# Patient Record
Sex: Female | Born: 1955
Health system: Southern US, Community
[De-identification: ages and names within clinical notes are randomized; demographics above are authoritative.]

## PROBLEM LIST (undated history)

## (undated) DIAGNOSIS — F4310 Post-traumatic stress disorder, unspecified: Secondary | ICD-10-CM

## (undated) DIAGNOSIS — F419 Anxiety disorder, unspecified: Secondary | ICD-10-CM

## (undated) DIAGNOSIS — R55 Syncope and collapse: Secondary | ICD-10-CM

## (undated) DIAGNOSIS — K5790 Diverticulosis of intestine, part unspecified, without perforation or abscess without bleeding: Secondary | ICD-10-CM

## (undated) DIAGNOSIS — I639 Cerebral infarction, unspecified: Secondary | ICD-10-CM

## (undated) DIAGNOSIS — M797 Fibromyalgia: Secondary | ICD-10-CM

## (undated) DIAGNOSIS — I10 Essential (primary) hypertension: Secondary | ICD-10-CM

## (undated) DIAGNOSIS — R51 Headache: Secondary | ICD-10-CM

## (undated) DIAGNOSIS — M549 Dorsalgia, unspecified: Secondary | ICD-10-CM

## (undated) DIAGNOSIS — M1990 Unspecified osteoarthritis, unspecified site: Secondary | ICD-10-CM

## (undated) DIAGNOSIS — G459 Transient cerebral ischemic attack, unspecified: Secondary | ICD-10-CM

## (undated) DIAGNOSIS — S83209A Unspecified tear of unspecified meniscus, current injury, unspecified knee, initial encounter: Secondary | ICD-10-CM

## (undated) DIAGNOSIS — E162 Hypoglycemia, unspecified: Secondary | ICD-10-CM

## (undated) DIAGNOSIS — E7800 Pure hypercholesterolemia, unspecified: Secondary | ICD-10-CM

## (undated) DIAGNOSIS — Z9621 Cochlear implant status: Secondary | ICD-10-CM

## (undated) DIAGNOSIS — G43909 Migraine, unspecified, not intractable, without status migrainosus: Secondary | ICD-10-CM

## (undated) DIAGNOSIS — M810 Age-related osteoporosis without current pathological fracture: Secondary | ICD-10-CM

## (undated) DIAGNOSIS — G8929 Other chronic pain: Secondary | ICD-10-CM

## (undated) DIAGNOSIS — K635 Polyp of colon: Secondary | ICD-10-CM

## (undated) DIAGNOSIS — R011 Cardiac murmur, unspecified: Secondary | ICD-10-CM

## (undated) DIAGNOSIS — K5900 Constipation, unspecified: Secondary | ICD-10-CM

## (undated) DIAGNOSIS — H532 Diplopia: Secondary | ICD-10-CM

## (undated) DIAGNOSIS — F209 Schizophrenia, unspecified: Secondary | ICD-10-CM

## (undated) DIAGNOSIS — N200 Calculus of kidney: Secondary | ICD-10-CM

## (undated) DIAGNOSIS — K5792 Diverticulitis of intestine, part unspecified, without perforation or abscess without bleeding: Secondary | ICD-10-CM

## (undated) DIAGNOSIS — R569 Unspecified convulsions: Secondary | ICD-10-CM

## (undated) HISTORY — DX: Diplopia: H53.2

## (undated) HISTORY — DX: Constipation, unspecified: K59.00

## (undated) HISTORY — PX: PARATHYROIDECTOMY: SHX19

## (undated) HISTORY — DX: Unspecified osteoarthritis, unspecified site: M19.90

## (undated) HISTORY — DX: Headache: R51

## (undated) HISTORY — DX: Pure hypercholesterolemia, unspecified: E78.00

## (undated) HISTORY — DX: Diverticulitis of intestine, part unspecified, without perforation or abscess without bleeding: K57.92

## (undated) HISTORY — DX: Unspecified tear of unspecified meniscus, current injury, unspecified knee, initial encounter: S83.209A

## (undated) HISTORY — DX: Cochlear implant status: Z96.21

## (undated) HISTORY — PX: DILATION AND CURETTAGE OF UTERUS: SHX78

## (undated) HISTORY — DX: Hypercalcemia: E83.52

## (undated) HISTORY — PX: CATARACT EXTRACTION: SUR2

## (undated) HISTORY — PX: OTHER SURGICAL HISTORY: SHX169

## (undated) HISTORY — DX: Syncope and collapse: R55

## (undated) HISTORY — PX: WISDOM TOOTH EXTRACTION: SHX21

## (undated) HISTORY — DX: Diverticulosis of intestine, part unspecified, without perforation or abscess without bleeding: K57.90

## (undated) HISTORY — PX: TONSILLECTOMY: SUR1361

## (undated) HISTORY — DX: Age-related osteoporosis without current pathological fracture: M81.0

## (undated) HISTORY — DX: Fibromyalgia: M79.7

## (undated) HISTORY — DX: Transient cerebral ischemic attack, unspecified: G45.9

## (undated) HISTORY — PX: KIDNEY STONE SURGERY: SHX686

## (undated) HISTORY — PX: HERNIA REPAIR: SHX51

## (undated) HISTORY — DX: Migraine, unspecified, not intractable, without status migrainosus: G43.909

## (undated) HISTORY — DX: Polyp of colon: K63.5

---

## 1983-03-01 HISTORY — PX: INGUINAL HERNIA REPAIR: SUR1180

## 1998-09-29 ENCOUNTER — Ambulatory Visit (HOSPITAL_BASED_OUTPATIENT_CLINIC_OR_DEPARTMENT_OTHER): Admission: RE | Admit: 1998-09-29 | Discharge: 1998-09-29 | Payer: Self-pay | Admitting: Orthopaedic Surgery

## 1998-10-02 ENCOUNTER — Emergency Department (HOSPITAL_COMMUNITY): Admission: EM | Admit: 1998-10-02 | Discharge: 1998-10-02 | Payer: Self-pay | Admitting: Emergency Medicine

## 1998-10-02 ENCOUNTER — Encounter: Payer: Self-pay | Admitting: Emergency Medicine

## 2004-07-01 ENCOUNTER — Encounter: Admission: RE | Admit: 2004-07-01 | Discharge: 2004-07-01 | Payer: Self-pay | Admitting: Neurology

## 2004-11-23 ENCOUNTER — Ambulatory Visit (HOSPITAL_COMMUNITY): Admission: RE | Admit: 2004-11-23 | Discharge: 2004-11-23 | Payer: Self-pay | Admitting: *Deleted

## 2004-11-23 ENCOUNTER — Encounter: Payer: Self-pay | Admitting: *Deleted

## 2005-11-18 ENCOUNTER — Other Ambulatory Visit (HOSPITAL_COMMUNITY): Admission: RE | Admit: 2005-11-18 | Discharge: 2006-02-16 | Payer: Self-pay | Admitting: Psychiatry

## 2005-11-18 ENCOUNTER — Ambulatory Visit: Payer: Self-pay | Admitting: Psychiatry

## 2006-02-20 ENCOUNTER — Emergency Department (HOSPITAL_COMMUNITY): Admission: EM | Admit: 2006-02-20 | Discharge: 2006-02-20 | Payer: Self-pay | Admitting: Emergency Medicine

## 2006-11-20 ENCOUNTER — Encounter: Admission: RE | Admit: 2006-11-20 | Discharge: 2006-11-20 | Payer: Self-pay | Admitting: Specialist

## 2007-01-04 ENCOUNTER — Ambulatory Visit (HOSPITAL_COMMUNITY): Admission: RE | Admit: 2007-01-04 | Discharge: 2007-01-04 | Payer: Self-pay | Admitting: Family Medicine

## 2007-06-28 ENCOUNTER — Observation Stay (HOSPITAL_COMMUNITY): Admission: AD | Admit: 2007-06-28 | Discharge: 2007-06-30 | Payer: Self-pay | Admitting: Gastroenterology

## 2007-10-11 ENCOUNTER — Ambulatory Visit (HOSPITAL_COMMUNITY): Admission: RE | Admit: 2007-10-11 | Discharge: 2007-10-11 | Payer: Self-pay | Admitting: Cardiology

## 2007-11-12 ENCOUNTER — Ambulatory Visit (HOSPITAL_COMMUNITY): Admission: RE | Admit: 2007-11-12 | Discharge: 2007-11-12 | Payer: Self-pay | Admitting: Oncology

## 2007-12-09 ENCOUNTER — Emergency Department (HOSPITAL_COMMUNITY): Admission: EM | Admit: 2007-12-09 | Discharge: 2007-12-09 | Payer: Self-pay | Admitting: Family Medicine

## 2008-01-27 ENCOUNTER — Emergency Department (HOSPITAL_COMMUNITY): Admission: EM | Admit: 2008-01-27 | Discharge: 2008-01-28 | Payer: Self-pay | Admitting: Emergency Medicine

## 2008-02-11 ENCOUNTER — Ambulatory Visit: Payer: Self-pay | Admitting: Diagnostic Radiology

## 2008-02-11 ENCOUNTER — Observation Stay (HOSPITAL_COMMUNITY): Admission: EM | Admit: 2008-02-11 | Discharge: 2008-02-15 | Payer: Self-pay | Admitting: Internal Medicine

## 2008-02-11 ENCOUNTER — Encounter: Payer: Self-pay | Admitting: Emergency Medicine

## 2008-02-12 ENCOUNTER — Encounter (INDEPENDENT_AMBULATORY_CARE_PROVIDER_SITE_OTHER): Payer: Self-pay | Admitting: Internal Medicine

## 2008-02-16 ENCOUNTER — Emergency Department (HOSPITAL_COMMUNITY): Admission: EM | Admit: 2008-02-16 | Discharge: 2008-02-16 | Payer: Self-pay | Admitting: Emergency Medicine

## 2008-02-29 HISTORY — PX: COCHLEAR IMPLANT: SUR684

## 2009-05-04 ENCOUNTER — Ambulatory Visit (HOSPITAL_COMMUNITY): Admission: RE | Admit: 2009-05-04 | Discharge: 2009-05-04 | Payer: Self-pay | Admitting: Psychiatry

## 2009-09-15 ENCOUNTER — Encounter: Admission: RE | Admit: 2009-09-15 | Discharge: 2009-09-15 | Payer: Self-pay | Admitting: Orthopedic Surgery

## 2009-09-25 ENCOUNTER — Emergency Department (HOSPITAL_COMMUNITY)
Admission: EM | Admit: 2009-09-25 | Discharge: 2009-09-25 | Payer: Self-pay | Source: Home / Self Care | Admitting: Emergency Medicine

## 2009-09-30 ENCOUNTER — Encounter: Admission: RE | Admit: 2009-09-30 | Discharge: 2009-09-30 | Payer: Self-pay | Admitting: Orthopedic Surgery

## 2009-10-13 ENCOUNTER — Encounter: Payer: Self-pay | Admitting: Interventional Cardiology

## 2009-11-16 ENCOUNTER — Encounter: Admission: RE | Admit: 2009-11-16 | Discharge: 2009-11-16 | Payer: Self-pay | Admitting: Gastroenterology

## 2009-12-09 ENCOUNTER — Encounter: Admission: RE | Admit: 2009-12-09 | Discharge: 2009-12-09 | Payer: Self-pay | Admitting: Neurology

## 2010-03-16 ENCOUNTER — Encounter
Admission: RE | Admit: 2010-03-16 | Discharge: 2010-03-16 | Payer: Self-pay | Source: Home / Self Care | Attending: Neurology | Admitting: Neurology

## 2010-03-21 ENCOUNTER — Encounter: Payer: Self-pay | Admitting: Orthopedic Surgery

## 2010-07-13 NOTE — H&P (Signed)
NAMELEONNA, SCHLEE                ACCOUNT NO.:  [id number]   MEDICAL RECORD NO.:  [id number]          PATIENT TYPE:  INP   LOCATION:  1432                         FACILITY:  [facility]   PHYSICIAN:  Michiel Cowboy, MDDATE OF BIRTH:  March 23, 1955   DATE OF ADMISSION:  02/11/2008  DATE OF DISCHARGE:                              HISTORY & PHYSICAL   PRIMARY CARE Keonia Pasko:  [name] with Deboraha Sprang at [facility].   Patient is a 55 year old female with a past medical history significant  for asthma, hypertension, anxiety, depression, hypercholesterolemia, and  kidney disease.  Patient was at her baseline and started to feel poorly  about 2 weeks ago.  She presented to her primary care doctor with  complaints of possibly losing weight and not feeling well.  They were  evaluating whether or not she was maybe diabetic.  Patient was endorsing  a lot of nausea and had a hard time eating.  Also having a lot of nausea  when she does not get to eat, which is normal for her.  She was in the  lobby after her appointment, and she wanted to be seen again, because  she states that she was feeling poorly.  While still in the lobby, she  synopsized.  Was brought back in.  Her blood sugar appeared to be  normal.  She was given food and did well.  Then on her way out,  synopsized again, at which point EMS was called, and she was brought  into the emergency department.  The patient was endorsing that over the  past 2 weeks or so, she had been having chest pain, which she states is  more like a pulled muscle.  She has also been having intermittent  shortness of breath episodes that come and go and some chest tightness.  It is short-lived.  She cannot think if they are associated at all with  activity or anything else.  She is currently chest painfree, though.  She has been having loose bowel movements for quite some time, she  thinks for months at a time but has it fairly infrequently every 2-3  days or so,  otherwise review of systems only significant for weight  loss, about 10 pounds, since September, otherwise unremarkable.  Patient  states that she might have pulled a muscle and has been having chest  pain on the left side ever since, which is worse with movement.   PAST MEDICAL HISTORY:  1. Asthma.  2. Hypertension.  3. Anxiety.  4. Depression.  5. Hypercholesterolemia.  6. History of kidney disease.  7. History of dissociative identity disorder.   ALLERGIES:  Patient is allergic to BENADRYL, CODEINE, SULFA, VICODIN,  and maybe some other medications that she cannot recall.   MEDICATIONS:  1. Ambien 10 mg at 11 p.m. and again at 3:00 in the morning.  2. Atacand 10/12.5 1-1/2 tablets once daily.  3. Albuterol sulfate as needed.  4. Advair 115/25 2 puffs twice daily.  5. Haldol 1 mg 1-1/2 tablets nightly.  6. Crestor 5 mg daily.  7. Fish oil.  8.  Magnesium.  9. Calcium.  10.Multivitamins.  11.Metoprolol 25 mg twice daily.  12.Ativan 0.5 1-1/2 tablets daily.  13.Temazepam 30 mg p.o. nightly as needed.   SOCIAL HISTORY:  Patient does not smoke.  Reportedly does not use drugs  but a former smoker.  Lives with her son.   FAMILY HISTORY:  Mother deceased of leukemia.  Father with high  cholesterol and bypass surgery.  Has some diabetes in her family.  A son  with bipolar disorder.   PHYSICAL EXAMINATION:  VITALS:  Temperature 98.4, blood pressure 159/64,  pulse 67, respirations 20, satting 97% on room air.  Patient appears to be in no acute distress but somewhat angry.  She is  not orthostatic.  Head atraumatic.  Somewhat dryish mucous membranes.  LUNGS:  Clear to auscultation bilaterally.  CHEST:  Patient has exquisite tenderness when pressed on her chest.  HEART:  Regular rate and rhythm.  No murmurs, rubs or gallops.  Somewhat  slow, though.  ABDOMEN:  Soft.  There is a slight left lower quadrant tenderness which  patient describes as chronic.  LOWER EXTREMITIES:   Without clubbing, cyanosis or edema.  NEUROLOGIC:  Intact.   White blood cell count 11.6, hemoglobin 12.3.  Sodium 145, potassium  3.6, creatinine 0.7.  Cardiac markers negative x2.  UA showing 7-10  white blood cells and 21-50 red blood cells.   Chest x-ray unremarkable.   CT scan of the head unremarkable.   EKG showing somewhat bradycardic but no evidence of ischemia or  infarction.   ASSESSMENT/PLAN:  This is a 55 year old female with a history of  depression, anxiety, and hypertension, who presents with vague symptoms  of occasional shortness of breath and chest pressure as well as  musculoskeletal continuous chest pain, which is worse with movement.  1. Chest pain:  The chest pain is likely more of musculoskeletal in      origin.  Will treat with trazodone and Tylenol as needed.  She has      occasional episodes of shortness of breath and chest tightness,      which may be somewhat more typical.  Will cycle cardiac enzymes.  2. Syncope:  This has proceeded with presyncope.  The patient had been      having some nausea and decreased p.o. intake, although she does not      appear to be orthostatic clinically, she does appear to be a little      bit on the dry side.  On telemetry so far, the patient is      bradycardic but in the high 50s.  Will continue to monitor.  Would      add a 2D echo to insure there is no cardiac pathology.  Give gentle      IV hydration.  3. Mild urinary tract infection versus hematuria:  Will repeat a UA      and meanwhile treat with Cipro.  Await urine culture.  4. History of an anxiety disorder and severe insomnia.  Will continue      home meds.  5. History of asthma:  May explain intermittent shortness of breath.      Will make sure she continues to take Advair and albuterol as      needed.  6. Prophylaxis:  Protonix, SCDs.      Michiel Cowboy, MD  Electronically Signed     AVD/MEDQ  D:  02/12/2008  T:  02/12/2008  Job:  [id number]    cc:  [name], NP

## 2010-07-13 NOTE — H&P (Signed)
Jasmine [name], Jasmine [name]                ACCOUNT NO.:  [id number]   MEDICAL RECORD NO.:  [id number]          PATIENT TYPE:  INP   LOCATION:  6703                         FACILITY:  [facility]   PHYSICIAN:  James L. Malon Kindle., M.D.DATE OF BIRTH:  09/03/1955   DATE OF ADMISSION:  06/28/2007  DATE OF DISCHARGE:                              HISTORY & PHYSICAL   REASON FOR ADMISSION:  Abdominal pain following a colonoscopy.   HISTORY:  A 55 year old who had a heme-positive stool and had a  colonoscopy yesterday by Dr. Dorena Cookey with 6 polyps removed from the  ascending, transverse, and descending colon with size noted to be 4-8  mm.  The patient went home, felt a little uncomfortable yesterday  evening, during the night, and this morning it got progressively worse  and localized to the left upper quadrant area.  She says it is now 8/10.  It hurts to move and go over bump.  She has not had any fever or chills.  She said she had a dark stool yesterday and only had a little bit tea  last night.  She presented back to the endo unit with these symptoms.  We brought her upstairs to have her checked.   CURRENT MEDICATIONS:  1. Haldol 1 mg nightly.  2. Benztropine 1 mg b.i.d.  3. Crestor 10 mg q.a.m.  4. Metoprolol ER 25 mg q.a.m.  5. Temazepam 15 mg 2 nightly.  6. Felodipine 10 mg nightly.  7. Atacand 16/12.5 mg q.a.m.  8. Advair b.i.d..  9. Xopenex p.r.n.  10.Ibuprofen p.r.n.   ALLERGIES:  She is allergic to CODEINE and BENADRYL, which causes her to  be nauseated and moody.   MEDICAL HISTORY:  Colonoscopy yesterday as noted.  She has history of  hypertension, asthma, heart murmur, depression, and migraine headaches.  She had a TIA in the distant past.  Surgery included tonsillectomy,  previous hernia repair.   FAMILY HISTORY:  Negative for colon cancer or polyps.   SOCIAL HISTORY:  She is unemployed, does not smoke or drink, and is  married.   PHYSICAL EXAM:  VITAL SIGNS:  Temperature  98.6, pulse 60, and blood  pressure 100/80.  GENERAL:  Alert, nonicteric white female.  She does appear to be in  moderate pain.  EYES:  Sclerae nonicteric.  LUNGS:  Clear.  HEART:  Regular rate and rhythm without murmurs or gallops.  ABDOMEN:  Slightly distended, is quiet with mild tenderness throughout,  with marked tenderness in the left upper quadrant.  The patient does  appear to have rebound tenderness.  No bowel sounds are present.  RECTAL:  Stool is brown and heme negative.   ASSESSMENT:  Pain following colonoscopy, may be a transmural burn or  early perforation.   PLAN:  We will admit and empirically give antibiotics and control her  symptoms with pain medications.  Obtain a CT with IV contrast to  evaluate for free air and perforation.           ______________________________  [name] Malon Kindle., M.D.     Waldron Session  D:  06/28/2007  T:  06/29/2007  Job:  [id number]   cc:   Molly Maduro L. Foy Guadalajara, M.D.  John C. Madilyn Fireman, M.D.

## 2010-07-16 NOTE — Discharge Summary (Signed)
NAMEAREATHA, KALATA                ACCOUNT NO.:  [id number]   MEDICAL RECORD NO.:  [id number]          PATIENT TYPE:  INP   LOCATION:  6703                         FACILITY:  [facility]   PHYSICIAN:  James L. Randa Evens, M.D. DATE OF BIRTH:  07-21-1955   DATE OF ADMISSION:  06/28/2007  DATE OF DISCHARGE:  06/30/2007                               DISCHARGE SUMMARY   ADMITTING DIAGNOSES:  Abdominal pain following colonoscopy.  The rest of  her admit diagnoses are consistent with her past medical history  including hypertension, asthma, heart murmur, depression, migraine  headaches, and a transient ischemic attack in the distant past.   DISCHARGE DIAGNOSES:  Abdominal pain following colonoscopy.  The rest of  her admit diagnoses are consistent with her past medical history  including hypertension, asthma, heart murmur, depression, migraine  headaches, and a transient ischemic attack in the distant past.   CONSULTS:  None.   PROCEDURES:  None.   Radiological exams, CT of her abdomen, pelvis done April 30, that showed  normal CT scan of the abdomen except for a tiny cyst at the upper pole  of the left kidney.  Negative CT scan of the pelvis.  There was no  perforation or microperforation in her colon.   BRIEF HISTORY AND PHYSICAL AND HOSPITAL COURSE:  This is a 56 year old  female who had a colonoscopy done April 29, for heme-positive stool.  She had 6 polyps removed from ascending, transverse, and descending  colon; sizes were noted to be between 4 and 8 mm.  The patient felt a  little uncomfortable during the evening.  She developed pain that got  progressively worse in the morning that localized to the left upper  quadrant.  She was seen on April 30 at [facility] in our office.  She had  not had any fever or chills.  She did have one dark stool.  She was  directly admitted to [facility].  On the day of her  hospitalization, she had a CT scan of her abdomen and pelvis, which was  negative.  She did have an elevated white count of 12.8.  Her hemoglobin  at time of admission was 13.6, hematocrit 39.4, platelets 264,000, CMET  was within normal limits.  A sed rate was 2.  She was treated with  antibiotics including Rocephin IV fluids and made n.p.o. overnight.  The  following morning her diet was advanced to full liquids.  She was still  having some pain and her white count had decreased.  She stayed in the  hospital one more day until she was able to advance her diet any solids.  She had brown stool without any bladder pain.  She was able to ambulate  well and was discharged to home in good condition on Jun 30, 2007.  The  patient was instructed to stay on a low-residue diet for few days.  She  has a followup appointment in the [facility] GI office with Dr. Dorena Cookey.   DISCHARGE MEDICATIONS:  Include,  1. Haldol 1 mg nightly.  2. Cogentin 1 mg b.i.d.  3. Crestor 10 mg q.a.m.  4. Metoprolol ER 25 mg q.a.m.  5. Temazepam 15 mg two nightly.  6. Felodipine 10 mg nightly.  7. Atacand 16/12.5 mg q.a.m.  8. Advair b.i.d.  9. Xopenex p.r.n.  10.Ibuprofen p.r.n.   Hemoglobin at time of discharge was 12.6, hematocrit 37.5, white count  10.4, and platelets 255,000.      Stephani Police, PA    ______________________________  [name] Randa Evens, M.D.    MLY/MEDQ  D:  07/05/2007  T:  07/06/2007  Job:  [id number]   cc:   Everardo All. Madilyn Fireman, M.D.  James L. Malon Kindle., M.D.

## 2010-07-16 NOTE — Discharge Summary (Signed)
Jasmine [name], [name]                ACCOUNT NO.:  [id number]   MEDICAL RECORD NO.:  [id number]          PATIENT TYPE:  INP   LOCATION:  1432                         FACILITY:  [facility]   PHYSICIAN:  Ramiro Harvest, MD    DATE OF BIRTH:  Aug 15, 1955   DATE OF ADMISSION:  02/11/2008  DATE OF DISCHARGE:  02/15/2008                               DISCHARGE SUMMARY   This patient's primary care physician is Jasmine [name] of [employer].   DISCHARGE DIAGNOSES:  1. Syncope.  2. Proteus mirabilis urinary tract infection.  3. Atypical chest pain likely musculoskeletal in nature.  4. Nausea and abdominal pain likely secondary to urinary tract      infection.  5. History of asthma.  6. History of anxiety disorder and severe insomnia.  7. Hypertension.  8. Depression.  9. Hyperlipidemia.  10.History of kidney disease.  11.History of dissociative identity disorder.   DISCHARGE MEDICATIONS:  1. Colace 100 mg p.o. b.i.d.  2. Laxative over-the-counter as needed for constipation.  3. MiraLax as needed for upset stomach.  4. Zofran 4 mg p.o. q.6 h. p.r.n. nausea.  5. Haldol 1 mg p.o. every night.  6. Skelaxin 400 mg 1-2 tablets p.o. q.4-6 h. p.r.n.  7. Atacand/HCT 10/12.5 mg, half a tablet p.o. daily.  8. Flonase 50 mcg 1 puff in each nostril daily.  9. Multivitamin 1 tablet daily.  10.Calcium as previously taken.  11.Magnesium as previously taken.  12.Fish oil 3 tablets daily.  13.Crestor 2.5 mg p.o. daily.  14.Xopenex 1 puff b.i.d.  15.Ambien 10 mg p.o. every night.  16.Metoprolol 25 mg p.o. daily.  17.Ativan 0.5 mg half a tablet p.o. p.r.n.  18.Temazepam 15 mg 2 tablets p.o. every night.   DISPOSITION AND FOLLOW-UP:  The patient will be discharged home.  The  patient is to follow up with her PCP, Jasmine [name], in 1 week post  discharge.   CONSULTATIONS DONE:  None.   PROCEDURES PERFORMED:  A CT of the head without contrast was done on  February 11, 2008, that showed no acute  intracranial abnormality.  Chest  x-ray was done on February 11, 2008, that showed no acute findings.   BRIEF HOSPITAL ADMISSION HISTORY AND PHYSICAL:  [name] is a 55-  year-old female with past medical history significant for asthma,  hypertension, depression, anxiety, hyperlipidemia and kidney disease who  was at her baseline and started to feel poorly.  Two weeks prior to  admission, the patient presented to her PCP's office with complaints of  possibly losing weight and not feeling well.  They were evaluating  whether or not she may be diabetic.  The patient was endorsing a lot of  nausea and had a hard time with p.o. intake.  The patient was also  having a lot of nausea when she was not able to eat which was normal for  her.  She was in lobby after her appointment, and she wanted to be seen  again because she stated that she was feeling poorly.  While still in  the lobby, she syncopized, was brought  back in.  Her blood sugars  appeared to be normal.  She was given food and did well.  Then, on the  way out, the patient had another syncopal episode at which point EMS was  called, and she was brought to the emergency department.  The patient  was endorsing that over the past 2 weeks or so she was she had been  having some chest pain which she stated was more like a pulled muscle.  She also had been having some intermittent shortness of breath episodes  that came and went with some chest tightness which was short lived.  The  patient could not think if they were associated at all with activity or  anything else.  The patient was currently chest pain free at the time of  the interview.  The patient had also been having some loose bowel  movements for quite awhile, and she was thinking for some months at the  time, but it was fairly frequent every 2-3 days.  Otherwise, review of  systems was only significant for weight loss of about 10 pounds since  September 2009, otherwise  unremarkable.  The patient stated that she  might have pulled a muscle and had been having some chest pain over the  left side ever since which was worse with movement.   PHYSICAL EXAMINATION:  Per admitting physician, temperature 98.4, blood  pressure 159/64, pulse of 67, respiratory rate 20, saturating 97% on  room air.  GENERAL:  The patient appeared to be in no acute distress but somewhat  angry.  The patient was not orthostatic.  HEAD:  Was atraumatic, somewhat dry mucous membranes.  RESPIRATORY:  Lungs were clear to auscultation bilaterally.  CARDIOVASCULAR:  Some exquisite tenderness to the chest on our  palpation.  CARDIOVASCULAR:  Regular rate and rhythm.  No murmurs, rubs or gallops.  ABDOMEN:  Was soft, slight left lower quadrant tenderness which the  patient described as chronic.  LOWER EXTREMITIES:  Without clubbing, cyanosis or edema.  NEUROLOGICALLY:  The patient was intact.   ADMISSION LABORATORY DATA:  CBC:  White count 11.6, hemoglobin 12.3.  Sodium 145, potassium 3.6, creatinine 0.7.  Point of care cardiac  markers were negative x2.  UA showed 7-10 white blood cells and 21-50  red blood cells.  Chest x-ray was unremarkable as stated above.  CT scan  of the head was unremarkable.  EKG was somewhat bradycardic but no  evidence of ischemia or infarct.   HOSPITAL COURSE:  1. Syncope.  The patient was admitted with syncope.  It was felt that      the patient had some presyncopal episodes.  The patient did have      some nausea and decreased p.o. intake.  It was felt the patient's      syncope was likely a combination of her dehydration and anxiety.      The patient was placed on telemetry.  She was monitored during the      hospitalization.  Cardiac enzymes were obtained which were negative      x3.  Head CT was obtained with results as stated above.  A TSH was      also obtained which was within normal limits.  A 2-D echocardiogram      was obtained on February 12, 2008, which showed normal left      ventricular systolic function, EF of 55-60%.  No evidence of left      ventricular regional wall motion  abnormalities.  The patient was      hydrated during the hospitalization and remained stable and had no      further episodes of syncope during the hospitalization.  The      patient was discharged in stable and improved condition to follow      up with PCP as stated.  2. Proteus mirabilis urinary tract infection.  The patient was noted      to have a urinary tract infection on admission.  Urine cultures      were sent which came out as 80,000 colonies of Proteus mirabilis.      The patient was initially placed on IV ciprofloxacin which the      Proteus mirabilis was sensitive to.  The patient's ciprofloxacin      was then changed to oral, and the patient had a total of a 3-day      course of antibiotics during the hospitalization.  The patient will      be discharged in stable condition.  3. Atypical chest pain.  The patient was noted to have a typical chest      pain; however, on exam, the patient's chest pain was exquisitely      tender to palpation.  It was felt this was likely musculoskeletal      in nature.  Cardiac enzymes were high and were cycled secondary to      problem #1 which came back negative.  A 2-D echocardiogram was also      obtained which was within normal limits.  The patient remained      asymptomatic for the rest of the hospitalization and will be      discharged in stable condition.  4. Nausea/abdominal pain, questionable etiology.  May have been      secondary to the patient's urinary tract infection.  Lipase levels      were obtained which came back at 31 within normal limits.  Amylase      level was also obtained at 62.  A comprehensive metabolic profile      was obtained with normal liver function tests.  The patient      improved symptomatically during the hospitalization, and the      patient was treated with  symptomatic treatment.  The patient will      be discharged home on Zofran as needed, and the patient was      discharged in stable condition.   On day of discharge, vital signs:  Temperature of 98.1, pulse of 73,  blood pressure 120/58, respirations 16, saturating 94% on room air.   DISCHARGE LABORATORY DATA:  Sodium of 140, potassium of 3.8, chloride  108, bicarb 28, glucose 93, BUN 8, creatinine 0.64, and calcium of 9.2.  CBC with a white count of 8.2, hemoglobin of 10.9, hematocrit of 32.2,  platelet count of 188.   It was a pleasure taking care of [name].      Ramiro Harvest, MD  Electronically Signed     DT/MEDQ  D:  03/31/2008  T:  03/31/2008  Job:  [id number]   cc:   Jasmine [name], M.D.  [facility]

## 2010-07-26 ENCOUNTER — Emergency Department (HOSPITAL_COMMUNITY): Payer: BC Managed Care – PPO

## 2010-07-26 ENCOUNTER — Inpatient Hospital Stay (HOSPITAL_COMMUNITY)
Admission: EM | Admit: 2010-07-26 | Discharge: 2010-07-29 | DRG: 009 | Disposition: A | Discharge status: Home Health | Payer: BC Managed Care – PPO | Attending: General Surgery | Admitting: General Surgery

## 2010-07-26 DIAGNOSIS — G4730 Sleep apnea, unspecified: Secondary | ICD-10-CM | POA: Diagnosis present

## 2010-07-26 DIAGNOSIS — S060X9A Concussion with loss of consciousness of unspecified duration, initial encounter: Secondary | ICD-10-CM | POA: Diagnosis present

## 2010-07-26 DIAGNOSIS — Y998 Other external cause status: Secondary | ICD-10-CM

## 2010-07-26 DIAGNOSIS — Y9229 Other specified public building as the place of occurrence of the external cause: Secondary | ICD-10-CM

## 2010-07-26 DIAGNOSIS — M4712 Other spondylosis with myelopathy, cervical region: Secondary | ICD-10-CM | POA: Diagnosis present

## 2010-07-26 DIAGNOSIS — E785 Hyperlipidemia, unspecified: Secondary | ICD-10-CM | POA: Diagnosis present

## 2010-07-26 DIAGNOSIS — S14101A Unspecified injury at C1 level of cervical spinal cord, initial encounter: Principal | ICD-10-CM | POA: Diagnosis present

## 2010-07-26 DIAGNOSIS — R5381 Other malaise: Secondary | ICD-10-CM | POA: Diagnosis present

## 2010-07-26 DIAGNOSIS — G8190 Hemiplegia, unspecified affecting unspecified side: Secondary | ICD-10-CM | POA: Diagnosis present

## 2010-07-26 DIAGNOSIS — R4701 Aphasia: Secondary | ICD-10-CM | POA: Diagnosis present

## 2010-07-26 DIAGNOSIS — Y9318 Activity, surfing, windsurfing and boogie boarding: Secondary | ICD-10-CM

## 2010-07-26 DIAGNOSIS — I10 Essential (primary) hypertension: Secondary | ICD-10-CM | POA: Diagnosis present

## 2010-07-26 DIAGNOSIS — X58XXXA Exposure to other specified factors, initial encounter: Secondary | ICD-10-CM

## 2010-07-26 LAB — COMPREHENSIVE METABOLIC PANEL
ALT: 15 U/L (ref 0–35)
AST: 18 U/L (ref 0–37)
Albumin: 4.4 g/dL (ref 3.5–5.2)
CO2: 27 mEq/L (ref 19–32)
Calcium: 10.5 mg/dL (ref 8.4–10.5)
GFR calc Af Amer: >60 mL/min (ref >60)
GFR calc non Af Amer: >60 mL/min (ref >60)
Sodium: 140 mEq/L (ref 135–145)
Total Protein: 6.9 g/dL (ref 6.0–8.3)

## 2010-07-26 LAB — CBC
HCT: 39.7 % (ref 36.0–46.0)
MCH: 29.3 pg (ref 26.0–34.0)
MCV: 86.1 fL (ref 78.0–100.0)
Platelets: 215 10*3/uL (ref 150–400)
RBC: 4.61 MIL/uL (ref 3.87–5.11)

## 2010-07-26 LAB — TROPONIN I: Troponin I: <0.3 ng/mL (ref <0.30)

## 2010-07-26 LAB — CK TOTAL AND CKMB (NOT AT ARMC): CK, MB: 2.2 ng/mL (ref 0.3–4.0)

## 2010-07-27 NOTE — Consult Note (Signed)
Jasmine [name]                ACCOUNT NO.:  [id number]  MEDICAL RECORD NO.:  [id number]           PATIENT TYPE:  O  LOCATION:  3316                         FACILITY:  [facility]  PHYSICIAN:  Danae Orleans. Venetia Maxon, M.D.  DATE OF BIRTH:  Jul 17, 1955  DATE OF CONSULTATION:  07/26/2010 DATE OF DISCHARGE:                                CONSULTATION   REASON FOR CONSULTATION:  Left-sided weakness after water slide accident.  HISTORY OF PRESENT ILLNESS:  Jasmine [name] is a 55 year old woman with cochlear implant who fell in a water slide, flipped her tube and struck her head.  She had positive loss of consciousness.  She came to the [facility] Emergency Room with left arm and leg weakness.  She was intially also complained of some neck pain.  She is amnestic to events surrounding injury.  She denies any right-sided weakness.  Her head CT was negative apart from left cochlear implant.  The patient's C- spine CT shows spondylosis, stenosis, most marked at C5-6 but to a lesser degree at C4-5 and C6-7 levels without fracture.  She has Scheuermann disease in her mid to lower thoracic spine.  No fractures in cervicothoracic spine.  PAST MEDICAL HISTORY:  Significant for cochlear implant, anxiety, elevated cholesterol, hypertension and kidney stone.  She is not currently on any scheduled medications.  ALLERGIES:  BENADRYL, CODEINE, SULFA and VICODIN.  On examination, temperature is 99.3, pulse is 71, respiratory rate 18, blood pressure is 140/95.  Cranial nerves intact apart from that she is hard-of-hearing.  She has some tenderness to palpation over posterior cervical spine.  No soft tissue swelling or step-offs.  Pupils are equal, round, and reactive to light.  Extraocular movements are intact. Facial, motor and sensation are intact and symmetric.  She does have a left hemiparesis which is rapidly improving per Dr. Orvan Falconer who came here on multiple occasions but she is still weak in  her left deltoid and hand intrinsics, mildly weak in her left biceps and triceps.  Her leg appears to be improving and she has fairly symmetric strength in her left leg compared to the right.  Reflexes are brisk throughout, 3 in the biceps, triceps with positive Hoffman sign, 3 at the knees, 3 at the ankles.  Great toes are downgoing to plantar stimulation.  She notes some decreased sensation in the left side of her body in her arm and leg, but it is intact in her face.  IMPRESSION:  Jasmine [name] is a 55 year old woman with cervical stenosis and spondylosis with a transient spinal cord injury.  She has a history of gait disorder and I suspect an underlying myelopathy.  She is currently hyperreflexic in her upper and lower extremities.  We are unable to get an MRI of her cervical spine because of cochlear implant. She will need to be observed with frequent neuro checks and will need late cervical spinal decompression.  Per my close review of the CT scan, there does not appear to be a soft tissue injury such as disk herniation, but I think that the risks of myelography exceeds the potential benefits to the  patient.  Assuming that she continues to make a complete recovery in her neurologic function, she will need to wear a cervical collar, will follow up with me in my office and that she will then undergo.  We will discuss late surgical cervical spinal cord decompression.  I recommended she be treated with Decadron 4 mg t.i.d. which will likely help decrease cord edema.     Danae Orleans. Venetia Maxon, M.D.     JDS/MEDQ  D:  07/26/2010  T:  07/27/2010  Job:  [id number]  Electronically Signed by Maeola Harman M.D. on 07/27/2010 01:27:13 PM

## 2010-08-16 NOTE — H&P (Signed)
Jasmine [name], Jasmine [name]                ACCOUNT NO.:  [id number]  MEDICAL RECORD NO.:  [id number]           PATIENT TYPE:  O  LOCATION:  3316                         FACILITY:  [facility]  PHYSICIAN:  Lennie Muckle, MD      DATE OF BIRTH:  1955-10-10  DATE OF ADMISSION:  07/26/2010 DATE OF DISCHARGE:                             HISTORY & PHYSICAL   CHIEF COMPLAINT:  Weakness; is a gold trauma.  Jasmine [name] is a 55 year old female who [place].  Apparently, she had a positive loss of consciousness, has had weakness on the left side.  She came in as a level II trauma, but was upgraded due to the significant amount of weakness on the left side, noted by Dr. Patria Mane.  Her imaging studies were negative for the CT as well as MRI for any acute injury.  She was noted to have some stenosis of the C-spine.  PAST MEDICAL HISTORY:  Hypertension, sleep apnea, and hearing impairment.  SURGICAL HISTORY:  Negative.  No tobacco or alcohol use.  She lives with her husband.  ALLERGIES:  SULFA and CODEINE.  Medications include Ambien, Restoril, Crestor, metoprolol.  REVIEW OF SYSTEMS:  Negative.  PHYSICAL EXAMINATION:  GENERAL:  She is lying in a stretcher and is in a C-collar, is in no acute distress. VITAL SIGNS:  Temperature 99.3 on arrival in the emergency department, pulse 71, respiratory rate 18, blood pressure 140/95, O2 sats 99%. Currently, she has been stable during her entire time in the emergency department. HEENT:  Head is normocephalic, atraumatic.  Extraocular muscles are intact.  Pupils are equal, round, and reactive to light.  Nares are clear without drainage.  Oral mucosa is pink and moist.  Dentition is good.  Midface is stable without tenderness.  Head is without tenderness.  C-spine does have tenderness in the mid region.  No step- offs are palpated.  The trachea is midline.  The thyroid without palpable abnormalities. CHEST:  Clear to  auscultation bilaterally.  Normal expansion and excursion.  No crepitus is felt. CARDIOVASCULAR:  Regular rate and rhythm.  No murmurs, gallops, or rubs. Pulses palpated in upper and lower extremity.  SKIN:  No rashes, no jaundice.  No abrasions are noted. ABDOMEN:  Soft, nontender, nondistended.  No organomegaly.  No masses. Pelvis is stable. MUSCULOSKELETAL:  No deformities are seen.  No pain with palpation in the joints. NEUROLOGICAL:  She is alert and oriented x3, answers questions appropriately.  She has only slightly diminished grip strength in the left hand versus the right.  She is able to raise both extremities.  She is approximately 4.5/5 on the left and 5 on the right.  Leg raise on the left is slightly diminished versus the right.  It is 4/5 on the left and 5/5 on the right.  She has some tingling noted in her left lower extremity. BACK:  She has some tenderness in the mid thoracic region.  Serum chemistries are all within normal limits.  Chest x-ray negative.  Pelvis negative.  Head CT negative for acute abnormalities.  C-spine  reveals C3-5 spondylosis with some stenosis. The CT of the thoracic spine and lumbar reveal disk disease degeneration at L3-4.  ASSESSMENT AND PLAN:  Status post spinal cord trauma mostly due to stenosis and narrowing and some spinal shock.  The patient has been seen by Dr. Venetia Maxon who feels that she could likely be managed nonoperatively for the present time.  Likely, she will need operative management in about a week or so.  We will admit for neurological examination in the step-down unit, go ahead and start some oral steroids and likely be able to discharge to home tomorrow with the C-collar.  She is ambulating with full recovery of neurological deficits.  We will cover with Protonix for GI.  Continue all of her home medications for hypertension.  SCDs for DVT prophylaxis.     Lennie Muckle, MD     ALA/MEDQ  D:  07/26/2010  T:   07/27/2010  Job:  [id number]  cc:   Violeta Gelinas  Electronically Signed by Bertram Savin MD on 08/16/2010 12:16:09 PM

## 2010-08-31 NOTE — Discharge Summary (Signed)
Jasmine [name], Jasmine [name]                ACCOUNT NO.:  [id number]  MEDICAL RECORD NO.:  [id number]           PATIENT TYPE:  I  LOCATION:  3034                         FACILITY:  [facility]  PHYSICIAN:  Cherylynn Ridges, M.D.    DATE OF BIRTH:  1955/12/15  DATE OF ADMISSION:  07/26/2010 DATE OF DISCHARGE:  07/28/2010                              DISCHARGE SUMMARY   The patient discharged to home with home health PT and speech and followup.  CONSULTANTS:  Danae Orleans. Venetia Maxon, MD, Neurosurgery.  DISCHARGE DIAGNOSES: 1. Water slide accident. 2. Cervical spinal cord transient injury/stinger. 3. Concussion with brief loss of consciousness.  PAST MEDICAL HISTORY: 1. She has a left cochlear implant. 2. Anxiety. 3. Hyperlipidemia. 4. Hypertension. 5. History of kidney stones. 6. History of cervical stenosis and spondylosis with apparent history     of a myelopathy was some gait disorder premorbidly. 7. Sleep apnea.  PROCEDURES:  None.  HISTORY:  This is a 55 year old Caucasian female who was riding apparently on a water slide ride at a local water park when she flipped off of a tube and struck her head.  She did have a brief loss of consciousness.  She was brought to [facility] Emergency Room as a level I trauma alert complaining of left arm and leg weakness and neck pain. She was amnestic to the events surrounding the injury as well.  Head CT scan was negative apart from a left cochlear implant.  CT scan of the cervical spine showed spondylosis and stenosis, most marked at C5-6 but to a lesser degree at levels above and below this without clear fracture.  On exam, she had weakness consistent with more of left hemiparesis and some sensory changes on that side.  This was improving in the emergency room and continued to improve throughout the patient's hospital stay.  She was started on Decadron orally and hospitalized for observation and mobilization.  She did extremely well with this  and reports that her weakness and her sensory changes on the left side have essentially resolved.  She did very well with physical therapy on the Berg balance test, but we are going to have physical therapy going for a safety evaluation in the home.  She underwent assessment by speech therapy as well and it is recommended that it is safe to be discharged home alone, but they do recommend some home health followup and this has been arranged.  The patient is going to follow up with Dr. Venetia Maxon on June 11 at 10:00 a.m. as it was felt that she will require a late cervical spine decompression given her stenosis with spondylosis with history of a gait disorder and myelopathy.  The patient was otherwise doing well, tolerating a regular diet and taking oral pain medications on a p.r.n. basis.  DIET:  Regular.  MEDICATIONS AT DISCHARGE:  Tylenol as needed for pain or oxycodone 5 mg tablets 5-10 mg p.o. q.4 h p.r.n. pain, #60 no refill.  She will continue on her usual home medications of: 1. Advair Diskus 1 puff b.i.d. that is 250/50 dose. 2. Ambien 10-20 mg p.o. nightly.  3. Atacand 16 mg 1/2 tablet p.o. q.p.m. 4. Crestor 20 mg p.o. b.i.d. 5. Haldol 1 mg p.o. nightly. 6. Metoprolol 25 mg p.o. q.p.m. 7. Restoril 15 mg 1-2 p.o. nightly p.r.n. 8. Xopenex inhaler 2 puffs q.6 h p.r.n.  The patient will follow up with Trauma Service on an as-needed basis, but again is going to follow up with Dr. Venetia Maxon on June 11 at 10:00 a.m. or sooner should she have difficulties in the interim.  She is to wear her cervical neck brace as instructed at all times.     Lazaro Arms, P.A.   ______________________________ Cherylynn Ridges, M.D.    SR/MEDQ  D:  07/28/2010  T:  07/28/2010  Job:  [id number]  cc:   Danae Orleans. Venetia Maxon, M.D.  Electronically Signed by Lazaro Arms P.A. on 08/17/2010 07:01:11 PM Electronically Signed by Jimmye Norman M.D. on 08/31/2010 08:13:20 AM

## 2010-09-22 DIAGNOSIS — J302 Other seasonal allergic rhinitis: Secondary | ICD-10-CM | POA: Insufficient documentation

## 2010-09-22 DIAGNOSIS — J4520 Mild intermittent asthma, uncomplicated: Secondary | ICD-10-CM

## 2010-09-22 DIAGNOSIS — G4700 Insomnia, unspecified: Secondary | ICD-10-CM

## 2010-09-22 DIAGNOSIS — E7800 Pure hypercholesterolemia, unspecified: Secondary | ICD-10-CM | POA: Insufficient documentation

## 2010-09-22 HISTORY — DX: Insomnia, unspecified: G47.00

## 2010-09-22 HISTORY — DX: Mild intermittent asthma, uncomplicated: J45.20

## 2010-10-29 ENCOUNTER — Emergency Department (HOSPITAL_COMMUNITY): Payer: BC Managed Care – PPO

## 2010-10-29 ENCOUNTER — Emergency Department (HOSPITAL_COMMUNITY)
Admission: EM | Admit: 2010-10-29 | Discharge: 2010-10-30 | Disposition: A | Discharge status: Home / Self Care | Payer: BC Managed Care – PPO | Attending: Emergency Medicine | Admitting: Emergency Medicine

## 2010-10-29 DIAGNOSIS — E7800 Pure hypercholesterolemia, unspecified: Secondary | ICD-10-CM | POA: Insufficient documentation

## 2010-10-29 DIAGNOSIS — F329 Major depressive disorder, single episode, unspecified: Secondary | ICD-10-CM | POA: Insufficient documentation

## 2010-10-29 DIAGNOSIS — F4310 Post-traumatic stress disorder, unspecified: Secondary | ICD-10-CM | POA: Insufficient documentation

## 2010-10-29 DIAGNOSIS — J45909 Unspecified asthma, uncomplicated: Secondary | ICD-10-CM | POA: Insufficient documentation

## 2010-10-29 DIAGNOSIS — F3289 Other specified depressive episodes: Secondary | ICD-10-CM | POA: Insufficient documentation

## 2010-10-29 DIAGNOSIS — F4481 Dissociative identity disorder: Secondary | ICD-10-CM | POA: Insufficient documentation

## 2010-10-29 DIAGNOSIS — F411 Generalized anxiety disorder: Secondary | ICD-10-CM | POA: Insufficient documentation

## 2010-10-29 DIAGNOSIS — I10 Essential (primary) hypertension: Secondary | ICD-10-CM | POA: Insufficient documentation

## 2010-10-29 LAB — DIFFERENTIAL
Basophils Absolute: 0 10*3/uL (ref 0.0–0.1)
Lymphocytes Relative: 27 % (ref 12–46)
Lymphs Abs: 3 10*3/uL (ref 0.7–4.0)
Neutro Abs: 7.3 10*3/uL (ref 1.7–7.7)
Neutrophils Relative %: 66 % (ref 43–77)

## 2010-10-29 LAB — CBC
HCT: 41.1 % (ref 36.0–46.0)
MCV: 85.1 fL (ref 78.0–100.0)
RBC: 4.83 MIL/uL (ref 3.87–5.11)
WBC: 11 10*3/uL — ABNORMAL HIGH (ref 4.0–10.5)

## 2010-10-29 LAB — SALICYLATE LEVEL: Salicylate Lvl: <2 mg/dL — ABNORMAL LOW (ref 2.8–20.0)

## 2010-10-29 LAB — POCT I-STAT, CHEM 8
BUN: 11 mg/dL (ref 6–23)
Chloride: 110 mEq/L (ref 96–112)
Creatinine, Ser: 0.7 mg/dL (ref 0.50–1.10)
Glucose, Bld: 88 mg/dL (ref 70–99)
Potassium: 3.4 mEq/L — ABNORMAL LOW (ref 3.5–5.1)
Sodium: 142 mEq/L (ref 135–145)

## 2010-10-29 LAB — ETHANOL: Alcohol, Ethyl (B): <11 mg/dL (ref 0–11)

## 2010-11-23 LAB — COMPREHENSIVE METABOLIC PANEL
ALT: 15
Albumin: 4
Alkaline Phosphatase: 46
BUN: 7
CO2: 28
GFR calc non Af Amer: >60
Glucose, Bld: 90
Potassium: 3.6
Sodium: 141
Total Protein: 6.3

## 2010-11-23 LAB — CBC
HCT: 39.4
Hemoglobin: 13.6
MCHC: 34.5
RBC: 4.57
RDW: 13.6

## 2010-11-23 LAB — DIFFERENTIAL
Basophils Absolute: 0
Basophils Relative: 0
Eosinophils Absolute: 0.1
Monocytes Relative: 4
Neutro Abs: 9.2 — ABNORMAL HIGH
Neutrophils Relative %: 76

## 2010-11-23 LAB — SEDIMENTATION RATE: Sed Rate: 2

## 2010-11-30 LAB — BASIC METABOLIC PANEL
CO2: 28
Calcium: 10.3
Chloride: 105
GFR calc Af Amer: >60
Glucose, Bld: 183 — ABNORMAL HIGH
Potassium: 3.7
Sodium: 141

## 2010-11-30 LAB — DIFFERENTIAL
Basophils Relative: 0
Eosinophils Relative: 1
Monocytes Absolute: 0.4
Monocytes Relative: 3
Neutro Abs: 7

## 2010-11-30 LAB — CBC
HCT: 40.8
Hemoglobin: 13.6
MCHC: 33.4
RBC: 4.6
RDW: 13.7

## 2010-11-30 LAB — RAPID URINE DRUG SCREEN, HOSP PERFORMED
Barbiturates: NOT DETECTED
Opiates: NOT DETECTED

## 2010-12-03 LAB — CBC
HCT: 35.6 % — ABNORMAL LOW (ref 36.0–46.0)
Hemoglobin: 11.2 g/dL — ABNORMAL LOW (ref 12.0–15.0)
Hemoglobin: 11.8 g/dL — ABNORMAL LOW (ref 12.0–15.0)
Hemoglobin: 12.7 g/dL (ref 12.0–15.0)
Hemoglobin: 12.3 g/dL (ref 12.0–15.0)
MCHC: 33 g/dL (ref 30.0–36.0)
MCHC: 33.2 g/dL (ref 30.0–36.0)
MCHC: 33.3 g/dL (ref 30.0–36.0)
MCHC: 33.3 g/dL (ref 30.0–36.0)
MCHC: 33.9 g/dL (ref 30.0–36.0)
MCHC: 33.2 g/dL (ref 30.0–36.0)
MCV: 88.5 fL (ref 78.0–100.0)
MCV: 89.2 fL (ref 78.0–100.0)
MCV: 89.9 fL (ref 78.0–100.0)
MCV: 86.7 fL (ref 78.0–100.0)
Platelets: 188 10*3/uL (ref 150–400)
Platelets: 207 10*3/uL (ref 150–400)
RBC: 3.74 MIL/uL — ABNORMAL LOW (ref 3.87–5.11)
RBC: 4.32 MIL/uL (ref 3.87–5.11)
RBC: 4.26 MIL/uL (ref 3.87–5.11)
RDW: 14.3 % (ref 11.5–15.5)
WBC: 11.2 10*3/uL — ABNORMAL HIGH (ref 4.0–10.5)
WBC: 8.1 10*3/uL (ref 4.0–10.5)

## 2010-12-03 LAB — URINALYSIS, ROUTINE W REFLEX MICROSCOPIC
Bilirubin Urine: NEGATIVE
Bilirubin Urine: NEGATIVE
Bilirubin Urine: NEGATIVE
Glucose, UA: NEGATIVE mg/dL
Glucose, UA: NEGATIVE mg/dL
Glucose, UA: NEGATIVE mg/dL
Hgb urine dipstick: NEGATIVE
Ketones, ur: NEGATIVE mg/dL
Protein, ur: NEGATIVE mg/dL
Protein, ur: NEGATIVE mg/dL
pH: 7 (ref 5.0–8.0)

## 2010-12-03 LAB — DIFFERENTIAL
Basophils Relative: 0 % (ref 0–1)
Basophils Relative: 0 % (ref 0–1)
Basophils Relative: 1 % (ref 0–1)
Eosinophils Absolute: 0.1 10*3/uL (ref 0.0–0.7)
Eosinophils Relative: 1 % (ref 0–5)
Lymphocytes Relative: 19 % (ref 12–46)
Lymphocytes Relative: 26 % (ref 12–46)
Lymphs Abs: 1.8 10*3/uL (ref 0.7–4.0)
Lymphs Abs: 2.1 10*3/uL (ref 0.7–4.0)
Lymphs Abs: 2.2 10*3/uL (ref 0.7–4.0)
Lymphs Abs: 3.1 10*3/uL (ref 0.7–4.0)
Monocytes Absolute: 0.2 10*3/uL (ref 0.1–1.0)
Monocytes Absolute: 0.4 10*3/uL (ref 0.1–1.0)
Monocytes Absolute: 0.4 10*3/uL (ref 0.1–1.0)
Monocytes Relative: 3 % (ref 3–12)
Monocytes Relative: 3 % (ref 3–12)
Monocytes Relative: 5 % (ref 3–12)
Monocytes Relative: 6 % (ref 3–12)
Neutro Abs: 4.1 10*3/uL (ref 1.7–7.7)
Neutro Abs: 5.2 10*3/uL (ref 1.7–7.7)
Neutro Abs: 8.5 10*3/uL — ABNORMAL HIGH (ref 1.7–7.7)
Neutro Abs: 7.9 10*3/uL — ABNORMAL HIGH (ref 1.7–7.7)
Neutrophils Relative %: 69 % (ref 43–77)
Neutrophils Relative %: 76 % (ref 43–77)

## 2010-12-03 LAB — POCT CARDIAC MARKERS
CKMB, poc: <1 ng/mL — ABNORMAL LOW (ref 1.0–8.0)
Myoglobin, poc: 32.5 ng/mL (ref 12–200)

## 2010-12-03 LAB — HEPATIC FUNCTION PANEL
ALT: 44 U/L — ABNORMAL HIGH (ref 0–35)
AST: 32 U/L (ref 0–37)
Albumin: 3.9 g/dL (ref 3.5–5.2)
Total Bilirubin: 0.6 mg/dL (ref 0.3–1.2)

## 2010-12-03 LAB — COMPREHENSIVE METABOLIC PANEL
AST: 18 U/L (ref 0–37)
Albumin: 3.7 g/dL (ref 3.5–5.2)
BUN: 8 mg/dL (ref 6–23)
Calcium: 9.4 mg/dL (ref 8.4–10.5)
Calcium: 9.5 mg/dL (ref 8.4–10.5)
Creatinine, Ser: 0.69 mg/dL (ref 0.4–1.2)
GFR calc Af Amer: >60 mL/min (ref >60)
Glucose, Bld: 107 mg/dL — ABNORMAL HIGH (ref 70–99)
Sodium: 141 mEq/L (ref 135–145)
Sodium: 143 mEq/L (ref 135–145)
Total Protein: 5.3 g/dL — ABNORMAL LOW (ref 6.0–8.3)
Total Protein: 5.8 g/dL — ABNORMAL LOW (ref 6.0–8.3)

## 2010-12-03 LAB — BASIC METABOLIC PANEL
BUN: 8 mg/dL (ref 6–23)
CO2: 27 mEq/L (ref 19–32)
CO2: 28 mEq/L (ref 19–32)
CO2: 28 mEq/L (ref 19–32)
Calcium: 9.1 mg/dL (ref 8.4–10.5)
Calcium: 9.8 mg/dL (ref 8.4–10.5)
Chloride: 109 mEq/L (ref 96–112)
Chloride: 110 mEq/L (ref 96–112)
Creatinine, Ser: 0.64 mg/dL (ref 0.4–1.2)
Creatinine, Ser: 0.7 mg/dL (ref 0.4–1.2)
GFR calc Af Amer: >60 mL/min (ref >60)
GFR calc Af Amer: >60 mL/min (ref >60)
GFR calc Af Amer: >60 mL/min (ref >60)
GFR calc non Af Amer: >60 mL/min (ref >60)
GFR calc non Af Amer: >60 mL/min (ref >60)
Glucose, Bld: 93 mg/dL (ref 70–99)
Potassium: 3.8 mEq/L (ref 3.5–5.1)
Sodium: 139 mEq/L (ref 135–145)
Sodium: 142 mEq/L (ref 135–145)
Sodium: 145 mEq/L (ref 135–145)

## 2010-12-03 LAB — CARDIAC PANEL(CRET KIN+CKTOT+MB+TROPI)
CK, MB: 1.1 ng/mL (ref 0.3–4.0)
Relative Index: INVALID (ref 0.0–2.5)
Relative Index: INVALID (ref 0.0–2.5)
Total CK: 62 U/L (ref 7–177)
Total CK: 67 U/L (ref 7–177)
Troponin I: <0.01 ng/mL (ref 0.00–0.06)
Troponin I: <0.01 ng/mL (ref 0.00–0.06)

## 2010-12-03 LAB — URINE MICROSCOPIC-ADD ON

## 2010-12-03 LAB — LIPID PANEL
Cholesterol: 186 mg/dL (ref 0–200)
HDL: 47 mg/dL (ref >39)
LDL Cholesterol: 131 mg/dL — ABNORMAL HIGH (ref 0–99)
Triglycerides: 41 mg/dL (ref <150)

## 2010-12-03 LAB — URINE CULTURE

## 2010-12-03 LAB — TSH: TSH: 1.51 u[IU]/mL (ref 0.350–4.500)

## 2010-12-03 LAB — D-DIMER, QUANTITATIVE: D-Dimer, Quant: <0.22 ug/mL-FEU (ref 0.00–0.48)

## 2011-07-07 ENCOUNTER — Emergency Department (HOSPITAL_COMMUNITY): Payer: BC Managed Care – PPO

## 2011-07-07 ENCOUNTER — Emergency Department (HOSPITAL_COMMUNITY)
Admission: EM | Admit: 2011-07-07 | Discharge: 2011-07-07 | Disposition: A | Discharge status: Home / Self Care | Payer: BC Managed Care – PPO | Attending: Emergency Medicine | Admitting: Emergency Medicine

## 2011-07-07 ENCOUNTER — Encounter (HOSPITAL_COMMUNITY): Payer: Self-pay | Admitting: *Deleted

## 2011-07-07 DIAGNOSIS — R410 Disorientation, unspecified: Secondary | ICD-10-CM

## 2011-07-07 DIAGNOSIS — F29 Unspecified psychosis not due to a substance or known physiological condition: Secondary | ICD-10-CM | POA: Insufficient documentation

## 2011-07-07 DIAGNOSIS — R4182 Altered mental status, unspecified: Secondary | ICD-10-CM | POA: Insufficient documentation

## 2011-07-07 DIAGNOSIS — R079 Chest pain, unspecified: Secondary | ICD-10-CM | POA: Insufficient documentation

## 2011-07-07 HISTORY — DX: Unspecified convulsions: R56.9

## 2011-07-07 LAB — COMPREHENSIVE METABOLIC PANEL
ALT: 14 U/L (ref 0–35)
AST: 18 U/L (ref 0–37)
Albumin: 4.3 g/dL (ref 3.5–5.2)
Alkaline Phosphatase: 62 U/L (ref 39–117)
Glucose, Bld: 125 mg/dL — ABNORMAL HIGH (ref 70–99)
Potassium: 4.2 mEq/L (ref 3.5–5.1)
Sodium: 144 mEq/L (ref 135–145)
Total Protein: 6.6 g/dL (ref 6.0–8.3)

## 2011-07-07 LAB — DIFFERENTIAL
Basophils Absolute: 0 10*3/uL (ref 0.0–0.1)
Basophils Relative: 0 % (ref 0–1)
Eosinophils Absolute: 0.1 10*3/uL (ref 0.0–0.7)
Lymphocytes Relative: 15 % (ref 12–46)
Neutrophils Relative %: 81 % — ABNORMAL HIGH (ref 43–77)

## 2011-07-07 LAB — CBC
Hemoglobin: 13.3 g/dL (ref 12.0–15.0)
MCHC: 34 g/dL (ref 30.0–36.0)

## 2011-07-07 LAB — RAPID URINE DRUG SCREEN, HOSP PERFORMED
Amphetamines: NOT DETECTED
Barbiturates: NOT DETECTED
Benzodiazepines: POSITIVE — AB
Cocaine: NOT DETECTED

## 2011-07-07 LAB — URINALYSIS, ROUTINE W REFLEX MICROSCOPIC
Bilirubin Urine: NEGATIVE
Glucose, UA: NEGATIVE mg/dL
Ketones, ur: NEGATIVE mg/dL
pH: 6.5 (ref 5.0–8.0)

## 2011-07-07 LAB — URINE MICROSCOPIC-ADD ON

## 2011-07-07 LAB — ETHANOL: Alcohol, Ethyl (B): <11 mg/dL (ref 0–11)

## 2011-07-07 MED ORDER — LORAZEPAM 1 MG PO TABS: 1.0000 mg | ORAL_TABLET | Freq: Three times a day (TID) | ORAL | Status: DC | PRN

## 2011-07-07 MED ORDER — LORAZEPAM 2 MG/ML IJ SOLN
1.0000 mg | Freq: Once | INTRAMUSCULAR | Status: AC
  Administered 2011-07-07: 1 mg via INTRAVENOUS
  Filled 2011-07-07: qty 1

## 2011-07-07 NOTE — ED Notes (Signed)
The pt arrived by gems from home.  She was found wandering around in her neighborhood confused.  She had apparently been wandering around outside in the heat since 1200n today.  She has a history of seizures the last one she had was 3 years ago.  She does not take any meds  For the seizures.  On arrival alert c/o leg cramps and she has a tremor in her lt arm.  Iv per ems

## 2011-07-07 NOTE — ED Provider Notes (Signed)
History     CSN: [id number]  Arrival date & time 07/07/11  1826   None     Chief Complaint  Patient presents with  . Altered Mental Status    (Consider location/radiation/quality/duration/timing/severity/associated sxs/prior treatment) HPI Comments: She was found wandering around in her neighborhood confused.  She had apparently been wandering around outside since 1200n today.  She has a history of seizures the last one she had was 3 years ago. She does not take any meds for the seizures.   Pt says she has no complaints at this time but does feel sluggish.  Patient is a 56 y.o. female presenting with altered mental status. The history is provided by the patient.  Altered Mental Status This is a new problem. The current episode started today. The problem occurs constantly. The problem has been unchanged. Pertinent negatives include no abdominal pain, chest pain, congestion, fever, numbness, rash, vomiting or weakness. The symptoms are aggravated by nothing. She has tried nothing for the symptoms.    Past Medical History  Diagnosis Date  . Seizures     History reviewed. No pertinent past surgical history.  No family history on file.  History  Substance Use Topics  . Smoking status: Never Smoker   . Smokeless tobacco: Not on file  . Alcohol Use: No    OB History    Grav Para Term Preterm Abortions TAB SAB Ect Mult Living                  Review of Systems  Constitutional: Negative for fever and activity change.  HENT: Negative for congestion.   Eyes: Negative for visual disturbance.  Respiratory: Negative for chest tightness and shortness of breath.   Cardiovascular: Negative for chest pain and leg swelling.  Gastrointestinal: Negative for vomiting and abdominal pain.  Genitourinary: Negative for dysuria.  Skin: Negative for rash.  Neurological: Negative for syncope, weakness and numbness.  Psychiatric/Behavioral: Positive for altered mental status. Negative for  behavioral problems.    Allergies  Codeine  Home Medications   Current Outpatient Rx  Name Route Sig Dispense Refill  . HALOPERIDOL 1 MG PO TABS Oral Take 2 mg by mouth at bedtime. And 1/2 tablet as needed    . LORAZEPAM 1 MG PO TABS Oral Take 1 mg by mouth every 8 (eight) hours.    Marland Kitchen METAXALONE 800 MG PO TABS Oral Take 400-800 mg by mouth 3 (three) times daily as needed. For pain    . METOPROLOL TARTRATE 25 MG PO TABS Oral Take 25 mg by mouth 2 (two) times daily.    Marland Kitchen TEMAZEPAM 15 MG PO CAPS Oral Take 30 mg by mouth at bedtime as needed.    Marland Kitchen ZOLPIDEM TARTRATE 10 MG PO TABS Oral Take 20 mg by mouth at bedtime as needed.    Marland Kitchen LORAZEPAM 1 MG PO TABS Oral Take 1 tablet (1 mg total) by mouth every 8 (eight) hours as needed for anxiety. 5 tablet 0    BP 171/84  Pulse 125  Temp(Src) 98.4 F (36.9 C) (Oral)  Resp 20  SpO2 95%  Physical Exam  Constitutional: She is oriented to person, place, and time. She appears well-developed and well-nourished.  HENT:  Head: Normocephalic and atraumatic.  Eyes: Conjunctivae and EOM are normal. Pupils are equal, round, and reactive to light. No scleral icterus.  Neck: Normal range of motion. Neck supple.  Cardiovascular: Normal rate and regular rhythm.  Exam reveals no gallop and no friction rub.  No murmur heard. Pulmonary/Chest: Effort normal and breath sounds normal. No respiratory distress. She has no wheezes. She has no rales. She exhibits no tenderness.  Abdominal: Soft. She exhibits no distension and no mass. There is no tenderness. There is no rebound and no guarding.  Musculoskeletal: Normal range of motion.  Neurological: She is alert and oriented to person, place, and time. She has normal reflexes. No cranial nerve deficit. She exhibits normal muscle tone. Coordination normal.       5/5 strength in all extremities.  Skin: Skin is warm and dry. No rash noted.  Psychiatric: She has a normal mood and affect. Her behavior is normal.  Judgment and thought content normal.    ED Course  Procedures (including critical care time)  Labs Reviewed  CBC - Abnormal; Notable for the following:    WBC 10.9 (*)    All other components within normal limits  DIFFERENTIAL - Abnormal; Notable for the following:    Neutrophils Relative 81 (*)    Neutro Abs 8.9 (*)    All other components within normal limits  COMPREHENSIVE METABOLIC PANEL - Abnormal; Notable for the following:    Glucose, Bld 125 (*)    All other components within normal limits  URINALYSIS, ROUTINE W REFLEX MICROSCOPIC - Abnormal; Notable for the following:    Hgb urine dipstick SMALL (*)    Leukocytes, UA SMALL (*)    All other components within normal limits  GLUCOSE, CAPILLARY - Abnormal; Notable for the following:    Glucose-Capillary 103 (*)    All other components within normal limits  URINE MICROSCOPIC-ADD ON - Abnormal; Notable for the following:    Squamous Epithelial / LPF MANY (*)    Bacteria, UA FEW (*)    All other components within normal limits  ETHANOL  URINE RAPID DRUG SCREEN (HOSP PERFORMED)   Ct Head Wo Contrast  07/07/2011  *RADIOLOGY REPORT*  Clinical Data: Confusion.  Seizure history.  CT HEAD WITHOUT CONTRAST  Technique:  Contiguous axial images were obtained from the base of the skull through the vertex without contrast.  Comparison: 10/29/2010  Findings: Left-sided cochlear implant noted and subcutaneous portion of the device generates substantial streak artifact obscuring much of the left hemisphere.  Within this limitation, no acute hemorrhage can be identified.  There is no hydrocephalus.  No definite CT evidence for acute ischemia.  No abnormal extra-axial fluid collection is evident.  The visualized paranasal sinuses and mastoid air cells are clear.  IMPRESSION: Stable exam.  Artifact from the patient's left cochlear implant device obscures large portions of the left supratentorial brain. Within this limitation, no acute intracranial  abnormality is evident.  Original Report Authenticated By: ERIC A. MANSELL, M.D.     1. Confusion       MDM  She was found wandering around in her neighborhood confused.  She had apparently been wandering around outside since 1200n today.  She has a history of seizures the last one she had was 3 years ago. She does not take any meds for the seizures.   No seizures today per EMS/pt.  Pt says she has no complaints at this time but does feel sluggish.  VSS and well appearing.  Slightly confused but nonfocal neuro exam.  Labs, head CT unconcerning.  Pt had episode in ED of tachcyardia and anxiety.  Gave 1mg  ativan and symptoms resolved.  Afterwards pt with normal menal status and no further confusion.  Etiology of symptoms unclear but appear resolved at  this time.  Will give a few doses of prn ativan at home and have f/u with PCP.  Pt comfortable with plan and says she wants to go home.        Army Chaco, MD 07/07/11 2245

## 2011-07-07 NOTE — ED Notes (Signed)
Patient transported to CT 

## 2011-07-07 NOTE — ED Notes (Signed)
Pt continues to have mild shaking.  Pt completely alert and oriented.  Removed blankets pt's socks.  Placed ice packs on pt's neck and chest.  Gave pt small amount of water with ice.  Pt reports feeling better at this time.

## 2011-07-07 NOTE — Discharge Instructions (Signed)
Confusion Confusion is the inability to think with your usual speed or clarity. Confusion may come on quickly or slowly over time. How quickly the confusion comes on depends on the cause. Confusion can be due to any number of causes. CAUSES   Concussion, head injury, or head trauma.   Seizures.   Stroke.   Fever.   Senility.   Heightened emotional states like rage or terror.   Mental illness in which the person loses the ability to determine what is real and what is not (hallucinations).   Infections.   Toxic effects from alcohol, drugs, or prescription medicines.   Dehydration and an imbalance of salts in the body (electrolytes).   Lack of sleep.   Low blood sugar (diabetes).   Low levels of oxygen (for example from chronic lung disorders).   Drug interactions or other medication side effects.   Nutritional deficiencies, especially niacin, thiamine, vitamin C, or vitamin B.   Sudden drop in body temperature (hypothermia).   Illness in the elderly. Constipation can result in confusion. An elderly person who is hospitalized may become confused due to change in daily routine.  SYMPTOMS  People often describe their thinking as cloudy or unclear when they are confused. Confusion can also include feeling disoriented. That means you are unaware of where or who you are. You may also not know what the date or time is. If confused, you may also have difficulty paying attention, remembering and making decisions. Some people also act aggressively when they are confused.  DIAGNOSIS  The medical evaluation of confusion may include:  Blood and urine tests.   X-rays.   Brain and nervous system tests.   Analyzing your brain waves (electroencphalogram or EEG).   A special X-ray (MRI) of your head or other special studies.  Your physician will ask questions such as:  Do you get days and nights mixed up?   Are you awake during regular sleep times?   Do you have trouble  recognizing people?   Do you know where you are?   Do you know the date and time?   Does the confusion come and go?   Is the confusion quickly getting worse?   Has there been a recent illness?   Has there been a recent head injury?   Are you diabetic?   Do you have a lung disorder?   What medication are you taking?   Have you taken drugs or alcohol?  TREATMENT  An admission to the hospital may not be needed, but a confused person should not be left alone. Stay with a family member or friend until the confusion clears. Avoid alcohol, pain relievers or sedative drugs until you have fully recovered. Do not drive until your caregiver says it is okay. HOME CARE INSTRUCTIONS What family and friends can do:  To find out if someone is confused ask him or her their name, age, and the date. If the person is unsure or answers incorrectly, he or she is confused.   Always introduce yourself, no matter how well the person knows you.   Often remind the person of his or her location.   Place a calendar and clock near the confused person.   Talk about current events and plans for the day.   Try to keep the environment calm, quiet and peaceful.   Make sure the patient keeps follow up appointments with their physician.  PREVENTION  Ways to prevent confusion:  Avoid alcohol.   Eat a balanced   diet.   Get enough sleep.   Do not become isolated. Spend time with other people and make plans for your days.   Keep careful watch on your blood sugar levels if you are diabetic.  SEEK IMMEDIATE MEDICAL CARE IF:   You develop severe headaches, repeated vomiting, seizures, blackouts or slurred speech.   There is increasing confusion, weakness, numbness, restlessness or personality changes.   You develop a loss of balance, have marked dizziness, feel uncoordinated or fall.   You have delusions, hallucinations or develop severe anxiety.   Your family members think you need to be rechecked.    Document Released: 03/24/2004 Document Revised: 02/03/2011 Document Reviewed: 11/20/2007 [employer] Patient Information 2012 [employer]. 

## 2011-07-07 NOTE — ED Notes (Signed)
Trembling and diaphoresis reduced.  HR diminished.

## 2011-07-07 NOTE — ED Provider Notes (Signed)
56 year old female who was brought in with altered mental status and that she was wandering around her neighborhood appearing confused. In the emergency department, she has been awake and alert. She did have an episode where she got very agitated and heart rate went up to 140 and she improved with some IV Ativan. Her exam does not show any focal findings. Workup is unremarkable as well including CT of the head.  ECG shows normal sinus rhythm with a rate of 82, no ectopy. Normal axis. Normal P wave. Normal QRS. Normal intervals. Normal ST and T waves. Impression: normal ECG. When compared with ECG of 10/29/2010, no significant changes are seen.   Dione Booze, MD 07/07/11 2055

## 2011-07-07 NOTE — ED Notes (Signed)
Await urine results

## 2011-07-08 ENCOUNTER — Inpatient Hospital Stay (HOSPITAL_COMMUNITY): Payer: BC Managed Care – PPO

## 2011-07-08 ENCOUNTER — Emergency Department (HOSPITAL_COMMUNITY): Payer: BC Managed Care – PPO

## 2011-07-08 ENCOUNTER — Encounter (HOSPITAL_COMMUNITY): Payer: Self-pay

## 2011-07-08 ENCOUNTER — Inpatient Hospital Stay (HOSPITAL_COMMUNITY)
Admission: EM | Admit: 2011-07-08 | Discharge: 2011-07-11 | DRG: 248 | Disposition: A | Discharge status: Home / Self Care | Payer: BC Managed Care – PPO | Attending: Infectious Diseases | Admitting: Infectious Diseases

## 2011-07-08 DIAGNOSIS — F411 Generalized anxiety disorder: Secondary | ICD-10-CM | POA: Diagnosis present

## 2011-07-08 DIAGNOSIS — G4700 Insomnia, unspecified: Secondary | ICD-10-CM | POA: Diagnosis present

## 2011-07-08 DIAGNOSIS — I498 Other specified cardiac arrhythmias: Secondary | ICD-10-CM | POA: Diagnosis present

## 2011-07-08 DIAGNOSIS — E7800 Pure hypercholesterolemia, unspecified: Secondary | ICD-10-CM | POA: Diagnosis present

## 2011-07-08 DIAGNOSIS — Z79899 Other long term (current) drug therapy: Secondary | ICD-10-CM

## 2011-07-08 DIAGNOSIS — I639 Cerebral infarction, unspecified: Secondary | ICD-10-CM

## 2011-07-08 DIAGNOSIS — Z8673 Personal history of transient ischemic attack (TIA), and cerebral infarction without residual deficits: Secondary | ICD-10-CM

## 2011-07-08 DIAGNOSIS — E785 Hyperlipidemia, unspecified: Secondary | ICD-10-CM | POA: Diagnosis present

## 2011-07-08 DIAGNOSIS — M6282 Rhabdomyolysis: Principal | ICD-10-CM

## 2011-07-08 DIAGNOSIS — R4182 Altered mental status, unspecified: Secondary | ICD-10-CM | POA: Diagnosis present

## 2011-07-08 DIAGNOSIS — J45909 Unspecified asthma, uncomplicated: Secondary | ICD-10-CM | POA: Diagnosis present

## 2011-07-08 DIAGNOSIS — Z888 Allergy status to other drugs, medicaments and biological substances status: Secondary | ICD-10-CM

## 2011-07-08 DIAGNOSIS — Z87891 Personal history of nicotine dependence: Secondary | ICD-10-CM

## 2011-07-08 DIAGNOSIS — G40909 Epilepsy, unspecified, not intractable, without status epilepticus: Secondary | ICD-10-CM | POA: Diagnosis present

## 2011-07-08 DIAGNOSIS — M479 Spondylosis, unspecified: Secondary | ICD-10-CM | POA: Diagnosis present

## 2011-07-08 DIAGNOSIS — F4310 Post-traumatic stress disorder, unspecified: Secondary | ICD-10-CM | POA: Diagnosis present

## 2011-07-08 DIAGNOSIS — N390 Urinary tract infection, site not specified: Secondary | ICD-10-CM | POA: Diagnosis present

## 2011-07-08 DIAGNOSIS — I10 Essential (primary) hypertension: Secondary | ICD-10-CM | POA: Diagnosis present

## 2011-07-08 DIAGNOSIS — E860 Dehydration: Secondary | ICD-10-CM | POA: Diagnosis present

## 2011-07-08 DIAGNOSIS — G9340 Encephalopathy, unspecified: Secondary | ICD-10-CM

## 2011-07-08 DIAGNOSIS — F209 Schizophrenia, unspecified: Secondary | ICD-10-CM | POA: Diagnosis present

## 2011-07-08 HISTORY — DX: Anxiety disorder, unspecified: F41.9

## 2011-07-08 HISTORY — DX: Cardiac murmur, unspecified: R01.1

## 2011-07-08 HISTORY — DX: Hypoglycemia, unspecified: E16.2

## 2011-07-08 HISTORY — DX: Other chronic pain: G89.29

## 2011-07-08 HISTORY — DX: Calculus of kidney: N20.0

## 2011-07-08 HISTORY — DX: Schizophrenia, unspecified: F20.9

## 2011-07-08 HISTORY — DX: Cerebral infarction, unspecified: I63.9

## 2011-07-08 HISTORY — DX: Pure hypercholesterolemia, unspecified: E78.00

## 2011-07-08 HISTORY — DX: Post-traumatic stress disorder, unspecified: F43.10

## 2011-07-08 HISTORY — DX: Dorsalgia, unspecified: M54.9

## 2011-07-08 HISTORY — DX: Essential (primary) hypertension: I10

## 2011-07-08 LAB — COMPREHENSIVE METABOLIC PANEL
ALT: 36 U/L — ABNORMAL HIGH (ref 0–35)
AST: 122 U/L — ABNORMAL HIGH (ref 0–37)
Albumin: 4.1 g/dL (ref 3.5–5.2)
Calcium: 10.1 mg/dL (ref 8.4–10.5)
Sodium: 141 mEq/L (ref 135–145)
Total Protein: 6.4 g/dL (ref 6.0–8.3)

## 2011-07-08 LAB — CBC
MCH: 29.6 pg (ref 26.0–34.0)
MCV: 86.5 fL (ref 78.0–100.0)
Platelets: 216 10*3/uL (ref 150–400)
RDW: 12.9 % (ref 11.5–15.5)
WBC: 14.6 10*3/uL — ABNORMAL HIGH (ref 4.0–10.5)

## 2011-07-08 LAB — URINE MICROSCOPIC-ADD ON

## 2011-07-08 LAB — DIFFERENTIAL
Basophils Absolute: 0 10*3/uL (ref 0.0–0.1)
Eosinophils Absolute: 0 10*3/uL (ref 0.0–0.7)
Eosinophils Relative: 0 % (ref 0–5)

## 2011-07-08 LAB — RAPID URINE DRUG SCREEN, HOSP PERFORMED
Barbiturates: NOT DETECTED
Benzodiazepines: POSITIVE — AB
Cocaine: NOT DETECTED
Tetrahydrocannabinol: NOT DETECTED

## 2011-07-08 LAB — URINALYSIS, ROUTINE W REFLEX MICROSCOPIC
Bilirubin Urine: NEGATIVE
Nitrite: NEGATIVE
Specific Gravity, Urine: 1.019 (ref 1.005–1.030)
pH: 5.5 (ref 5.0–8.0)

## 2011-07-08 LAB — MAGNESIUM: Magnesium: 2 mg/dL (ref 1.5–2.5)

## 2011-07-08 LAB — AMMONIA: Ammonia: 22 umol/L (ref 11–60)

## 2011-07-08 LAB — CK: Total CK: 6531 U/L — ABNORMAL HIGH (ref 7–177)

## 2011-07-08 LAB — GLUCOSE, CAPILLARY: Glucose-Capillary: 102 mg/dL — ABNORMAL HIGH (ref 70–99)

## 2011-07-08 MED ORDER — ZOLPIDEM TARTRATE 5 MG PO TABS
10.0000 mg | ORAL_TABLET | Freq: Once | ORAL | Status: AC | PRN
  Administered 2011-07-09: 10 mg via ORAL
  Filled 2011-07-08: qty 1

## 2011-07-08 MED ORDER — SODIUM CHLORIDE 0.9 % IV SOLN
Freq: Once | INTRAVENOUS | Status: AC
  Administered 2011-07-08: 09:00:00 via INTRAVENOUS

## 2011-07-08 MED ORDER — DEXTROSE 5 % IV SOLN
1.0000 g | Freq: Once | INTRAVENOUS | Status: AC
  Administered 2011-07-08: 1 g via INTRAVENOUS
  Filled 2011-07-08: qty 10

## 2011-07-08 MED ORDER — ALBUTEROL SULFATE HFA 108 (90 BASE) MCG/ACT IN AERS
2.0000 | INHALATION_SPRAY | RESPIRATORY_TRACT | Status: DC | PRN
  Filled 2011-07-08: qty 6.7

## 2011-07-08 MED ORDER — ASPIRIN EC 81 MG PO TBEC
81.0000 mg | DELAYED_RELEASE_TABLET | Freq: Every day | ORAL | Status: DC
  Administered 2011-07-08 – 2011-07-11 (×4): 81 mg via ORAL
  Filled 2011-07-08 (×5): qty 1

## 2011-07-08 MED ORDER — DEXTROSE 5 % IV SOLN
1.0000 g | INTRAVENOUS | Status: DC
  Administered 2011-07-09: 1 g via INTRAVENOUS
  Filled 2011-07-08: qty 10

## 2011-07-08 MED ORDER — LORAZEPAM 1 MG PO TABS
1.0000 mg | ORAL_TABLET | Freq: Three times a day (TID) | ORAL | Status: DC
  Administered 2011-07-08 – 2011-07-10 (×6): 1 mg via ORAL
  Filled 2011-07-08 (×6): qty 1

## 2011-07-08 MED ORDER — SODIUM CHLORIDE 0.9 % IJ SOLN
3.0000 mL | Freq: Two times a day (BID) | INTRAMUSCULAR | Status: DC
  Administered 2011-07-08 – 2011-07-10 (×4): 3 mL via INTRAVENOUS

## 2011-07-08 MED ORDER — ENOXAPARIN SODIUM 40 MG/0.4ML ~~LOC~~ SOLN
40.0000 mg | SUBCUTANEOUS | Status: DC
  Filled 2011-07-08 (×5): qty 0.4

## 2011-07-08 MED ORDER — VITAMIN B-1 100 MG PO TABS
100.0000 mg | ORAL_TABLET | Freq: Every day | ORAL | Status: DC
  Administered 2011-07-08 – 2011-07-11 (×4): 100 mg via ORAL
  Filled 2011-07-08 (×5): qty 1

## 2011-07-08 MED ORDER — METOPROLOL TARTRATE 25 MG PO TABS
25.0000 mg | ORAL_TABLET | Freq: Two times a day (BID) | ORAL | Status: DC
  Administered 2011-07-08 – 2011-07-11 (×6): 25 mg via ORAL
  Filled 2011-07-08 (×8): qty 1

## 2011-07-08 MED ORDER — SODIUM CHLORIDE 0.9 % IV BOLUS (SEPSIS)
1000.0000 mL | Freq: Once | INTRAVENOUS | Status: AC
  Administered 2011-07-08: 1000 mL via INTRAVENOUS

## 2011-07-08 MED ORDER — HALOPERIDOL 2 MG PO TABS
2.0000 mg | ORAL_TABLET | Freq: Every day | ORAL | Status: DC
  Administered 2011-07-08 – 2011-07-10 (×3): 2 mg via ORAL
  Filled 2011-07-08 (×5): qty 1

## 2011-07-08 MED ORDER — FOLIC ACID 1 MG PO TABS
1.0000 mg | ORAL_TABLET | Freq: Every day | ORAL | Status: DC
  Administered 2011-07-09 – 2011-07-11 (×3): 1 mg via ORAL
  Filled 2011-07-08 (×5): qty 1

## 2011-07-08 MED ORDER — LORAZEPAM 2 MG/ML IJ SOLN
1.0000 mg | Freq: Once | INTRAMUSCULAR | Status: AC
  Administered 2011-07-08: 1 mg via INTRAVENOUS
  Filled 2011-07-08: qty 1

## 2011-07-08 MED ORDER — METOPROLOL TARTRATE 12.5 MG HALF TABLET: 12.5000 mg | ORAL_TABLET | Freq: Two times a day (BID) | ORAL | Status: DC

## 2011-07-08 MED ORDER — THIAMINE HCL 100 MG/ML IJ SOLN
Freq: Once | INTRAVENOUS | Status: AC
  Administered 2011-07-08: via INTRAVENOUS
  Filled 2011-07-08 (×2): qty 1000

## 2011-07-08 MED ORDER — ADULT MULTIVITAMIN W/MINERALS CH
1.0000 | ORAL_TABLET | Freq: Every day | ORAL | Status: DC
  Administered 2011-07-09 – 2011-07-11 (×3): 1 via ORAL
  Filled 2011-07-08 (×5): qty 1

## 2011-07-08 MED ORDER — METAXALONE 800 MG PO TABS
800.0000 mg | ORAL_TABLET | Freq: Once | ORAL | Status: AC | PRN
  Administered 2011-07-09: 800 mg via ORAL
  Filled 2011-07-08: qty 1

## 2011-07-08 MED ORDER — TEMAZEPAM 15 MG PO CAPS
30.0000 mg | ORAL_CAPSULE | Freq: Every evening | ORAL | Status: DC | PRN
  Administered 2011-07-08 – 2011-07-10 (×3): 30 mg via ORAL
  Filled 2011-07-08 (×3): qty 2

## 2011-07-08 MED ORDER — FLUTICASONE PROPIONATE 50 MCG/ACT NA SUSP
1.0000 | Freq: Two times a day (BID) | NASAL | Status: DC | PRN
  Filled 2011-07-08: qty 16

## 2011-07-08 NOTE — ED Notes (Signed)
Pt calling ex husband

## 2011-07-08 NOTE — ED Notes (Signed)
Please contact Mr Loryn Haacke @ [phone]

## 2011-07-08 NOTE — ED Notes (Signed)
The pt is alert   She has no complaints.  pleasant

## 2011-07-08 NOTE — ED Notes (Signed)
Patient undressed and in a gown. Cardiac monitor, pulse oximetry, and blood pressure cuff on. 

## 2011-07-08 NOTE — ED Provider Notes (Signed)
History     CSN: [id number]  Arrival date & time 07/08/11  4782   First MD Initiated Contact with Patient 07/08/11 [phone]      Chief Complaint  Patient presents with  . Altered Mental Status    (Consider location/radiation/quality/duration/timing/severity/associated sxs/prior treatment) HPI Comments: Patient brought by EMS after being found underneath a trailer in the hospital parking lot. She appeared to be confused and was shaking. She was seen last evening for altered mental status and wandering around a curve to baseline and was discharged. She is a remote history of seizures but does not take antiepileptics. Patient is hard of hearing but awake and alert following commands. She is oriented to person, place and situation. She states that her phone had died and she was unable to find a ride home so she tried waving people down and praying.  She is unable to tell me why she crawled under a trailer for why she did not seek help for transportation home.  The history is provided by the patient and the EMS personnel.    Past Medical History  Diagnosis Date  . Seizures     History reviewed. No pertinent past surgical history.  History reviewed. No pertinent family history.  History  Substance Use Topics  . Smoking status: Never Smoker   . Smokeless tobacco: Not on file  . Alcohol Use: No    OB History    Grav Para Term Preterm Abortions TAB SAB Ect Mult Living                  Review of Systems  Constitutional: Positive for activity change. Negative for fever.  HENT: Negative for congestion and rhinorrhea.   Respiratory: Negative for cough, chest tightness and shortness of breath.   Cardiovascular: Negative for chest pain.  Gastrointestinal: Negative for nausea, vomiting and abdominal pain.  Genitourinary: Negative for dysuria and hematuria.  Musculoskeletal: Negative for back pain.  Neurological: Negative for headaches.  Psychiatric/Behavioral: Positive for confusion.  Negative for suicidal ideas and self-injury.    Allergies  Codeine and Sulfa antibiotics  Home Medications   Current Outpatient Rx  Name Route Sig Dispense Refill  . HALOPERIDOL 1 MG PO TABS Oral Take 2 mg by mouth at bedtime. And 1/2 tablet as needed    . LORAZEPAM 1 MG PO TABS Oral Take 1 mg by mouth every 8 (eight) hours.    Marland Kitchen METAXALONE 800 MG PO TABS Oral Take 400-800 mg by mouth 3 (three) times daily as needed. For pain    . METOPROLOL TARTRATE 25 MG PO TABS Oral Take 25 mg by mouth 2 (two) times daily.    Marland Kitchen TEMAZEPAM 15 MG PO CAPS Oral Take 30 mg by mouth at bedtime as needed.    Marland Kitchen ZOLPIDEM TARTRATE 10 MG PO TABS Oral Take 20 mg by mouth at bedtime as needed.      BP 150/63  Pulse 89  Temp(Src) 98.7 F (37.1 C) (Oral)  Resp 18  SpO2 99%  Physical Exam  Constitutional: She is oriented to person, place, and time. She appears well-developed and well-nourished. No distress.       Diffuse body tremors at rest  HENT:  Head: Normocephalic and atraumatic.  Mouth/Throat: Oropharynx is clear and moist. No oropharyngeal exudate.  Eyes: Conjunctivae are normal. Pupils are equal, round, and reactive to light.  Neck: Normal range of motion. Neck supple.       No meningismus  Cardiovascular: Normal rate, regular rhythm and  normal heart sounds.   No murmur heard. Pulmonary/Chest: Effort normal and breath sounds normal. No respiratory distress.  Abdominal: Soft. There is no tenderness. There is no rebound and no guarding.  Musculoskeletal: Normal range of motion. She exhibits no edema and no tenderness.  Neurological: She is alert and oriented to person, place, and time. No cranial nerve deficit.       No asterixis, no focal deficits, follows commands  Skin: Skin is warm.    ED Course  Procedures (including critical care time)   Labs Reviewed  CBC  DIFFERENTIAL  COMPREHENSIVE METABOLIC PANEL  URINALYSIS, ROUTINE W REFLEX MICROSCOPIC  URINE RAPID DRUG SCREEN (HOSP PERFORMED)   ACETAMINOPHEN LEVEL  SALICYLATE LEVEL  ETHANOL  AMMONIA  TROPONIN I   Ct Head Wo Contrast  07/07/2011  *RADIOLOGY REPORT*  Clinical Data: Confusion.  Seizure history.  CT HEAD WITHOUT CONTRAST  Technique:  Contiguous axial images were obtained from the base of the skull through the vertex without contrast.  Comparison: 10/29/2010  Findings: Left-sided cochlear implant noted and subcutaneous portion of the device generates substantial streak artifact obscuring much of the left hemisphere.  Within this limitation, no acute hemorrhage can be identified.  There is no hydrocephalus.  No definite CT evidence for acute ischemia.  No abnormal extra-axial fluid collection is evident.  The visualized paranasal sinuses and mastoid air cells are clear.  IMPRESSION: Stable exam.  Artifact from the patient's left cochlear implant device obscures large portions of the left supratentorial brain. Within this limitation, no acute intracranial abnormality is evident.  Original Report Authenticated By: ERIC A. MANSELL, M.D.     No diagnosis found.    MDM  Altered mental status with bizarre behavior. Now alert and oriented. No focal neurological deficit. No evidence of trauma. No suicidal or homicidal ideation. No hallucinations. Patient denies any alcohol use.  Contaminated urinalysis with possible infection. Leukocytosis increased from yesterday. Tox screen positive for benzodiazepines which she received here. LFTs and ammonia unremarkable. CK elevated.  Psychiatry consult complete. Patient does have encephalopathy but no acute psychosis.  We'll admit for acute encephalopathy, bizarre behavior possible UTI altered mental status. Discussed with teaching service.   Date: 07/08/2011  Rate: 81  Rhythm: normal sinus rhythm  QRS Axis: normal  Intervals: normal  ST/T Wave abnormalities: normal  Conduction Disutrbances:none  Narrative Interpretation:   Old EKG Reviewed: unchanged   CRITICAL CARE Performed  by: Glynn Octave   Total critical care time: 30  Critical care time was exclusive of separately billable procedures and treating other patients.  Critical care was necessary to treat or prevent imminent or life-threatening deterioration.  Critical care was time spent personally by me on the following activities: development of treatment plan with patient and/or surrogate as well as nursing, discussions with consultants, evaluation of patient's response to treatment, examination of patient, obtaining history from patient or surrogate, ordering and performing treatments and interventions, ordering and review of laboratory studies, ordering and review of radiographic studies, pulse oximetry and re-evaluation of patient's condition.      Glynn Octave, MD 07/08/11 [phone]

## 2011-07-08 NOTE — ED Notes (Signed)
[id number] Ready

## 2011-07-08 NOTE — ED Notes (Signed)
Pt discharged last night with confusion, found this am in parking lot under a trailer, shaking,

## 2011-07-08 NOTE — H&P (Signed)
Hospital Admission Note Date: 07/08/2011  Patient name: Jasmine [name] Medical record number: [id number] Date of birth: 1955-07-27 Age: 56 y.o. Gender: female PCP: [name], FNP, FNP  Medical Service: IM teaching service/[facility] Attending physician: Dr. Maurice March    Pager: Resident [phone]): Dr. Allena Katz    Pager:[phone]       Resident (R1): Dr. Manson Passey    Pager:[phone]           Chief Complaint: Abdominal pain, AMS  History of Present Illness: Jasmine [name] is a pleasant 56 y/o woman with a History of Sxhizophrenia (under care of Dr. Joette Catching) and Seizure disorder.  The patient notes a 2-week history of abdominal pain, described as crampy, intermittent RUQ pain, worse after eating, radiating to her back, 8/10 in severity at its worst, with 30-60 minute episodes up to 3 times per day.  The pain is mildly alleviated by taking antacids.  The patient notes associated nausea and vomiting, up to 3 times/day (though variable), as well as up to 2 loose stools per day, and some lightheadedness and fatigue.  She also notes a 2-week history of burning with urination and increased urinary frequency, and chills.  No fevers, hematemesis, BRBPR, melena.  She also reports difficulty sleeping recently, only sleeping 3-4 hours/night.  She denies visual/auditory hallucinations, SI/HI, or headache.  The patient was recently seen in the ED on the night piror to admission, brought to the ED by EMS after being found wandering her neighborhood aimlessly.  The patient was found to be somewhat confused and lethargic, but CT head, UDS, and initial lab work-up was negative.  The patient's mental status improved with Ativan 1 mg, and she was discharged home.  The patient walked outside to use a payphone, but could not find a payphone, and decided to sleep in the parking lot instead.  She was found on the morning of admission with fatigue and confusion, and brought back to the ED.  Psychiatric consult was called, given the patient's history  of schizophrenia, but determined that the patient was not acutely psychotic.  ED Tx: 1. Ceftriaxone, IVF, Ativan   Current Facility-Administered Medications  Medication Dose Route Frequency Provider Last Rate Last Dose  . 0.9 %  sodium chloride infusion   Intravenous Once Hurman Horn, MD 20 mL/hr at 07/08/11 0902    . cefTRIAXone (ROCEPHIN) 1 g in dextrose 5 % 50 mL IVPB  1 g Intravenous Once Glynn Octave, MD   1 g at 07/08/11 1026  . LORazepam (ATIVAN) injection 1 mg  1 mg Intravenous Once Glynn Octave, MD   1 mg at 07/08/11 0901  . sodium chloride 0.9 % bolus 1,000 mL  1,000 mL Intravenous Once Glynn Octave, MD   1,000 mL at 07/08/11 1035   Current Outpatient Prescriptions  Medication Sig Dispense Refill  . haloperidol (HALDOL) 1 MG tablet Take 2 mg by mouth at bedtime. And 1/2 tablet as needed      . LORazepam (ATIVAN) 1 MG tablet Take 1 mg by mouth every 8 (eight) hours.      . metaxalone (SKELAXIN) 800 MG tablet Take 400-800 mg by mouth 3 (three) times daily as needed. For pain      . metoprolol tartrate (LOPRESSOR) 25 MG tablet Take 25 mg by mouth 2 (two) times daily.      . temazepam (RESTORIL) 15 MG capsule Take 30 mg by mouth at bedtime as needed.      . zolpidem (AMBIEN) 10 MG tablet Take 20 mg by  mouth at bedtime as needed.       Facility-Administered Medications Ordered in Other Encounters  Medication Dose Route Frequency Provider Last Rate Last Dose  . LORazepam (ATIVAN) injection 1 mg  1 mg Intravenous Once Army Chaco, MD   1 mg at 07/07/11 1926    Allergies: Codeine-"makes her feel weird." Sulfa -"hives."  Past Medical History  Diagnosis Date  . Seizures with the last seizure 3 years ago. Currently not on any anticonvulsant therapy HTN HLD Insomnia Schizophrenia LBP 2/2 DJD     History reviewed. No pertinent past surgical history.  FMX: Father died of MI at age 82 y/o Mother: "back problems."  History   Social History  . Marital Status:  Legally Separated    Spouse Name: N/A    Number of Children:  32 y/o son who lives with his father and visits the patient on weekends  . Years of Education: 2 years of college   Occupational History  . On a disability   Social History Main Topics  . Smoking status: Quit smoking 7 years ago 20 Pack year history  . Smokeless tobacco: Not on file  . Alcohol Use: No  . Drug Use: No  . Sexually Active: No   Other Topics Concern  . Not on file   Social History Narrative  . On a disability; lives alone in an appartment.    Review of Systems: Per HPI  Physical Exam: VS reviewed. No ortho stasis.  Filed Vitals:   07/08/11 0818 07/08/11 0923 07/08/11 1012 07/08/11 1029  BP:    140/69  Pulse:   99   Temp: 98.5 F (36.9 C) 98.7 F (37.1 C)    TempSrc: Rectal     Resp:   16   SpO2:   98%    Physical Exam  Constitutional: She is oriented to person, place, and time. She appears well-developed and well-nourished and tremulous throughout examination. HENT:  Head: Normocephalic and atraumatic.  Eyes: Conjunctivae and EOM are normal. Pupils are equal, round, and reactive to light. No scleral icterus.  Neck: Normal range of motion. Neck supple.  Cardiovascular: Normal rate and regular rhythm. Exam reveals no gallop and no friction rub.  No murmur heard.  Pulmonary/Chest: Effort normal and breath sounds normal. No respiratory distress. She has no wheezes. She has no rales. She exhibits no tenderness.  Abdominal: Soft. She exhibits no distension and no mass. There is no tenderness. There is no rebound and no guarding.  Musculoskeletal: Normal range of motion.  Neurological: She is alert and oriented to person, place, and time. She has normal reflexes. No cranial nerve deficit. She exhibits normal muscle tone. Coordination normal. Strength 5+/5 proximally and distally bilatateraly. Skin: Skin is warm and dry. No rash noted.  Psychiatric: She has a normal mood and affect. Her behavior is  normal. Judgment and thought content normal.   Lab results: Basic Metabolic Panel:  Basename 07/08/11 0823 07/07/11 1839  NA 141 144  K 3.5 4.2  CL 106 109  CO2 25 22  GLUCOSE 114* 125*  BUN 14 15  CREATININE 0.52 0.70  CALCIUM 10.1 10.4  MG -- --  PHOS -- --   Liver Function Tests:  [facility] 07/08/11 0823 07/07/11 1839  AST 122* 18  ALT 36* 14  ALKPHOS 59 62  BILITOT 0.5 0.4  PROT 6.4 6.6  ALBUMIN 4.1 4.3    Basename 07/08/11 0827  AMMONIA 22   CBC:  Basename 07/08/11 0823 07/07/11 1839  WBC 14.6* 10.9*  NEUTROABS 12.1* 8.9*  HGB 12.0 13.3  HCT 35.1* 39.1  MCV 86.5 87.9  PLT 216 243   Cardiac Enzymes:  Basename 07/08/11 0828 07/08/11 0824  CKTOTAL -- 6531*  CKMB -- --  CKMBINDEX -- --  TROPONINI <0.30 --   CBG:  Basename 07/07/11 2133 07/07/11 1910  GLUCAP 102* 103*   Urine Drug Screen: Drugs of Abuse     Component Value Date/Time   LABOPIA NONE DETECTED 07/08/2011 0818   COCAINSCRNUR NONE DETECTED 07/08/2011 0818   LABBENZ POSITIVE* 07/08/2011 0818   AMPHETMU NONE DETECTED 07/08/2011 0818   THCU NONE DETECTED 07/08/2011 0818   LABBARB NONE DETECTED 07/08/2011 0818    Alcohol Level:  Basename 07/08/11 0823 07/07/11 1839  ETH <11 <11   Urinalysis:  Basename 07/08/11 0818 07/07/11 2154  COLORURINE YELLOW YELLOW  LABSPEC 1.019 1.011  PHURINE 5.5 6.5  GLUCOSEU NEGATIVE NEGATIVE  HGBUR MODERATE* SMALL*  BILIRUBINUR NEGATIVE NEGATIVE  KETONESUR 15* NEGATIVE  PROTEINUR NEGATIVE NEGATIVE  UROBILINOGEN 0.2 0.2  NITRITE NEGATIVE NEGATIVE  LEUKOCYTESUR MODERATE* SMALL*    Imaging results:  CXR: Stable chest x-ray. No active lung disease. CT head w/o CM: Stable exam. Artifact from the patient's left cochlear implant  device obscures large portions of the left supratentorial brain.  Within this limitation, no acute intracranial abnormality is  evident.  EKG: NSR, no ST abnormalities  Assessment & Plan by Problem: The patient is a 56 yo  woman, history of schizophrenia, presenting with a 2-week history of abdominal pain and dysuria, presenting with AMS.  1. Altered Mental Status - AMS likely secondary to rhabdo vs infection (UTI vs cholecystitis).  Seizure with post-ictal confusion is also a possibility given the patient's history, but less likely given no loss of bowel/bladder or tongue biting.  Unlikely acute psychotic episode, given negative evaluation by psychiatry and lack of hallucinations/delusions.  CT head negative for acute intracranial abnormality.  Symptoms have significantly improved since admission.   -ceftriaxone for UTI, narrow based on urine culture results -IVF for likely volume depletion due to AMS and poor PO intake -[place] abd given RUQ abd pain -telemetry -checking TSH, Mg, RPR, HIV -tylenol prn for headache -continue restoril and ativan prn  2.  Sinus Tachycardia - likely secondary to volume depletion, improved after IV fluids.  EKG unremarkable, CE negative. -continue metoprolol -repeat EKG in am  3. HLD -hold statin due to problem #1.  4. Schizophrenia - patient reports previously hearing voices, which she believes were her own voice.  Seen by Dr. Donell Beers outpatient psychiatry.  Denies current hallucinations/delusions. -continue haldol, ativan  5. DVT PPX -Lovenox  Signed, Janalyn Harder 07/08/2011, 5:27 PM

## 2011-07-09 ENCOUNTER — Inpatient Hospital Stay (HOSPITAL_COMMUNITY): Payer: BC Managed Care – PPO

## 2011-07-09 DIAGNOSIS — I498 Other specified cardiac arrhythmias: Secondary | ICD-10-CM

## 2011-07-09 DIAGNOSIS — R4182 Altered mental status, unspecified: Secondary | ICD-10-CM

## 2011-07-09 LAB — BASIC METABOLIC PANEL
BUN: 8 mg/dL (ref 6–23)
CO2: 25 mEq/L (ref 19–32)
Calcium: 9.3 mg/dL (ref 8.4–10.5)
Chloride: 110 mEq/L (ref 96–112)
Creatinine, Ser: 0.57 mg/dL (ref 0.50–1.10)
Glucose, Bld: 96 mg/dL (ref 70–99)

## 2011-07-09 LAB — CBC
HCT: 33 % — ABNORMAL LOW (ref 36.0–46.0)
Hemoglobin: 11.2 g/dL — ABNORMAL LOW (ref 12.0–15.0)
MCH: 29.9 pg (ref 26.0–34.0)
MCV: 88.2 fL (ref 78.0–100.0)
Platelets: 184 10*3/uL (ref 150–400)
RBC: 3.74 MIL/uL — ABNORMAL LOW (ref 3.87–5.11)

## 2011-07-09 LAB — URINE CULTURE: Culture  Setup Time: 201305101036

## 2011-07-09 LAB — CK TOTAL AND CKMB (NOT AT ARMC)
CK, MB: 12.6 ng/mL (ref 0.3–4.0)
Relative Index: 0.2 (ref 0.0–2.5)
Total CK: 5044 U/L — ABNORMAL HIGH (ref 7–177)

## 2011-07-09 LAB — RPR: RPR Ser Ql: NONREACTIVE

## 2011-07-09 MED ORDER — ZOLPIDEM TARTRATE 5 MG PO TABS
10.0000 mg | ORAL_TABLET | Freq: Every evening | ORAL | Status: DC | PRN
  Administered 2011-07-09 – 2011-07-10 (×2): 10 mg via ORAL
  Filled 2011-07-09 (×2): qty 2

## 2011-07-09 MED ORDER — SODIUM CHLORIDE 0.9 % IV SOLN
INTRAVENOUS | Status: DC
  Administered 2011-07-09: 12:00:00 via INTRAVENOUS

## 2011-07-09 MED ORDER — METAXALONE 800 MG PO TABS
800.0000 mg | ORAL_TABLET | Freq: Three times a day (TID) | ORAL | Status: DC | PRN
  Filled 2011-07-09: qty 1

## 2011-07-09 MED ORDER — CIPROFLOXACIN HCL 250 MG PO TABS
250.0000 mg | ORAL_TABLET | Freq: Two times a day (BID) | ORAL | Status: DC
  Administered 2011-07-10 – 2011-07-11 (×3): 250 mg via ORAL
  Filled 2011-07-09 (×6): qty 1

## 2011-07-09 NOTE — H&P (Signed)
IM Attending  9 woman with psychosis followed by Dr. Donell Beers on haldol, restoril, and ambien, and seen q 3 months.  Brought to ER by EMS after found wandering.  ER found no severe or new problem and discharged her.  She wandered into the parking lot searching for a pay phone.  Not finding one, she lay down and spent the night on the ground in the parking lot!  On return to the ER, her AST overnight changed from 18 to 122.  CK was 6000; UA had mod blood with 0 - 2 RBCs.  Fortunately, creat is 0.5.  Does not have tense compartment and pedal pulses are full. Suggest:  She should be evaluated by a psychiatric social worker with attention to her competence and safety.  Follow Bmet at least 1 more day.  High rate fluids.  Consider calling Dr. Donell Beers to report events and consider whether she is over-medicated.

## 2011-07-09 NOTE — Progress Notes (Signed)
Subjective: No acute events overnight.  Patient's mental status is improved this morning.  Less dysuria.  CK still elevated this morning.  Objective: Vital signs in last 24 hours: Filed Vitals:   07/08/11 1835 07/08/11 2244 07/09/11 0540 07/09/11 1124  BP: 134/74 137/83 103/61 117/73  Pulse: 72 79 60 71  Temp: 98 F (36.7 C) 99.7 F (37.6 C) 98.8 F (37.1 C)   TempSrc: Oral Oral Axillary   Resp: 18 18 18    SpO2: 96% 96% 97%    Weight change:   Intake/Output Summary (Last 24 hours) at 07/09/11 1232 Last data filed at 07/09/11 1129  Gross per 24 hour  Intake      3 ml  Output      1 ml  Net      2 ml   General: alert, cooperative, and in no apparent distress HEENT: pupils equal round and reactive to light, vision grossly intact, oropharynx clear and non-erythematous  Neck: supple, no lymphadenopathy Lungs: clear to ascultation bilaterally, normal work of respiration, no wheezes, rales, ronchi Heart: regular rate and rhythm, no murmurs, gallops, or rubs Abdomen: soft, mildly tender to RUQ palpation, non-distended, no guarding, no rebound tenderness Msk: no joint edema, warmth, or erythema Extremities: no cyanosis, clubbing, or edema Neurologic: alert & oriented X3, cranial nerves II-XII intact, strength grossly intact, sensation intact to light touch  Lab Results: Basic Metabolic Panel:  Lab 07/09/11 1610 07/08/11 2140 07/08/11 0823  NA 144 -- 141  K 3.6 -- 3.5  CL 110 -- 106  CO2 25 -- 25  GLUCOSE 96 -- 114*  BUN 8 -- 14  CREATININE 0.57 -- 0.52  CALCIUM 9.3 -- 10.1  MG -- 2.0 --  PHOS -- -- --   Liver Function Tests:  Lab 07/08/11 0823 07/07/11 1839  AST 122* 18  ALT 36* 14  ALKPHOS 59 62  BILITOT 0.5 0.4  PROT 6.4 6.6  ALBUMIN 4.1 4.3    Lab 07/09/11 0735  LIPASE 29  AMYLASE --    Lab 07/08/11 0827  AMMONIA 22   CBC:  Lab 07/09/11 0735 07/08/11 0823 07/07/11 1839  WBC 7.7 14.6* --  NEUTROABS -- 12.1* 8.9*  HGB 11.2* 12.0 --  HCT 33.0* 35.1*  --  MCV 88.2 86.5 --  PLT 184 216 --   Cardiac Enzymes:  Lab 07/09/11 0735 07/08/11 0828 07/08/11 0824  CKTOTAL 5044* -- 6531*  CKMB 12.6* -- --  CKMBINDEX -- -- --  TROPONINI -- <0.30 --   CBG:  Lab 07/07/11 2133 07/07/11 1910  GLUCAP 102* 103*   Alcohol Level:  Lab 07/08/11 0823 07/07/11 1839  ETH <11 <11   Urinalysis:  Lab 07/08/11 0818 07/07/11 2154  COLORURINE YELLOW YELLOW  LABSPEC 1.019 1.011  PHURINE 5.5 6.5  GLUCOSEU NEGATIVE NEGATIVE  HGBUR MODERATE* SMALL*  BILIRUBINUR NEGATIVE NEGATIVE  KETONESUR 15* NEGATIVE  PROTEINUR NEGATIVE NEGATIVE  UROBILINOGEN 0.2 0.2  NITRITE NEGATIVE NEGATIVE  LEUKOCYTESUR MODERATE* SMALL*    Micro Results: No results found for this or any previous visit (from the past 240 hour(s)). Studies/Results: Dg Chest 2 View  07/08/2011  *RADIOLOGY REPORT*  Clinical Data: Altered mental status, weakness, headache, shortness of breath  CHEST - 2 VIEW  Comparison: Portable chest x-ray of 07/26/2010  Findings: The lungs are clear.  Mediastinal contours appear normal. The heart is within upper limits of normal.  No bony abnormality is seen.  IMPRESSION: Stable chest x-ray.  No active lung disease.  Original Report Authenticated  By: Juline Patch, M.D.   Ct Head Wo Contrast  07/07/2011  *RADIOLOGY REPORT*  Clinical Data: Confusion.  Seizure history.  CT HEAD WITHOUT CONTRAST  Technique:  Contiguous axial images were obtained from the base of the skull through the vertex without contrast.  Comparison: 10/29/2010  Findings: Left-sided cochlear implant noted and subcutaneous portion of the device generates substantial streak artifact obscuring much of the left hemisphere.  Within this limitation, no acute hemorrhage can be identified.  There is no hydrocephalus.  No definite CT evidence for acute ischemia.  No abnormal extra-axial fluid collection is evident.  The visualized paranasal sinuses and mastoid air cells are clear.  IMPRESSION: Stable exam.   Artifact from the patient's left cochlear implant device obscures large portions of the left supratentorial brain. Within this limitation, no acute intracranial abnormality is evident.  Original Report Authenticated By: ERIC A. MANSELL, M.D.   US Abdomen Complete  07/09/2011  *RADIOLOGY REPORT*  Clinical Data:  Right upper quadrant pain.  Concern for cholecystitis  COMPLETE ABDOMINAL ULTRASOUND  Comparison:  CT abdomen 02/16/2008 the  Findings:  Gallbladder:  No gallstones, gallbladder wall thickening, or pericholecystic fluid.  Common bile duct:  Normal at 4 mm.  Liver:  No focal lesion identified.  Within normal limits in parenchymal echogenicity.  IVC:  Appears normal.  Pancreas:  No focal abnormality seen.  Spleen:  Normal size and echogenicity.  Right Kidney:  11.2cm in length.  No evidence of hydronephrosis or stones.  Left Kidney:  11.5cm in length.  No evidence of hydronephrosis or stones.  Abdominal aorta:  No aneurysm identified.  IMPRESSION: Negative abdominal ultrasound.  Original Report Authenticated By: Genevive Bi, M.D.   Medications: I have reviewed the patient's current medications. Scheduled Meds:   . aspirin EC  81 mg Oral Daily  . cefTRIAXone (ROCEPHIN)  IV  1 g Intravenous Q24H  . enoxaparin  40 mg Subcutaneous Q24H  . folic acid  1 mg Oral Daily  . haloperidol  2 mg Oral QHS  . LORazepam  1 mg Oral Q8H  . metoprolol tartrate  25 mg Oral BID  . mulitivitamin with minerals  1 tablet Oral Daily  . general admission iv infusion   Intravenous Once  . sodium chloride  1,000 mL Intravenous Once  . sodium chloride  3 mL Intravenous Q12H  . thiamine  100 mg Oral Daily  . DISCONTD: metoprolol tartrate  12.5 mg Oral BID   Continuous Infusions:   . sodium chloride 150 mL/hr at 07/09/11 1132   PRN Meds:.albuterol, fluticasone, metaxalone, temazepam, zolpidem Assessment/Plan: The patient is a 56 yo woman, history of schizophrenia, presenting with a 2-week history of abdominal  pain and dysuria, presenting with AMS.   1. Altered Mental Status - AMS likely secondary to rhabdo vs infection (UTI vs cholecystitis). Seizure with post-ictal confusion is also a possibility given the patient's history, but less likely given no loss of bowel/bladder or tongue biting.  Symptoms have significantly improved since admission.  -transition from IV ceftriaxone to PO cipro -IVF at 150 cc/hr -RUQ [place] normal -telemetry  -tylenol prn for headache  -continue restoril and ativan prn   2. Sinus Tachycardia - likely secondary to volume depletion, resolved after IV fluids. EKG unremarkable, CE negative.  -continue metoprolol   3. HLD  -hold statin due to problem #1.   4. Schizophrenia - patient reports previously hearing voices, which she believes were her own voice. Seen by Dr. Donell Beers outpatient psychiatry. Denies current  hallucinations/delusions.  -continue haldol, ativan   5. DVT PPX  -Lovenox  6. Dispo - will keep patient today due to continued elevated CK, to receive another day of IV fluids, and plan for discharge tomorrow.   LOS: 1 day   Janalyn Harder 07/09/2011, 12:32 PM

## 2011-07-10 DIAGNOSIS — F4310 Post-traumatic stress disorder, unspecified: Secondary | ICD-10-CM

## 2011-07-10 DIAGNOSIS — F411 Generalized anxiety disorder: Secondary | ICD-10-CM

## 2011-07-10 LAB — BASIC METABOLIC PANEL
BUN: 10 mg/dL (ref 6–23)
CO2: 25 mEq/L (ref 19–32)
Calcium: 9.2 mg/dL (ref 8.4–10.5)
GFR calc non Af Amer: >90 mL/min (ref >90)
Glucose, Bld: 95 mg/dL (ref 70–99)

## 2011-07-10 NOTE — ED Provider Notes (Signed)
I saw and evaluated the patient, reviewed the resident's note and I agree with the findings and plan.   Dione Booze, MD 07/10/11 [phone]

## 2011-07-10 NOTE — Progress Notes (Signed)
Subjective: Patient states that she feels well. She is A+O,  No c/o of pain or discomfort. Nursing staff reports that she had uneventful night.   Objective: Vital signs in last 24 hours: Filed Vitals:   07/09/11 1124 07/09/11 1404 07/09/11 2100 07/10/11 0500  BP: 117/73 132/73 145/79 138/80  Pulse: 71 57 67 64  Temp:  98.2 F (36.8 C) 98.6 F (37 C) 98.3 F (36.8 C)  TempSrc:      Resp:  20 20 18   SpO2:  98% 97% 99%   Weight change:   Intake/Output Summary (Last 24 hours) at 07/10/11 0757 Last data filed at 07/09/11 2100  Gross per 24 hour  Intake    483 ml  Output      1 ml  Net    482 ml   General: alert, cooperative, and in no apparent distress HEENT: pupils equal round and reactive to light, vision grossly intact, oropharynx clear and non-erythematous  Neck: supple, no lymphadenopathy Lungs: clear to ascultation bilaterally, normal work of respiration, no wheezes, rales, ronchi Heart: regular rate and rhythm, no murmurs, gallops, or rubs Abdomen: soft, mildly tender to RUQ palpation, non-distended, no guarding, no rebound tenderness Msk: no joint edema, warmth, or erythema  Extremities: no cyanosis, clubbing, or edema Neurologic: alert & oriented X3, cranial nerves II-XII intact, strength grossly intact, sensation intact to light touch  Lab Results: Basic Metabolic Panel:  Lab 07/10/11 1610 07/09/11 0735 07/08/11 2140  Jasmine [name] 143 144 --  K 3.6 3.6 --  CL 110 110 --  CO2 25 25 --  GLUCOSE 95 96 --  BUN 10 8 --  CREATININE 0.54 0.57 --  CALCIUM 9.2 9.3 --  MG -- -- 2.0  PHOS -- -- --   Liver Function Tests:  Lab 07/08/11 0823 07/07/11 1839  AST 122* 18  ALT 36* 14  ALKPHOS 59 62  BILITOT 0.5 0.4  PROT 6.4 6.6  ALBUMIN 4.1 4.3    Lab 07/09/11 0735  LIPASE 29  AMYLASE --    Lab 07/08/11 0827  AMMONIA 22   CBC:  Lab 07/09/11 0735 07/08/11 0823 07/07/11 1839  WBC 7.7 14.6* --  NEUTROABS -- 12.1* 8.9*  HGB 11.2* 12.0 --  HCT 33.0* 35.1* --  MCV  88.2 86.5 --  PLT 184 216 --   Cardiac Enzymes:  Lab 07/09/11 0735 07/08/11 0828 07/08/11 0824  CKTOTAL 5044* -- 6531*  CKMB 12.6* -- --  CKMBINDEX -- -- --  TROPONINI -- <0.30 --   CBG:  Lab 07/07/11 2133 07/07/11 1910  GLUCAP 102* 103*   Thyroid Function Tests:  Lab 07/08/11 2140  TSH 3.560  T4TOTAL --  FREET4 --  T3FREE --  THYROIDAB --   Urine Drug Screen: Drugs of Abuse     Component Value Date/Time   LABOPIA NONE DETECTED 07/08/2011 0818   COCAINSCRNUR NONE DETECTED 07/08/2011 0818   LABBENZ POSITIVE* 07/08/2011 0818   AMPHETMU NONE DETECTED 07/08/2011 0818   THCU NONE DETECTED 07/08/2011 0818   LABBARB NONE DETECTED 07/08/2011 0818    Alcohol Level:  Lab 07/08/11 0823 07/07/11 1839  ETH <11 <11   Urinalysis:  Lab 07/08/11 0818 07/07/11 2154  COLORURINE YELLOW YELLOW  LABSPEC 1.019 1.011  PHURINE 5.5 6.5  GLUCOSEU NEGATIVE NEGATIVE  HGBUR MODERATE* SMALL*  BILIRUBINUR NEGATIVE NEGATIVE  KETONESUR 15* NEGATIVE  PROTEINUR NEGATIVE NEGATIVE  UROBILINOGEN 0.2 0.2  NITRITE NEGATIVE NEGATIVE  LEUKOCYTESUR MODERATE* SMALL*    Micro Results: Recent Results (from the  past 240 hour(s))  URINE CULTURE     Status: Normal   Collection Time   07/08/11  8:18 AM      Component Value Range Status Comment   Specimen Description URINE, CLEAN CATCH   Final    Special Requests ADDED AT 1029   Final    Culture  Setup Time [id number]   Final    Colony Count 80,000 COLONIES/ML   Final    Culture     Final    Value: Multiple bacterial morphotypes present, none predominant. Suggest appropriate recollection if clinically indicated.   Report Status 07/09/2011 FINAL   Final    Studies/Results: Dg Chest 2 View  07/08/2011  *RADIOLOGY REPORT*  Clinical Data: Altered mental status, weakness, headache, shortness of breath  CHEST - 2 VIEW  Comparison: Portable chest x-ray of 07/26/2010  Findings: The lungs are clear.  Mediastinal contours appear normal. The heart is within  upper limits of normal.  No bony abnormality is seen.  IMPRESSION: Stable chest x-ray.  No active lung disease.  Original Report Authenticated By: Juline Patch, M.D.   US Abdomen Complete  07/09/2011  *RADIOLOGY REPORT*  Clinical Data:  Right upper quadrant pain.  Concern for cholecystitis  COMPLETE ABDOMINAL ULTRASOUND  Comparison:  CT abdomen 02/16/2008 the  Findings:  Gallbladder:  No gallstones, gallbladder wall thickening, or pericholecystic fluid.  Common bile duct:  Normal at 4 mm.  Liver:  No focal lesion identified.  Within normal limits in parenchymal echogenicity.  IVC:  Appears normal.  Pancreas:  No focal abnormality seen.  Spleen:  Normal size and echogenicity.  Right Kidney:  11.2cm in length.  No evidence of hydronephrosis or stones.  Left Kidney:  11.5cm in length.  No evidence of hydronephrosis or stones.  Abdominal aorta:  No aneurysm identified.  IMPRESSION: Negative abdominal ultrasound.  Original Report Authenticated By: Genevive Bi, M.D.   Medications: I have reviewed the patient's current medications. Scheduled Meds:   . aspirin EC  81 mg Oral Daily  . ciprofloxacin  250 mg Oral BID  . enoxaparin  40 mg Subcutaneous Q24H  . folic acid  1 mg Oral Daily  . haloperidol  2 mg Oral QHS  . LORazepam  1 mg Oral Q8H  . metoprolol tartrate  25 mg Oral BID  . mulitivitamin with minerals  1 tablet Oral Daily  . sodium chloride  3 mL Intravenous Q12H  . thiamine  100 mg Oral Daily  . DISCONTD: cefTRIAXone (ROCEPHIN)  IV  1 g Intravenous Q24H   Continuous Infusions:   . sodium chloride 150 mL/hr at 07/09/11 1132   PRN Meds:.albuterol, fluticasone, metaxalone, temazepam, zolpidem Assessment/Plan:  The patient is a 56 yo woman, history of schizophrenia, presenting with a 2-week history of abdominal pain and dysuria, presenting with AMS.  1. Altered Mental Status - AMS likely secondary to rhabdo vs infection (UTI vs cholecystitis) . RUQ U/S is normal.  She has been treated  with IVF for Rhabdo and ABX for UTI. Symptoms have significantly improved since admission.    - continue IVF at 150 cc/hr  - continue ABX -continue restoril and ativan prn   2. UTI: Patient has had 2 week history of dysuria, urinary frequency and chills prior to admission. Her UA shows moderate leukocyte with WBC 11-20 and Negative nitrites. Her Urine culture indicates a "dirty" catch.   Patient has been treated with Ceftriaxone which was switched to Cipro po.   We will continue Cipro for  UTI treatment due to her positive symptoms. And we will not recollect urine since she has been on antibiotics.  - continue cipro.  3. Sinus Tachycardia- resolved  - likely secondary to volume depletion, resolved after IV fluids. EKG unremarkable, CE negative.  -continue metoprolol   4. HLD  -hold statin due to problem #1.   5. Schizophrenia - patient reports previously hearing voices, which she believes were her own voice. Seen by Dr. Donell Beers outpatient psychiatry. Denies current hallucinations/delusions.  -continue haldol, ativan   6. DVT PPX  -Lovenox   7. Dispo - pending psychiatry consult.   LOS: 2 days   Analyse Angst 07/10/2011, 7:57 AM

## 2011-07-10 NOTE — Consult Note (Addendum)
Reason for Consult: AMS Referring Physician: dr. Bobbe Jasmine [name] is an 56 y.o. female.  HPI: Jasmine [name] is a pleasant 56 y/o woman with a History of Sxhizophrenia? (under care of Dr. Joette Catching) and Seizure disorder. Jasmine [name] notes a 2-week history of abdominal pain, described as crampy, intermittent RUQ pain, worse after eating, radiating to Jasmine [name] back, 8/10 in severity at its worst, with 30-60 minute episodes up to 3 times per day. Jasmine pain is mildly alleviated by taking antacids. Jasmine [name] notes associated nausea and vomiting, up to 3 times/day (though variable), as well as up to 2 loose stools per day, and some lightheadedness and fatigue. She also notes a 2-week history of burning with urination and increased urinary frequency, and chills. No fevers, hematemesis, BRBPR, melena. She also reports difficulty sleeping recently, only sleeping 3-4 hours/night. She denies visual/auditory hallucinations, SI/HI, or headache.  Jasmine [name] was recently seen in [facility] ED on Jasmine night piror to admission, brought to [facility] ED by EMS after being found wandering [place]asmine [name] neighborhood aimlessly. Jasmine [name] was found to be somewhat confused and lethargic, but CT head, UDS, and initial lab work-up was negative. Jasmine [name]'s mental status improved with Ativan 1 mg, and she was discharged home. Jasmine [name] walked outside to use a payphone, but could not find a payphone, and decided to sleep in Jasmine parking lot instead. She was found on Jasmine morning of admission with fatigue and confusion, and brought back to [facility] ED.    Jasmine [name] was seen today. Reports being confused before admission not sure why. Denies schizophrenia but reports dx of DID and takes halodol for that. deneis bein depressed. Denies si, hi, avh now. Wants to go home from medical floor. Able to drive and take care of herself at home. Talked with Jasmine [name] husband with Jasmine [name] permission in Jasmine [name] presence and he agreed with Jasmine [name] return home from medical floor and he thinks Jasmine [name] close to  Jasmine [name] baseline.Jasmine [name] plan to stay with Jasmine [name] friend after discharge for few days.      Past Medical History  Diagnosis Date  . Schizophrenia     treated by Dr. Donell Beers  . Hypertension   . High cholesterol   . Heart murmur   . Asthma   . Pneumonia ~ 2009  . History of bronchitis     "last time ~ 2009, before I had pneumonia"  . Hypoglycemia   . Migraine     "used to have them often"  . Seizures     last seizure 3 years ago, not on AED  . Seizure 07/07/11  . Stroke 07/08/11    "I've had mini strokes before; left side of face  is more down than right "  . Chronic high back pain   . Kidney stone   . Anxiety   . PTSD (post-traumatic stress disorder)     Past Surgical History  Procedure Date  . Cochlear implant 2010    left  . Tonsillectomy     "as a child"  . Inguinal hernia repair 1985    left  . Cardiac catheterization     "not sure; I think I did"  . Kidney stone surgery ~ 2006    Family History  Problem Relation Age of Onset  . Heart attack Father 47    Social History:  reports that she quit smoking about 7 years ago. Jasmine [name] smoking use included Cigarettes. She has a 20 pack-year smoking history. She has never used smokeless tobacco. She  reports that she drinks alcohol. She reports that she does not use illicit drugs.  Allergies:  Allergies  Allergen Reactions  . Sulfa Antibiotics Hives  . Codeine Other (See Comments)    Makes [name] feel odd ; "sometimes mild; sometimes severe reaction; mostly severe"    Medications: I have reviewed Jasmine [name]'s current medications.  Results for orders placed during Jasmine hospital encounter of 07/08/11 (from Jasmine past 48 hour(s))  CBC     Status: Abnormal   Collection Time   07/09/11  7:35 AM      Component Value Range Comment   WBC 7.7  4.0 - 10.5 (K/uL)    RBC 3.74 (*) 3.87 - 5.11 (MIL/uL)    Hemoglobin 11.2 (*) 12.0 - 15.0 (g/dL)    HCT 16.1 (*) 09.6 - 46.0 (%)    MCV 88.2  78.0 - 100.0 (fL)    MCH 29.9  26.0 - 34.0 (pg)     MCHC 33.9  30.0 - 36.0 (g/dL)    RDW 04.5  40.9 - 81.1 (%)    Platelets 184  150 - 400 (K/uL)   BASIC METABOLIC PANEL     Status: Normal   Collection Time   07/09/11  7:35 AM      Component Value Range Comment   Sodium 144  135 - 145 (mEq/L)    Potassium 3.6  3.5 - 5.1 (mEq/L)    Chloride 110  96 - 112 (mEq/L)    CO2 25  19 - 32 (mEq/L)    Glucose, Bld 96  70 - 99 (mg/dL)    BUN 8  6 - 23 (mg/dL)    Creatinine, Ser 9.14  0.50 - 1.10 (mg/dL)    Calcium 9.3  8.4 - 10.5 (mg/dL)    GFR calc non Af Amer >90  >90 (mL/min)    GFR calc Af Amer >90  >90 (mL/min)   CK TOTAL AND CKMB     Status: Abnormal   Collection Time   07/09/11  7:35 AM      Component Value Range Comment   Total CK 5044 (*) 7 - 177 (U/L)    CK, MB 12.6 (*) 0.3 - 4.0 (ng/mL)    Relative Index 0.2  0.0 - 2.5    LIPASE, BLOOD     Status: Normal   Collection Time   07/09/11  7:35 AM      Component Value Range Comment   Lipase 29  11 - 59 (U/L)   BASIC METABOLIC PANEL     Status: Normal   Collection Time   07/10/11  6:10 AM      Component Value Range Comment   Sodium 143  135 - 145 (mEq/L)    Potassium 3.6  3.5 - 5.1 (mEq/L)    Chloride 110  96 - 112 (mEq/L)    CO2 25  19 - 32 (mEq/L)    Glucose, Bld 95  70 - 99 (mg/dL)    BUN 10  6 - 23 (mg/dL)    Creatinine, Ser 7.82  0.50 - 1.10 (mg/dL)    Calcium 9.2  8.4 - 10.5 (mg/dL)    GFR calc non Af Amer >90  >90 (mL/min)    GFR calc Af Amer >90  >90 (mL/min)     US Abdomen Complete  07/09/2011  *RADIOLOGY REPORT*  Clinical Data:  Right upper quadrant pain.  Concern for cholecystitis  COMPLETE ABDOMINAL ULTRASOUND  Comparison:  CT abdomen 02/16/2008 [facility]  Findings:  Gallbladder:  No gallstones, gallbladder wall thickening, or pericholecystic fluid.  Common bile duct:  Normal at 4 mm.  Liver:  No focal lesion identified.  Within normal limits in parenchymal echogenicity.  IVC:  Appears normal.  Pancreas:  No focal abnormality seen.  Spleen:  Normal size and echogenicity.  Right  Kidney:  11.2cm in length.  No evidence of hydronephrosis or stones.  Left Kidney:  11.5cm in length.  No evidence of hydronephrosis or stones.  Abdominal aorta:  No aneurysm identified.  IMPRESSION: Negative abdominal ultrasound.  Original Report Authenticated By: Genevive Bi, M.D.    ROS Blood pressure 139/61, pulse 58, temperature 98.1 F (36.7 C), temperature source Axillary, resp. rate 20, SpO2 96.00%. Physical Exam   Alert, on bed  MSE:  Status Examination/Evaluation: ; oriented to person, place only: Appearance: Fairly Groomed Activity: Normal Contact:: Good : speech slow: Normal : upset ay time: ristricted affect Process: logical at times only : Full Content: denies si, hi, AVHThoughts logical No Thoughts: fair insight  :   Dx:   I  delerium nos, Hx of DID II  Def fIII  See medical history.  IV  Unknown V  47  Plan Summary:   Likely delirium due to organic causes. Resolving now  Offered and recommended in [facility]asmine [name] psy but Jasmine [name] refused. Husband refused that too.  Not enough reasons to hold Jasmine [name] against Jasmine [name] will at this time  continue current psy meds and out Jasmine [name] psy follow up and also recommended to stay with some one for few days after discharge  -consider seizure work up as potential cause for Jasmine [name] confusion and also follow up care   Wonda Cerise 07/10/2011, 10:02 PM

## 2011-07-11 ENCOUNTER — Encounter (HOSPITAL_COMMUNITY): Payer: Self-pay

## 2011-07-11 LAB — BASIC METABOLIC PANEL
BUN: 11 mg/dL (ref 6–23)
Creatinine, Ser: 0.63 mg/dL (ref 0.50–1.10)
GFR calc Af Amer: >90 mL/min (ref >90)
GFR calc non Af Amer: >90 mL/min (ref >90)
Glucose, Bld: 98 mg/dL (ref 70–99)
Potassium: 3.8 mEq/L (ref 3.5–5.1)

## 2011-07-11 NOTE — Care Management Note (Signed)
    Page 1 of 1   07/11/2011     10:19:59 AM   CARE MANAGEMENT NOTE 07/11/2011  Patient:  Jasmine [name],Jasmine [name]   Account Number:  [id number]  Date Initiated:  07/11/2011  Documentation initiated by:  GRAVES-BIGELOW,Anabella Capshaw  Subjective/Objective Assessment:   Pt admitted with encephalopathy. Pt lives alone in apratement. Has support of ex husband.     Action/Plan:   No needs from CM at this time.   Anticipated DC Date:  07/11/2011   Anticipated DC Plan:  HOME/SELF CARE         Choice offered to / List presented to:             Status of service:  Completed, signed off Medicare Important Message given?   (If response is "NO", the following Medicare IM given date fields will be blank) Date Medicare IM given:   Date Additional Medicare IM given:    Discharge Disposition:  HOME/SELF CARE  Per UR Regulation:    If discussed at Long Length of Stay Meetings, dates discussed:    Comments:

## 2011-07-11 NOTE — Discharge Summary (Signed)
Internal Medicine Teaching [facility] Discharge Note  Name: Jasmine [name] MRN: [id number] DOB: 08-Mar-1955 56 y.o.  Date of Admission: 07/08/2011  7:22 AM Date of Discharge: 07/11/2011 Attending Physician: Lina Sayre, MD  Discharge Diagnosis: 1. Rhabdomyolysis - with AMS 2. UTI 3. Sinus Tachycardia - secondary to volume depletion, resolved 4. Schizophrenia 5. Hyperlipidemia  Discharge Medications: Medication List  As of 07/11/2011 11:28 AM   TAKE these medications         haloperidol 1 MG tablet   Commonly known as: HALDOL   Take 2 mg by mouth at bedtime. And 1/2 tablet as needed      LORazepam 1 MG tablet   Commonly known as: ATIVAN   Take 1 mg by mouth every 8 (eight) hours.      metaxalone 800 MG tablet   Commonly known as: SKELAXIN   Take 400-800 mg by mouth 3 (three) times daily as needed. For pain      metoprolol tartrate 25 MG tablet   Commonly known as: LOPRESSOR   Take 25 mg by mouth 2 (two) times daily.      temazepam 15 MG capsule   Commonly known as: RESTORIL   Take 30 mg by mouth at bedtime as needed.      zolpidem 10 MG tablet   Commonly known as: AMBIEN   Take 20 mg by mouth at bedtime as needed.            Disposition and follow-up:   Jasmine [name] was discharged from [facility] in stable and improved condition, with improvement in mental status and resolution of dysuria.  The patient will follow-up with [name] on 5/16 to ensure full resolution of dysuria.  The patient will also follow-up with Dr. Donell Beers for further management of Schizophrenia.  Follow-up Appointments: Follow-up Information    Follow up with [name], FNP on 07/14/2011. (3:30 pm)    Contact information:   [facility] [address] [place], [place], [place] [phone]      Follow up with Lucas Mallow, MD on 08/25/2011. (11:00 am)    Contact information:   [address] [place]  [id number] [phone]         Discharge Orders    Future Orders Please Complete By Expires   Diet - low sodium heart healthy      Increase activity slowly      Discharge instructions      Comments:   You were hospitalized for a urinary tract infection, and with a condition called "rhabdomyolysis".  Your urinary tract infection was fully treated with a course of antibiotics in the hospital.  Rhabdomyolysis is a condition that can result from overexertion, dehydration, and muscle breakdown.  To prevent this from happening again, be sure to drink plenty of water, and limit your daily exercise and sun exposure in this hot weather.   Call MD for:  temperature >100.4      Call MD for:  persistant nausea and vomiting      Call MD for:  severe uncontrolled pain         Consultations: Psychiatry (Rasul)  Procedures Performed:  Dg Chest 2 View  07/08/2011  *RADIOLOGY REPORT*  Clinical Data: Altered mental status, weakness, headache, shortness of breath  CHEST - 2 VIEW  Comparison: Portable chest x-ray of 07/26/2010  Findings: The lungs are clear.  Mediastinal contours appear normal. The heart is within upper limits of normal.  No  bony abnormality is seen.  IMPRESSION: Stable chest x-ray.  No active lung disease.  Original Report Authenticated By: Juline Patch, M.D.   Ct Head Wo Contrast  07/07/2011  *RADIOLOGY REPORT*  Clinical Data: Confusion.  Seizure history.  CT HEAD WITHOUT CONTRAST  Technique:  Contiguous axial images were obtained from the base of the skull through the vertex without contrast.  Comparison: 10/29/2010  Findings: Left-sided cochlear implant noted and subcutaneous portion of the device generates substantial streak artifact obscuring much of the left hemisphere.  Within this limitation, no acute hemorrhage can be identified.  There is no hydrocephalus.  No definite CT evidence for acute ischemia.  No abnormal extra-axial fluid collection is evident.  The visualized paranasal sinuses  and mastoid air cells are clear.  IMPRESSION: Stable exam.  Artifact from the patient's left cochlear implant device obscures large portions of the left supratentorial brain. Within this limitation, no acute intracranial abnormality is evident.  Original Report Authenticated By: ERIC A. MANSELL, M.D.   US Abdomen Complete  07/09/2011  *RADIOLOGY REPORT*  Clinical Data:  Right upper quadrant pain.  Concern for cholecystitis  COMPLETE ABDOMINAL ULTRASOUND  Comparison:  CT abdomen 02/16/2008 the  Findings:  Gallbladder:  No gallstones, gallbladder wall thickening, or pericholecystic fluid.  Common bile duct:  Normal at 4 mm.  Liver:  No focal lesion identified.  Within normal limits in parenchymal echogenicity.  IVC:  Appears normal.  Pancreas:  No focal abnormality seen.  Spleen:  Normal size and echogenicity.  Right Kidney:  11.2cm in length.  No evidence of hydronephrosis or stones.  Left Kidney:  11.5cm in length.  No evidence of hydronephrosis or stones.  Abdominal aorta:  No aneurysm identified.  IMPRESSION: Negative abdominal ultrasound.  Original Report Authenticated By: Genevive Bi, M.D.    Admission HPI:  Jasmine [name] is a pleasant 56 y/o woman with a History of Sxhizophrenia (under care of Dr. Joette Catching) and Seizure disorder. The patient notes a 2-week history of abdominal pain, described as crampy, intermittent RUQ pain, worse after eating, radiating to her back, 8/10 in severity at its worst, with 30-60 minute episodes up to 3 times per day. The pain is mildly alleviated by taking antacids. The patient notes associated nausea and vomiting, up to 3 times/day (though variable), as well as up to 2 loose stools per day, and some lightheadedness and fatigue. She also notes a 2-week history of burning with urination and increased urinary frequency, and chills. No fevers, hematemesis, BRBPR, melena. She also reports difficulty sleeping recently, only sleeping 3-4 hours/night. She denies visual/auditory  hallucinations, SI/HI, or headache.  The patient was recently seen in the ED on the night piror to admission, brought to the ED by EMS after being found wandering her neighborhood aimlessly. The patient was found to be somewhat confused and lethargic, but CT head, UDS, and initial lab work-up was negative. The patient's mental status improved with Ativan 1 mg, and she was discharged home. The patient walked outside to use a payphone, but could not find a payphone, and decided to sleep in the parking lot instead. She was found on the morning of admission with fatigue and confusion, and brought back to the ED. Psychiatric consult was called, given the patient's history of schizophrenia, but determined that the patient was not acutely psychotic.  Admission Physical Exam Filed Vitals:    07/08/11 0818  07/08/11 0923  07/08/11 1012  07/08/11 1029   BP:     140/69  Pulse:    99    Temp:  98.5 F (36.9 C)  98.7 F (37.1 C)     TempSrc:  Rectal      Resp:    16    SpO2:    98%     Physical Exam  Constitutional: She is oriented to person, place, and time. She appears well-developed and well-nourished and tremulous throughout examination.  HENT:  Head: Normocephalic and atraumatic.  Eyes: Conjunctivae and EOM are normal. Pupils are equal, round, and reactive to light. No scleral icterus.  Neck: Normal range of motion. Neck supple.  Cardiovascular: Normal rate and regular rhythm. Exam reveals no gallop and no friction rub.  No murmur heard.  Pulmonary/Chest: Effort normal and breath sounds normal. No respiratory distress. She has no wheezes. She has no rales. She exhibits no tenderness.  Abdominal: Soft. She exhibits no distension and no mass. There is no tenderness. There is no rebound and no guarding.  Musculoskeletal: Normal range of motion.  Neurological: She is alert and oriented to person, place, and time. She has normal reflexes. No cranial nerve deficit. She exhibits normal muscle tone.  Coordination normal. Strength 5+/5 proximally and distally bilatateraly.  Skin: Skin is warm and dry. No rash noted.  Psychiatric: She has a normal mood and affect. Her behavior is normal. Judgment and thought content normal.   Admission Labs Basic Metabolic Panel:  [facility]  07/08/11 0823  07/07/11 1839   NA  141  144   K  3.5  4.2   CL  106  109   CO2  25  22   GLUCOSE  114*  125*   BUN  14  15   CREATININE  0.52  0.70   CALCIUM  10.1  10.4   MG  --  --   PHOS  --  --    Liver Function Tests:  [facility]  07/08/11 0823  07/07/11 1839   AST  122*  18   ALT  36*  14   ALKPHOS  59  62   BILITOT  0.5  0.4   PROT  6.4  6.6   ALBUMIN  4.1  4.3     CBC:  [facility]  07/08/11 0823  07/07/11 1839   WBC  14.6*  10.9*   NEUTROABS  12.1*  8.9*   HGB  12.0  13.3   HCT  35.1*  39.1   MCV  86.5  87.9   PLT  216  243    CK: 6531  Hospital Course by problem list: 1. Rhabdomyolysis - On the day prior to admission, the patient accidentally locked herself out of her apartment, then went for a long run for exercise, and when she returned could not re-enter her residence.  Her neighbors noticed her wandering around the apartment complex aimlessly, prompting EMS to be called.  The patient likely had AMS at that time due to mild dehydration.  The patient was brought to the ED at that time, re-hydrated, and discharged, but could not find a ride home, so she slept on the ground overnight.  The patient was then brought back to the ED, found to have an elevated CK to 6531, and admitted for rhabdomyolysis.  Her AMS on presentation was likely a combination of rhabdomyolysis, urinary tract infection, and dehydration.  The patient was given IV fluids, with improvement in mental status by the day of discharge.  2. UTI - the patient noted a 2-week history of dysuria and  suprapubic pain, with UA showing moderate LE and 11-20 WBC's.  The patient was treated with ceftriaxone x2 days, followed by ciprofloxacin x1  day, with resolution of symptoms.  3. Sinus Tachycardia - The patient presented with sinus tachycardia, likely secondary to volume depletion.  This issue resolved with IV fluids.  4. Schizophrenia - the patient has a history of schizophrenia, managed by Dr. Donell Beers in the outpatient setting.  The patient was noted to have mildly flattened affect, so a psychiatry consult was obtained.  Per psychiatry, the patient's symptoms likely represented simple delirium (secondary to above).  The patient will follow-up with Dr. Donell Beers.  Time spent on discharge: 45 minutes  Discharge Vitals:  BP 128/76  Pulse 57  Temp(Src) 98.8 F (37.1 C) (Axillary)  Resp 18  SpO2 97%  Discharge Labs:  Results for orders placed during the hospital encounter of 07/08/11 (from the past 24 hour(s))  BASIC METABOLIC PANEL     Status: Normal   Collection Time   07/11/11  6:35 AM      Component Value Range   Sodium 144  135 - 145 (mEq/L)   Potassium 3.8  3.5 - 5.1 (mEq/L)   Chloride 110  96 - 112 (mEq/L)   CO2 27  19 - 32 (mEq/L)   Glucose, Bld 98  70 - 99 (mg/dL)   BUN 11  6 - 23 (mg/dL)   Creatinine, Ser 4.09  0.50 - 1.10 (mg/dL)   Calcium 9.3  8.4 - 81.1 (mg/dL)   GFR calc non Af Amer >90  >90 (mL/min)   GFR calc Af Amer >90  >90 (mL/min)    Signed: Janalyn Harder 07/11/2011, 11:28 AM

## 2011-07-11 NOTE — Progress Notes (Signed)
UR Completed Roniesha Hollingshead Graves-Bigelow, RN,BSN [phone]  

## 2011-07-11 NOTE — Consult Note (Signed)
Clinical Social Work Progress Note PSYCHIATRY SERVICE LINE 07/11/2011  Patient:  Jasmine [name]  Account:  [id number]  Admit Date:  07/08/2011  Clinical Social Worker:  Ashley Jacobs, LCSW  Date/Time:  07/11/2011 09:30 AM  Review of Patient  Overall Medical Condition:   Met with patient at the bedside, who was alert and oriented to self, place, and reason for admission.  She was receiving medications from RN and compliant.  Patient reports her confusion has decreased and she feels very good.  Patient seen for confusion/AMS   Participation Level:  Active  Participation Quality  Appropriate   Other Participation Quality:   Patient was able to answer questions appropriately and cooperative   Affect  Appropriate   Cognitive  Alert  Appropriate  Oriented   Reaction to Medications/Concerns:   Patient reports as well as husband no medication adjustments at this time.  Patient compliant this AM with medications   Modes of Intervention  Behaviors/Psychosis  Orientation/Capacity   Summary of Progress/Plan at Discharge   Follow up from weekend consult and outpatient referral. Met with patient at the bedside who was very pleasant and appeared WNL.  Patient was smiling and welcoming of CSW. Patient confirmed she is currently seeing Dr. Judie Bonus for outpatient Psych needs and will make her own appointment. patient able to report she will be seeing him this month. Denies any SI, Hi, or psychosis.  Does not appear to be depressed nor verbalizes depression and anxiety level is minimal.  patient does report she is cold and requests a blanket, but that is all of her needs.    With regards to being dc home, patient reports she and her EX husband are still good friends and he checks in on her. Reports she lives alone at home, but reports she has spoken with a friend and she can stay with her for a few days to monitor her medical needs and psych needs.  Husband also reports he will check in on patient.   Patient compliant with safety plan as well as dc plan to home with following up for outpatient.  patient does not meet criteria for inpatient psych admission nor IVC and not agreeable with going to inpatient per weekend consultation service.  Will sign off at this time.  patient has outpatient already established.    Please call if additional needs arise, otherwise, Psych Service has signed off.  Ashley Jacobs, MSW LCSW [phone]

## 2011-07-11 NOTE — Progress Notes (Signed)
Subjective: Patient reports no further dysuria or abdominal pain.  She notes that she feels much better than when she initially presented.  I spoke with patient's Husband (separated) today, who is planning to come and pick her up this morning, and who confirms that he will be assisting the patient to obtain the key to her apartment after picking her up.  Objective: Vital signs in last 24 hours: Filed Vitals:   07/10/11 1040 07/10/11 1350 07/10/11 2100 07/11/11 0500  BP: 147/75 139/61 163/80 128/76  Pulse: 65 58 60 57  Temp:  98.1 F (36.7 C) 98.1 F (36.7 C) 98.8 F (37.1 C)  TempSrc:      Resp:  20 18 18   SpO2:  96% 97% 97%   Weight change:   Intake/Output Summary (Last 24 hours) at 07/11/11 1117 Last data filed at 07/10/11 2100  Gross per 24 hour  Intake      0 ml  Output      1 ml  Net     -1 ml   General: alert, cooperative, and in no apparent distress HEENT: pupils equal round and reactive to light, vision grossly intact, oropharynx clear and non-erythematous  Neck: supple, no lymphadenopathy Lungs: clear to ascultation bilaterally, normal work of respiration, no wheezes, rales, ronchi Heart: regular rate and rhythm, no murmurs, gallops, or rubs Abdomen: soft, non-tender, non-distended, no guarding, no rebound tenderness Msk: no joint edema, warmth, or erythema Extremities: no cyanosis, clubbing, or edema Neurologic: alert & oriented X3, cranial nerves II-XII intact, strength grossly intact, sensation intact to light touch  Lab Results: Basic Metabolic Panel:  Lab 07/11/11 5284 07/10/11 0610 07/08/11 2140  NA 144 143 --  K 3.8 3.6 --  CL 110 110 --  CO2 27 25 --  GLUCOSE 98 95 --  BUN 11 10 --  CREATININE 0.63 0.54 --  CALCIUM 9.3 9.2 --  MG -- -- 2.0  PHOS -- -- --   Liver Function Tests:  Lab 07/08/11 0823 07/07/11 1839  AST 122* 18  ALT 36* 14  ALKPHOS 59 62  BILITOT 0.5 0.4  PROT 6.4 6.6  ALBUMIN 4.1 4.3    Lab 07/09/11 0735  LIPASE 29  AMYLASE  --    Lab 07/08/11 0827  AMMONIA 22   CBC:  Lab 07/09/11 0735 07/08/11 0823 07/07/11 1839  WBC 7.7 14.6* --  NEUTROABS -- 12.1* 8.9*  HGB 11.2* 12.0 --  HCT 33.0* 35.1* --  MCV 88.2 86.5 --  PLT 184 216 --   Cardiac Enzymes:  Lab 07/09/11 0735 07/08/11 0828 07/08/11 0824  CKTOTAL 5044* -- 6531*  CKMB 12.6* -- --  CKMBINDEX -- -- --  TROPONINI -- <0.30 --   CBG:  Lab 07/07/11 2133 07/07/11 1910  GLUCAP 102* 103*   Alcohol Level:  Lab 07/08/11 0823 07/07/11 1839  ETH <11 <11   Urinalysis:  Lab 07/08/11 0818 07/07/11 2154  COLORURINE YELLOW YELLOW  LABSPEC 1.019 1.011  PHURINE 5.5 6.5  GLUCOSEU NEGATIVE NEGATIVE  HGBUR MODERATE* SMALL*  BILIRUBINUR NEGATIVE NEGATIVE  KETONESUR 15* NEGATIVE  PROTEINUR NEGATIVE NEGATIVE  UROBILINOGEN 0.2 0.2  NITRITE NEGATIVE NEGATIVE  LEUKOCYTESUR MODERATE* SMALL*    Micro Results: Recent Results (from the past 240 hour(s))  URINE CULTURE     Status: Normal   Collection Time   07/08/11  8:18 AM      Component Value Range Status Comment   Specimen Description URINE, CLEAN CATCH   Final    Special Requests  ADDED AT 1029   Final    Culture  Setup Time [id number]   Final    Colony Count 80,000 COLONIES/ML   Final    Culture     Final    Value: Multiple bacterial morphotypes present, none predominant. Suggest appropriate recollection if clinically indicated.   Report Status 07/09/2011 FINAL   Final    Studies/Results: No results found. Medications: I have reviewed the patient's current medications. Scheduled Meds:    . aspirin EC  81 mg Oral Daily  . ciprofloxacin  250 mg Oral BID  . enoxaparin  40 mg Subcutaneous Q24H  . folic acid  1 mg Oral Daily  . haloperidol  2 mg Oral QHS  . LORazepam  1 mg Oral Q8H  . metoprolol tartrate  25 mg Oral BID  . mulitivitamin with minerals  1 tablet Oral Daily  . sodium chloride  3 mL Intravenous Q12H  . thiamine  100 mg Oral Daily   Continuous Infusions:    . sodium  chloride 150 mL/hr at 07/09/11 1132   PRN Meds:.albuterol, fluticasone, metaxalone, temazepam, zolpidem Assessment/Plan: The patient is a 56 yo woman, history of schizophrenia, presenting with a 2-week history of abdominal pain and dysuria, presenting with AMS.   1. Altered Mental Status - AMS likely acute delirium secondary to rhabdo vs infection (UTI). Seizure with post-ictal confusion is also a possibility given the patient's history, but less likely given no loss of bowel/bladder or tongue biting.  Symptoms have significantly improved since admission.  -s/p 3 days of antibiotics (ceftriaxone x2days, cipro x1day) -IVF at 150 cc/hr -RUQ [place] normal -telemetry  -tylenol prn for headache  -continue restoril and ativan prn   2. Sinus Tachycardia - likely secondary to volume depletion, resolved after IV fluids. EKG unremarkable, CE negative.  -continue metoprolol   3. HLD  -hold statin due to problem #1, resume at discharge  4. Schizophrenia - patient reports previously hearing voices, which she believes were her own voice. Seen by Dr. Donell Beers outpatient psychiatry. Denies current hallucinations/delusions.  -continue haldol, ativan   5. DVT PPX  -Lovenox  6. Dispo - discharge today with outpatient follow-up   LOS: 3 days   Janalyn Harder 07/11/2011, 11:17 AM

## 2011-07-11 NOTE — Discharge Instructions (Signed)
Rhabdomyolysis Rhabdomyolysis is the breakdown of muscle fibers due to injury. The injury may come from physical damage to the muscle like an injury but other causes are:  High fever (hyperthermia).   Seizures (convulsions).   Low phosphate levels.   Diseases of metabolism.   Heatstroke.   Drug toxicity.   Over exertion.   Alcoholism.   Muscle is cut off from oxygen (anoxia).   The squeezing of nerves and blood vessels (compartment syndrome).  Some drugs which may cause the breakdown of muscle are:  Antibiotics.   Statins.   Alcohol.   Animal toxins.  Myoglobin is a substance which helps muscle use oxygen. When the muscle is damaged, the myoglobin is released into the bloodstream. It is filtered out of the bloodstream by the kidneys. Myoglobin may block up the kidneys. This may cause damage, such as kidney failure. It also breaks down into other damaging toxic parts, which also cause kidney failure.  SYMPTOMS   Dark, red, or tea colored urine.   Weakness of affected muscles.   Weight gain from water retention.   Joint aches and pains.   Irregular heart from high potassium in the blood.   Muscle tenderness or aching.   Generalized weakness.   Seizures.   Feeling tired (fatigue).  DIAGNOSIS  Your caregiver may find muscle tenderness on exam and suspect the problem. Urine tests and blood work can confirm the problem. TREATMENT   Early and aggressive treatment with large amounts of fluids may help prevent kidney failure.   Water producing medicine (diuretic) may be used to help flush the kidneys.   High potassium and calcium problems (electrolyte) in your blood may need treatment.  HOME CARE INSTRUCTIONS  This problem is usually cared for in a hospital. If you are allowed to go home and require dialysis, make sure you keep all appointments for lab work and dialysis. Not doing so could result in death. Document Released: 02/11/04 Document Revised:  02/03/2011 Document Reviewed: 08/11/2008 [facility] Patient Information 2012 [place], [place].

## 2011-07-12 LAB — HIV-1 RNA QUANT-NO REFLEX-BLD: HIV-1 RNA Quant, Log: <1.3 {Log} (ref <1.30)

## 2011-07-14 DIAGNOSIS — F209 Schizophrenia, unspecified: Secondary | ICD-10-CM | POA: Insufficient documentation

## 2011-11-24 ENCOUNTER — Emergency Department (HOSPITAL_COMMUNITY): Payer: BC Managed Care – PPO

## 2011-11-24 ENCOUNTER — Emergency Department (HOSPITAL_COMMUNITY)
Admission: EM | Admit: 2011-11-24 | Discharge: 2011-11-24 | Disposition: A | Discharge status: Home / Self Care | Payer: BC Managed Care – PPO | Attending: Emergency Medicine | Admitting: Emergency Medicine

## 2011-11-24 ENCOUNTER — Encounter (HOSPITAL_COMMUNITY): Payer: Self-pay | Admitting: Cardiology

## 2011-11-24 DIAGNOSIS — Z8659 Personal history of other mental and behavioral disorders: Secondary | ICD-10-CM | POA: Insufficient documentation

## 2011-11-24 DIAGNOSIS — IMO0002 Reserved for concepts with insufficient information to code with codable children: Secondary | ICD-10-CM | POA: Insufficient documentation

## 2011-11-24 DIAGNOSIS — M5410 Radiculopathy, site unspecified: Secondary | ICD-10-CM

## 2011-11-24 DIAGNOSIS — Z79899 Other long term (current) drug therapy: Secondary | ICD-10-CM | POA: Insufficient documentation

## 2011-11-24 DIAGNOSIS — Z8673 Personal history of transient ischemic attack (TIA), and cerebral infarction without residual deficits: Secondary | ICD-10-CM | POA: Insufficient documentation

## 2011-11-24 DIAGNOSIS — R110 Nausea: Secondary | ICD-10-CM

## 2011-11-24 DIAGNOSIS — R112 Nausea with vomiting, unspecified: Secondary | ICD-10-CM | POA: Insufficient documentation

## 2011-11-24 DIAGNOSIS — J45909 Unspecified asthma, uncomplicated: Secondary | ICD-10-CM | POA: Insufficient documentation

## 2011-11-24 DIAGNOSIS — G8929 Other chronic pain: Secondary | ICD-10-CM | POA: Insufficient documentation

## 2011-11-24 LAB — COMPREHENSIVE METABOLIC PANEL
Albumin: 4.6 g/dL (ref 3.5–5.2)
Alkaline Phosphatase: 67 U/L (ref 39–117)
BUN: 14 mg/dL (ref 6–23)
CO2: 25 mEq/L (ref 19–32)
Chloride: 104 mEq/L (ref 96–112)
Creatinine, Ser: 0.62 mg/dL (ref 0.50–1.10)
GFR calc non Af Amer: >90 mL/min (ref >90)
Glucose, Bld: 97 mg/dL (ref 70–99)
Potassium: 3.6 mEq/L (ref 3.5–5.1)
Total Bilirubin: 0.6 mg/dL (ref 0.3–1.2)

## 2011-11-24 LAB — CBC WITH DIFFERENTIAL/PLATELET
HCT: 40.9 % (ref 36.0–46.0)
Hemoglobin: 13.9 g/dL (ref 12.0–15.0)
Lymphocytes Relative: 28 % (ref 12–46)
Lymphs Abs: 2.7 10*3/uL (ref 0.7–4.0)
MCHC: 34 g/dL (ref 30.0–36.0)
Monocytes Absolute: 0.5 10*3/uL (ref 0.1–1.0)
Monocytes Relative: 5 % (ref 3–12)
Neutro Abs: 6.5 10*3/uL (ref 1.7–7.7)
Neutrophils Relative %: 67 % (ref 43–77)
RBC: 4.74 MIL/uL (ref 3.87–5.11)
WBC: 9.7 10*3/uL (ref 4.0–10.5)

## 2011-11-24 LAB — URINALYSIS, ROUTINE W REFLEX MICROSCOPIC
Glucose, UA: NEGATIVE mg/dL
Ketones, ur: 15 mg/dL — AB
Protein, ur: NEGATIVE mg/dL
Urobilinogen, UA: 0.2 mg/dL (ref 0.0–1.0)

## 2011-11-24 LAB — LIPASE, BLOOD: Lipase: 41 U/L (ref 11–59)

## 2011-11-24 LAB — RAPID URINE DRUG SCREEN, HOSP PERFORMED
Barbiturates: NOT DETECTED
Benzodiazepines: NOT DETECTED
Cocaine: NOT DETECTED
Tetrahydrocannabinol: NOT DETECTED

## 2011-11-24 LAB — URINE MICROSCOPIC-ADD ON

## 2011-11-24 LAB — TROPONIN I: Troponin I: <0.3 ng/mL (ref <0.30)

## 2011-11-24 MED ORDER — MORPHINE SULFATE 4 MG/ML IJ SOLN
4.0000 mg | Freq: Once | INTRAMUSCULAR | Status: AC
  Administered 2011-11-24: 4 mg via INTRAVENOUS
  Filled 2011-11-24: qty 1

## 2011-11-24 MED ORDER — SODIUM CHLORIDE 0.9 % IV BOLUS (SEPSIS)
1000.0000 mL | Freq: Once | INTRAVENOUS | Status: AC
  Administered 2011-11-24: 1000 mL via INTRAVENOUS

## 2011-11-24 MED ORDER — KETOROLAC TROMETHAMINE 30 MG/ML IJ SOLN
30.0000 mg | Freq: Once | INTRAMUSCULAR | Status: AC
  Administered 2011-11-24: 30 mg via INTRAVENOUS
  Filled 2011-11-24: qty 1

## 2011-11-24 MED ORDER — ONDANSETRON HCL 4 MG PO TABS: 4.0000 mg | ORAL_TABLET | Freq: Four times a day (QID) | ORAL | Status: DC

## 2011-11-24 MED ORDER — ONDANSETRON HCL 4 MG/2ML IJ SOLN
INTRAMUSCULAR | Status: AC
  Administered 2011-11-24: 19:00:00
  Filled 2011-11-24: qty 2

## 2011-11-24 MED ORDER — HYDROCODONE-ACETAMINOPHEN 5-325 MG PO TABS: 1.0000 | ORAL_TABLET | ORAL | Status: DC | PRN

## 2011-11-24 MED ORDER — ONDANSETRON HCL 4 MG/2ML IJ SOLN
4.0000 mg | Freq: Once | INTRAMUSCULAR | Status: AC
  Administered 2011-11-24: 4 mg via INTRAVENOUS
  Filled 2011-11-24: qty 2

## 2011-11-24 NOTE — ED Notes (Signed)
Pt to department via EMS- reports n/v that started yesterday. Also reports abd pain. States hx of kidney stones. No distress on arrival. Bp-160/90 Hr-90

## 2011-11-24 NOTE — ED Provider Notes (Signed)
History     CSN: [id number]  Arrival date & time 11/24/11  1816   First MD Initiated Contact with Patient 11/24/11 1846      Chief Complaint  Patient presents with  . Nausea  . Emesis    (Consider location/radiation/quality/duration/timing/severity/associated sxs/prior treatment) HPI Pt with chronic back pain p/w acute worsening of back pain with N/V. Symptoms worsened today. Saw her PMD who ordered CT. Unknown results. Pt then went to chiropractor who called EMS. Pt Is poor historian. States the back pain radiates down her R leg. Prev imaging shows lumbar disc disease. No incontinence, weakness. Past Medical History  Diagnosis Date  . Schizophrenia     treated by Dr. Donell Beers  . Hypertension   . High cholesterol   . Heart murmur   . Asthma   . Pneumonia ~ 2009  . History of bronchitis     "last time ~ 2009, before I had pneumonia"  . Hypoglycemia   . Migraine     "used to have them often"  . Seizures     last seizure 3 years ago, not on AED  . Seizure 07/07/11  . Stroke 07/08/11    "I've had mini strokes before; left side of face  is more down than right "  . Chronic high back pain   . Kidney stone   . Anxiety   . PTSD (post-traumatic stress disorder)     Past Surgical History  Procedure Date  . Cochlear implant 2010    left  . Tonsillectomy     "as a child"  . Inguinal hernia repair 1985    left  . Cardiac catheterization     "not sure; I think I did"  . Kidney stone surgery ~ 2006    Family History  Problem Relation Age of Onset  . Heart attack Father 30    History  Substance Use Topics  . Smoking status: Former Smoker -- 1.0 packs/day for 20 years    Types: Cigarettes    Quit date: 02/29/2004  . Smokeless tobacco: Never Used  . Alcohol Use: Yes     07/08/11 "glass of wine q once in awhile; not very often; do it on special occasion"    OB History    Grav Para Term Preterm Abortions TAB SAB Ect Mult Living                  Review of Systems    Constitutional: Negative for fever and chills.  Respiratory: Negative for cough, shortness of breath and wheezing.   Cardiovascular: Negative for chest pain and palpitations.  Gastrointestinal: Positive for nausea and vomiting. Negative for abdominal pain, constipation and rectal pain.  Genitourinary: Negative for dysuria, flank pain and difficulty urinating.  Musculoskeletal: Positive for back pain and arthralgias. Negative for myalgias.  Skin: Negative for rash and wound.  Neurological: Negative for dizziness, weakness, light-headedness, numbness and headaches.    Allergies  Sulfa antibiotics and Codeine  Home Medications   Current Outpatient Rx  Name Route Sig Dispense Refill  . CANDESARTAN CILEXETIL-HCTZ 16-12.5 MG PO TABS Oral Take 0.5 tablets by mouth daily.    Marland Kitchen CIPROFLOXACIN HCL 500 MG PO TABS Oral Take 500 mg by mouth 2 (two) times daily.    Marland Kitchen HALOPERIDOL 1 MG PO TABS Oral Take 1 mg by mouth at bedtime.     . IBUPROFEN 200 MG PO TABS Oral Take 600 mg by mouth every 6 (six) hours as needed. For pain    .  LORAZEPAM 1 MG PO TABS Oral Take 1 mg by mouth every 8 (eight) hours as needed. For anxiety    . METAXALONE 800 MG PO TABS Oral Take 400-800 mg by mouth 3 (three) times daily as needed. For pain    . METOPROLOL TARTRATE 25 MG PO TABS Oral Take 25 mg by mouth 2 (two) times daily.    Marland Kitchen TEMAZEPAM 15 MG PO CAPS Oral Take 30 mg by mouth at bedtime as needed.    Marland Kitchen ZOLPIDEM TARTRATE 10 MG PO TABS Oral Take 20 mg by mouth at bedtime as needed.    Marland Kitchen HYDROCODONE-ACETAMINOPHEN 5-325 MG PO TABS Oral Take 1 tablet by mouth every 4 (four) hours as needed for pain. 10 tablet 0  . ONDANSETRON HCL 4 MG PO TABS Oral Take 1 tablet (4 mg total) by mouth every 6 (six) hours. 12 tablet 0    BP 130/64  Pulse 62  Temp 98.5 F (36.9 C) (Oral)  Resp 17  SpO2 97%  Physical Exam  Nursing note and vitals reviewed. Constitutional: She is oriented to person, place, and time. She appears  well-developed and well-nourished.  HENT:  Head: Normocephalic and atraumatic.  Mouth/Throat: Oropharynx is clear and moist.  Eyes: EOM are normal. Pupils are equal, round, and reactive to light.  Neck: Normal range of motion. Neck supple.  Cardiovascular: Normal rate and regular rhythm.   Pulmonary/Chest: Effort normal and breath sounds normal. No respiratory distress. She has no wheezes. She has no rales. She exhibits no tenderness.  Abdominal: Soft. Bowel sounds are normal. She exhibits no distension and no mass. There is no tenderness. There is no rebound and no guarding.  Musculoskeletal: Normal range of motion. She exhibits tenderness. She exhibits no edema.       TTP over lumbar spine and lumbar paraspinal muscles. +R knee raise. Neurovasc intact  Neurological: She is alert and oriented to person, place, and time.       Periodic generalized tremor. 5/5 LUE/RUE/LLE. Difficult to eval RLE due to lack of effort 2/2 to pain. Sensation intact  Skin: Skin is warm and dry. No rash noted. No erythema.  Psychiatric:       Bizarre behavior.      ED Course  Procedures (including critical care time)  Labs Reviewed  COMPREHENSIVE METABOLIC PANEL - Abnormal; Notable for the following:    Calcium 10.8 (*)     All other components within normal limits  URINALYSIS, ROUTINE W REFLEX MICROSCOPIC - Abnormal; Notable for the following:    APPearance CLOUDY (*)     Ketones, ur 15 (*)     Leukocytes, UA SMALL (*)     All other components within normal limits  URINE RAPID DRUG SCREEN (HOSP PERFORMED) - Abnormal; Notable for the following:    Opiates POSITIVE (*)     All other components within normal limits  URINE MICROSCOPIC-ADD ON - Abnormal; Notable for the following:    Squamous Epithelial / LPF FEW (*)     All other components within normal limits  CBC WITH DIFFERENTIAL  LIPASE, BLOOD  ETHANOL  TROPONIN I   Dg Lumbar Spine 2-3 Views  11/24/2011  *RADIOLOGY REPORT*  Clinical Data: Low  back pain.  Nausea.  LUMBAR SPINE - 2-3 VIEW  Comparison: Lumbar spine CT scan 07/26/2010.  Findings: Multiple views of the lumbar spine demonstrate no acute displaced fractures or compression type fractures.  There is multilevel degenerative disc disease, most severe at L1-L2.  Mild multilevel facet  arthropathy is also noted, most severe L5-S1. Alignment is anatomic.  IMPRESSION: 1.  No acute radiographic abnormality of the lumbar spine. 2.  Mild multilevel degenerative disc disease and lumbar spondylosis, similar to priors, as above.   Original Report Authenticated By: Florencia Reasons, M.D.      1. Radiculopathy   2. Nausea      Date: 11/24/2011  Rate: 72  Rhythm: normal sinus rhythm  QRS Axis: normal  Intervals: normal  ST/T Wave abnormalities: normal  Conduction Disutrbances:none  Narrative Interpretation:   Old EKG Reviewed: unchanged    MDM  Likely radiculopathy. No concerning findings for more serious causes of pain.  Pt states she is feeling better. Tolerating PO's. F/u with PMD. Return for concerns       Loren Racer, MD 11/24/11 2203

## 2011-11-30 DIAGNOSIS — G8929 Other chronic pain: Secondary | ICD-10-CM | POA: Insufficient documentation

## 2011-11-30 DIAGNOSIS — M545 Low back pain: Secondary | ICD-10-CM | POA: Insufficient documentation

## 2012-05-26 IMAGING — RF IR MYELOGRAM [PERSON_NAME]
12 of 24 series · 12 of 24 positions shown · IV contrast (omnipaque)
Comparison: None.

CLINICAL DATA: Low back pain extending into the right hip and
buttock.  Neck pain.

MYELOGRAM INJECTION
TECHNIQUE: Informed consent was obtained from the patient prior to
the procedure, including potential complications of headache,
allergy, infection and pain.  A timeout procedure was performed.
With the patient prone, the lower back was prepped with Betadine.
1% Lidocaine was used for local anesthesia.  Lumbar puncture was
performed at the left paramidline L2-3 level using a 22 gauge
needle with return of clear CSF.  10 ml of Omnipaque 633was
injected into the subarachnoid space .
TECHNIQUE: Following injection of intrathecal Omnipaque contrast,
spine imaging in multiple projections was performed using
fluoroscopy.
Fluoroscopy Time: 3.03 minutes.
TECHNIQUE: CT imaging of the cervical spine was performed after
intrathecal contrast administration. Multiplanar CT image
reconstructions were also generated.
TECHNIQUE: CT imaging of the lumbar spine was performed after
intrathecal contrast administration.  Multiplanar CT image

[Series 2: myelogram  white · 1 of 1 slices shown (1 of 11)]
[im 1/1]
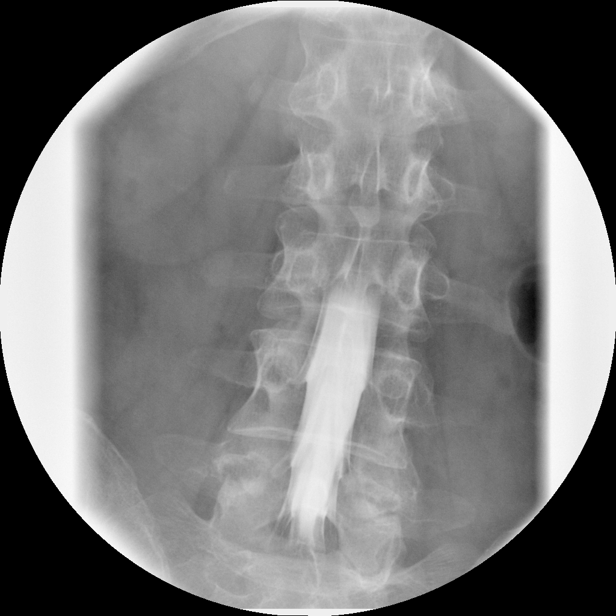

[Series 4: myelogram  white · 1 of 1 slices shown (2 of 11)]
[im 1/1]
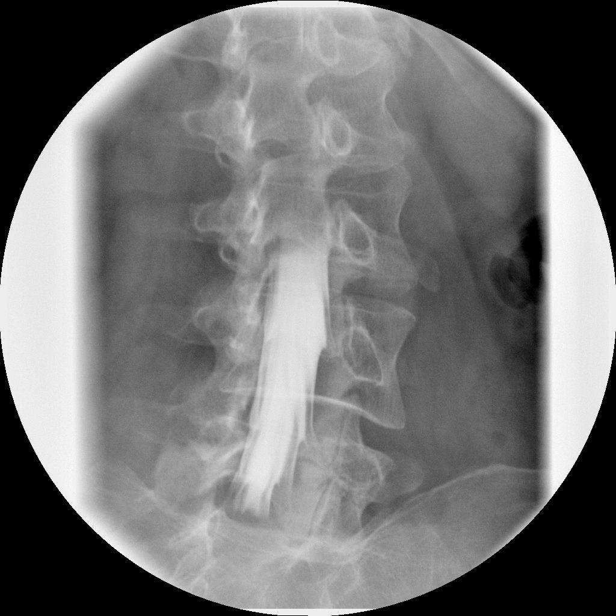

[Series 6: myelogram  white · 1 of 1 slices shown (3 of 11)]
[im 1/1]
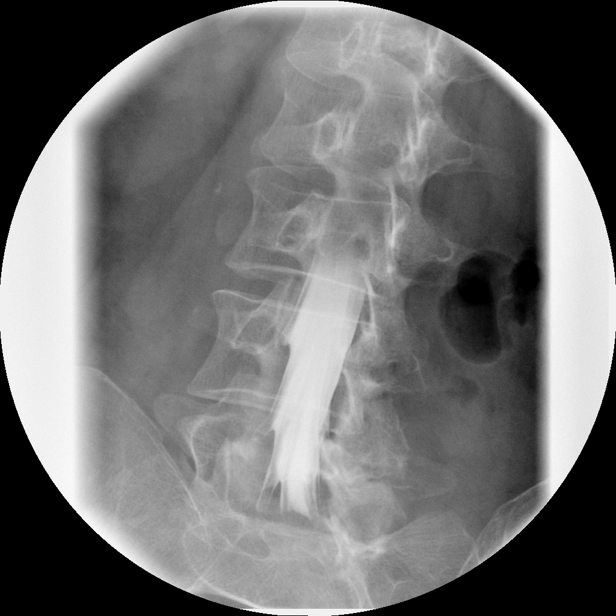

[Series 8: myelogram  white · 1 of 1 slices shown (4 of 11)]
[im 1/1]
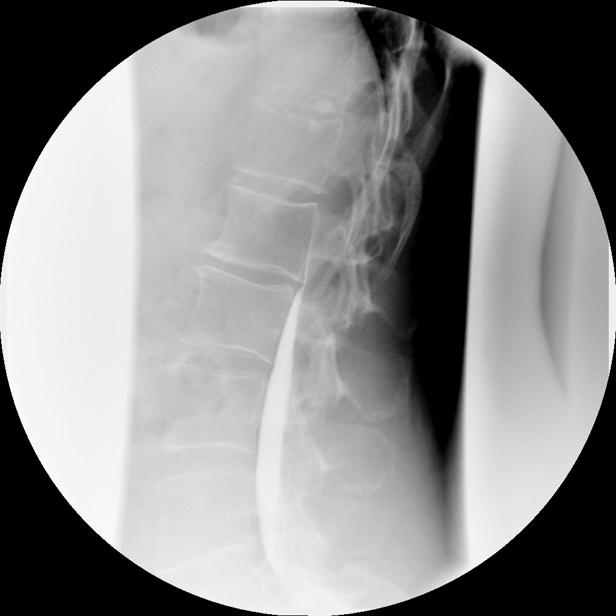

[Series 10: myelogram  white · 1 of 1 slices shown (5 of 11)]
[im 1/1]
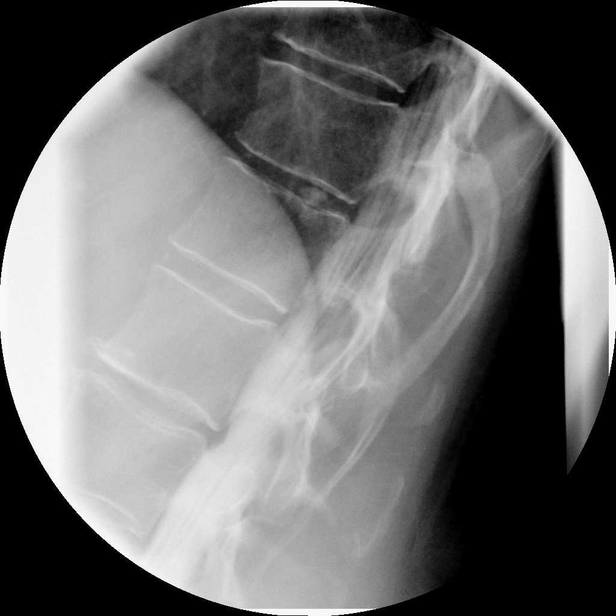

[Series 12: myelogram  white · 1 of 1 slices shown (6 of 11)]
[im 1/1]
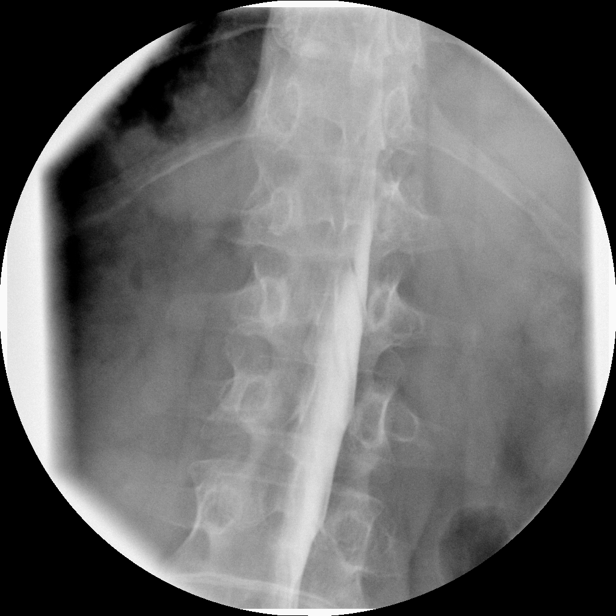

[Series 14: myelogram  white · 1 of 1 slices shown (7 of 11)]
[im 1/1]
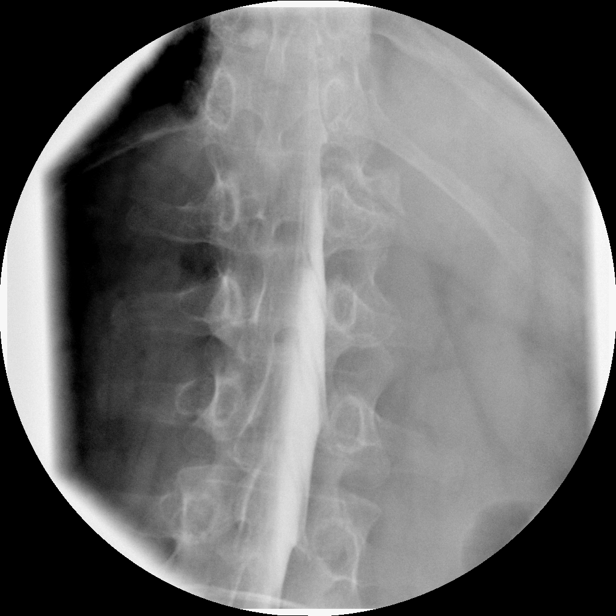

[Series 17: myelogram  white · 1 of 1 slices shown (8 of 11)]
[im 1/1]
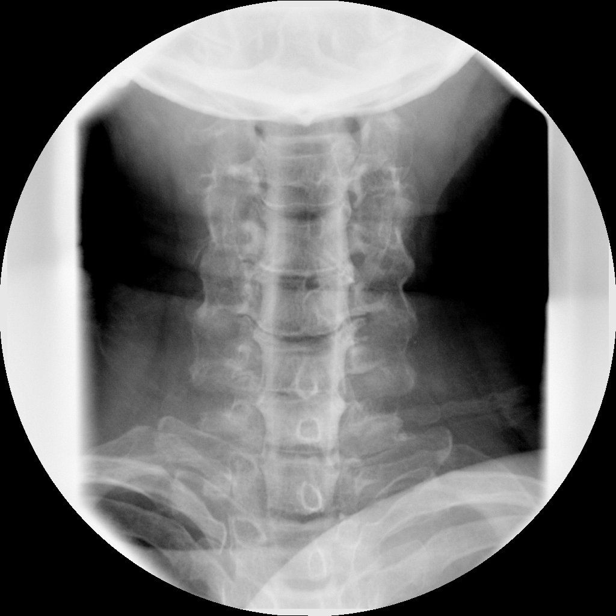

[Series 19: myelogram  white · 1 of 1 slices shown (9 of 11)]
[im 1/1]
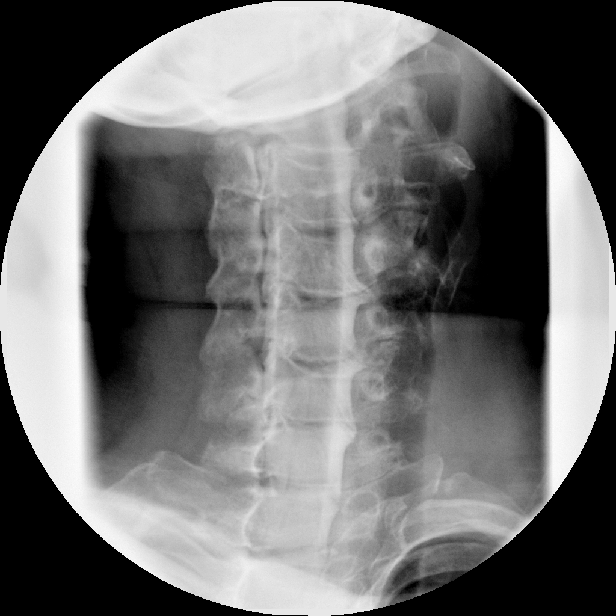

[Series 21: myelogram  white · 1 of 1 slices shown (10 of 11)]
[im 1/1]
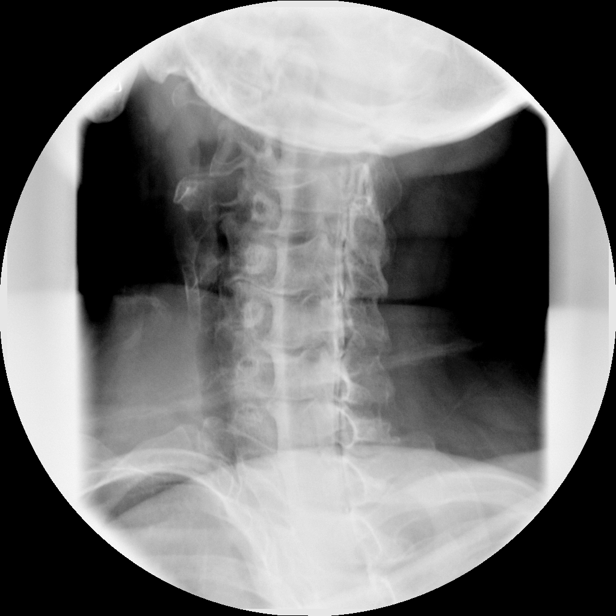

[Series 23: myelogram  white · 1 of 1 slices shown (11 of 11)]
[im 1/1]
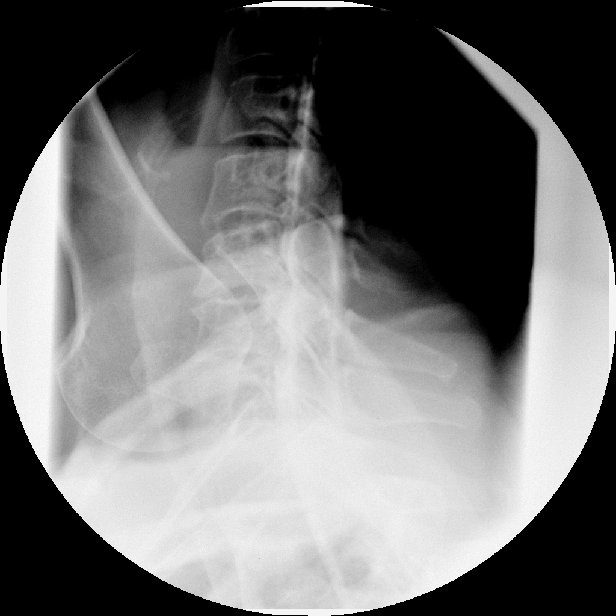

[Series 1002: view not recorded · 0.20mm/px · 1 of 1 slices shown]
[im 1/1]
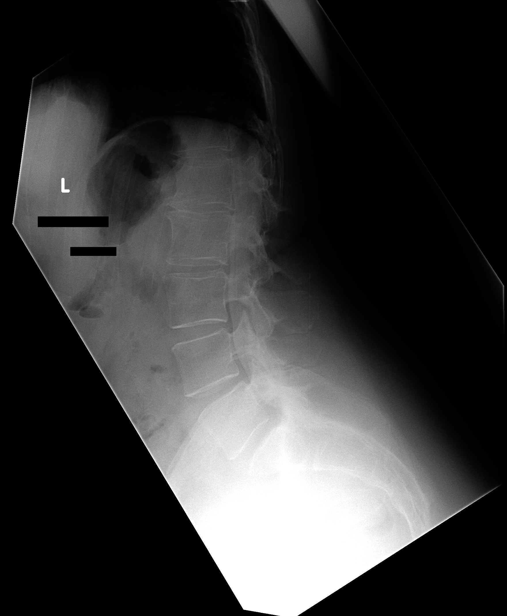

[12 of 24 positions shown; findings below may reference images not displayed]

IMPRESSION: Successful injection of  intrathecal contrast for myelography.

MYELOGRAM CERVICAL AND LUMBAR
FINDINGS: The lumbar nerve roots in the lower lumbar spine fill
normally.  The upper lumbar nerve roots fill as well.  There is a
small disc bulge at L1-2.  The vertebral body heights alignment
maintained otherwise.  There is some loss of disc height at L1-2 as
well.

The cervical nerve roots fill appropriately on both sides.  The
lateral images demonstrate a disc osteophyte complex at C5-6 and to
a lesser extent at C6-7 creating ventral impression.

The upright views of the cervical spine demonstrate no significant
change in the L1-2 disc bulge.  There is no abnormal motion with
flexion or extension.
IMPRESSION: 1.  Small disc bulge and slight loss of disc height at L1-2.
2.  Disc osteophyte complex with a prominent ventral impression at
C5-6 and to a lesser extent at C6-7.

CT MYELOGRAPHY CERVICAL SPINE
FINDINGS: The cervical spine is imaged from the skull base through
the midbody of T3.  The vertebral body heights are maintained.
Alignment is anatomic.  There is some straightening of the normal
cervical lordosis.

C2-3:  There is fusion of the facet joints on the right with some
associated hypertrophy.  No focal stenosis is evident.

C3-4:  Asymmetric right-sided facet hypertrophy is present.  Mild
uncovertebral spurring is noted bilaterally.  This leads to mild
foraminal narrowing, worse on the right.

C4-5:  A mild disc osteophyte complex is present.  There is
asymmetric uncovertebral disease on the left.  This leads to mild
left foraminal narrowing.

C5-6:  A broad-based disc osteophyte complex is present.
Asymmetric right-sided uncovertebral disease is present.  Mild
right foraminal stenosis results.

C6-7:  Asymmetric left-sided uncovertebral disease is present.
Mild left foraminal narrowing results.  There is a mild disc
osteophyte complex as well, partially effacing the ventral CSF.

C7-T1:  Negative.
IMPRESSION: 1.  A disc osteophyte complex is present at C4-5, C5-6, and C6-7
with partial effacement of the ventral CSF at each level.
2.  Fusion of the posterior elements on the right at C2-3 without
focal stenosis.
3.  Mild bilateral foraminal narrowing at C3-4, worse on the right.
4.  Mild left foraminal narrowing at C4-5.
5.  Mild right foraminal narrowing at C5-6.
6.  Mild left foraminal narrowing at C6-7.

CT MYELOGRAPHY LUMBAR SPINE
FINDINGS: The lumbar spine is imaged from T11-12 through S3.  The
vertebral body heights and alignment maintained.  Limited imaging
of the abdomen is unremarkable.  The individual disc levels are as
follows.

L1-2:  Minimal disc bulge is present.  No focal stenosis is
evident.

L2-3:  Negative.

L3-4:  Mild facet hypertrophy is present bilaterally.  No focal
stenosis is evident.

L4-5:  A broad-based disc bulge is present.  Mild facet hypertrophy
is noted bilaterally.  No focal stenosis evident. Heterogeneous
bone growth is seen at the posterior aspect of the right facet.

L5-S1:  Mild facet hypertrophy is present.  There is some
prominence of the epidural fat.
IMPRESSION: 1.  Minimal disc bulge at L1-2 without significant stenosis.
2.  Mild facet degenerative changes at L4-5 and L5-S1 without
significant stenosis.
3.  Mild epidural lipomatosis.  The thecal sac appears to be of
relatively normal size.

## 2012-09-19 DIAGNOSIS — E669 Obesity, unspecified: Secondary | ICD-10-CM | POA: Insufficient documentation

## 2013-05-12 DIAGNOSIS — N281 Cyst of kidney, acquired: Secondary | ICD-10-CM | POA: Insufficient documentation

## 2013-05-12 HISTORY — DX: Cyst of kidney, acquired: N28.1

## 2013-07-03 ENCOUNTER — Ambulatory Visit: Payer: BC Managed Care – PPO | Attending: Sports Medicine | Admitting: Rehabilitation

## 2013-07-03 DIAGNOSIS — I10 Essential (primary) hypertension: Secondary | ICD-10-CM | POA: Insufficient documentation

## 2013-07-03 DIAGNOSIS — M545 Low back pain, unspecified: Secondary | ICD-10-CM | POA: Diagnosis not present

## 2013-07-03 DIAGNOSIS — J45909 Unspecified asthma, uncomplicated: Secondary | ICD-10-CM | POA: Insufficient documentation

## 2013-07-03 DIAGNOSIS — IMO0001 Reserved for inherently not codable concepts without codable children: Secondary | ICD-10-CM | POA: Diagnosis not present

## 2013-07-03 DIAGNOSIS — M549 Dorsalgia, unspecified: Secondary | ICD-10-CM | POA: Insufficient documentation

## 2013-07-03 DIAGNOSIS — M25569 Pain in unspecified knee: Secondary | ICD-10-CM | POA: Insufficient documentation

## 2013-07-03 DIAGNOSIS — Z9689 Presence of other specified functional implants: Secondary | ICD-10-CM | POA: Diagnosis not present

## 2013-07-08 ENCOUNTER — Ambulatory Visit: Payer: BC Managed Care – PPO | Admitting: Rehabilitation

## 2013-07-08 DIAGNOSIS — IMO0001 Reserved for inherently not codable concepts without codable children: Secondary | ICD-10-CM | POA: Diagnosis not present

## 2013-07-11 ENCOUNTER — Ambulatory Visit: Payer: BC Managed Care – PPO | Admitting: Rehabilitation

## 2013-07-11 DIAGNOSIS — IMO0001 Reserved for inherently not codable concepts without codable children: Secondary | ICD-10-CM | POA: Diagnosis not present

## 2013-07-15 ENCOUNTER — Ambulatory Visit: Payer: BC Managed Care – PPO | Admitting: Rehabilitation

## 2013-07-15 DIAGNOSIS — IMO0001 Reserved for inherently not codable concepts without codable children: Secondary | ICD-10-CM | POA: Diagnosis not present

## 2013-07-18 ENCOUNTER — Ambulatory Visit: Payer: BC Managed Care – PPO | Admitting: Rehabilitation

## 2013-07-18 ENCOUNTER — Emergency Department (HOSPITAL_BASED_OUTPATIENT_CLINIC_OR_DEPARTMENT_OTHER)
Admission: EM | Admit: 2013-07-18 | Discharge: 2013-07-19 | Disposition: A | Discharge status: Home / Self Care | Payer: BC Managed Care – PPO | Attending: Emergency Medicine | Admitting: Emergency Medicine

## 2013-07-18 DIAGNOSIS — G8929 Other chronic pain: Secondary | ICD-10-CM | POA: Insufficient documentation

## 2013-07-18 DIAGNOSIS — F411 Generalized anxiety disorder: Secondary | ICD-10-CM | POA: Insufficient documentation

## 2013-07-18 DIAGNOSIS — E7800 Pure hypercholesterolemia, unspecified: Secondary | ICD-10-CM | POA: Insufficient documentation

## 2013-07-18 DIAGNOSIS — Z8673 Personal history of transient ischemic attack (TIA), and cerebral infarction without residual deficits: Secondary | ICD-10-CM | POA: Insufficient documentation

## 2013-07-18 DIAGNOSIS — Z8669 Personal history of other diseases of the nervous system and sense organs: Secondary | ICD-10-CM | POA: Insufficient documentation

## 2013-07-18 DIAGNOSIS — R51 Headache: Secondary | ICD-10-CM | POA: Insufficient documentation

## 2013-07-18 DIAGNOSIS — I10 Essential (primary) hypertension: Secondary | ICD-10-CM | POA: Insufficient documentation

## 2013-07-18 DIAGNOSIS — K117 Disturbances of salivary secretion: Secondary | ICD-10-CM | POA: Insufficient documentation

## 2013-07-18 DIAGNOSIS — Z792 Long term (current) use of antibiotics: Secondary | ICD-10-CM | POA: Insufficient documentation

## 2013-07-18 DIAGNOSIS — R112 Nausea with vomiting, unspecified: Secondary | ICD-10-CM

## 2013-07-18 DIAGNOSIS — Z79899 Other long term (current) drug therapy: Secondary | ICD-10-CM | POA: Insufficient documentation

## 2013-07-18 DIAGNOSIS — Z8659 Personal history of other mental and behavioral disorders: Secondary | ICD-10-CM | POA: Insufficient documentation

## 2013-07-18 DIAGNOSIS — R011 Cardiac murmur, unspecified: Secondary | ICD-10-CM | POA: Insufficient documentation

## 2013-07-18 DIAGNOSIS — J45909 Unspecified asthma, uncomplicated: Secondary | ICD-10-CM | POA: Insufficient documentation

## 2013-07-18 DIAGNOSIS — Z87891 Personal history of nicotine dependence: Secondary | ICD-10-CM | POA: Insufficient documentation

## 2013-07-18 DIAGNOSIS — R1013 Epigastric pain: Secondary | ICD-10-CM | POA: Insufficient documentation

## 2013-07-18 DIAGNOSIS — Z8701 Personal history of pneumonia (recurrent): Secondary | ICD-10-CM | POA: Insufficient documentation

## 2013-07-18 DIAGNOSIS — Z87442 Personal history of urinary calculi: Secondary | ICD-10-CM | POA: Insufficient documentation

## 2013-07-19 LAB — URINALYSIS, ROUTINE W REFLEX MICROSCOPIC
Bilirubin Urine: NEGATIVE
GLUCOSE, UA: NEGATIVE mg/dL
KETONES UR: 15 mg/dL — AB
Leukocytes, UA: NEGATIVE
Nitrite: NEGATIVE
PH: 6.5 (ref 5.0–8.0)
PROTEIN: NEGATIVE mg/dL
Specific Gravity, Urine: 1.012 (ref 1.005–1.030)
Urobilinogen, UA: 0.2 mg/dL (ref 0.0–1.0)

## 2013-07-19 LAB — URINE MICROSCOPIC-ADD ON

## 2013-07-19 LAB — COMPREHENSIVE METABOLIC PANEL
ALBUMIN: 4.2 g/dL (ref 3.5–5.2)
ALT: 22 U/L (ref 0–35)
AST: 15 U/L (ref 0–37)
Alkaline Phosphatase: 55 U/L (ref 39–117)
BILIRUBIN TOTAL: 0.8 mg/dL (ref 0.3–1.2)
BUN: 22 mg/dL (ref 6–23)
CHLORIDE: 101 meq/L (ref 96–112)
CO2: 26 meq/L (ref 19–32)
Calcium: 10.7 mg/dL — ABNORMAL HIGH (ref 8.4–10.5)
Creatinine, Ser: 0.7 mg/dL (ref 0.50–1.10)
GFR calc Af Amer: >90 mL/min (ref >90)
Glucose, Bld: 158 mg/dL — ABNORMAL HIGH (ref 70–99)
POTASSIUM: 4.4 meq/L (ref 3.7–5.3)
SODIUM: 142 meq/L (ref 137–147)
Total Protein: 7.2 g/dL (ref 6.0–8.3)

## 2013-07-19 LAB — CBC WITH DIFFERENTIAL/PLATELET
BASOS PCT: 0 % (ref 0–1)
Basophils Absolute: 0 10*3/uL (ref 0.0–0.1)
EOS PCT: 1 % (ref 0–5)
Eosinophils Absolute: 0.2 10*3/uL (ref 0.0–0.7)
HCT: 42.8 % (ref 36.0–46.0)
HEMOGLOBIN: 14.2 g/dL (ref 12.0–15.0)
LYMPHS PCT: 17 % (ref 12–46)
Lymphs Abs: 2.6 10*3/uL (ref 0.7–4.0)
MCH: 29.2 pg (ref 26.0–34.0)
MCHC: 33.2 g/dL (ref 30.0–36.0)
MCV: 88.1 fL (ref 78.0–100.0)
MONOS PCT: 4 % (ref 3–12)
Monocytes Absolute: 0.6 10*3/uL (ref 0.1–1.0)
NEUTROS PCT: 78 % — AB (ref 43–77)
Neutro Abs: 11.9 10*3/uL — ABNORMAL HIGH (ref 1.7–7.7)
Platelets: 219 10*3/uL (ref 150–400)
RBC: 4.86 MIL/uL (ref 3.87–5.11)
RDW: 15.7 % — ABNORMAL HIGH (ref 11.5–15.5)
WBC: 15.3 10*3/uL — AB (ref 4.0–10.5)

## 2013-07-19 LAB — LIPASE, BLOOD: Lipase: 51 U/L (ref 11–59)

## 2013-07-19 MED ORDER — SODIUM CHLORIDE 0.9 % IV BOLUS (SEPSIS)
1000.0000 mL | Freq: Once | INTRAVENOUS | Status: AC
  Administered 2013-07-19: 1000 mL via INTRAVENOUS

## 2013-07-19 MED ORDER — METOCLOPRAMIDE HCL 5 MG/ML IJ SOLN: 10.0000 mg | Freq: Once | INTRAMUSCULAR | Status: DC

## 2013-07-19 MED ORDER — ONDANSETRON 8 MG PO TBDP: 8.0000 mg | ORAL_TABLET | Freq: Three times a day (TID) | ORAL | Status: DC | PRN

## 2013-07-19 MED ORDER — ONDANSETRON HCL 4 MG/2ML IJ SOLN
INTRAMUSCULAR | Status: AC
  Administered 2013-07-19: 4 mg via INTRAVENOUS
  Filled 2013-07-19: qty 2

## 2013-07-19 MED ORDER — ONDANSETRON HCL 4 MG/2ML IJ SOLN
4.0000 mg | Freq: Once | INTRAMUSCULAR | Status: AC
  Administered 2013-07-19: 4 mg via INTRAVENOUS

## 2013-07-19 MED ORDER — ACETAMINOPHEN 325 MG PO TABS
650.0000 mg | ORAL_TABLET | Freq: Once | ORAL | Status: AC
  Administered 2013-07-19: 650 mg via ORAL
  Filled 2013-07-19: qty 2

## 2013-07-19 MED ORDER — METOCLOPRAMIDE HCL 5 MG/ML IJ SOLN
5.0000 mg | Freq: Once | INTRAMUSCULAR | Status: AC
  Administered 2013-07-19: 5 mg via INTRAVENOUS
  Filled 2013-07-19: qty 2

## 2013-07-19 NOTE — ED Provider Notes (Signed)
CSN: [id number]     Arrival date & time 07/18/13  2356 History   First MD Initiated Contact with Patient 07/19/13 0155     Chief Complaint  Patient presents with  . Vomiting      (Consider location/radiation/quality/duration/timing/severity/associated sxs/prior Treatment) HPI 58 year old female who complains of nausea and vomiting since 6 PM yesterday evening. She states she has vomited 16 times. She denies fever or diarrhea. She does have a dry mouth. After the vomiting she developed epigastric pain which she describes as crampy. She was given Zofran 4 mg IV and 1 L normal saline IV bolus prior to my evaluation with partial relief of her symptoms. She is also complaining of a headache.  Past Medical History  Diagnosis Date  . Schizophrenia     treated by Dr. Casimiro Needle  . Hypertension   . High cholesterol   . Heart murmur   . Asthma   . Pneumonia ~ 2009  . History of bronchitis     "last time ~ 2009, before I had pneumonia"  . Hypoglycemia   . Migraine     "used to have them often"  . Seizures     last seizure 3 years ago, not on AED  . Seizure 07/07/11  . Stroke 07/08/11    "I've had mini strokes before; left side of face  is more down than right "  . Chronic high back pain   . Kidney stone   . Anxiety   . PTSD (post-traumatic stress disorder)    Past Surgical History  Procedure Laterality Date  . Cochlear implant  2010    left  . Tonsillectomy      "as a child"  . Inguinal hernia repair  1985    left  . Cardiac catheterization      "not sure; I think I did"  . Kidney stone surgery  ~ 2006   Family History  Problem Relation Age of Onset  . Heart attack Father 56   History  Substance Use Topics  . Smoking status: Former Smoker -- 1.00 packs/day for 20 years    Types: Cigarettes    Quit date: 02/29/2004  . Smokeless tobacco: Never Used  . Alcohol Use: Yes     Comment: 07/08/11 "glass of wine q once in awhile; not very often; do it on special occasion"   OB  History   Grav Para Term Preterm Abortions TAB SAB Ect Mult Living                 Review of Systems  All other systems reviewed and are negative.  Allergies  Sulfa antibiotics; Benadryl; and Codeine  Home Medications   Prior to Admission medications   Medication Sig Start Date End Date Taking? Authorizing Provider  candesartan-hydrochlorothiazide (ATACAND HCT) 16-12.5 MG per tablet Take 0.5 tablets by mouth daily.    Historical Provider, MD  ciprofloxacin (CIPRO) 500 MG tablet Take 500 mg by mouth 2 (two) times daily.    Historical Provider, MD  haloperidol (HALDOL) 1 MG tablet Take 1 mg by mouth at bedtime.     Historical Provider, MD  HYDROcodone-acetaminophen (NORCO/VICODIN) 5-325 MG per tablet Take 1 tablet by mouth every 4 (four) hours as needed for pain. 11/24/11   Julianne Rice, MD  ibuprofen (ADVIL,MOTRIN) 200 MG tablet Take 600 mg by mouth every 6 (six) hours as needed. For pain    Historical Provider, MD  LORazepam (ATIVAN) 1 MG tablet Take 1 mg by mouth every 8 (eight)  hours as needed. For anxiety    Historical Provider, MD  metaxalone (SKELAXIN) 800 MG tablet Take 400-800 mg by mouth 3 (three) times daily as needed. For pain    Historical Provider, MD  metoprolol tartrate (LOPRESSOR) 25 MG tablet Take 25 mg by mouth 2 (two) times daily.    Historical Provider, MD  ondansetron (ZOFRAN) 4 MG tablet Take 1 tablet (4 mg total) by mouth every 6 (six) hours. 11/24/11   Julianne Rice, MD  temazepam (RESTORIL) 15 MG capsule Take 30 mg by mouth at bedtime as needed.    Historical Provider, MD  zolpidem (AMBIEN) 10 MG tablet Take 20 mg by mouth at bedtime as needed.    Historical Provider, MD   BP 189/66  Pulse 73  Temp(Src) 97.6 F (36.4 C) (Oral)  Resp 18  Ht 5' 3.5" (1.613 m)  Wt 195 lb (88.451 kg)  BMI 34.00 kg/m2  SpO2 95%  Physical Exam General: Well-developed, well-nourished female in no acute distress; appearance consistent with age of record HENT: normocephalic;  atraumatic; mucous membranes dry Eyes: pupils equal, round and reactive to light; extraocular muscles intact Neck: supple Heart: regular rate and rhythm Lungs: clear to auscultation bilaterally Abdomen: soft; nondistended; epigastric tenderness; no masses or hepatosplenomegaly; bowel sounds present Extremities: No deformity; full range of motion; pulses normal; no edema Neurologic: Awake, alert and oriented; motor function intact in all extremities and symmetric; no facial droop; choreoathetotic movements of lips and tongue Skin: Warm and dry Psychiatric: Normal mood and affect    ED Course  Procedures (including critical care time)  MDM   Nursing notes and vitals signs, including pulse oximetry, reviewed.  Summary of this visit's results, reviewed by myself:  Labs:  Results for orders placed during the hospital encounter of 07/18/13 (from the past 24 hour(s))  CBC WITH DIFFERENTIAL     Status: Abnormal   Collection Time    07/19/13 12:50 AM      Result Value Ref Range   WBC 15.3 (*) 4.0 - 10.5 K/uL   RBC 4.86  3.87 - 5.11 MIL/uL   Hemoglobin 14.2  12.0 - 15.0 g/dL   HCT 42.8  36.0 - 46.0 %   MCV 88.1  78.0 - 100.0 fL   MCH 29.2  26.0 - 34.0 pg   MCHC 33.2  30.0 - 36.0 g/dL   RDW 15.7 (*) 11.5 - 15.5 %   Platelets 219  150 - 400 K/uL   Neutrophils Relative % 78 (*) 43 - 77 %   Lymphocytes Relative 17  12 - 46 %   Monocytes Relative 4  3 - 12 %   Eosinophils Relative 1  0 - 5 %   Basophils Relative 0  0 - 1 %   Neutro Abs 11.9 (*) 1.7 - 7.7 K/uL   Lymphs Abs 2.6  0.7 - 4.0 K/uL   Monocytes Absolute 0.6  0.1 - 1.0 K/uL   Eosinophils Absolute 0.2  0.0 - 0.7 K/uL   Basophils Absolute 0.0  0.0 - 0.1 K/uL   WBC Morphology ATYPICAL LYMPHOCYTES     Smear Review PLATELET COUNT CONFIRMED BY SMEAR    COMPREHENSIVE METABOLIC PANEL     Status: Abnormal   Collection Time    07/19/13 12:50 AM      Result Value Ref Range   Sodium 142  137 - 147 mEq/L   Potassium 4.4  3.7 - 5.3  mEq/L   Chloride 101  96 - 112 mEq/L  CO2 26  19 - 32 mEq/L   Glucose, Bld 158 (*) 70 - 99 mg/dL   BUN 22  6 - 23 mg/dL   Creatinine, Ser 0.70  0.50 - 1.10 mg/dL   Calcium 10.7 (*) 8.4 - 10.5 mg/dL   Total Protein 7.2  6.0 - 8.3 g/dL   Albumin 4.2  3.5 - 5.2 g/dL   AST 15  0 - 37 U/L   ALT 22  0 - 35 U/L   Alkaline Phosphatase 55  39 - 117 U/L   Total Bilirubin 0.8  0.3 - 1.2 mg/dL   GFR calc non Af Amer >90  >90 mL/min   GFR calc Af Amer >90  >90 mL/min  LIPASE, BLOOD     Status: None   Collection Time    07/19/13 12:50 AM      Result Value Ref Range   Lipase 51  11 - 59 U/L  URINALYSIS, ROUTINE W REFLEX MICROSCOPIC     Status: Abnormal   Collection Time    07/19/13  2:25 AM      Result Value Ref Range   Color, Urine YELLOW  YELLOW   APPearance CLEAR  CLEAR   Specific Gravity, Urine 1.012  1.005 - 1.030   pH 6.5  5.0 - 8.0   Glucose, UA NEGATIVE  NEGATIVE mg/dL   Hgb urine dipstick SMALL (*) NEGATIVE   Bilirubin Urine NEGATIVE  NEGATIVE   Ketones, ur 15 (*) NEGATIVE mg/dL   Protein, ur NEGATIVE  NEGATIVE mg/dL   Urobilinogen, UA 0.2  0.0 - 1.0 mg/dL   Nitrite NEGATIVE  NEGATIVE   Leukocytes, UA NEGATIVE  NEGATIVE  URINE MICROSCOPIC-ADD ON     Status: None   Collection Time    07/19/13  2:25 AM      Result Value Ref Range   Squamous Epithelial / LPF RARE  RARE   WBC, UA 0-2  <3 WBC/hpf   RBC / HPF 0-2  <3 RBC/hpf   Bacteria, UA RARE  RARE   3:19 AM Patient feels better after 2 L normal saline. Drinking fluids without emesis.    Wynetta Fines, MD 07/19/13 [phone]

## 2013-07-19 NOTE — Discharge Instructions (Signed)

## 2013-07-19 NOTE — ED Notes (Signed)
Pt. Reports pain in mid abd. With vomiting x 16 times today starting at 1900.  No diarrhea per Pt.   Last normal BM was on Wednesday.

## 2013-07-23 ENCOUNTER — Ambulatory Visit: Payer: BC Managed Care – PPO | Admitting: Rehabilitation

## 2013-07-25 ENCOUNTER — Ambulatory Visit: Payer: BC Managed Care – PPO | Admitting: Rehabilitation

## 2013-07-26 ENCOUNTER — Other Ambulatory Visit: Payer: Self-pay | Admitting: Obstetrics & Gynecology

## 2013-07-29 ENCOUNTER — Ambulatory Visit: Payer: BC Managed Care – PPO | Admitting: Rehabilitation

## 2013-08-01 ENCOUNTER — Ambulatory Visit: Payer: BC Managed Care – PPO | Admitting: Rehabilitation

## 2013-08-07 ENCOUNTER — Ambulatory Visit: Payer: BC Managed Care – PPO | Attending: Sports Medicine | Admitting: Rehabilitation

## 2013-08-07 DIAGNOSIS — M545 Low back pain, unspecified: Secondary | ICD-10-CM | POA: Insufficient documentation

## 2013-08-07 DIAGNOSIS — M549 Dorsalgia, unspecified: Secondary | ICD-10-CM | POA: Insufficient documentation

## 2013-08-07 DIAGNOSIS — I10 Essential (primary) hypertension: Secondary | ICD-10-CM | POA: Insufficient documentation

## 2013-08-07 DIAGNOSIS — M25569 Pain in unspecified knee: Secondary | ICD-10-CM | POA: Diagnosis not present

## 2013-08-07 DIAGNOSIS — IMO0001 Reserved for inherently not codable concepts without codable children: Secondary | ICD-10-CM | POA: Diagnosis present

## 2013-08-07 DIAGNOSIS — J45909 Unspecified asthma, uncomplicated: Secondary | ICD-10-CM | POA: Insufficient documentation

## 2013-08-07 DIAGNOSIS — Z9689 Presence of other specified functional implants: Secondary | ICD-10-CM | POA: Diagnosis not present

## 2013-08-13 ENCOUNTER — Ambulatory Visit: Payer: BC Managed Care – PPO | Admitting: Rehabilitation

## 2013-08-13 DIAGNOSIS — IMO0001 Reserved for inherently not codable concepts without codable children: Secondary | ICD-10-CM | POA: Diagnosis not present

## 2013-08-16 ENCOUNTER — Ambulatory Visit: Payer: BC Managed Care – PPO | Admitting: Rehabilitation

## 2013-08-19 ENCOUNTER — Ambulatory Visit: Payer: BC Managed Care – PPO | Admitting: Rehabilitation

## 2013-08-19 DIAGNOSIS — M1990 Unspecified osteoarthritis, unspecified site: Secondary | ICD-10-CM | POA: Insufficient documentation

## 2013-08-19 DIAGNOSIS — IMO0001 Reserved for inherently not codable concepts without codable children: Secondary | ICD-10-CM | POA: Diagnosis not present

## 2013-08-19 DIAGNOSIS — M159 Polyosteoarthritis, unspecified: Secondary | ICD-10-CM | POA: Insufficient documentation

## 2013-08-22 ENCOUNTER — Ambulatory Visit: Payer: BC Managed Care – PPO | Admitting: Rehabilitation

## 2013-08-23 ENCOUNTER — Other Ambulatory Visit: Payer: Self-pay | Admitting: Sports Medicine

## 2013-08-23 DIAGNOSIS — M5416 Radiculopathy, lumbar region: Secondary | ICD-10-CM

## 2013-08-26 ENCOUNTER — Ambulatory Visit: Payer: BC Managed Care – PPO | Admitting: Rehabilitation

## 2013-08-29 ENCOUNTER — Ambulatory Visit: Payer: BC Managed Care – PPO | Admitting: Rehabilitation

## 2013-10-01 ENCOUNTER — Ambulatory Visit: Payer: BC Managed Care – PPO | Admitting: Family

## 2013-10-03 ENCOUNTER — Other Ambulatory Visit: Payer: Self-pay | Admitting: Dermatology

## 2013-11-12 ENCOUNTER — Emergency Department (HOSPITAL_BASED_OUTPATIENT_CLINIC_OR_DEPARTMENT_OTHER)
Admission: EM | Admit: 2013-11-12 | Discharge: 2013-11-12 | Disposition: A | Discharge status: Home / Self Care | Payer: BC Managed Care – PPO | Attending: Emergency Medicine | Admitting: Emergency Medicine

## 2013-11-12 ENCOUNTER — Emergency Department (HOSPITAL_BASED_OUTPATIENT_CLINIC_OR_DEPARTMENT_OTHER): Payer: BC Managed Care – PPO

## 2013-11-12 ENCOUNTER — Encounter (HOSPITAL_BASED_OUTPATIENT_CLINIC_OR_DEPARTMENT_OTHER): Payer: Self-pay | Admitting: Emergency Medicine

## 2013-11-12 DIAGNOSIS — Z79899 Other long term (current) drug therapy: Secondary | ICD-10-CM | POA: Insufficient documentation

## 2013-11-12 DIAGNOSIS — G8929 Other chronic pain: Secondary | ICD-10-CM | POA: Insufficient documentation

## 2013-11-12 DIAGNOSIS — Z8659 Personal history of other mental and behavioral disorders: Secondary | ICD-10-CM | POA: Diagnosis not present

## 2013-11-12 DIAGNOSIS — Z87442 Personal history of urinary calculi: Secondary | ICD-10-CM | POA: Insufficient documentation

## 2013-11-12 DIAGNOSIS — R110 Nausea: Secondary | ICD-10-CM | POA: Insufficient documentation

## 2013-11-12 DIAGNOSIS — I10 Essential (primary) hypertension: Secondary | ICD-10-CM | POA: Insufficient documentation

## 2013-11-12 DIAGNOSIS — Z791 Long term (current) use of non-steroidal anti-inflammatories (NSAID): Secondary | ICD-10-CM | POA: Insufficient documentation

## 2013-11-12 DIAGNOSIS — G43909 Migraine, unspecified, not intractable, without status migrainosus: Secondary | ICD-10-CM | POA: Insufficient documentation

## 2013-11-12 DIAGNOSIS — Z8701 Personal history of pneumonia (recurrent): Secondary | ICD-10-CM | POA: Diagnosis not present

## 2013-11-12 DIAGNOSIS — R1084 Generalized abdominal pain: Secondary | ICD-10-CM | POA: Diagnosis not present

## 2013-11-12 DIAGNOSIS — R011 Cardiac murmur, unspecified: Secondary | ICD-10-CM | POA: Insufficient documentation

## 2013-11-12 DIAGNOSIS — F411 Generalized anxiety disorder: Secondary | ICD-10-CM | POA: Insufficient documentation

## 2013-11-12 DIAGNOSIS — Z792 Long term (current) use of antibiotics: Secondary | ICD-10-CM | POA: Insufficient documentation

## 2013-11-12 DIAGNOSIS — R109 Unspecified abdominal pain: Secondary | ICD-10-CM | POA: Insufficient documentation

## 2013-11-12 DIAGNOSIS — Z8673 Personal history of transient ischemic attack (TIA), and cerebral infarction without residual deficits: Secondary | ICD-10-CM | POA: Insufficient documentation

## 2013-11-12 DIAGNOSIS — J45909 Unspecified asthma, uncomplicated: Secondary | ICD-10-CM | POA: Insufficient documentation

## 2013-11-12 DIAGNOSIS — Z87891 Personal history of nicotine dependence: Secondary | ICD-10-CM | POA: Insufficient documentation

## 2013-11-12 DIAGNOSIS — Z9889 Other specified postprocedural states: Secondary | ICD-10-CM | POA: Diagnosis not present

## 2013-11-12 LAB — CBC WITH DIFFERENTIAL/PLATELET
BASOS ABS: 0 10*3/uL (ref 0.0–0.1)
Basophils Relative: 0 % (ref 0–1)
EOS ABS: 0.1 10*3/uL (ref 0.0–0.7)
Eosinophils Relative: 1 % (ref 0–5)
HCT: 43 % (ref 36.0–46.0)
Hemoglobin: 14.1 g/dL (ref 12.0–15.0)
LYMPHS ABS: 3.2 10*3/uL (ref 0.7–4.0)
Lymphocytes Relative: 27 % (ref 12–46)
MCH: 28.6 pg (ref 26.0–34.0)
MCHC: 32.8 g/dL (ref 30.0–36.0)
MCV: 87.2 fL (ref 78.0–100.0)
Monocytes Absolute: 0.6 10*3/uL (ref 0.1–1.0)
Monocytes Relative: 5 % (ref 3–12)
NEUTROS PCT: 67 % (ref 43–77)
Neutro Abs: 7.9 10*3/uL — ABNORMAL HIGH (ref 1.7–7.7)
Platelets: 264 10*3/uL (ref 150–400)
RBC: 4.93 MIL/uL (ref 3.87–5.11)
RDW: 14.1 % (ref 11.5–15.5)
WBC: 11.7 10*3/uL — AB (ref 4.0–10.5)

## 2013-11-12 LAB — COMPREHENSIVE METABOLIC PANEL
ALBUMIN: 4.3 g/dL (ref 3.5–5.2)
ALK PHOS: 71 U/L (ref 39–117)
ALT: 22 U/L (ref 0–35)
AST: 19 U/L (ref 0–37)
Anion gap: 16 — ABNORMAL HIGH (ref 5–15)
BUN: 17 mg/dL (ref 6–23)
CALCIUM: 11.5 mg/dL — AB (ref 8.4–10.5)
CO2: 22 mEq/L (ref 19–32)
Chloride: 107 mEq/L (ref 96–112)
Creatinine, Ser: 0.8 mg/dL (ref 0.50–1.10)
GFR calc non Af Amer: 81 mL/min — ABNORMAL LOW (ref >90)
GLUCOSE: 107 mg/dL — AB (ref 70–99)
POTASSIUM: 4.1 meq/L (ref 3.7–5.3)
SODIUM: 145 meq/L (ref 137–147)
TOTAL PROTEIN: 7.4 g/dL (ref 6.0–8.3)
Total Bilirubin: 0.4 mg/dL (ref 0.3–1.2)

## 2013-11-12 LAB — URINALYSIS, ROUTINE W REFLEX MICROSCOPIC
Bilirubin Urine: NEGATIVE
GLUCOSE, UA: NEGATIVE mg/dL
HGB URINE DIPSTICK: NEGATIVE
Ketones, ur: NEGATIVE mg/dL
Leukocytes, UA: NEGATIVE
Nitrite: NEGATIVE
PH: 7.5 (ref 5.0–8.0)
Protein, ur: NEGATIVE mg/dL
SPECIFIC GRAVITY, URINE: 1.011 (ref 1.005–1.030)
Urobilinogen, UA: 0.2 mg/dL (ref 0.0–1.0)

## 2013-11-12 LAB — LIPASE, BLOOD: Lipase: 42 U/L (ref 11–59)

## 2013-11-12 MED ORDER — LORAZEPAM 2 MG/ML IJ SOLN: 1.0000 mg | Freq: Once | INTRAMUSCULAR | Status: AC

## 2013-11-12 MED ORDER — ONDANSETRON 8 MG PO TBDP: ORAL_TABLET | ORAL | Status: DC

## 2013-11-12 MED ORDER — LORAZEPAM 2 MG/ML IJ SOLN
INTRAMUSCULAR | Status: AC
  Filled 2013-11-12: qty 1

## 2013-11-12 MED ORDER — SODIUM CHLORIDE 0.9 % IV BOLUS (SEPSIS)
1000.0000 mL | Freq: Once | INTRAVENOUS | Status: AC
  Administered 2013-11-12: 1000 mL via INTRAVENOUS

## 2013-11-12 MED ORDER — ONDANSETRON HCL 4 MG/2ML IJ SOLN
4.0000 mg | Freq: Once | INTRAMUSCULAR | Status: AC
  Administered 2013-11-12: 4 mg via INTRAVENOUS
  Filled 2013-11-12: qty 2

## 2013-11-12 MED ORDER — MORPHINE SULFATE 4 MG/ML IJ SOLN
4.0000 mg | Freq: Once | INTRAMUSCULAR | Status: AC
  Administered 2013-11-12: 4 mg via INTRAVENOUS
  Filled 2013-11-12: qty 1

## 2013-11-12 MED ADMIN — Lorazepam Inj 2 MG/ML: INTRAVENOUS | @ 15:00:00

## 2013-11-12 NOTE — ED Provider Notes (Signed)
CSN: [id number]     Arrival date & time 11/12/13  1243 History   First MD Initiated Contact with Patient 11/12/13 1253     Chief Complaint  Patient presents with  . Abdominal Pain     (Consider location/radiation/quality/duration/timing/severity/associated sxs/prior Treatment) HPI Comments: Patient presents with abdominal pain. She states she's had some worsening abdominal pain for the last 3 days. She describes pain across her upper abdomen. She's had some associated nausea and dry heaves. She denies a fevers or chills. She has had some loose stools but no blood in her stool. She denies any urinary symptoms. She denies any past history of abdominal problems or surgeries. She does have a history of kidney stones.  Patient is a 58 y.o. female presenting with abdominal pain.  Abdominal Pain Associated symptoms: nausea   Associated symptoms: no chest pain, no chills, no cough, no diarrhea, no fatigue, no fever, no hematuria, no shortness of breath and no vomiting     Past Medical History  Diagnosis Date  . Schizophrenia     treated by Dr. Casimiro Needle  . Hypertension   . High cholesterol   . Heart murmur   . Asthma   . Pneumonia ~ 2009  . History of bronchitis     "last time ~ 2009, before I had pneumonia"  . Hypoglycemia   . Migraine     "used to have them often"  . Seizures     last seizure 3 years ago, not on AED  . Seizure 07/07/11  . Stroke 07/08/11    "I've had mini strokes before; left side of face  is more down than right "  . Chronic high back pain   . Kidney stone   . Anxiety   . PTSD (post-traumatic stress disorder)    Past Surgical History  Procedure Laterality Date  . Cochlear implant  2010    left  . Tonsillectomy      "as a child"  . Inguinal hernia repair  1985    left  . Cardiac catheterization      "not sure; I think I did"  . Kidney stone surgery  ~ 2006   Family History  Problem Relation Age of Onset  . Heart attack Father 46   History  Substance  Use Topics  . Smoking status: Former Smoker -- 1.00 packs/day for 20 years    Types: Cigarettes    Quit date: 02/29/2004  . Smokeless tobacco: Never Used  . Alcohol Use: Yes     Comment: 07/08/11 "glass of wine q once in awhile; not very often; do it on special occasion"   OB History   Grav Para Term Preterm Abortions TAB SAB Ect Mult Living                 Review of Systems  Constitutional: Negative for fever, chills, diaphoresis and fatigue.  HENT: Negative for congestion, rhinorrhea and sneezing.   Eyes: Negative.   Respiratory: Negative for cough, chest tightness and shortness of breath.   Cardiovascular: Negative for chest pain and leg swelling.  Gastrointestinal: Positive for nausea and abdominal pain. Negative for vomiting, diarrhea and blood in stool.  Genitourinary: Negative for frequency, hematuria, flank pain and difficulty urinating.  Musculoskeletal: Negative for arthralgias and back pain.  Skin: Negative for rash.  Neurological: Negative for dizziness, speech difficulty, weakness, numbness and headaches.      Allergies  Sulfa antibiotics; Benadryl; and Codeine  Home Medications   Prior to Admission medications  Medication Sig Start Date End Date Taking? Authorizing Provider  candesartan-hydrochlorothiazide (ATACAND HCT) 16-12.5 MG per tablet Take 0.5 tablets by mouth daily.    Historical Provider, MD  ciprofloxacin (CIPRO) 500 MG tablet Take 500 mg by mouth 2 (two) times daily.    Historical Provider, MD  haloperidol (HALDOL) 1 MG tablet Take 1 mg by mouth at bedtime.     Historical Provider, MD  HYDROcodone-acetaminophen (NORCO/VICODIN) 5-325 MG per tablet Take 1 tablet by mouth every 4 (four) hours as needed for pain. 11/24/11   Julianne Rice, MD  ibuprofen (ADVIL,MOTRIN) 200 MG tablet Take 600 mg by mouth every 6 (six) hours as needed. For pain    Historical Provider, MD  LORazepam (ATIVAN) 1 MG tablet Take 1 mg by mouth every 8 (eight) hours as needed. For  anxiety    Historical Provider, MD  metaxalone (SKELAXIN) 800 MG tablet Take 400-800 mg by mouth 3 (three) times daily as needed. For pain    Historical Provider, MD  metoprolol tartrate (LOPRESSOR) 25 MG tablet Take 25 mg by mouth 2 (two) times daily.    Historical Provider, MD  ondansetron (ZOFRAN ODT) 8 MG disintegrating tablet Take 1 tablet (8 mg total) by mouth every 8 (eight) hours as needed for nausea or vomiting. 07/19/13   Karen Chafe Molpus, MD  ondansetron (ZOFRAN ODT) 8 MG disintegrating tablet 8mg  ODT q4 hours prn nausea 11/12/13   Malvin Johns, MD  ondansetron (ZOFRAN) 4 MG tablet Take 1 tablet (4 mg total) by mouth every 6 (six) hours. 11/24/11   Julianne Rice, MD  temazepam (RESTORIL) 15 MG capsule Take 30 mg by mouth at bedtime as needed.    Historical Provider, MD  zolpidem (AMBIEN) 10 MG tablet Take 20 mg by mouth at bedtime as needed.    Historical Provider, MD   BP 151/82  Pulse 66  Temp(Src) 97.6 F (36.4 C) (Oral)  Resp 22  Ht 5' 2.5" (1.588 m)  Wt 184 lb (83.462 kg)  BMI 33.10 kg/m2  SpO2 99% Physical Exam  Constitutional: She is oriented to person, place, and time. She appears well-developed and well-nourished.  HENT:  Head: Normocephalic and atraumatic.  Eyes: Pupils are equal, round, and reactive to light.  Neck: Normal range of motion. Neck supple.  Cardiovascular: Normal rate, regular rhythm and normal heart sounds.   Pulmonary/Chest: Effort normal and breath sounds normal. No respiratory distress. She has no wheezes. She has no rales. She exhibits no tenderness.  Abdominal: Soft. Bowel sounds are normal. There is tenderness (moderate tenderness to left midabdomen and left flank. There some mild tenderness across the upper abdomen). There is no rebound and no guarding.  Musculoskeletal: Normal range of motion. She exhibits no edema.  Lymphadenopathy:    She has no cervical adenopathy.  Neurological: She is alert and oriented to person, place, and time.  Skin:  Skin is warm and dry. No rash noted.  Psychiatric: She has a normal mood and affect.    ED Course  Procedures (including critical care time) Labs Review Labs Reviewed  CBC WITH DIFFERENTIAL - Abnormal; Notable for the following:    WBC 11.7 (*)    Neutro Abs 7.9 (*)    All other components within normal limits  COMPREHENSIVE METABOLIC PANEL - Abnormal; Notable for the following:    Glucose, Bld 107 (*)    Calcium 11.5 (*)    GFR calc non Af Amer 81 (*)    Anion gap 16 (*)  All other components within normal limits  URINALYSIS, ROUTINE W REFLEX MICROSCOPIC - Abnormal; Notable for the following:    APPearance CLOUDY (*)    All other components within normal limits  LIPASE, BLOOD    Imaging Review Ct Renal Stone Study  11/12/2013   CLINICAL DATA:  Abdominal pain and diarrhea and nausea for 4 days; also back pain  EXAM: CT ABDOMEN AND PELVIS WITHOUT CONTRAST  TECHNIQUE: Multidetector CT imaging of the abdomen and pelvis was performed following the standard protocol without IV contrast.  COMPARISON:  Noncontrast CT scan of the abdomen and pelvis of May 07, 2013  FINDINGS: The kidneys are normal in density and contour. There is no hydronephrosis. There is stable faint calcification in the lower pole of the left kidney. Along the course of the ureters no stones are evident. The partially distended urinary bladder is normal. There are numerous phleboliths present.  The liver, gallbladder, pancreas, spleen, adrenal glands, and abdominal aorta are normal. There is a small hiatal hernia. The stomach is nondistended. The small and large bowel exhibit no evidence of ileus nor obstruction. The appendix is normal. There are few sigmoid diverticula without evidence of acute diverticulitis. The uterus and adnexal structures are within the limits of normal. There is no free pelvic fluid. There is no inguinal nor significant umbilical hernia.  The lung bases are clear. The psoas musculature is normal. The  lumbar spine and bony pelvis exhibit no acute abnormalities.  IMPRESSION: 1. There is no evidence of urinary tract obstruction or calcified urinary tract stones no perinephric inflammatory changes are demonstrated. 2. There is no acute hepatobiliary abnormality. 3. There is no evidence of bowel obstruction or ileus or acute inflammation. 4. If the patient's symptoms persist and remain unexplained, follow-up CT scanning with IV and oral contrast may be useful.   Electronically Signed   By: David  Martinique   On: 11/12/2013 14:09     EKG Interpretation   Date/Time:  Tuesday November 12 2013 13:06:47 EDT Ventricular Rate:  68 PR Interval:  132 QRS Duration: 82 QT Interval:  394 QTC Calculation: 418 R Axis:   21 Text Interpretation:  Normal sinus rhythm Possible Anterior infarct , age  undetermined Abnormal ECG Confirmed by Randall Colden  MD, Lisa-Marie Rueger ([id number]) on  11/12/2013 2:49:02 PM      MDM   Final diagnoses:  Generalized abdominal pain  Hypercalcemia    Patient's CT scan is unremarkable for acute abnormality. Her labs are unremarkable other than a slightly elevated white count and a slightly elevated calcium level. I did note that her calcium level has been elevated in the past that slightly more elevated today. She's had no actual vomiting in the ED but she continues to have some gagging. She does feel that her anxiety is a little bit more intense than it normally is and the way that she's gagging seems to be partly anxiety induced. She was given Zofran as well as a dose of Ativan and seems to have improved. We will do a by mouth challenge and if she passes this she'll be discharged home. I advised her to followup with her primary care physician within the next few days for recheck. She also has an appointment with her therapist tomorrow.    Malvin Johns, MD 11/12/13 [phone]

## 2013-11-12 NOTE — ED Notes (Signed)
Patient transported to CT 

## 2013-11-12 NOTE — Discharge Instructions (Signed)
Abdominal Pain Many things can cause abdominal pain. Usually, abdominal pain is not caused by a disease and will improve without treatment. It can often be observed and treated at home. Your health care provider will do a physical exam and possibly order blood tests and X-rays to help determine the seriousness of your pain. However, in many cases, more time must pass before a clear cause of the pain can be found. Before that point, your health care provider may not know if you need more testing or further treatment. HOME CARE INSTRUCTIONS  Monitor your abdominal pain for any changes. The following actions may help to alleviate any discomfort you are experiencing:  Only take over-the-counter or prescription medicines as directed by your health care provider.  Do not take laxatives unless directed to do so by your health care provider.  Try a clear liquid diet (broth, tea, or water) as directed by your health care provider. Slowly move to a bland diet as tolerated. SEEK MEDICAL CARE IF:  You have unexplained abdominal pain.  You have abdominal pain associated with nausea or diarrhea.  You have pain when you urinate or have a bowel movement.  You experience abdominal pain that wakes you in the night.  You have abdominal pain that is worsened or improved by eating food.  You have abdominal pain that is worsened with eating fatty foods.  You have a fever. SEEK IMMEDIATE MEDICAL CARE IF:   Your pain does not go away within 2 hours.  You keep throwing up (vomiting).  Your pain is felt only in portions of the abdomen, such as the right side or the left lower portion of the abdomen.  You pass bloody or black tarry stools. MAKE SURE YOU:  Understand these instructions.   Will watch your condition.   Will get help right away if you are not doing well or get worse.  Document Released: 11/24/2004 Document Revised: 02/19/2013 Document Reviewed: 10/24/2012 [facility] Patient Information  2015 [place], [place]. This information is not intended to replace advice given to you by your health care provider. Make sure you discuss any questions you have with your health care provider.  Hypercalcemia Hypercalcemia means the calcium in your blood is too high. Calcium in our blood is important for the control of many things, such as:  Blood clotting.  Conducting of nerve impulses.  Muscle contraction.  Maintaining teeth and bone health.  Other body functions. In the bloodstream, calcium maintains a constant balance with another mineral, phosphate. Calcium is absorbed into the body through the small intestine. This is helped by vitamin D. Calcium levels are maintained mostly by vitamin D and a hormone (parathyroid hormone). But the kidneys also help. Hypercalcemia can happen when the concentration of calcium is too high for the kidneys to maintain balance. The body maintains a balance between the calcium we eat and the calcium already in our body. If calcium intake is increased or we cannot use calcium properly, there may be problems. Some common sources of calcium are:   Dairy products.  Nuts.  Eggs.  Whole grains.  Legumes.  Green leafy vegetables. CAUSES There are many causes of this condition, but some common ones are:  Hyperparathyroidism. This is an overactivity of the parathyroid gland.  Cancers of the breast, kidney, lung, head, and neck are common causes of calcium increases.  Medications that cause you to urinate more often (diuretics), nausea, vomiting, and diarrhea also increase the calcium in the blood.  Overuse of calcium-containing antacids.  SYMPTOMS  Many patients with mild hypercalcemia have no symptoms. For those with symptoms, common problems include:  Loss of appetite.  Constipation.  Increased thirst.  Heart rhythm changes.  Abnormal thinking.  Nausea.  Abdominal pain.  Kidney stones.  Mood swings.  Coma and death when  severe.  Vomiting.  Increased urination.  High blood pressure.  Confusion. DIAGNOSIS   Your caregiver will do a medical history and perform a physical exam on you.  Calcium and parathyroid hormone (PTH) may be measured with a blood test. TREATMENT   The treatment depends on the calcium level and what is causing the higher level. Hypercalcemia can be life threatening. Fast lowering of the calcium level may be necessary.  With normal kidney function, fluids can be given by vein to clear the excess calcium. Hemodialysis works well to reduce dangerous calcium levels if there is poor kidney function. This is a procedure in which a machine is used to filter out unwanted substances. The blood is then returned to the body.  Drugs, such as diuretics, can be given after adequate fluid intake is established. These medications help the kidneys get rid of extra calcium. Drugs that lessen (inhibit) bone loss are helpful in gaining long-term control. Phosphate pills help lower high calcium levels caused by a low supply of phosphate. Anti-inflammatory agents such as steroids are helpful with some cancers and toxic levels of vitamin D.  Treatment of the underlying cause of the hypercalcemia will also correct the imbalance. Hyperparathyroidism is usually treated by surgical removal of one or more of the parathyroid glands and any tissue, other than the glands themselves, that is producing too much hormone.  The hypercalcemia caused by cancer is difficult to treat without controlling the cancer. Symptoms can be improved with fluids and drug therapy as outlined above. PROGNOSIS   Surgery to remove the parathyroid glands is usually successful. This also depends on the amount of damage to the kidneys and whether or not it can be treated.  Mild hypercalcemia can be controlled with good fluid intake and the use of effective medications.  Hypercalcemia often develops as a late complication of cancer. The  expected outlook is poor without effective anticancer therapy. PREVENTION   If you are at risk for developing hypercalcemia, be familiar with early symptoms. Report these to your caregiver.  Good fluid intake (up to four quarts of liquid a day if possible) is helpful.  Try to control nausea and vomiting, and treat fevers to avoid dehydration.  Lowering the amount of calcium in your diet is not necessary. High blood calcium reduces absorption of calcium in the intestine.  Stay as active as possible. SEEK IMMEDIATE MEDICAL CARE IF:   You develop chest pain, sweating, or shortness of breath.  You get confused, feel faint or pass out.  You develop severe nausea and vomiting. MAKE SURE YOU:   Understand these instructions.  Will watch your condition.  Will get help right away if you are not doing well or get worse. Document Released: 04/30/2004 Document Revised: 07/01/2013 Document Reviewed: 02/09/2010 [facility] Patient Information 2015 [place], [place]. This information is not intended to replace advice given to you by your health care provider. Make sure you discuss any questions you have with your health care provider.

## 2013-11-12 NOTE — ED Notes (Signed)
Pt is more calm. Able to give better hx. States she was having abdominal pain and diarrhea x 4 today. Nausea and dry heaving. Was on her way to see her MD for same and pain got worse so she came here.

## 2013-11-12 NOTE — ED Notes (Signed)
Brought to tx room from registration. Pt is hyperventilating. Unable to speak. Husband states she called him and told him she was having trouble breathing. Heaving no vomiting.

## 2013-12-23 DIAGNOSIS — M810 Age-related osteoporosis without current pathological fracture: Secondary | ICD-10-CM | POA: Insufficient documentation

## 2014-03-17 DIAGNOSIS — N200 Calculus of kidney: Secondary | ICD-10-CM

## 2014-03-17 HISTORY — DX: Calculus of kidney: N20.0

## 2014-06-10 ENCOUNTER — Other Ambulatory Visit: Payer: Self-pay | Admitting: Nurse Practitioner

## 2014-06-10 ENCOUNTER — Other Ambulatory Visit (HOSPITAL_COMMUNITY)
Admission: RE | Admit: 2014-06-10 | Discharge: 2014-06-10 | Disposition: A | Discharge status: Home / Self Care | Payer: 59 | Source: Ambulatory Visit | Attending: Nurse Practitioner | Admitting: Nurse Practitioner

## 2014-06-10 DIAGNOSIS — Z1151 Encounter for screening for human papillomavirus (HPV): Secondary | ICD-10-CM | POA: Diagnosis present

## 2014-06-10 DIAGNOSIS — Z01419 Encounter for gynecological examination (general) (routine) without abnormal findings: Secondary | ICD-10-CM | POA: Insufficient documentation

## 2014-06-12 LAB — CYTOLOGY - PAP

## 2014-07-15 DIAGNOSIS — M5416 Radiculopathy, lumbar region: Secondary | ICD-10-CM | POA: Insufficient documentation

## 2014-07-18 ENCOUNTER — Other Ambulatory Visit: Payer: Self-pay | Admitting: Neurosurgery

## 2014-07-18 DIAGNOSIS — M5416 Radiculopathy, lumbar region: Secondary | ICD-10-CM

## 2014-08-02 ENCOUNTER — Ambulatory Visit (HOSPITAL_BASED_OUTPATIENT_CLINIC_OR_DEPARTMENT_OTHER): Payer: 59

## 2014-08-06 ENCOUNTER — Other Ambulatory Visit: Payer: 59

## 2014-08-11 DIAGNOSIS — M5136 Other intervertebral disc degeneration, lumbar region: Secondary | ICD-10-CM | POA: Insufficient documentation

## 2014-09-22 DIAGNOSIS — R001 Bradycardia, unspecified: Secondary | ICD-10-CM | POA: Insufficient documentation

## 2014-09-22 HISTORY — DX: Bradycardia, unspecified: R00.1

## 2014-09-25 ENCOUNTER — Ambulatory Visit: Payer: 59 | Admitting: Psychology

## 2014-12-05 ENCOUNTER — Encounter: Payer: Self-pay | Admitting: Physician Assistant

## 2014-12-05 ENCOUNTER — Ambulatory Visit (HOSPITAL_BASED_OUTPATIENT_CLINIC_OR_DEPARTMENT_OTHER): Payer: 59

## 2014-12-05 ENCOUNTER — Ambulatory Visit (INDEPENDENT_AMBULATORY_CARE_PROVIDER_SITE_OTHER): Payer: 59 | Admitting: Physician Assistant

## 2014-12-05 ENCOUNTER — Ambulatory Visit (HOSPITAL_BASED_OUTPATIENT_CLINIC_OR_DEPARTMENT_OTHER)
Admission: RE | Admit: 2014-12-05 | Discharge: 2014-12-05 | Disposition: A | Discharge status: Home / Self Care | Payer: 59 | Source: Ambulatory Visit | Attending: Physician Assistant | Admitting: Physician Assistant

## 2014-12-05 VITALS — BP 118/58 | HR 49 | Temp 98.1°F | Resp 16 | Ht 63.25 in | Wt 182.5 lb

## 2014-12-05 DIAGNOSIS — R110 Nausea: Secondary | ICD-10-CM | POA: Diagnosis not present

## 2014-12-05 DIAGNOSIS — H532 Diplopia: Secondary | ICD-10-CM

## 2014-12-05 LAB — COMPREHENSIVE METABOLIC PANEL
ALBUMIN: 3.8 g/dL (ref 3.5–5.2)
ALT: 32 U/L (ref 0–35)
AST: 18 U/L (ref 0–37)
Alkaline Phosphatase: 59 U/L (ref 39–117)
BUN: 10 mg/dL (ref 6–23)
CO2: 29 mEq/L (ref 19–32)
Calcium: 9.8 mg/dL (ref 8.4–10.5)
Chloride: 110 mEq/L (ref 96–112)
Creatinine, Ser: 0.67 mg/dL (ref 0.40–1.20)
GFR: 96.19 mL/min (ref >60.00)
Glucose, Bld: 121 mg/dL — ABNORMAL HIGH (ref 70–99)
Potassium: 4.2 mEq/L (ref 3.5–5.1)
Sodium: 142 mEq/L (ref 135–145)
Total Bilirubin: 0.3 mg/dL (ref 0.2–1.2)
Total Protein: 6.2 g/dL (ref 6.0–8.3)

## 2014-12-05 LAB — CBC
HCT: 40.2 % (ref 36.0–46.0)
Hemoglobin: 13.3 g/dL (ref 12.0–15.0)
MCHC: 33.1 g/dL (ref 30.0–36.0)
MCV: 86.9 fl (ref 78.0–100.0)
PLATELETS: 225 10*3/uL (ref 150.0–400.0)
RBC: 4.63 Mil/uL (ref 3.87–5.11)
RDW: 14.6 % (ref 11.5–15.5)
WBC: 8.6 10*3/uL (ref 4.0–10.5)

## 2014-12-05 LAB — TSH: TSH: 1.59 u[IU]/mL (ref 0.35–4.50)

## 2014-12-05 MED ORDER — ONDANSETRON HCL 4 MG/2ML IJ SOLN
4.0000 mg | Freq: Once | INTRAMUSCULAR | Status: AC
  Administered 2014-12-05: 4 mg via INTRAMUSCULAR

## 2014-12-05 NOTE — Progress Notes (Signed)
Patient presents to clinic today to establish care with our practice. Patient c/o 3 days of diplopia with lateral movement of her left eye. Denies trauma or injury. Denies sinus pressure, pain. Denies photophobia or eye pain. Endorses some headache due to double vision. Went to an eye doctor's office but was unable to see provider there. Patient with history of TIA affecting right side of face. Denies facial drooping, numbness or tingling. Denies AMS or weakness of extremities.   Past Medical History  Diagnosis Date  . Schizophrenia ([place])     treated by Dr. Casimiro Needle  . Hypertension   . High cholesterol   . Heart murmur   . Asthma   . Pneumonia ~ 2009  . History of bronchitis     "last time ~ 2009, before I had pneumonia"  . Hypoglycemia   . Migraine     "used to have them often"  . Seizures ([place])     last seizure 3 years ago, not on AED  . Seizure ([place]) 07/07/11  . Stroke ([place]) 07/08/11    "I've had mini strokes before; left side of face  is more down than right "  . Chronic high back pain   . Kidney stone   . Anxiety   . PTSD (post-traumatic stress disorder)   . Double vision   . Hyperparathyroidism ([place])   . Meniscus tear     Right    Past Surgical History  Procedure Laterality Date  . Cochlear implant  2010    left  . Tonsillectomy      "as a child"  . Inguinal hernia repair  1985    left  . Kidney stone surgery  ~ 2006  . Wisdom tooth extraction      Current Outpatient Prescriptions on File Prior to Visit  Medication Sig Dispense Refill  . LORazepam (ATIVAN) 1 MG tablet Take 2 tablets by mouth twice a day For anxiety    . metoprolol tartrate (LOPRESSOR) 25 MG tablet Take 25 mg by mouth daily.     . temazepam (RESTORIL) 15 MG capsule Take 30 mg by mouth at bedtime as needed.    . zolpidem (AMBIEN) 10 MG tablet Take 20 mg by mouth at bedtime as needed.     No current facility-administered medications on file prior to visit.    Allergies  Allergen Reactions    . Sulfa Antibiotics Hives  . Metronidazole Nausea And Vomiting  . Benadryl [Diphenhydramine Hcl]     Severe emotional reaction and doesn't work well with Pt. Past history  . Cephalosporins Hives  . Codeine Other (See Comments)    Makes patient feel odd ; "sometimes mild; sometimes severe reaction; mostly severe"  . Diclofenac Sodium Nausea And Vomiting  . Nitrofurantoin Monohyd Macro     Felt funny  . Prednisone Other (See Comments)    migraine  . Tizanidine     Other reaction(s): Other (See Comments) insomnia  . Baclofen Nausea Only  . Meloxicam     Other reaction(s): Confusion  . Sulfamethoxazole Rash    Family History  Problem Relation Age of Onset  . Heart attack Father 7    Living  . Leukemia Mother 36    Deceased  . Arthritis Father   . Skin cancer Father   . Hypertension Father   . Hyperlipidemia Father   . Alzheimer's disease Paternal Aunt     X2  . Stomach cancer Paternal Aunt     x1  .  Arthritis/Rheumatoid Paternal Aunt     x1  . Neuropathy Brother     Peripheal  . Fibromyalgia Sister   . HIV Sister   . Arthritis/Rheumatoid Sister   . Diabetes Paternal Grandmother     Social History   Social History  . Marital Status: Legally Separated    Spouse Name: N/A  . Number of Children: N/A  . Years of Education: N/A   Occupational History  . Not on file.   Social History Main Topics  . Smoking status: Former Smoker -- 1.00 packs/day for 20 years    Types: Cigarettes    Quit date: 02/29/2004  . Smokeless tobacco: Never Used  . Alcohol Use: Yes     Comment: 07/08/11 "glass of wine q once in awhile; not very often; do it on special occasion"  . Drug Use: No  . Sexual Activity: No   Other Topics Concern  . Not on file   Social History Narrative   Completed 2 years of college.  Patient reports living in an apartment by herself, and able to perform all ADL's by herself.  Patient legally separated, 81 y/o son who lives with his father and visits the  patient on weekends.  On disability.   Review of Systems  Constitutional: Negative for fever.  Eyes: Positive for double vision. Negative for blurred vision, photophobia, pain, discharge and redness.  Cardiovascular: Negative for chest pain and palpitations.  Neurological: Positive for headaches. Negative for dizziness, tingling, focal weakness and loss of consciousness.  Psychiatric/Behavioral: Negative for depression.    BP 118/58 mmHg  Pulse 49  Temp(Src) 98.1 F (36.7 C) (Oral)  Resp 16  Ht 5' 3.25" (1.607 m)  Wt 182 lb 8 oz (82.781 kg)  BMI 32.06 kg/m2  SpO2 99%  Physical Exam  Constitutional: She is oriented to person, place, and time and well-developed, well-nourished, and in no distress.  HENT:  Head: Normocephalic and atraumatic.  Eyes: Conjunctivae are normal. Pupils are equal, round, and reactive to light.  strabismus noted of left eye with moving laterally.   Neck: Neck supple.  Cardiovascular: Normal rate, regular rhythm, normal heart sounds and intact distal pulses.   Neurological: She is alert and oriented to person, place, and time. No cranial nerve deficit.  Skin: Skin is warm and dry. No rash noted.  Psychiatric: Affect normal.  Vitals reviewed.  Recent Results (from the past 2160 hour(s))  CBC     Status: None   Collection Time: 12/05/14  8:54 AM  Result Value Ref Range   WBC 8.6 4.0 - 10.5 K/uL   RBC 4.63 3.87 - 5.11 Mil/uL   Platelets 225.0 150.0 - 400.0 K/uL   Hemoglobin 13.3 12.0 - 15.0 g/dL   HCT 40.2 36.0 - 46.0 %   MCV 86.9 78.0 - 100.0 fl   MCHC 33.1 30.0 - 36.0 g/dL   RDW 14.6 11.5 - 15.5 %  TSH     Status: None   Collection Time: 12/05/14  8:54 AM  Result Value Ref Range   TSH 1.59 0.35 - 4.50 uIU/mL  Comp Met (CMET)     Status: Abnormal   Collection Time: 12/05/14  8:54 AM  Result Value Ref Range   Sodium 142 135 - 145 mEq/L   Potassium 4.2 3.5 - 5.1 mEq/L   Chloride 110 96 - 112 mEq/L   CO2 29 19 - 32 mEq/L   Glucose, Bld 121 (H)  70 - 99 mg/dL   BUN 10 6 -  23 mg/dL   Creatinine, Ser 0.67 0.40 - 1.20 mg/dL   Total Bilirubin 0.3 0.2 - 1.2 mg/dL   Alkaline Phosphatase 59 39 - 117 U/L   AST 18 0 - 37 U/L   ALT 32 0 - 35 U/L   Total Protein 6.2 6.0 - 8.3 g/dL   Albumin 3.8 3.5 - 5.2 g/dL   Calcium 9.8 8.4 - 10.5 mg/dL   GFR 96.19 >60.00 mL/min  INR/PT     Status: Abnormal   Collection Time: 12/09/14  7:42 AM  Result Value Ref Range   INR 1.1 (H) 0.8 - 1.0 ratio   Prothrombin Time 12.3 9.6 - 13.1 sec  Urinalysis, Routine w reflex microscopic (not at [facility])     Status: None   Collection Time: 12/09/14  7:42 AM  Result Value Ref Range   Color, Urine YELLOW Yellow;Lt. Yellow   APPearance CLEAR Clear   Specific Gravity, Urine 1.015 1.000-1.030   pH 6.5 5.0 - 8.0   Total Protein, Urine NEGATIVE Negative   Urine Glucose NEGATIVE Negative   Ketones, ur NEGATIVE Negative   Bilirubin Urine NEGATIVE Negative   Hgb urine dipstick NEGATIVE Negative   Urobilinogen, UA 0.2 0.0 - 1.0   Leukocytes, UA NEGATIVE Negative   Nitrite NEGATIVE Negative   WBC, UA none seen 0-2/hpf   RBC / HPF none seen 0-2/hpf   Squamous Epithelial / LPF Rare(0-4/hpf) Rare(0-4/hpf)    Assessment/Plan: Diplopia Will obtain lab panel today and STAT CT. Appointment scheduled with Ophthalmology today ([place]) for dilated eye examination and further assessment.

## 2014-12-05 NOTE — Progress Notes (Signed)
Pre visit review using our clinic review tool, if applicable. No additional management support is needed unless otherwise documented below in the visit note/SLS  

## 2014-12-08 ENCOUNTER — Encounter: Payer: Self-pay | Admitting: Family

## 2014-12-08 ENCOUNTER — Ambulatory Visit: Payer: 59 | Admitting: Family

## 2014-12-08 ENCOUNTER — Ambulatory Visit (INDEPENDENT_AMBULATORY_CARE_PROVIDER_SITE_OTHER): Payer: 59 | Admitting: Family

## 2014-12-08 VITALS — BP 145/57 | HR 68 | Temp 98.4°F | Resp 18 | Ht 63.25 in | Wt 183.6 lb

## 2014-12-08 DIAGNOSIS — M179 Osteoarthritis of knee, unspecified: Secondary | ICD-10-CM

## 2014-12-08 DIAGNOSIS — I10 Essential (primary) hypertension: Secondary | ICD-10-CM | POA: Insufficient documentation

## 2014-12-08 DIAGNOSIS — N189 Chronic kidney disease, unspecified: Secondary | ICD-10-CM

## 2014-12-08 DIAGNOSIS — M1710 Unilateral primary osteoarthritis, unspecified knee: Secondary | ICD-10-CM

## 2014-12-08 DIAGNOSIS — N1832 Chronic kidney disease, stage 3b: Secondary | ICD-10-CM | POA: Insufficient documentation

## 2014-12-08 DIAGNOSIS — G529 Cranial nerve disorder, unspecified: Secondary | ICD-10-CM

## 2014-12-08 DIAGNOSIS — Z01818 Encounter for other preprocedural examination: Secondary | ICD-10-CM

## 2014-12-08 HISTORY — DX: Chronic kidney disease, unspecified: N18.9

## 2014-12-08 NOTE — Patient Instructions (Signed)
Please schedule lab visit at the front desk. We will contact you in AM to schedule your head CT and chest x-ray

## 2014-12-08 NOTE — Progress Notes (Signed)
Subjective:    Patient ID: Jasmine [name], female    DOB: January 03, 1956, 59 y.o.   MRN: [id number]  HPI   Jasmine [name] is a 59 yr old female who presents today for pre-op clearance for her upcoming right TKA which will be performed by Dr. Maxie Better. Dr Bernadette Hoit office has requested surgical clearance for TKA.  This has not been scheduled yet.  Patient reports that she is also scheduled for a parathyroidectomy tomorrow with Dr. Celine Ahr at [facility] on 12/10/14.  Reports bilateral knee pain L>R.   Incidentally, she is also scheduled for a parathyroidectomy on 12/10/14 with Dr. Celine Ahr at [facility].    She report that she was Nausea, fatigue, joint pain- she attributes these symptoms to her hyperparathyroidism.     Review of Systems  Constitutional: Positive for fatigue.       Wt Readings from Last 3 Encounters: 12/08/14 : 183 lb 9.6 oz (83.28 kg) 12/05/14 : 182 lb 8 oz (82.781 kg) 11/12/13 : 184 lb (83.462 kg)    HENT: Positive for hearing loss. Negative for rhinorrhea.   Eyes:       Some diplopia, attributed to cataract- saw opthalmology (scheduled for cataract surgery).    Respiratory: Negative for cough.   Cardiovascular: Negative for chest pain.  Gastrointestinal: Positive for nausea and constipation. Negative for diarrhea.  Genitourinary: Positive for frequency. Negative for dysuria.  Musculoskeletal: Positive for arthralgias.  Skin: Negative for rash.  Neurological: Negative for headaches.  Hematological: Negative for adenopathy.  Psychiatric/Behavioral:       Denies depression.  + anxiety   Past Medical History  Diagnosis Date  . Schizophrenia ([place])     treated by Dr. Casimiro Needle  . Hypertension   . High cholesterol   . Heart murmur   . Asthma   . Pneumonia ~ 2009  . History of bronchitis     "last time ~ 2009, before I had pneumonia"  . Hypoglycemia   . Migraine     "used to have them often"  . Seizures ([place])     last seizure 3 years ago, not on AED  . Seizure ([place]) 07/07/11    . Stroke ([place]) 07/08/11    "I've had mini strokes before; left side of face  is more down than right "  . Chronic high back pain   . Kidney stone   . Anxiety   . PTSD (post-traumatic stress disorder)   . Double vision   . Hyperparathyroidism ([place])   . Meniscus tear     Right    Social History   Social History  . Marital Status: Legally Separated    Spouse Name: N/A  . Number of Children: N/A  . Years of Education: N/A   Occupational History  . Not on file.   Social History Main Topics  . Smoking status: Former Smoker -- 1.00 packs/day for 20 years    Types: Cigarettes    Quit date: 02/29/2004  . Smokeless tobacco: Never Used  . Alcohol Use: Yes     Comment: 07/08/11 "glass of wine q once in awhile; not very often; do it on special occasion"  . Drug Use: No  . Sexual Activity: No   Other Topics Concern  . Not on file   Social History Narrative   Completed 2 years of college.  Patient reports living in an apartment by herself, and able to perform all ADL's by herself.  Patient legally separated, 67 y/o son who lives with  his father and visits the patient on weekends.  On disability.    Past Surgical History  Procedure Laterality Date  . Cochlear implant  2010    left  . Tonsillectomy      "as a child"  . Inguinal hernia repair  1985    left  . Kidney stone surgery  ~ 2006  . Wisdom tooth extraction      Family History  Problem Relation Age of Onset  . Heart attack Father 69    Living  . Leukemia Mother 11    Deceased  . Arthritis Father   . Skin cancer Father   . Hypertension Father   . Hyperlipidemia Father   . Alzheimer's disease Paternal Aunt     X2  . Stomach cancer Paternal Aunt     x1  . Arthritis/Rheumatoid Paternal Aunt     x1  . Neuropathy Brother     Peripheal  . Fibromyalgia Sister   . HIV Sister   . Arthritis/Rheumatoid Sister   . Diabetes Paternal Grandmother     Allergies  Allergen Reactions  . Sulfa Antibiotics Hives  .  Metronidazole Nausea And Vomiting  . Benadryl [Diphenhydramine Hcl]     Severe emotional reaction and doesn't work well with Pt. Past history  . Cephalosporins Hives  . Codeine Other (See Comments)    Makes patient feel odd ; "sometimes mild; sometimes severe reaction; mostly severe"  . Diclofenac Sodium Nausea And Vomiting  . Nitrofurantoin Monohyd Macro     Felt funny  . Prednisone Other (See Comments)    migraine  . Tizanidine     Other reaction(s): Other (See Comments) insomnia  . Baclofen Nausea Only  . Meloxicam     Other reaction(s): Confusion  . Sulfamethoxazole Rash    Current Outpatient Prescriptions on File Prior to Visit  Medication Sig Dispense Refill  . cyanocobalamin 500 MCG tablet Take 500 mcg by mouth daily.    . fluticasone (FLONASE) 50 MCG/ACT nasal spray Place 2 sprays into both nostrils daily.    Marland Kitchen gabapentin (NEURONTIN) 100 MG capsule Take 100 mg by mouth daily.    Marland Kitchen levalbuterol (XOPENEX HFA) 45 MCG/ACT inhaler Inhale 1 puff into the lungs daily.    Marland Kitchen LORazepam (ATIVAN) 1 MG tablet Take 2 tablets by mouth twice a day For anxiety    . losartan (COZAAR) 25 MG tablet Take 25 mg by mouth 2 (two) times daily.     . magnesium 30 MG tablet Take 30 mg by mouth daily.    . metoprolol (LOPRESSOR) 50 MG tablet Take 50 mg by mouth daily.    . metoprolol tartrate (LOPRESSOR) 25 MG tablet Take 25 mg by mouth daily.     Marland Kitchen omega-3 fish oil (MAXEPA) 1000 MG CAPS capsule Take by mouth.    Marland Kitchen POTASSIUM BICARBONATE PO 395mg  each.  Pt takes 2 tablets daily.    . rosuvastatin (CRESTOR) 20 MG tablet Take 20 mg by mouth every evening.    . temazepam (RESTORIL) 15 MG capsule Take 30 mg by mouth at bedtime as needed.    . zolpidem (AMBIEN) 10 MG tablet Take 20 mg by mouth at bedtime as needed.     No current facility-administered medications on file prior to visit.    BP 145/57 mmHg  Pulse 68  Temp(Src) 98.4 F (36.9 C) (Oral)  Resp 18  Ht 5' 3.25" (1.607 m)  Wt 183 lb 9.6  oz (83.28 kg)  BMI 32.25 kg/m2  SpO2 98%        Objective:   Physical Exam  Constitutional: She is oriented to person, place, and time. She appears well-developed and well-nourished.  HENT:  Head: Normocephalic and atraumatic.  Right Ear: Tympanic membrane is not erythematous.  Left Ear: Tympanic membrane is not erythematous.  TM's appear scarred  Eyes: Pupils are equal, round, and reactive to light.  Neck: No thyromegaly present.  Cardiovascular: Normal rate, regular rhythm and normal heart sounds.   No murmur heard. Pulmonary/Chest: Effort normal and breath sounds normal. No respiratory distress. She has no wheezes.  Abdominal: Soft. She exhibits no distension. There is no tenderness. There is no rebound.  Musculoskeletal: She exhibits no edema.  Lymphadenopathy:    She has no cervical adenopathy.  Neurological: She is alert and oriented to person, place, and time. Coordination normal.  EOM Left eye are impaired Mild symmetrical UE and LE weakness is noted.   Skin: Skin is warm and dry.  Psychiatric: She has a normal mood and affect. Her behavior is normal. Judgment and thought content normal.          Assessment & Plan:  Cranial nerve deficit- abnormal EOM left eye.  CT head neg for CVA. Pt has cochlear implant.  Therefore I do not believe that she can undergo an MRI. However, I will refer her to neurology for further evaluation.  I do believe that this is a contributing factor to her diplopia.

## 2014-12-08 NOTE — Progress Notes (Signed)
Pre visit review using our clinic review tool, if applicable. No additional management support is needed unless otherwise documented below in the visit note. 

## 2014-12-09 ENCOUNTER — Ambulatory Visit (INDEPENDENT_AMBULATORY_CARE_PROVIDER_SITE_OTHER): Payer: 59

## 2014-12-09 ENCOUNTER — Telehealth: Payer: Self-pay | Admitting: Physician Assistant

## 2014-12-09 ENCOUNTER — Ambulatory Visit (HOSPITAL_BASED_OUTPATIENT_CLINIC_OR_DEPARTMENT_OTHER)
Admission: RE | Admit: 2014-12-09 | Discharge: 2014-12-09 | Disposition: A | Discharge status: Home / Self Care | Payer: 59 | Source: Ambulatory Visit | Attending: Physician Assistant | Admitting: Physician Assistant

## 2014-12-09 ENCOUNTER — Ambulatory Visit (HOSPITAL_BASED_OUTPATIENT_CLINIC_OR_DEPARTMENT_OTHER)
Admission: RE | Admit: 2014-12-09 | Discharge: 2014-12-09 | Disposition: A | Discharge status: Home / Self Care | Payer: 59 | Source: Ambulatory Visit | Attending: Family | Admitting: Family

## 2014-12-09 ENCOUNTER — Other Ambulatory Visit: Payer: 59

## 2014-12-09 ENCOUNTER — Telehealth: Payer: Self-pay | Admitting: Family

## 2014-12-09 DIAGNOSIS — M179 Osteoarthritis of knee, unspecified: Secondary | ICD-10-CM | POA: Insufficient documentation

## 2014-12-09 DIAGNOSIS — H532 Diplopia: Secondary | ICD-10-CM | POA: Insufficient documentation

## 2014-12-09 DIAGNOSIS — Z23 Encounter for immunization: Secondary | ICD-10-CM

## 2014-12-09 DIAGNOSIS — Z01818 Encounter for other preprocedural examination: Secondary | ICD-10-CM | POA: Diagnosis not present

## 2014-12-09 DIAGNOSIS — M1710 Unilateral primary osteoarthritis, unspecified knee: Secondary | ICD-10-CM | POA: Insufficient documentation

## 2014-12-09 LAB — URINALYSIS, ROUTINE W REFLEX MICROSCOPIC
Bilirubin Urine: NEGATIVE
Hgb urine dipstick: NEGATIVE
KETONES UR: NEGATIVE
Leukocytes, UA: NEGATIVE
Nitrite: NEGATIVE
RBC / HPF: NONE SEEN (ref 0–?)
SPECIFIC GRAVITY, URINE: 1.015 (ref 1.000–1.030)
TOTAL PROTEIN, URINE-UPE24: NEGATIVE
URINE GLUCOSE: NEGATIVE
Urobilinogen, UA: 0.2 (ref 0.0–1.0)
WBC UA: NONE SEEN (ref 0–?)
pH: 6.5 (ref 5.0–8.0)

## 2014-12-09 LAB — PROTIME-INR
INR: 1.1 ratio — ABNORMAL HIGH (ref 0.8–1.0)
PROTHROMBIN TIME: 12.3 s (ref 9.6–13.1)

## 2014-12-09 NOTE — Telephone Encounter (Signed)
Please let pt know that I reviewed her CT result- no stroke.  Good news.  Urine and blood work look ok.  I think it is ok for her to proceed with planned surgery tomorrow and we will fill ou the form for her knee surgery.  In regards to the double vision and the abnormal movement of her left eye, I would recommend an non-urgent referral to neurology for further evaluation.   I have pended below.

## 2014-12-09 NOTE — Assessment & Plan Note (Signed)
CXR is performed and is clear.  Pt had cmet, cbc, tsh performed on 10/7 which were unremarkable except for some mild hyperglycemia. CXR is performed and is clear. INR 1.1 which is mildly elevated but should not preclude surgery. UA looks ok, Urine culture pending. Reviewed EKG performed this week at [facility]- sinus brady- otherwise normal EKG per reading.

## 2014-12-09 NOTE — Telephone Encounter (Signed)
Please see additional phone note from 12/09/14.

## 2014-12-09 NOTE — Telephone Encounter (Signed)
Caller name:Kiannah Ruben Relationship to patient:self Can be reached:[phone] Pharmacy:  Reason for call: Returning call, not sure who called. I do not see any notes.

## 2014-12-09 NOTE — Telephone Encounter (Signed)
Left detailed message on voicemail re: below instructions and to call if any questions. 

## 2014-12-09 NOTE — Assessment & Plan Note (Signed)
Will obtain lab panel today and STAT CT. Appointment scheduled with Ophthalmology today ([place]) for dilated eye examination and further assessment.

## 2014-12-10 DIAGNOSIS — E210 Primary hyperparathyroidism: Secondary | ICD-10-CM

## 2014-12-10 HISTORY — DX: Primary hyperparathyroidism: E21.0

## 2014-12-10 LAB — URINE CULTURE

## 2014-12-11 ENCOUNTER — Telehealth: Payer: Self-pay | Admitting: Physician Assistant

## 2014-12-11 NOTE — Telephone Encounter (Signed)
Increase water intake to thin mucous. Can use plain Mucinex (over-the-counter) as well.

## 2014-12-11 NOTE — Telephone Encounter (Signed)
Called pt and informed the below, pt understood, also mentioned if she has any issues breathing to go to ER.

## 2014-12-11 NOTE — Telephone Encounter (Signed)
Caller name: Miah Relation to pt: self  Call back number: [phone] Pharmacy: [facility]  Reason for call: Pt states had surgery yestersday from her tyroid and mentioned is having issues with throat, she is having a lot of mucus and is wanting to know what can help get rid of mucus, anything that she can take over the counter. Please advise.

## 2014-12-22 ENCOUNTER — Telehealth: Payer: Self-pay | Admitting: Physician Assistant

## 2014-12-22 DIAGNOSIS — H903 Sensorineural hearing loss, bilateral: Secondary | ICD-10-CM

## 2014-12-22 HISTORY — DX: Sensorineural hearing loss, bilateral: H90.3

## 2014-12-22 MED ORDER — LEVALBUTEROL TARTRATE 45 MCG/ACT IN AERO
1.0000 | INHALATION_SPRAY | Freq: Every day | RESPIRATORY_TRACT | Status: DC
Start: 2014-12-22 — End: 2014-12-23

## 2014-12-22 MED ORDER — FLUTICASONE-SALMETEROL 115-21 MCG/ACT IN AERO: 1.0000 | INHALATION_SPRAY | Freq: Two times a day (BID) | RESPIRATORY_TRACT | Status: DC

## 2014-12-22 MED ORDER — ROSUVASTATIN CALCIUM 20 MG PO TABS: 20.0000 mg | ORAL_TABLET | Freq: Every evening | ORAL | Status: DC

## 2014-12-22 MED ORDER — LOSARTAN POTASSIUM 25 MG PO TABS: 25.0000 mg | ORAL_TABLET | Freq: Two times a day (BID) | ORAL | Status: DC

## 2014-12-22 NOTE — Telephone Encounter (Signed)
error:[id number] ° °

## 2014-12-22 NOTE — Telephone Encounter (Signed)
Refills sent in

## 2014-12-22 NOTE — Telephone Encounter (Signed)
Relation to DX:IPJA Call back number:[phone] Pharmacy: [facility], [place] [phone] (Phone) [phone] (Fax)         Reason for call:  patient requesting a refill  losartan (COZAAR) 25 MG tablet  rosuvastatin (CRESTOR) 20 MG tablet  fluticasone-salmeterol (ADVAIR HFA) 115-21 MCG/ACT inhaler  levalbuterol (XOPENEX HFA) 45 MCG/ACT inhaler

## 2014-12-23 ENCOUNTER — Telehealth: Payer: Self-pay | Admitting: *Deleted

## 2014-12-23 MED ORDER — LEVALBUTEROL TARTRATE 45 MCG/ACT IN AERO: INHALATION_SPRAY | RESPIRATORY_TRACT | Status: DC

## 2014-12-23 NOTE — Telephone Encounter (Signed)
Sig changed on medication per VO provider, after receiving fax from pharmacy requesting clarification; new instructions faxed to pharmacy/SLS

## 2014-12-26 ENCOUNTER — Telehealth: Payer: Self-pay | Admitting: Physician Assistant

## 2014-12-26 DIAGNOSIS — H532 Diplopia: Secondary | ICD-10-CM

## 2014-12-26 NOTE — Telephone Encounter (Signed)
I have placed referral

## 2014-12-26 NOTE — Telephone Encounter (Signed)
Dr. Jill Alexanders - [facility]  Pt calling for another referral for Neurology. She cancelled the appt that was previously scheduled by [place] at Dr. Georgie Chard office (01/26/15). She said she needs to be seen sooner than he could see her. Advised that we cannot guarantee that she can be seen sooner but that I would request the referral.

## 2014-12-30 ENCOUNTER — Ambulatory Visit: Payer: 59 | Admitting: Family Medicine

## 2015-01-04 ENCOUNTER — Emergency Department (HOSPITAL_BASED_OUTPATIENT_CLINIC_OR_DEPARTMENT_OTHER): Payer: 59

## 2015-01-04 ENCOUNTER — Emergency Department (HOSPITAL_BASED_OUTPATIENT_CLINIC_OR_DEPARTMENT_OTHER)
Admission: EM | Admit: 2015-01-04 | Discharge: 2015-01-04 | Disposition: A | Discharge status: Home / Self Care | Payer: 59 | Attending: Emergency Medicine | Admitting: Emergency Medicine

## 2015-01-04 ENCOUNTER — Encounter (HOSPITAL_BASED_OUTPATIENT_CLINIC_OR_DEPARTMENT_OTHER): Payer: Self-pay | Admitting: *Deleted

## 2015-01-04 DIAGNOSIS — D72829 Elevated white blood cell count, unspecified: Secondary | ICD-10-CM | POA: Diagnosis not present

## 2015-01-04 DIAGNOSIS — R0602 Shortness of breath: Secondary | ICD-10-CM | POA: Diagnosis present

## 2015-01-04 DIAGNOSIS — Z7951 Long term (current) use of inhaled steroids: Secondary | ICD-10-CM | POA: Insufficient documentation

## 2015-01-04 DIAGNOSIS — Z8673 Personal history of transient ischemic attack (TIA), and cerebral infarction without residual deficits: Secondary | ICD-10-CM | POA: Diagnosis not present

## 2015-01-04 DIAGNOSIS — Z8701 Personal history of pneumonia (recurrent): Secondary | ICD-10-CM | POA: Diagnosis not present

## 2015-01-04 DIAGNOSIS — Z87828 Personal history of other (healed) physical injury and trauma: Secondary | ICD-10-CM | POA: Insufficient documentation

## 2015-01-04 DIAGNOSIS — R011 Cardiac murmur, unspecified: Secondary | ICD-10-CM | POA: Insufficient documentation

## 2015-01-04 DIAGNOSIS — Z87442 Personal history of urinary calculi: Secondary | ICD-10-CM | POA: Insufficient documentation

## 2015-01-04 DIAGNOSIS — I10 Essential (primary) hypertension: Secondary | ICD-10-CM | POA: Diagnosis not present

## 2015-01-04 DIAGNOSIS — Z79899 Other long term (current) drug therapy: Secondary | ICD-10-CM | POA: Insufficient documentation

## 2015-01-04 DIAGNOSIS — J45901 Unspecified asthma with (acute) exacerbation: Secondary | ICD-10-CM | POA: Diagnosis not present

## 2015-01-04 DIAGNOSIS — F419 Anxiety disorder, unspecified: Secondary | ICD-10-CM | POA: Diagnosis not present

## 2015-01-04 DIAGNOSIS — E7800 Pure hypercholesterolemia, unspecified: Secondary | ICD-10-CM | POA: Insufficient documentation

## 2015-01-04 DIAGNOSIS — G40909 Epilepsy, unspecified, not intractable, without status epilepticus: Secondary | ICD-10-CM | POA: Diagnosis not present

## 2015-01-04 DIAGNOSIS — R112 Nausea with vomiting, unspecified: Secondary | ICD-10-CM | POA: Insufficient documentation

## 2015-01-04 DIAGNOSIS — G8929 Other chronic pain: Secondary | ICD-10-CM | POA: Diagnosis not present

## 2015-01-04 DIAGNOSIS — Z8709 Personal history of other diseases of the respiratory system: Secondary | ICD-10-CM | POA: Diagnosis not present

## 2015-01-04 DIAGNOSIS — R0789 Other chest pain: Secondary | ICD-10-CM

## 2015-01-04 DIAGNOSIS — R000 Tachycardia, unspecified: Secondary | ICD-10-CM | POA: Diagnosis not present

## 2015-01-04 DIAGNOSIS — Z87891 Personal history of nicotine dependence: Secondary | ICD-10-CM | POA: Insufficient documentation

## 2015-01-04 LAB — COMPREHENSIVE METABOLIC PANEL
ALBUMIN: 4.1 g/dL (ref 3.5–5.0)
ALK PHOS: 68 U/L (ref 38–126)
ALT: <5 U/L — ABNORMAL LOW (ref 14–54)
AST: 38 U/L (ref 15–41)
Anion gap: 13 (ref 5–15)
BILIRUBIN TOTAL: 0.7 mg/dL (ref 0.3–1.2)
BUN: 16 mg/dL (ref 6–20)
CALCIUM: 9.9 mg/dL (ref 8.9–10.3)
CO2: 21 mmol/L — ABNORMAL LOW (ref 22–32)
Chloride: 105 mmol/L (ref 101–111)
Creatinine, Ser: 0.74 mg/dL (ref 0.44–1.00)
GFR calc Af Amer: >60 mL/min (ref >60)
GFR calc non Af Amer: >60 mL/min (ref >60)
GLUCOSE: 305 mg/dL — AB (ref 65–99)
Potassium: 3.4 mmol/L — ABNORMAL LOW (ref 3.5–5.1)
Sodium: 139 mmol/L (ref 135–145)
TOTAL PROTEIN: 7.6 g/dL (ref 6.5–8.1)

## 2015-01-04 LAB — CBC WITH DIFFERENTIAL/PLATELET
Basophils Absolute: 0 10*3/uL (ref 0.0–0.1)
Basophils Relative: 0 %
EOS PCT: 0 %
Eosinophils Absolute: 0 10*3/uL (ref 0.0–0.7)
HEMATOCRIT: 41.3 % (ref 36.0–46.0)
HEMOGLOBIN: 13.8 g/dL (ref 12.0–15.0)
Lymphocytes Relative: 15 %
Lymphs Abs: 3.4 10*3/uL (ref 0.7–4.0)
MCH: 28.8 pg (ref 26.0–34.0)
MCHC: 33.4 g/dL (ref 30.0–36.0)
MCV: 86.2 fL (ref 78.0–100.0)
Monocytes Absolute: 0.3 10*3/uL (ref 0.1–1.0)
Monocytes Relative: 1 %
NEUTROS PCT: 84 %
Neutro Abs: 18.9 10*3/uL — ABNORMAL HIGH (ref 1.7–7.7)
Platelets: 264 10*3/uL (ref 150–400)
RBC: 4.79 MIL/uL (ref 3.87–5.11)
RDW: 14 % (ref 11.5–15.5)
WBC: 22.5 10*3/uL — AB (ref 4.0–10.5)

## 2015-01-04 LAB — RAPID URINE DRUG SCREEN, HOSP PERFORMED
AMPHETAMINES: NOT DETECTED
BARBITURATES: NOT DETECTED
BENZODIAZEPINES: POSITIVE — AB
Cocaine: NOT DETECTED
Opiates: NOT DETECTED
Tetrahydrocannabinol: NOT DETECTED

## 2015-01-04 LAB — URINALYSIS, ROUTINE W REFLEX MICROSCOPIC
Bilirubin Urine: NEGATIVE
GLUCOSE, UA: 250 mg/dL — AB
HGB URINE DIPSTICK: NEGATIVE
Ketones, ur: NEGATIVE mg/dL
Leukocytes, UA: NEGATIVE
Nitrite: NEGATIVE
PH: 7 (ref 5.0–8.0)
Protein, ur: NEGATIVE mg/dL
SPECIFIC GRAVITY, URINE: 1.009 (ref 1.005–1.030)
Urobilinogen, UA: 0.2 mg/dL (ref 0.0–1.0)

## 2015-01-04 LAB — LIPASE, BLOOD: Lipase: 37 U/L (ref 11–51)

## 2015-01-04 LAB — ETHANOL

## 2015-01-04 LAB — D-DIMER, QUANTITATIVE (NOT AT ARMC): D DIMER QUANT: 0.3 ug{FEU}/mL (ref 0.00–0.48)

## 2015-01-04 LAB — TROPONIN I
Troponin I: <0.03 ng/mL (ref <0.031)
Troponin I: <0.03 ng/mL (ref <0.031)

## 2015-01-04 LAB — BRAIN NATRIURETIC PEPTIDE: B Natriuretic Peptide: 27.1 pg/mL (ref 0.0–100.0)

## 2015-01-04 MED ORDER — ONDANSETRON HCL 4 MG/2ML IJ SOLN
4.0000 mg | Freq: Once | INTRAMUSCULAR | Status: AC
  Filled 2015-01-04: qty 2

## 2015-01-04 MED ORDER — ACETAMINOPHEN 325 MG PO TABS
ORAL_TABLET | ORAL | Status: AC
  Filled 2015-01-04: qty 2

## 2015-01-04 MED ORDER — SODIUM CHLORIDE 0.9 % IV BOLUS (SEPSIS): 1000.0000 mL | Freq: Once | INTRAVENOUS | Status: AC

## 2015-01-04 NOTE — ED Notes (Addendum)
C/o feeling sob today but worse this evening  Denies any fevers. Hx of asthma. No distress on arrival to the ED. C/o feeling a little soreness in her chest. C/o vomiting on and off today.

## 2015-01-04 NOTE — ED Provider Notes (Signed)
CSN: [id number]     Arrival date & time 01/04/15  0133 History   First MD Initiated Contact with Patient 01/04/15 0138     Chief Complaint  Patient presents with  . Shortness of Breath     (Consider location/radiation/quality/duration/timing/severity/associated sxs/prior Treatment) HPI Patient presents with acute onset shortness of breath just prior to arrival. States she's had a mild cough without sputum production. No fever or chills. Complains of left-sided chest pain worse with deep breathing and palpation. She also admits to vomiting 15 times today. She denies any abdominal pain. No lower extremity swelling or discomfort. Patient denies history of chronic alcohol consumption. Denies prior use of inhaler or nebulizer. Past Medical History  Diagnosis Date  . Schizophrenia ([place])     treated by Dr. Casimiro Needle  . Hypertension   . High cholesterol   . Heart murmur   . Asthma   . Pneumonia ~ 2009  . History of bronchitis     "last time ~ 2009, before I had pneumonia"  . Hypoglycemia   . Migraine     "used to have them often"  . Seizures ([place])     last seizure 3 years ago, not on AED  . Seizure ([place]) 07/07/11  . Stroke ([place]) 07/08/11    "I've had mini strokes before; left side of face  is more down than right "  . Chronic high back pain   . Kidney stone   . Anxiety   . PTSD (post-traumatic stress disorder)   . Double vision   . Hyperparathyroidism ([place])   . Meniscus tear     Right   Past Surgical History  Procedure Laterality Date  . Cochlear implant  2010    left  . Tonsillectomy      "as a child"  . Inguinal hernia repair  1985    left  . Kidney stone surgery  ~ 2006  . Wisdom tooth extraction     Family History  Problem Relation Age of Onset  . Heart attack Father 56    Living  . Leukemia Mother 37    Deceased  . Arthritis Father   . Skin cancer Father   . Hypertension Father   . Hyperlipidemia Father   . Alzheimer's disease Paternal Aunt     X2  . Stomach  cancer Paternal Aunt     x1  . Arthritis/Rheumatoid Paternal Aunt     x1  . Neuropathy Brother     Peripheal  . Fibromyalgia Sister   . HIV Sister   . Arthritis/Rheumatoid Sister   . Diabetes Paternal Grandmother    Social History  Substance Use Topics  . Smoking status: Former Smoker -- 1.00 packs/day for 20 years    Types: Cigarettes    Quit date: 02/29/2004  . Smokeless tobacco: Never Used  . Alcohol Use: Yes     Comment: 07/08/11 "glass of wine q once in awhile; not very often; do it on special occasion"   OB History    No data available     Review of Systems  Constitutional: Negative for fever and chills.  Respiratory: Positive for cough and shortness of breath. Negative for wheezing.   Cardiovascular: Positive for chest pain. Negative for palpitations and leg swelling.  Gastrointestinal: Positive for nausea and vomiting. Negative for abdominal pain, diarrhea, constipation and blood in stool.  Musculoskeletal: Negative for back pain, neck pain and neck stiffness.  Skin: Negative for rash and wound.  Neurological: Negative for dizziness, weakness,  light-headedness, numbness and headaches.  All other systems reviewed and are negative.     Allergies  Sulfa antibiotics; Metronidazole; Benadryl; Cephalosporins; Codeine; Diclofenac sodium; Nitrofurantoin monohyd macro; Prednisone; Tizanidine; Baclofen; Meloxicam; and Sulfamethoxazole  Home Medications   Prior to Admission medications   Medication Sig Start Date End Date Taking? Authorizing Provider  cyanocobalamin 500 MCG tablet Take 500 mcg by mouth daily.    Historical Provider, MD  fluticasone (FLONASE) 50 MCG/ACT nasal spray Place 2 sprays into both nostrils daily.    Historical Provider, MD  fluticasone-salmeterol (ADVAIR HFA) 115-21 MCG/ACT inhaler Inhale 1 puff into the lungs 2 (two) times daily. 12/22/14   Brunetta Jeans, PA-C  gabapentin (NEURONTIN) 100 MG capsule Take 100 mg by mouth daily.    Historical  Provider, MD  levalbuterol [facility] HFA) 45 MCG/ACT inhaler INHALE 2 PUFFS EVERY 4-6 HOURS AS NEEDED 12/23/14   Brunetta Jeans, PA-C  LORazepam (ATIVAN) 1 MG tablet Take 2 tablets by mouth twice a day For anxiety    Historical Provider, MD  losartan (COZAAR) 25 MG tablet Take 1 tablet (25 mg total) by mouth 2 (two) times daily. 12/22/14   Brunetta Jeans, PA-C  magnesium 30 MG tablet Take 30 mg by mouth daily.    Historical Provider, MD  metoprolol (LOPRESSOR) 50 MG tablet Take 50 mg by mouth daily.    Historical Provider, MD  metoprolol tartrate (LOPRESSOR) 25 MG tablet Take 25 mg by mouth daily.     Historical Provider, MD  omega-3 fish oil (MAXEPA) 1000 MG CAPS capsule Take by mouth.    Historical Provider, MD  omeprazole (PRILOSEC) 40 MG capsule Take 40 mg by mouth daily.    Historical Provider, MD  orphenadrine (NORFLEX) 100 MG tablet Take 100 mg by mouth 2 (two) times daily as needed.    Historical Provider, MD  POTASSIUM BICARBONATE PO 395mg  each.  Pt takes 2 tablets daily.    Historical Provider, MD  promethazine (PHENERGAN) 12.5 MG tablet Take 12.5 mg by mouth every 6 (six) hours as needed.    Historical Provider, MD  rosuvastatin (CRESTOR) 20 MG tablet Take 1 tablet (20 mg total) by mouth every evening. 12/22/14   Brunetta Jeans, PA-C  temazepam (RESTORIL) 15 MG capsule Take 30 mg by mouth at bedtime as needed.    Historical Provider, MD  traMADol (ULTRAM) 50 MG tablet Take 50 mg by mouth. Every 6-8 hours as needed for pain 06/17/14   Historical Provider, MD  zolpidem (AMBIEN) 10 MG tablet Take 20 mg by mouth at bedtime as needed.    Historical Provider, MD   BP 135/73 mmHg  Pulse 73  Temp(Src) 98 F (36.7 C) (Oral)  Resp 20  SpO2 96% Physical Exam  Constitutional: She is oriented to person, place, and time. She appears well-developed and well-nourished. No distress.  HENT:  Head: Normocephalic and atraumatic.  Mouth/Throat: Oropharynx is clear and moist. No oropharyngeal  exudate.  Eyes: EOM are normal. Pupils are equal, round, and reactive to light.  Neck: Normal range of motion. Neck supple. No JVD present.  Cardiovascular: Regular rhythm.  Exam reveals no gallop and no friction rub.   No murmur heard. Tachycardia  Pulmonary/Chest: Effort normal and breath sounds normal. No respiratory distress. She has no wheezes. She has no rales. She exhibits tenderness (chest with tenderness to palpation over the left pectoralis. There is no crepitance or deformity.).  Abdominal: Soft. Bowel sounds are normal. She exhibits no distension and no mass.  There is no tenderness. There is no rebound and no guarding.  Musculoskeletal: Normal range of motion. She exhibits no edema or tenderness.  No calf swelling or tenderness. Distal pulses intact.  Neurological: She is alert and oriented to person, place, and time.  Moves all extremities without deficit. Sensation is fully intact. Fine tremor  Skin: Skin is warm and dry. No rash noted. No erythema.  Psychiatric:  Mildly anxious  Nursing note and vitals reviewed.   ED Course  Procedures (including critical care time) Labs Review Labs Reviewed  CBC WITH DIFFERENTIAL/PLATELET - Abnormal; Notable for the following:    WBC 22.5 (*)    Neutro Abs 18.9 (*)    All other components within normal limits  COMPREHENSIVE METABOLIC PANEL - Abnormal; Notable for the following:    Potassium 3.4 (*)    CO2 21 (*)    Glucose, Bld 305 (*)    ALT <5 (*)    All other components within normal limits  URINALYSIS, ROUTINE W REFLEX MICROSCOPIC (NOT AT [facility]) - Abnormal; Notable for the following:    Glucose, UA 250 (*)    All other components within normal limits  URINE RAPID DRUG SCREEN, HOSP PERFORMED - Abnormal; Notable for the following:    Benzodiazepines POSITIVE (*)    All other components within normal limits  BRAIN NATRIURETIC PEPTIDE  LIPASE, BLOOD  ETHANOL  D-DIMER, QUANTITATIVE (NOT AT [facility])  TROPONIN I  TROPONIN I     Imaging Review Dg Chest 2 View  01/04/2015  CLINICAL DATA:  Dyspnea, onset today.  Worsening. EXAM: CHEST  2 VIEW COMPARISON:  12/09/2014 FINDINGS: The heart size and mediastinal contours are within normal limits. Both lungs are clear. The visualized skeletal structures are unremarkable. IMPRESSION: No active cardiopulmonary disease. Electronically Signed   By: Andreas Newport M.D.   On: 01/04/2015 01:00   I have personally reviewed and evaluated these images and lab results as part of my medical decision-making.  ED ECG REPORT   Date: 01/04/2015  Rate: 101  Rhythm: Sinus tachycardia  QRS Axis: normal  Intervals: normal  ST/T Wave abnormalities: nonspecific T wave changes  Conduction Disutrbances:none  Narrative Interpretation:   Old EKG Reviewed: changes noted  I have personally reviewed the EKG tracing and agree with the computerized printout as noted.   MDM   Final diagnoses:  Left-sided chest wall pain  Leukocytosis   Patient states that her chest pain is improved after Tylenol. Husband relates that the patient was recently admitted to [facility] with similar chest pain. Had normal stress test. Noted to have inverted T waves. Patient also had leukocytosis of unknown origin.  Your patient has elevated white blood cell count. Initial tachycardia has resolved. Patient has bifid T waves in lateral and inferior leads. She does have a deep Q wave in lead III. Her initial troponin is normal. Her d-dimer is normal. I have low suspicion that her symptoms are cardiac in origin. Her chest pain is completely reproduced with palpation over the left chest. We will repeat troponin at 3 hours. Anticipate discharge to follow-up with cardiology.   Heart rate now in the 80s. Patient is comfortable. Troponin 2 is normal. Initial long QT has resolved with repeat EKG. Patient has been advised to follow-up with cardiology. Return precautions given.  Julianne Rice, MD 01/04/15 [phone]

## 2015-01-04 NOTE — Discharge Instructions (Signed)
Chest Wall Pain Chest wall pain is pain in or around the bones and muscles of your chest. Sometimes, an injury causes this pain. Sometimes, the cause may not be known. This pain may take several weeks or longer to get better. HOME CARE INSTRUCTIONS  Pay attention to any changes in your symptoms. Take these actions to help with your pain:   Rest as told by your health care provider.   Avoid activities that cause pain. These include any activities that use your chest muscles or your abdominal and side muscles to lift heavy items.   If directed, apply ice to the painful area:  Put ice in a plastic bag.  Place a towel between your skin and the bag.  Leave the ice on for 20 minutes, 2-3 times per day.  Take over-the-counter and prescription medicines only as told by your health care provider.  Do not use tobacco products, including cigarettes, chewing tobacco, and e-cigarettes. If you need help quitting, ask your health care provider.  Keep all follow-up visits as told by your health care provider. This is important. SEEK MEDICAL CARE IF:  You have a fever.  Your chest pain becomes worse.  You have new symptoms. SEEK IMMEDIATE MEDICAL CARE IF:  You have nausea or vomiting.  You feel sweaty or light-headed.  You have a cough with phlegm (sputum) or you cough up blood.  You develop shortness of breath.   This information is not intended to replace advice given to you by your health care provider. Make sure you discuss any questions you have with your health care provider.   Document Released: 02/14/2005 Document Revised: 11/05/2014 Document Reviewed: 05/12/2014 Elsevier Interactive Patient Education 2016 [employer]  Leukocytosis Leukocytosis means you have more white blood cells than normal. White blood cells are made in your bone marrow. The main job of white blood cells is to fight infection. Having too many white blood cells is a common condition. It can develop as a  result of many types of medical problems. CAUSES  In some cases, your bone marrow may be normal, but it is still making too many white blood cells. This could be the result of:  Infection.  Injury.  Physical stress.  Emotional stress.  Surgery.  Allergic reactions.  Tumors that do not start in the blood or bone marrow.  An inherited disease.  Certain medicines.  Pregnancy and labor. In other cases, you may have a bone marrow disorder that is causing your body to make too many white blood cells. Bone marrow disorders include:  Leukemia. This is a type of blood cancer.  Myeloproliferative disorders. These disorders cause blood cells to grow abnormally. SYMPTOMS  Some people have no symptoms. Others have symptoms due to the medical problem that is causing their leukocytosis. These symptoms may include:  Bleeding.  Bruising.  Fever.  Night sweats.  Repeated infections.  Weakness.  Weight loss. DIAGNOSIS  Leukocytosis is often found during blood tests that are done as part of a normal physical exam. Your caregiver will probably order other tests to help determine why you have too many white blood cells. These tests may include:  A complete blood count (CBC). This test measures all the types of blood cells in your body.  Chest X-rays, urine tests (urinalysis), or other tests to look for signs of infection.  Bone marrow aspiration. For this test, a needle is put into your bone. Cells from the bone marrow are removed through the needle. The cells  are then examined under a microscope. TREATMENT  Treatment is usually not needed for leukocytosis. However, if a disorder is causing your leukocytosis, it will need to be treated. Treatment may include:  Antibiotic medicines if you have a bacterial infection.  Bone marrow transplant. Your diseased bone marrow is replaced with healthy cells that will grow new bone marrow.  Chemotherapy. This is the use of drugs to kill  cancer cells. HOME CARE INSTRUCTIONS  Only take over-the-counter or prescription medicines as directed by your caregiver.  Maintain a healthy weight. Ask your caregiver what weight is best for you.  Eat foods that are low in saturated fats and high in fiber. Eat plenty of fruits and vegetables.  Drink enough fluids to keep your urine clear or pale yellow.  Get 30 minutes of exercise at least 5 times a week. Check with your caregiver before starting a new exercise routine.  Limit caffeine and alcohol.  Do not smoke.  Keep all follow-up appointments as directed by your caregiver. SEEK MEDICAL CARE IF:  You feel weak or more tired than usual.  You develop chills, a cough, or nasal congestion.  You lose weight without trying.  You have night sweats.  You bruise easily. SEEK IMMEDIATE MEDICAL CARE IF:  You bleed more than normal.  You have chest pain.  You have trouble breathing.  You have a fever.  You have uncontrolled nausea or vomiting.  You feel dizzy or lightheaded. MAKE SURE YOU:  Understand these instructions.  Will watch your condition.  Will get help right away if you are not doing well or get worse.   This information is not intended to replace advice given to you by your health care provider. Make sure you discuss any questions you have with your health care provider.   Document Released: 02/03/2011 Document Revised: 05/09/2011 Document Reviewed: 08/18/2014 Elsevier Interactive Patient Education [employer].

## 2015-01-04 NOTE — ED Notes (Signed)
Pt continues to feel tightness in chest. Awaiting labs and chest xray, updated on wait.

## 2015-01-06 ENCOUNTER — Ambulatory Visit (INDEPENDENT_AMBULATORY_CARE_PROVIDER_SITE_OTHER): Payer: 59 | Admitting: Physician Assistant

## 2015-01-06 ENCOUNTER — Encounter: Payer: Self-pay | Admitting: Physician Assistant

## 2015-01-06 VITALS — BP 121/63 | HR 60 | Temp 98.2°F | Resp 18 | Ht 63.25 in | Wt 188.4 lb

## 2015-01-06 DIAGNOSIS — J208 Acute bronchitis due to other specified organisms: Principal | ICD-10-CM

## 2015-01-06 DIAGNOSIS — B9689 Other specified bacterial agents as the cause of diseases classified elsewhere: Secondary | ICD-10-CM

## 2015-01-06 DIAGNOSIS — J00 Acute nasopharyngitis [common cold]: Secondary | ICD-10-CM | POA: Diagnosis not present

## 2015-01-06 MED ORDER — METOPROLOL TARTRATE 25 MG PO TABS: 25.0000 mg | ORAL_TABLET | Freq: Every day | ORAL | Status: DC

## 2015-01-06 MED ORDER — BENZONATATE 100 MG PO CAPS: 100.0000 mg | ORAL_CAPSULE | Freq: Two times a day (BID) | ORAL | Status: DC | PRN

## 2015-01-06 MED ORDER — ALBUTEROL SULFATE (2.5 MG/3ML) 0.083% IN NEBU: 2.5000 mg | INHALATION_SOLUTION | Freq: Four times a day (QID) | RESPIRATORY_TRACT | Status: DC | PRN

## 2015-01-06 MED ORDER — LEVOFLOXACIN 500 MG PO TABS: 500.0000 mg | ORAL_TABLET | Freq: Every day | ORAL | Status: DC

## 2015-01-06 NOTE — Progress Notes (Signed)
Patient presents to clinic today c/o 1 month of chest congestion and productive cough associated with intermittent wheezing. Denies fever, chills. Had some mild chest pain and went to ER on 01/04/15. Workup including troponin, EKG and CXR negative. WBC noted to be 22.5 but was not addressed at that time. No antibiotics were given.  Past Medical History  Diagnosis Date  . Schizophrenia ([place])     treated by Dr. Casimiro Needle  . Hypertension   . High cholesterol   . Heart murmur   . Asthma   . Pneumonia ~ 2009  . History of bronchitis     "last time ~ 2009, before I had pneumonia"  . Hypoglycemia   . Migraine     "used to have them often"  . Seizures ([place])     last seizure 3 years ago, not on AED  . Seizure ([place]) 07/07/11  . Stroke ([place]) 07/08/11    "I've had mini strokes before; left side of face  is more down than right "  . Chronic high back pain   . Kidney stone   . Anxiety   . PTSD (post-traumatic stress disorder)   . Double vision   . Hyperparathyroidism ([place])   . Meniscus tear     Right    Current Outpatient Prescriptions on File Prior to Visit  Medication Sig Dispense Refill  . cyanocobalamin 500 MCG tablet Take 500 mcg by mouth daily.    . fluticasone (FLONASE) 50 MCG/ACT nasal spray Place 2 sprays into both nostrils daily.    . fluticasone-salmeterol (ADVAIR HFA) 115-21 MCG/ACT inhaler Inhale 1 puff into the lungs 2 (two) times daily. 1 Inhaler 5  . gabapentin (NEURONTIN) 100 MG capsule Take 100 mg by mouth daily.    Marland Kitchen levalbuterol (XOPENEX HFA) 45 MCG/ACT inhaler INHALE 2 PUFFS EVERY 4-6 HOURS AS NEEDED 1 Inhaler 5  . LORazepam (ATIVAN) 1 MG tablet Take 2 tablets by mouth twice a day For anxiety    . losartan (COZAAR) 25 MG tablet Take 1 tablet (25 mg total) by mouth 2 (two) times daily. 60 tablet 3  . magnesium 30 MG tablet Take 30 mg by mouth daily.    . metoprolol (LOPRESSOR) 50 MG tablet Take 50 mg by mouth daily.    Marland Kitchen omega-3 fish oil (MAXEPA) 1000 MG CAPS capsule  Take by mouth.    Marland Kitchen omeprazole (PRILOSEC) 40 MG capsule Take 40 mg by mouth daily.    . orphenadrine (NORFLEX) 100 MG tablet Take 100 mg by mouth 2 (two) times daily as needed.    Marland Kitchen POTASSIUM BICARBONATE PO 354m each.  Pt takes 2 tablets daily.    . promethazine (PHENERGAN) 12.5 MG tablet Take 12.5 mg by mouth every 6 (six) hours as needed.    . rosuvastatin (CRESTOR) 20 MG tablet Take 1 tablet (20 mg total) by mouth every evening. 30 tablet 3  . temazepam (RESTORIL) 15 MG capsule Take 30 mg by mouth at bedtime as needed.    . traMADol (ULTRAM) 50 MG tablet Take 50 mg by mouth. Every 6-8 hours as needed for pain    . zolpidem (AMBIEN) 10 MG tablet Take 20 mg by mouth at bedtime as needed.     No current facility-administered medications on file prior to visit.    Allergies  Allergen Reactions  . Sulfa Antibiotics Hives  . Metronidazole Nausea And Vomiting  . Benadryl [Diphenhydramine Hcl]     Severe emotional reaction and doesn't work well with  Pt. Past history  . Cephalosporins Hives  . Codeine Other (See Comments)    Makes patient feel odd ; "sometimes mild; sometimes severe reaction; mostly severe"  . Diclofenac Sodium Nausea And Vomiting  . Nitrofurantoin Monohyd Macro     Felt funny  . Prednisone Other (See Comments)    migraine  . Tizanidine     Other reaction(s): Other (See Comments) insomnia  . Baclofen Nausea Only  . Meloxicam     Other reaction(s): Confusion  . Sulfamethoxazole Rash    Family History  Problem Relation Age of Onset  . Heart attack Father 51    Living  . Leukemia Mother 81    Deceased  . Arthritis Father   . Skin cancer Father   . Hypertension Father   . Hyperlipidemia Father   . Alzheimer's disease Paternal Aunt     X2  . Stomach cancer Paternal Aunt     x1  . Arthritis/Rheumatoid Paternal Aunt     x1  . Neuropathy Brother     Peripheal  . Fibromyalgia Sister   . HIV Sister   . Arthritis/Rheumatoid Sister   . Diabetes Paternal  Grandmother     Social History   Social History  . Marital Status: Legally Separated    Spouse Name: N/A  . Number of Children: N/A  . Years of Education: N/A   Social History Main Topics  . Smoking status: Former Smoker -- 1.00 packs/day for 20 years    Types: Cigarettes    Quit date: 02/29/2004  . Smokeless tobacco: Never Used  . Alcohol Use: Yes     Comment: 07/08/11 "glass of wine q once in awhile; not very often; do it on special occasion"  . Drug Use: No  . Sexual Activity: No   Other Topics Concern  . None   Social History Narrative   Completed 2 years of college.  Patient reports living in an apartment by herself, and able to perform all ADL's by herself.  Patient legally separated, 73 y/o son who lives with his father and visits the patient on weekends.  On disability.   Review of Systems - See HPI.  All other ROS are negative.  BP 121/63 mmHg  Pulse 60  Temp(Src) 98.2 F (36.8 C) (Oral)  Resp 18  Ht 5' 3.25" (1.607 m)  Wt 188 lb 6 oz (85.446 kg)  BMI 33.09 kg/m2  SpO2 96%  Physical Exam  Constitutional: She is oriented to person, place, and time and well-developed, well-nourished, and in no distress.  HENT:  Head: Normocephalic and atraumatic.  Right Ear: External ear normal.  Left Ear: External ear normal.  Nose: Nose normal.  Mouth/Throat: Oropharynx is clear and moist. No oropharyngeal exudate.  TM within normal limits bilaterally.  Eyes: Conjunctivae are normal.  Neck: Neck supple.  Cardiovascular: Normal rate, regular rhythm, normal heart sounds and intact distal pulses.   Pulmonary/Chest: Effort normal and breath sounds normal. No respiratory distress. She has no wheezes. She has no rales. She exhibits no tenderness.  Neurological: She is alert and oriented to person, place, and time.  Skin: Skin is warm and dry. No rash noted.  Psychiatric: Affect normal.  Vitals reviewed.   Recent Results (from the past 2160 hour(s))  CBC     Status: None    Collection Time: 12/05/14  8:54 AM  Result Value Ref Range   WBC 8.6 4.0 - 10.5 K/uL   RBC 4.63 3.87 - 5.11 Mil/uL   Platelets  225.0 150.0 - 400.0 K/uL   Hemoglobin 13.3 12.0 - 15.0 g/dL   HCT 40.2 36.0 - 46.0 %   MCV 86.9 78.0 - 100.0 fl   MCHC 33.1 30.0 - 36.0 g/dL   RDW 14.6 11.5 - 15.5 %  TSH     Status: None   Collection Time: 12/05/14  8:54 AM  Result Value Ref Range   TSH 1.59 0.35 - 4.50 uIU/mL  Comp Met (CMET)     Status: Abnormal   Collection Time: 12/05/14  8:54 AM  Result Value Ref Range   Sodium 142 135 - 145 mEq/L   Potassium 4.2 3.5 - 5.1 mEq/L   Chloride 110 96 - 112 mEq/L   CO2 29 19 - 32 mEq/L   Glucose, Bld 121 (H) 70 - 99 mg/dL   BUN 10 6 - 23 mg/dL   Creatinine, Ser 0.67 0.40 - 1.20 mg/dL   Total Bilirubin 0.3 0.2 - 1.2 mg/dL   Alkaline Phosphatase 59 39 - 117 U/L   AST 18 0 - 37 U/L   ALT 32 0 - 35 U/L   Total Protein 6.2 6.0 - 8.3 g/dL   Albumin 3.8 3.5 - 5.2 g/dL   Calcium 9.8 8.4 - 10.5 mg/dL   GFR 96.19 >60.00 mL/min  INR/PT     Status: Abnormal   Collection Time: 12/09/14  7:42 AM  Result Value Ref Range   INR 1.1 (H) 0.8 - 1.0 ratio   Prothrombin Time 12.3 9.6 - 13.1 sec  Urinalysis, Routine w reflex microscopic (not at [facility])     Status: None   Collection Time: 12/09/14  7:42 AM  Result Value Ref Range   Color, Urine YELLOW Yellow;Lt. Yellow   APPearance CLEAR Clear   Specific Gravity, Urine 1.015 1.000-1.030   pH 6.5 5.0 - 8.0   Total Protein, Urine NEGATIVE Negative   Urine Glucose NEGATIVE Negative   Ketones, ur NEGATIVE Negative   Bilirubin Urine NEGATIVE Negative   Hgb urine dipstick NEGATIVE Negative   Urobilinogen, UA 0.2 0.0 - 1.0   Leukocytes, UA NEGATIVE Negative   Nitrite NEGATIVE Negative   WBC, UA none seen 0-2/hpf   RBC / HPF none seen 0-2/hpf   Squamous Epithelial / LPF Rare(0-4/hpf) Rare(0-4/hpf)  Urine Culture     Status: None   Collection Time: 12/09/14  7:42 AM  Result Value Ref Range   Colony Count 8,000  COLONIES/ML    Organism ID, Bacteria Insignificant Growth   CBC with Differential/Platelet     Status: Abnormal   Collection Time: 01/04/15  1:45 AM  Result Value Ref Range   WBC 22.5 (H) 4.0 - 10.5 K/uL   RBC 4.79 3.87 - 5.11 MIL/uL   Hemoglobin 13.8 12.0 - 15.0 g/dL   HCT 41.3 36.0 - 46.0 %   MCV 86.2 78.0 - 100.0 fL   MCH 28.8 26.0 - 34.0 pg   MCHC 33.4 30.0 - 36.0 g/dL   RDW 14.0 11.5 - 15.5 %   Platelets 264 150 - 400 K/uL   Neutrophils Relative % 84 %   Neutro Abs 18.9 (H) 1.7 - 7.7 K/uL   Lymphocytes Relative 15 %   Lymphs Abs 3.4 0.7 - 4.0 K/uL   Monocytes Relative 1 %   Monocytes Absolute 0.3 0.1 - 1.0 K/uL   Eosinophils Relative 0 %   Eosinophils Absolute 0.0 0.0 - 0.7 K/uL   Basophils Relative 0 %   Basophils Absolute 0.0 0.0 - 0.1 K/uL  WBC Morphology WHITE COUNT CONFIRMED ON SMEAR    Smear Review PLATELET COUNT CONFIRMED BY SMEAR     Comment: MORPHOLOGY UNREMARKABLE  Comprehensive metabolic panel     Status: Abnormal   Collection Time: 01/04/15  1:45 AM  Result Value Ref Range   Sodium 139 135 - 145 mmol/L   Potassium 3.4 (L) 3.5 - 5.1 mmol/L   Chloride 105 101 - 111 mmol/L   CO2 21 (L) 22 - 32 mmol/L   Glucose, Bld 305 (H) 65 - 99 mg/dL   BUN 16 6 - 20 mg/dL   Creatinine, Ser 0.74 0.44 - 1.00 mg/dL   Calcium 9.9 8.9 - 10.3 mg/dL   Total Protein 7.6 6.5 - 8.1 g/dL   Albumin 4.1 3.5 - 5.0 g/dL   AST 38 15 - 41 U/L   ALT <5 (L) 14 - 54 U/L   Alkaline Phosphatase 68 38 - 126 U/L   Total Bilirubin 0.7 0.3 - 1.2 mg/dL   GFR calc non Af Amer >60 >60 mL/min   GFR calc Af Amer >60 >60 mL/min    Comment: (NOTE) The eGFR has been calculated using the CKD EPI equation. This calculation has not been validated in all clinical situations. eGFR's persistently <60 mL/min signify possible Chronic Kidney Disease.    Anion gap 13 5 - 15  Brain natriuretic peptide     Status: None   Collection Time: 01/04/15  1:45 AM  Result Value Ref Range   B Natriuretic Peptide  27.1 0.0 - 100.0 pg/mL  Lipase, blood     Status: None   Collection Time: 01/04/15  1:45 AM  Result Value Ref Range   Lipase 37 11 - 51 U/L  Ethanol     Status: None   Collection Time: 01/04/15  1:45 AM  Result Value Ref Range   Alcohol, Ethyl (B) <5 <5 mg/dL    Comment:        LOWEST DETECTABLE LIMIT FOR SERUM ALCOHOL IS 5 mg/dL FOR MEDICAL PURPOSES ONLY   D-dimer, quantitative (not at [facility])     Status: None   Collection Time: 01/04/15  1:45 AM  Result Value Ref Range   D-Dimer, Quant 0.30 0.00 - 0.48 ug/mL-FEU    Comment:        AT THE INHOUSE ESTABLISHED CUTOFF VALUE OF 0.48 ug/mL FEU, THIS ASSAY HAS BEEN DOCUMENTED IN THE LITERATURE TO HAVE A SENSITIVITY AND NEGATIVE PREDICTIVE VALUE OF AT LEAST 98 TO 99%.  THE TEST RESULT SHOULD BE CORRELATED WITH AN ASSESSMENT OF THE CLINICAL PROBABILITY OF DVT / VTE.   Troponin I     Status: None   Collection Time: 01/04/15  1:45 AM  Result Value Ref Range   Troponin I <0.03 <0.031 ng/mL    Comment:        NO INDICATION OF MYOCARDIAL INJURY.   Urinalysis, Routine w reflex microscopic (not at [facility])     Status: Abnormal   Collection Time: 01/04/15  2:30 AM  Result Value Ref Range   Color, Urine YELLOW YELLOW   APPearance CLEAR CLEAR   Specific Gravity, Urine 1.009 1.005 - 1.030   pH 7.0 5.0 - 8.0   Glucose, UA 250 (A) NEGATIVE mg/dL   Hgb urine dipstick NEGATIVE NEGATIVE   Bilirubin Urine NEGATIVE NEGATIVE   Ketones, ur NEGATIVE NEGATIVE mg/dL   Protein, ur NEGATIVE NEGATIVE mg/dL   Urobilinogen, UA 0.2 0.0 - 1.0 mg/dL   Nitrite NEGATIVE NEGATIVE   Leukocytes, UA NEGATIVE NEGATIVE  Comment: MICROSCOPIC NOT DONE ON URINES WITH NEGATIVE PROTEIN, BLOOD, LEUKOCYTES, NITRITE, OR GLUCOSE <1000 mg/dL.  Urine rapid drug screen (hosp performed)     Status: Abnormal   Collection Time: 01/04/15  2:30 AM  Result Value Ref Range   Opiates NONE DETECTED NONE DETECTED   Cocaine NONE DETECTED NONE DETECTED   Benzodiazepines POSITIVE  (A) NONE DETECTED   Amphetamines NONE DETECTED NONE DETECTED   Tetrahydrocannabinol NONE DETECTED NONE DETECTED   Barbiturates NONE DETECTED NONE DETECTED    Comment:        DRUG SCREEN FOR MEDICAL PURPOSES ONLY.  IF CONFIRMATION IS NEEDED FOR ANY PURPOSE, NOTIFY LAB WITHIN 5 DAYS.        LOWEST DETECTABLE LIMITS FOR URINE DRUG SCREEN Drug Class       Cutoff (ng/mL) Amphetamine      1000 Barbiturate      200 Benzodiazepine   220 Tricyclics       254 Opiates          300 Cocaine          300 THC              50   Troponin I     Status: None   Collection Time: 01/04/15  5:26 AM  Result Value Ref Range   Troponin I <0.03 <0.031 ng/mL    Comment:        NO INDICATION OF MYOCARDIAL INJURY.     Assessment/Plan: Acute bacterial bronchitis With leukocytosis. CXR negative. Will start Levaquin 500 mg x 7 days. Rx Tessalon. Continue asthma medications. Start plain Mucinex. Increase fluids and rest. Follow-up Friday/Monday to reassess and obtain repeat CBC.

## 2015-01-06 NOTE — Assessment & Plan Note (Signed)
With leukocytosis. CXR negative. Will start Levaquin 500 mg x 7 days. Rx Tessalon. Continue asthma medications. Start plain Mucinex. Increase fluids and rest. Follow-up Friday/Monday to reassess and obtain repeat CBC.

## 2015-01-06 NOTE — Patient Instructions (Signed)
Take antibiotic (Levaquin) as directed.  Increase fluids.  Get plenty of rest. Use Mucinex for congestion. Use Tessalon as directed for cough. Take a daily probiotic (I recommend Align or Culturelle, but even Activia Yogurt may be beneficial). Continue inhalers as directed  A humidifier placed in the bedroom may offer some relief for a dry, scratchy throat of nasal irritation.  Read information below on acute bronchitis  Follow-up on Monday Acute Bronchitis Bronchitis is when the airways that extend from the windpipe into the lungs get red, puffy, and painful (inflamed). Bronchitis often causes thick spit (mucus) to develop. This leads to a cough. A cough is the most common symptom of bronchitis. In acute bronchitis, the condition usually begins suddenly and goes away over time (usually in 2 weeks). Smoking, allergies, and asthma can make bronchitis worse. Repeated episodes of bronchitis may cause more lung problems.  HOME CARE  Rest.  Drink enough fluids to keep your pee (urine) clear or pale yellow (unless you need to limit fluids as told by your doctor).  Only take over-the-counter or prescription medicines as told by your doctor.  Avoid smoking and secondhand smoke. These can make bronchitis worse. If you are a smoker, think about using nicotine gum or skin patches. Quitting smoking will help your lungs heal faster.  Reduce the chance of getting bronchitis again by:  Washing your hands often.  Avoiding people with cold symptoms.  Trying not to touch your hands to your mouth, nose, or eyes.  Follow up with your doctor as told.  GET HELP IF: Your symptoms do not improve after 1 week of treatment. Symptoms include:  Cough.  Fever.  Coughing up thick spit.  Body aches.  Chest congestion.  Chills.  Shortness of breath.  Sore throat.  GET HELP RIGHT AWAY IF:   You have an increased fever.  You have chills.  You have severe shortness of breath.  You have bloody  thick spit (sputum).  You throw up (vomit) often.  You lose too much body fluid (dehydration).  You have a severe headache.  You faint.  MAKE SURE YOU:   Understand these instructions.  Will watch your condition.  Will get help right away if you are not doing well or get worse. Document Released: 08/03/2007 Document Revised: 10/17/2012 Document Reviewed: 08/07/2012 [facility] Patient Information 2015 [place], [place]. This information is not intended to replace advice given to you by your health care provider. Make sure you discuss any questions you have with your health care provider.

## 2015-01-09 ENCOUNTER — Encounter: Payer: Self-pay | Admitting: Physician Assistant

## 2015-01-09 ENCOUNTER — Telehealth: Payer: Self-pay | Admitting: *Deleted

## 2015-01-09 ENCOUNTER — Ambulatory Visit (INDEPENDENT_AMBULATORY_CARE_PROVIDER_SITE_OTHER): Payer: 59 | Admitting: Physician Assistant

## 2015-01-09 ENCOUNTER — Other Ambulatory Visit: Payer: Self-pay | Admitting: Physician Assistant

## 2015-01-09 VITALS — BP 118/72 | HR 68 | Temp 98.8°F | Resp 16 | Ht 63.0 in | Wt 187.4 lb

## 2015-01-09 DIAGNOSIS — Z1231 Encounter for screening mammogram for malignant neoplasm of breast: Secondary | ICD-10-CM

## 2015-01-09 DIAGNOSIS — D72829 Elevated white blood cell count, unspecified: Secondary | ICD-10-CM

## 2015-01-09 DIAGNOSIS — J00 Acute nasopharyngitis [common cold]: Secondary | ICD-10-CM

## 2015-01-09 DIAGNOSIS — B9689 Other specified bacterial agents as the cause of diseases classified elsewhere: Secondary | ICD-10-CM

## 2015-01-09 DIAGNOSIS — J208 Acute bronchitis due to other specified organisms: Secondary | ICD-10-CM

## 2015-01-09 LAB — CBC WITH DIFFERENTIAL/PLATELET
BASOS ABS: 0 10*3/uL (ref 0.0–0.1)
Basophils Relative: 0.2 % (ref 0.0–3.0)
EOS ABS: 0.2 10*3/uL (ref 0.0–0.7)
EOS PCT: 1.5 % (ref 0.0–5.0)
HCT: 40.3 % (ref 36.0–46.0)
HEMOGLOBIN: 13.1 g/dL (ref 12.0–15.0)
LYMPHS ABS: 3.5 10*3/uL (ref 0.7–4.0)
Lymphocytes Relative: 25.4 % (ref 12.0–46.0)
MCHC: 32.5 g/dL (ref 30.0–36.0)
MCV: 87.6 fl (ref 78.0–100.0)
Monocytes Absolute: 0.5 10*3/uL (ref 0.1–1.0)
Monocytes Relative: 3.4 % (ref 3.0–12.0)
NEUTROS PCT: 69.5 % (ref 43.0–77.0)
Neutro Abs: 9.5 10*3/uL — ABNORMAL HIGH (ref 1.4–7.7)
Platelets: 223 10*3/uL (ref 150.0–400.0)
RBC: 4.6 Mil/uL (ref 3.87–5.11)
RDW: 14.8 % (ref 11.5–15.5)
WBC: 13.6 10*3/uL — AB (ref 4.0–10.5)

## 2015-01-09 NOTE — Patient Instructions (Signed)
Please go to the lab for blood work. I will call you with your results.  Please finish your antibiotic. I am glad you are feeling better!

## 2015-01-09 NOTE — Telephone Encounter (Signed)
-----   Message from Brunetta Jeans, PA-C sent at 01/09/2015 12:50 PM EST ----- WBC count is headed back to normal (down from 22.5 to 13.6). Neutrophils (WBC elevated due to bacterial infection) are coming back down to normal. Finish antibiotic as directed. Follow-up 2 weeks for repeat CBC.

## 2015-01-09 NOTE — Telephone Encounter (Signed)
Patient informed, understood & agreed; future lab order placed/SLS  

## 2015-01-09 NOTE — Progress Notes (Signed)
Patient presents to clinic today for follow-up of bronchitis and leukocytosis. Is taking her Levaquin as directed. Endorses improvement with cough and congestion. Denies fever, chills, chest pain. Denies worsening of any symptoms.  Past Medical History  Diagnosis Date  . Schizophrenia ([place])     treated by Dr. Casimiro Needle  . Hypertension   . High cholesterol   . Heart murmur   . Asthma   . Pneumonia ~ 2009  . History of bronchitis     "last time ~ 2009, before I had pneumonia"  . Hypoglycemia   . Migraine     "used to have them often"  . Seizures ([place])     last seizure 3 years ago, not on AED  . Seizure ([place]) 07/07/11  . Stroke ([place]) 07/08/11    "I've had mini strokes before; left side of face  is more down than right "  . Chronic high back pain   . Kidney stone   . Anxiety   . PTSD (post-traumatic stress disorder)   . Double vision   . Hyperparathyroidism ([place])   . Meniscus tear     Right    Current Outpatient Prescriptions on File Prior to Visit  Medication Sig Dispense Refill  . albuterol (PROVENTIL) (2.5 MG/3ML) 0.083% nebulizer solution Take 3 mLs (2.5 mg total) by nebulization every 6 (six) hours as needed for wheezing or shortness of breath. 75 mL 5  . benzonatate (TESSALON) 100 MG capsule Take 1 capsule (100 mg total) by mouth 2 (two) times daily as needed for cough. 30 capsule 0  . cyanocobalamin 500 MCG tablet Take 500 mcg by mouth daily.    . fluticasone (FLONASE) 50 MCG/ACT nasal spray Place 2 sprays into both nostrils daily.    . fluticasone-salmeterol (ADVAIR HFA) 115-21 MCG/ACT inhaler Inhale 1 puff into the lungs 2 (two) times daily. 1 Inhaler 5  . gabapentin (NEURONTIN) 100 MG capsule Take 100 mg by mouth daily.    Marland Kitchen levalbuterol (XOPENEX HFA) 45 MCG/ACT inhaler INHALE 2 PUFFS EVERY 4-6 HOURS AS NEEDED 1 Inhaler 5  . levofloxacin (LEVAQUIN) 500 MG tablet Take 1 tablet (500 mg total) by mouth daily. 7 tablet 0  . LORazepam (ATIVAN) 1 MG tablet Take 2 tablets by  mouth twice a day For anxiety    . losartan (COZAAR) 25 MG tablet Take 1 tablet (25 mg total) by mouth 2 (two) times daily. 60 tablet 3  . magnesium 30 MG tablet Take 30 mg by mouth daily.    . metoprolol (LOPRESSOR) 50 MG tablet Take 50 mg by mouth daily.    . metoprolol tartrate (LOPRESSOR) 25 MG tablet Take 1 tablet (25 mg total) by mouth daily. 30 tablet 2  . omega-3 fish oil (MAXEPA) 1000 MG CAPS capsule Take by mouth.    Marland Kitchen omeprazole (PRILOSEC) 40 MG capsule Take 40 mg by mouth daily.    . orphenadrine (NORFLEX) 100 MG tablet Take 100 mg by mouth 2 (two) times daily as needed.    Marland Kitchen POTASSIUM BICARBONATE PO 367m each.  Pt takes 2 tablets daily.    . promethazine (PHENERGAN) 12.5 MG tablet Take 12.5 mg by mouth every 6 (six) hours as needed.    . rosuvastatin (CRESTOR) 20 MG tablet Take 1 tablet (20 mg total) by mouth every evening. 30 tablet 3  . temazepam (RESTORIL) 15 MG capsule Take 30 mg by mouth at bedtime as needed.    . traMADol (ULTRAM) 50 MG tablet Take 50 mg by  mouth. Every 6-8 hours as needed for pain    . zolpidem (AMBIEN) 10 MG tablet Take 20 mg by mouth at bedtime as needed.     No current facility-administered medications on file prior to visit.    Allergies  Allergen Reactions  . Sulfa Antibiotics Hives  . Metronidazole Nausea And Vomiting  . Acyclovir And Related   . Benadryl [Diphenhydramine Hcl]     Severe emotional reaction and doesn't work well with Pt. Past history  . Cephalosporins Hives  . Codeine Other (See Comments)    Makes patient feel odd ; "sometimes mild; sometimes severe reaction; mostly severe"  . Diclofenac Sodium Nausea And Vomiting  . Nitrofurantoin Monohyd Macro     Felt funny  . Prednisone Other (See Comments)    migraine  . Tizanidine     Other reaction(s): Other (See Comments) insomnia  . Baclofen Nausea Only  . Meloxicam     Other reaction(s): Confusion  . Sulfamethoxazole Rash    Family History  Problem Relation Age of Onset    . Heart attack Father 43    Living  . Leukemia Mother 75    Deceased  . Arthritis Father   . Skin cancer Father   . Hypertension Father   . Hyperlipidemia Father   . Alzheimer's disease Paternal Aunt     X2  . Stomach cancer Paternal Aunt     x1  . Arthritis/Rheumatoid Paternal Aunt     x1  . Neuropathy Brother     Peripheal  . Fibromyalgia Sister   . HIV Sister   . Arthritis/Rheumatoid Sister   . Diabetes Paternal Grandmother     Social History   Social History  . Marital Status: Legally Separated    Spouse Name: N/A  . Number of Children: N/A  . Years of Education: N/A   Social History Main Topics  . Smoking status: Former Smoker -- 1.00 packs/day for 20 years    Types: Cigarettes    Quit date: 02/29/2004  . Smokeless tobacco: Never Used  . Alcohol Use: Yes     Comment: 07/08/11 "glass of wine q once in awhile; not very often; do it on special occasion"  . Drug Use: No  . Sexual Activity: No   Other Topics Concern  . None   Social History Narrative   Completed 2 years of college.  Patient reports living in an apartment by herself, and able to perform all ADL's by herself.  Patient legally separated, 47 y/o son who lives with his father and visits the patient on weekends.  On disability.    Review of Systems - See HPI.  All other ROS are negative.  BP 118/72 mmHg  Pulse 68  Temp(Src) 98.8 F (37.1 C) (Oral)  Resp 16  Ht 5' 3"  (1.6 m)  Wt 187 lb 6 oz (84.993 kg)  BMI 33.20 kg/m2  SpO2 97%  Physical Exam  Constitutional: She is oriented to person, place, and time and well-developed, well-nourished, and in no distress.  HENT:  Head: Normocephalic and atraumatic.  Right Ear: External ear normal.  Left Ear: External ear normal.  Nose: Nose normal.  Mouth/Throat: Oropharynx is clear and moist. No oropharyngeal exudate.  Cardiovascular: Normal rate, regular rhythm, normal heart sounds and intact distal pulses.   Pulmonary/Chest: Effort normal and breath  sounds normal. No respiratory distress. She has no wheezes. She has no rales. She exhibits no tenderness.  Neurological: She is alert and oriented to person, place, and  time.  Skin: Skin is warm and dry. No rash noted.  Psychiatric: Affect normal.  Vitals reviewed.   Recent Results (from the past 2160 hour(s))  CBC     Status: None   Collection Time: 12/05/14  8:54 AM  Result Value Ref Range   WBC 8.6 4.0 - 10.5 K/uL   RBC 4.63 3.87 - 5.11 Mil/uL   Platelets 225.0 150.0 - 400.0 K/uL   Hemoglobin 13.3 12.0 - 15.0 g/dL   HCT 40.2 36.0 - 46.0 %   MCV 86.9 78.0 - 100.0 fl   MCHC 33.1 30.0 - 36.0 g/dL   RDW 14.6 11.5 - 15.5 %  TSH     Status: None   Collection Time: 12/05/14  8:54 AM  Result Value Ref Range   TSH 1.59 0.35 - 4.50 uIU/mL  Comp Met (CMET)     Status: Abnormal   Collection Time: 12/05/14  8:54 AM  Result Value Ref Range   Sodium 142 135 - 145 mEq/L   Potassium 4.2 3.5 - 5.1 mEq/L   Chloride 110 96 - 112 mEq/L   CO2 29 19 - 32 mEq/L   Glucose, Bld 121 (H) 70 - 99 mg/dL   BUN 10 6 - 23 mg/dL   Creatinine, Ser 0.67 0.40 - 1.20 mg/dL   Total Bilirubin 0.3 0.2 - 1.2 mg/dL   Alkaline Phosphatase 59 39 - 117 U/L   AST 18 0 - 37 U/L   ALT 32 0 - 35 U/L   Total Protein 6.2 6.0 - 8.3 g/dL   Albumin 3.8 3.5 - 5.2 g/dL   Calcium 9.8 8.4 - 10.5 mg/dL   GFR 96.19 >60.00 mL/min  INR/PT     Status: Abnormal   Collection Time: 12/09/14  7:42 AM  Result Value Ref Range   INR 1.1 (H) 0.8 - 1.0 ratio   Prothrombin Time 12.3 9.6 - 13.1 sec  Urinalysis, Routine w reflex microscopic (not at [facility])     Status: None   Collection Time: 12/09/14  7:42 AM  Result Value Ref Range   Color, Urine YELLOW Yellow;Lt. Yellow   APPearance CLEAR Clear   Specific Gravity, Urine 1.015 1.000-1.030   pH 6.5 5.0 - 8.0   Total Protein, Urine NEGATIVE Negative   Urine Glucose NEGATIVE Negative   Ketones, ur NEGATIVE Negative   Bilirubin Urine NEGATIVE Negative   Hgb urine dipstick NEGATIVE  Negative   Urobilinogen, UA 0.2 0.0 - 1.0   Leukocytes, UA NEGATIVE Negative   Nitrite NEGATIVE Negative   WBC, UA none seen 0-2/hpf   RBC / HPF none seen 0-2/hpf   Squamous Epithelial / LPF Rare(0-4/hpf) Rare(0-4/hpf)  Urine Culture     Status: None   Collection Time: 12/09/14  7:42 AM  Result Value Ref Range   Colony Count 8,000 COLONIES/ML    Organism ID, Bacteria Insignificant Growth   CBC with Differential/Platelet     Status: Abnormal   Collection Time: 01/04/15  1:45 AM  Result Value Ref Range   WBC 22.5 (H) 4.0 - 10.5 K/uL   RBC 4.79 3.87 - 5.11 MIL/uL   Hemoglobin 13.8 12.0 - 15.0 g/dL   HCT 41.3 36.0 - 46.0 %   MCV 86.2 78.0 - 100.0 fL   MCH 28.8 26.0 - 34.0 pg   MCHC 33.4 30.0 - 36.0 g/dL   RDW 14.0 11.5 - 15.5 %   Platelets 264 150 - 400 K/uL   Neutrophils Relative % 84 %   Neutro Abs  18.9 (H) 1.7 - 7.7 K/uL   Lymphocytes Relative 15 %   Lymphs Abs 3.4 0.7 - 4.0 K/uL   Monocytes Relative 1 %   Monocytes Absolute 0.3 0.1 - 1.0 K/uL   Eosinophils Relative 0 %   Eosinophils Absolute 0.0 0.0 - 0.7 K/uL   Basophils Relative 0 %   Basophils Absolute 0.0 0.0 - 0.1 K/uL   WBC Morphology WHITE COUNT CONFIRMED ON SMEAR    Smear Review PLATELET COUNT CONFIRMED BY SMEAR     Comment: MORPHOLOGY UNREMARKABLE  Comprehensive metabolic panel     Status: Abnormal   Collection Time: 01/04/15  1:45 AM  Result Value Ref Range   Sodium 139 135 - 145 mmol/L   Potassium 3.4 (L) 3.5 - 5.1 mmol/L   Chloride 105 101 - 111 mmol/L   CO2 21 (L) 22 - 32 mmol/L   Glucose, Bld 305 (H) 65 - 99 mg/dL   BUN 16 6 - 20 mg/dL   Creatinine, Ser 0.74 0.44 - 1.00 mg/dL   Calcium 9.9 8.9 - 10.3 mg/dL   Total Protein 7.6 6.5 - 8.1 g/dL   Albumin 4.1 3.5 - 5.0 g/dL   AST 38 15 - 41 U/L   ALT <5 (L) 14 - 54 U/L   Alkaline Phosphatase 68 38 - 126 U/L   Total Bilirubin 0.7 0.3 - 1.2 mg/dL   GFR calc non Af Amer >60 >60 mL/min   GFR calc Af Amer >60 >60 mL/min    Comment: (NOTE) The eGFR has been  calculated using the CKD EPI equation. This calculation has not been validated in all clinical situations. eGFR's persistently <60 mL/min signify possible Chronic Kidney Disease.    Anion gap 13 5 - 15  Brain natriuretic peptide     Status: None   Collection Time: 01/04/15  1:45 AM  Result Value Ref Range   B Natriuretic Peptide 27.1 0.0 - 100.0 pg/mL  Lipase, blood     Status: None   Collection Time: 01/04/15  1:45 AM  Result Value Ref Range   Lipase 37 11 - 51 U/L  Ethanol     Status: None   Collection Time: 01/04/15  1:45 AM  Result Value Ref Range   Alcohol, Ethyl (B) <5 <5 mg/dL    Comment:        LOWEST DETECTABLE LIMIT FOR SERUM ALCOHOL IS 5 mg/dL FOR MEDICAL PURPOSES ONLY   D-dimer, quantitative (not at [facility])     Status: None   Collection Time: 01/04/15  1:45 AM  Result Value Ref Range   D-Dimer, Quant 0.30 0.00 - 0.48 ug/mL-FEU    Comment:        AT THE INHOUSE ESTABLISHED CUTOFF VALUE OF 0.48 ug/mL FEU, THIS ASSAY HAS BEEN DOCUMENTED IN THE LITERATURE TO HAVE A SENSITIVITY AND NEGATIVE PREDICTIVE VALUE OF AT LEAST 98 TO 99%.  THE TEST RESULT SHOULD BE CORRELATED WITH AN ASSESSMENT OF THE CLINICAL PROBABILITY OF DVT / VTE.   Troponin I     Status: None   Collection Time: 01/04/15  1:45 AM  Result Value Ref Range   Troponin I <0.03 <0.031 ng/mL    Comment:        NO INDICATION OF MYOCARDIAL INJURY.   Urinalysis, Routine w reflex microscopic (not at [facility])     Status: Abnormal   Collection Time: 01/04/15  2:30 AM  Result Value Ref Range   Color, Urine YELLOW YELLOW   APPearance CLEAR CLEAR   Specific  Gravity, Urine 1.009 1.005 - 1.030   pH 7.0 5.0 - 8.0   Glucose, UA 250 (A) NEGATIVE mg/dL   Hgb urine dipstick NEGATIVE NEGATIVE   Bilirubin Urine NEGATIVE NEGATIVE   Ketones, ur NEGATIVE NEGATIVE mg/dL   Protein, ur NEGATIVE NEGATIVE mg/dL   Urobilinogen, UA 0.2 0.0 - 1.0 mg/dL   Nitrite NEGATIVE NEGATIVE   Leukocytes, UA NEGATIVE NEGATIVE    Comment:  MICROSCOPIC NOT DONE ON URINES WITH NEGATIVE PROTEIN, BLOOD, LEUKOCYTES, NITRITE, OR GLUCOSE <1000 mg/dL.  Urine rapid drug screen (hosp performed)     Status: Abnormal   Collection Time: 01/04/15  2:30 AM  Result Value Ref Range   Opiates NONE DETECTED NONE DETECTED   Cocaine NONE DETECTED NONE DETECTED   Benzodiazepines POSITIVE (A) NONE DETECTED   Amphetamines NONE DETECTED NONE DETECTED   Tetrahydrocannabinol NONE DETECTED NONE DETECTED   Barbiturates NONE DETECTED NONE DETECTED    Comment:        DRUG SCREEN FOR MEDICAL PURPOSES ONLY.  IF CONFIRMATION IS NEEDED FOR ANY PURPOSE, NOTIFY LAB WITHIN 5 DAYS.        LOWEST DETECTABLE LIMITS FOR URINE DRUG SCREEN Drug Class       Cutoff (ng/mL) Amphetamine      1000 Barbiturate      200 Benzodiazepine   825 Tricyclics       189 Opiates          300 Cocaine          300 THC              50   Troponin I     Status: None   Collection Time: 01/04/15  5:26 AM  Result Value Ref Range   Troponin I <0.03 <0.031 ng/mL    Comment:        NO INDICATION OF MYOCARDIAL INJURY.     Assessment/Plan: Acute bacterial bronchitis Improving. Continue current regimen. Will repeat CBC to ensure leukocytosis is resolving.

## 2015-01-09 NOTE — Assessment & Plan Note (Signed)
Improving. Continue current regimen. Will repeat CBC to ensure leukocytosis is resolving.

## 2015-01-09 NOTE — Progress Notes (Signed)
Pre visit review using our clinic review tool, if applicable. No additional management support is needed unless otherwise documented below in the visit note/SLS  

## 2015-01-12 ENCOUNTER — Telehealth: Payer: Self-pay | Admitting: Physician Assistant

## 2015-01-12 NOTE — Telephone Encounter (Signed)
Left message for pt to call back if she has any questions. 

## 2015-01-12 NOTE — Telephone Encounter (Signed)
Caller name: Elorah  Relationship to patient: Self  Can be reached: [phone]  Reason for call: Pt left vm requesting her lab results. Please advise further.    Thanks.

## 2015-01-15 ENCOUNTER — Ambulatory Visit (HOSPITAL_BASED_OUTPATIENT_CLINIC_OR_DEPARTMENT_OTHER)
Admission: RE | Admit: 2015-01-15 | Discharge: 2015-01-15 | Disposition: A | Discharge status: Home / Self Care | Payer: 59 | Source: Ambulatory Visit | Attending: Physician Assistant | Admitting: Physician Assistant

## 2015-01-15 DIAGNOSIS — Z1231 Encounter for screening mammogram for malignant neoplasm of breast: Secondary | ICD-10-CM | POA: Insufficient documentation

## 2015-01-16 ENCOUNTER — Ambulatory Visit (HOSPITAL_BASED_OUTPATIENT_CLINIC_OR_DEPARTMENT_OTHER): Admission: RE | Admit: 2015-01-16 | Payer: 59 | Source: Ambulatory Visit

## 2015-01-26 ENCOUNTER — Ambulatory Visit: Payer: 59 | Admitting: Neurology

## 2015-01-29 ENCOUNTER — Telehealth: Payer: Self-pay | Admitting: Physician Assistant

## 2015-01-29 ENCOUNTER — Encounter: Payer: Self-pay | Admitting: Medical

## 2015-01-29 ENCOUNTER — Ambulatory Visit (INDEPENDENT_AMBULATORY_CARE_PROVIDER_SITE_OTHER): Payer: 59 | Admitting: Medical

## 2015-01-29 VITALS — BP 118/78 | HR 71 | Temp 98.2°F | Ht 63.0 in | Wt 189.0 lb

## 2015-01-29 DIAGNOSIS — M791 Myalgia, unspecified site: Secondary | ICD-10-CM

## 2015-01-29 DIAGNOSIS — M797 Fibromyalgia: Secondary | ICD-10-CM | POA: Diagnosis not present

## 2015-01-29 MED ORDER — TRAMADOL HCL 50 MG PO TABS: 50.0000 mg | ORAL_TABLET | Freq: Three times a day (TID) | ORAL | Status: DC | PRN

## 2015-01-29 MED ORDER — GABAPENTIN 100 MG PO CAPS: 100.0000 mg | ORAL_CAPSULE | Freq: Three times a day (TID) | ORAL | Status: DC

## 2015-01-29 NOTE — Telephone Encounter (Signed)
Caller name: Self   Can be reached: [phone]  Pharmacy:  [facility] [place], [place] [phone] (Phone) [phone] (Fax)         Reason for call: Patient states prescription for Tramadol was not sent to pharmacy.

## 2015-01-29 NOTE — Addendum Note (Signed)
Addended by: Anabel Halon on: 01/29/2015 03:06 PM   Modules accepted: Orders

## 2015-01-29 NOTE — Patient Instructions (Addendum)
For fibromyalgia rx gabapentin 100 mg 3 times a day. I had offered cymbalta but declined.  Muscle soreness that may be related to PT therapy. But no nsaid rx due to allergy hx.  I am refilling your tramadol Will defer any further refills to pcp.(pt states prior use and no reaction)  Follow up in one month or as needed

## 2015-01-29 NOTE — Telephone Encounter (Signed)
I did not see the patient. Edward did. Will forward to his nurse.  Barnet Pall please see below.

## 2015-01-29 NOTE — Progress Notes (Signed)
Pre visit review using our clinic review tool, if applicable. No additional management support is needed unless otherwise documented below in the visit note. 

## 2015-01-29 NOTE — Progress Notes (Signed)
Subjective:    Patient ID: Jasmine [name], female    DOB: [birth year], 57 y.o.   MRN: [id number]  HPI  Pt in some pain in her trapezius region, lower back and arms. Pt states history of fibromyalgia. She thinks this may be acting up. Pt states history of pain in these areas before intrermittently for a year. Pt recently was doing PT of hip and knee pain. She was at PT doing exercises with weight around her legs and these above area became painful.  Pt in past had some arthritis studies done and was told not arthritis.   Pt ran out of gabapentin 2 days. Pt has never been on cymbalta. Pt told a lot of side effects reported from persons that she knows.   Pt had old rx  Gabapentin from prior provider. Instruction were to eventually increase to 100 mg tid. But she was only taking one tab a day.  Pt request refill of tramadol      Review of Systems  Constitutional: Negative for fever, chills, diaphoresis, activity change and fatigue.  Respiratory: Negative for cough, chest tightness and shortness of breath.   Cardiovascular: Negative for chest pain, palpitations and leg swelling.  Gastrointestinal: Negative for nausea, vomiting and abdominal pain.  Musculoskeletal: Negative for neck pain and neck stiffness.       See hpi.   Neurological: Negative for dizziness, weakness and headaches.  Psychiatric/Behavioral: Negative for behavioral problems, confusion and agitation. The patient is not nervous/anxious.     Past Medical History  Diagnosis Date  . Schizophrenia ([place])     treated by Dr. Casimiro Needle  . Hypertension   . High cholesterol   . Heart murmur   . Asthma   . Pneumonia ~ 2009  . History of bronchitis     "last time ~ 2009, before I had pneumonia"  . Hypoglycemia   . Migraine     "used to have them often"  . Seizures ([place])     last seizure 3 years ago, not on AED  . Seizure ([place]) 07/07/11  . Stroke ([place]) 07/08/11    "I've had mini strokes before; left side of face  is more down  than right "  . Chronic high back pain   . Kidney stone   . Anxiety   . PTSD (post-traumatic stress disorder)   . Double vision   . Hyperparathyroidism ([place])   . Meniscus tear     Right    Social History   Social History  . Marital Status: Legally Separated    Spouse Name: N/A  . Number of Children: N/A  . Years of Education: N/A   Occupational History  . Not on file.   Social History Main Topics  . Smoking status: Former Smoker -- 1.00 packs/day for 20 years    Types: Cigarettes    Quit date: 02/29/2004  . Smokeless tobacco: Never Used  . Alcohol Use: Yes     Comment: 07/08/11 "glass of wine q once in awhile; not very often; do it on special occasion"  . Drug Use: No  . Sexual Activity: No   Other Topics Concern  . Not on file   Social History Narrative   Completed 2 years of college.  Patient reports living in an apartment by herself, and able to perform all ADL's by herself.  Patient legally separated, 64 y/o son who lives with his father and visits the patient on weekends.  On disability.    Past Surgical  History  Procedure Laterality Date  . Cochlear implant  2010    left  . Tonsillectomy      "as a child"  . Inguinal hernia repair  1985    left  . Kidney stone surgery  ~ 2006  . Wisdom tooth extraction      Family History  Problem Relation Age of Onset  . Heart attack Father 58    Living  . Leukemia Mother 15    Deceased  . Arthritis Father   . Skin cancer Father   . Hypertension Father   . Hyperlipidemia Father   . Alzheimer's disease Paternal Aunt     X2  . Stomach cancer Paternal Aunt     x1  . Arthritis/Rheumatoid Paternal Aunt     x1  . Neuropathy Brother     Peripheal  . Fibromyalgia Sister   . HIV Sister   . Arthritis/Rheumatoid Sister   . Diabetes Paternal Grandmother     Allergies  Allergen Reactions  . Sulfa Antibiotics Hives  . Metronidazole Nausea And Vomiting  . Acyclovir And Related   . Benadryl [Diphenhydramine Hcl]       Severe emotional reaction and doesn't work well with Pt. Past history  . Cephalosporins Hives  . Codeine Other (See Comments)    Makes patient feel odd ; "sometimes mild; sometimes severe reaction; mostly severe"  . Diclofenac Sodium Nausea And Vomiting  . Nitrofurantoin Monohyd Macro     Felt funny  . Prednisone Other (See Comments)    migraine  . Tizanidine     Other reaction(s): Other (See Comments) insomnia  . Baclofen Nausea Only  . Meloxicam     Other reaction(s): Confusion  . Sulfamethoxazole Rash    Current Outpatient Prescriptions on File Prior to Visit  Medication Sig Dispense Refill  . albuterol (PROVENTIL) (2.5 MG/3ML) 0.083% nebulizer solution Take 3 mLs (2.5 mg total) by nebulization every 6 (six) hours as needed for wheezing or shortness of breath. 75 mL 5  . calcitRIOL (ROCALTROL) 0.5 MCG capsule Take 0.5 mcg by mouth 2 (two) times daily. For 30 days    . fluticasone (FLONASE) 50 MCG/ACT nasal spray Place 2 sprays into both nostrils daily.    . fluticasone-salmeterol (ADVAIR HFA) 115-21 MCG/ACT inhaler Inhale 1 puff into the lungs 2 (two) times daily. 1 Inhaler 5  . gabapentin (NEURONTIN) 100 MG capsule Take 100 mg by mouth daily.    Marland Kitchen levalbuterol (XOPENEX HFA) 45 MCG/ACT inhaler INHALE 2 PUFFS EVERY 4-6 HOURS AS NEEDED 1 Inhaler 5  . levofloxacin (LEVAQUIN) 500 MG tablet Take 1 tablet (500 mg total) by mouth daily. 7 tablet 0  . LORazepam (ATIVAN) 1 MG tablet Take 2 tablets by mouth twice a day For anxiety    . losartan (COZAAR) 25 MG tablet Take 1 tablet (25 mg total) by mouth 2 (two) times daily. 60 tablet 3  . magnesium 30 MG tablet Take 30 mg by mouth daily.    . metoprolol (LOPRESSOR) 50 MG tablet Take 50 mg by mouth daily.    . metoprolol tartrate (LOPRESSOR) 25 MG tablet Take 1 tablet (25 mg total) by mouth daily. 30 tablet 2  . omega-3 fish oil (MAXEPA) 1000 MG CAPS capsule Take by mouth.    . orphenadrine (NORFLEX) 100 MG tablet Take 100 mg by mouth 2  (two) times daily as needed.    Marland Kitchen POTASSIUM BICARBONATE PO 395mg  each.  Pt takes 2 tablets daily.    . promethazine (  PHENERGAN) 12.5 MG tablet Take 12.5 mg by mouth every 6 (six) hours as needed.    . rosuvastatin (CRESTOR) 20 MG tablet Take 1 tablet (20 mg total) by mouth every evening. 30 tablet 3  . temazepam (RESTORIL) 15 MG capsule Take 30 mg by mouth at bedtime as needed.    . zolpidem (AMBIEN) 10 MG tablet Take 20 mg by mouth at bedtime as needed.    . traMADol (ULTRAM) 50 MG tablet Take 50 mg by mouth. Every 6-8 hours as needed for pain     No current facility-administered medications on file prior to visit.    BP 118/78 mmHg  Pulse 71  Temp(Src) 98.2 F (36.8 C) (Oral)  Ht 5\' 3"  (1.6 m)  Wt 189 lb (85.73 kg)  BMI 33.49 kg/m2  SpO2 97%       Objective:   Physical Exam  General Appearance- Not in acute distress.    Chest and Lung Exam Auscultation: Breath sounds:-Normal. Clear even and unlabored. Adventitious sounds:- No Adventitious sounds.  Cardiovascular Auscultation:Rythm - Regular, rate and rythm. Heart Sounds -Normal heart sounds.  Abdomen Inspection:-Inspection Normal.  Palpation/Perucssion: Palpation and Percussion of the abdomen reveal- Non Tender, No Rebound tenderness, No rigidity(Guarding) and No Palpable abdominal masses.  Liver:-Normal.  Spleen:- Normal.   Back  Upper back bilateral trapezius tendernss Mid faint  lumbar spine tenderness to palpation. Pain on straight leg lift. Pain on lateral movements and flexion/extension of the spine.  Lower ext neurologic  L5-S1 sensation intact bilaterally. Normal patellar reflexes bilaterally. No foot drop bilaterally.  Hips and knees- bilateral pain on ROM. No crepitus.  Elbows- bilateral pain on flexion and extension.        Assessment & Plan:  For fibromyalgia rx gabapentin 100 mg 3 times a day. I had offered cymbalta but declined.  Muscle soreness that may be related to PT therapy. But  no nsaid rx due to allergy hx.  I am refilling your tramadol Will defer any further refills to pcp.  Follow up in one month or as needed  No work up done today since she states had work up in past and told condition not arthritis related.

## 2015-01-29 NOTE — Telephone Encounter (Signed)
Pt left without her avs or her tramadol rx. Will ou try to call her and advise on her script to pick up in our office.

## 2015-01-30 NOTE — Telephone Encounter (Signed)
Patient has been notified that Rx will be sent to pharmacy.

## 2015-02-09 ENCOUNTER — Telehealth: Payer: Self-pay | Admitting: Physician Assistant

## 2015-02-09 NOTE — Telephone Encounter (Signed)
Pt states that Jasmine [name] told her 01/29/15 that he was calling in anti-inflammatory but pharmacy does not have it. Please send in or call pt  Pharmacy: [facility] [place], [place] - [phone]

## 2015-02-10 MED ORDER — TRAMADOL HCL 50 MG PO TABS: 50.0000 mg | ORAL_TABLET | Freq: Three times a day (TID) | ORAL | Status: DC | PRN

## 2015-02-10 NOTE — Telephone Encounter (Signed)
Left message for pt to call back  °

## 2015-02-10 NOTE — Telephone Encounter (Signed)
Spoke with Jasmine [name] and per ES Verbal Order to give Jasmine [name] 15 tramadol with no refills and advise Jasmine [name] to follow up with PCP in the next two weeks. Patient voices understanding.

## 2015-02-18 ENCOUNTER — Ambulatory Visit: Payer: Self-pay | Admitting: Neurology

## 2015-02-18 ENCOUNTER — Telehealth: Payer: Self-pay

## 2015-02-18 NOTE — Telephone Encounter (Signed)
Patient left message with answering service the night before her appointment to cancel new patient appointment for today.

## 2015-03-04 ENCOUNTER — Telehealth: Payer: Self-pay | Admitting: Physician Assistant

## 2015-03-04 ENCOUNTER — Encounter: Payer: Self-pay | Admitting: Physician Assistant

## 2015-03-04 ENCOUNTER — Ambulatory Visit (INDEPENDENT_AMBULATORY_CARE_PROVIDER_SITE_OTHER): Payer: 59 | Admitting: Physician Assistant

## 2015-03-04 VITALS — BP 140/80 | HR 113 | Temp 98.3°F | Ht 63.0 in | Wt 193.6 lb

## 2015-03-04 DIAGNOSIS — M791 Myalgia: Secondary | ICD-10-CM

## 2015-03-04 DIAGNOSIS — IMO0001 Reserved for inherently not codable concepts without codable children: Secondary | ICD-10-CM

## 2015-03-04 DIAGNOSIS — M609 Myositis, unspecified: Secondary | ICD-10-CM | POA: Diagnosis not present

## 2015-03-04 MED ORDER — GABAPENTIN 100 MG PO CAPS: ORAL_CAPSULE | ORAL | Status: DC

## 2015-03-04 NOTE — Progress Notes (Signed)
Pre visit review using our clinic review tool, if applicable. No additional management support is needed unless otherwise documented below in the visit note. 

## 2015-03-04 NOTE — Assessment & Plan Note (Signed)
Increased Gabapentin to 100 mg AM, Noon and 300 mg each evening. Discussed benefit of adding Cymbalta but patient declines at present. Follow-up 1 month.

## 2015-03-04 NOTE — Patient Instructions (Signed)
Please increase the nighttime Gabapentin to 3 capsules (300 mg). Continue the same morning and midday dose. Follow-up in 1 month. Return sooner if anything worsens.

## 2015-03-04 NOTE — Telephone Encounter (Signed)
Referral placed.//AB/CMA 

## 2015-03-04 NOTE — Progress Notes (Signed)
Patient presents to clinic today for follow-up of myalgia and myositis after Gabapentin increased to 100 mg TID. Endorses some improvement in pain without side effects. Is moving around better but still has some myalgias. Is resting better.   Past Medical History  Diagnosis Date  . Schizophrenia ([place])     treated by Dr. Casimiro Needle  . Hypertension   . High cholesterol   . Heart murmur   . Asthma   . Pneumonia ~ 2009  . History of bronchitis     "last time ~ 2009, before I had pneumonia"  . Hypoglycemia   . Migraine     "used to have them often"  . Seizures ([place])     last seizure 3 years ago, not on AED  . Seizure ([place]) 07/07/11  . Stroke ([place]) 07/08/11    "I've had mini strokes before; left side of face  is more down than right "  . Chronic high back pain   . Kidney stone   . Anxiety   . PTSD (post-traumatic stress disorder)   . Double vision   . Hyperparathyroidism ([place])   . Meniscus tear     Right    Current Outpatient Prescriptions on File Prior to Visit  Medication Sig Dispense Refill  . albuterol (PROVENTIL) (2.5 MG/3ML) 0.083% nebulizer solution Take 3 mLs (2.5 mg total) by nebulization every 6 (six) hours as needed for wheezing or shortness of breath. 75 mL 5  . fluticasone (FLONASE) 50 MCG/ACT nasal spray Place 2 sprays into both nostrils daily.    . fluticasone-salmeterol (ADVAIR HFA) 115-21 MCG/ACT inhaler Inhale 1 puff into the lungs 2 (two) times daily. 1 Inhaler 5  . levalbuterol (XOPENEX HFA) 45 MCG/ACT inhaler INHALE 2 PUFFS EVERY 4-6 HOURS AS NEEDED 1 Inhaler 5  . LORazepam (ATIVAN) 1 MG tablet Take 2 tablets by mouth twice a day For anxiety    . losartan (COZAAR) 25 MG tablet Take 1 tablet (25 mg total) by mouth 2 (two) times daily. 60 tablet 3  . metoprolol (LOPRESSOR) 50 MG tablet Take 50 mg by mouth daily.    . metoprolol tartrate (LOPRESSOR) 25 MG tablet Take 1 tablet (25 mg total) by mouth daily. 30 tablet 2  . orphenadrine (NORFLEX) 100 MG tablet Take  100 mg by mouth 2 (two) times daily as needed.    Marland Kitchen POTASSIUM BICARBONATE PO 364m each.  Pt takes 2 tablets daily.    . rosuvastatin (CRESTOR) 20 MG tablet Take 1 tablet (20 mg total) by mouth every evening. 30 tablet 3  . temazepam (RESTORIL) 15 MG capsule Take 30 mg by mouth at bedtime as needed.    . zolpidem (AMBIEN) 10 MG tablet Take 20 mg by mouth at bedtime as needed.    . calcitRIOL (ROCALTROL) 0.5 MCG capsule Take 0.5 mcg by mouth 2 (two) times daily. Reported on 03/04/2015    . magnesium 30 MG tablet Take 30 mg by mouth daily. Reported on 03/04/2015    . omega-3 fish oil (MAXEPA) 1000 MG CAPS capsule Take by mouth. Reported on 03/04/2015    . promethazine (PHENERGAN) 12.5 MG tablet Take 12.5 mg by mouth every 6 (six) hours as needed. Reported on 03/04/2015     No current facility-administered medications on file prior to visit.    Allergies  Allergen Reactions  . Sulfa Antibiotics Hives  . Metronidazole Nausea And Vomiting  . Acyclovir And Related   . Benadryl [Diphenhydramine Hcl]     Severe  emotional reaction and doesn't work well with Pt. Past history  . Cephalosporins Hives  . Codeine Other (See Comments)    Makes patient feel odd ; "sometimes mild; sometimes severe reaction; mostly severe"  . Diclofenac Sodium Nausea And Vomiting  . Nitrofurantoin Monohyd Macro     Felt funny  . Prednisone Other (See Comments)    migraine  . Tizanidine     Other reaction(s): Other (See Comments) insomnia  . Baclofen Nausea Only  . Meloxicam     Other reaction(s): Confusion  . Sulfamethoxazole Rash    Family History  Problem Relation Age of Onset  . Heart attack Father 52    Living  . Leukemia Mother 62    Deceased  . Arthritis Father   . Skin cancer Father   . Hypertension Father   . Hyperlipidemia Father   . Alzheimer's disease Paternal Aunt     X2  . Stomach cancer Paternal Aunt     x1  . Arthritis/Rheumatoid Paternal Aunt     x1  . Neuropathy Brother     Peripheal  .  Fibromyalgia Sister   . HIV Sister   . Arthritis/Rheumatoid Sister   . Diabetes Paternal Grandmother     Social History   Social History  . Marital Status: Legally Separated    Spouse Name: N/A  . Number of Children: N/A  . Years of Education: N/A   Social History Main Topics  . Smoking status: Former Smoker -- 1.00 packs/day for 20 years    Types: Cigarettes    Quit date: 02/29/2004  . Smokeless tobacco: Never Used  . Alcohol Use: Yes     Comment: 07/08/11 "glass of wine q once in awhile; not very often; do it on special occasion"  . Drug Use: No  . Sexual Activity: No   Other Topics Concern  . None   Social History Narrative   Completed 2 years of college.  Patient reports living in an apartment by herself, and able to perform all ADL's by herself.  Patient legally separated, 23 y/o son who lives with his father and visits the patient on weekends.  On disability.    Review of Systems - See HPI.  All other ROS are negative.  BP 140/80 mmHg  Pulse 113  Temp(Src) 98.3 F (36.8 C) (Oral)  Ht 5' 3" (1.6 m)  Wt 193 lb 9.6 oz (87.816 kg)  BMI 34.30 kg/m2  SpO2 90%  Physical Exam  Constitutional: She is oriented to person, place, and time and well-developed, well-nourished, and in no distress.  HENT:  Head: Normocephalic and atraumatic.  Cardiovascular: Normal rate, regular rhythm, normal heart sounds and intact distal pulses.   Pulmonary/Chest: Effort normal and breath sounds normal. No respiratory distress. She has no wheezes. She has no rales. She exhibits no tenderness.  Neurological: She is alert and oriented to person, place, and time.  Vitals reviewed.   Recent Results (from the past 2160 hour(s))  CBC     Status: None   Collection Time: 12/05/14  8:54 AM  Result Value Ref Range   WBC 8.6 4.0 - 10.5 K/uL   RBC 4.63 3.87 - 5.11 Mil/uL   Platelets 225.0 150.0 - 400.0 K/uL   Hemoglobin 13.3 12.0 - 15.0 g/dL   HCT 40.2 36.0 - 46.0 %   MCV 86.9 78.0 - 100.0 fl     MCHC 33.1 30.0 - 36.0 g/dL   RDW 14.6 11.5 - 15.5 %  TSH  Status: None   Collection Time: 12/05/14  8:54 AM  Result Value Ref Range   TSH 1.59 0.35 - 4.50 uIU/mL  Comp Met (CMET)     Status: Abnormal   Collection Time: 12/05/14  8:54 AM  Result Value Ref Range   Sodium 142 135 - 145 mEq/L   Potassium 4.2 3.5 - 5.1 mEq/L   Chloride 110 96 - 112 mEq/L   CO2 29 19 - 32 mEq/L   Glucose, Bld 121 (H) 70 - 99 mg/dL   BUN 10 6 - 23 mg/dL   Creatinine, Ser 0.67 0.40 - 1.20 mg/dL   Total Bilirubin 0.3 0.2 - 1.2 mg/dL   Alkaline Phosphatase 59 39 - 117 U/L   AST 18 0 - 37 U/L   ALT 32 0 - 35 U/L   Total Protein 6.2 6.0 - 8.3 g/dL   Albumin 3.8 3.5 - 5.2 g/dL   Calcium 9.8 8.4 - 10.5 mg/dL   GFR 96.19 >60.00 mL/min  INR/PT     Status: Abnormal   Collection Time: 12/09/14  7:42 AM  Result Value Ref Range   INR 1.1 (H) 0.8 - 1.0 ratio   Prothrombin Time 12.3 9.6 - 13.1 sec  Urinalysis, Routine w reflex microscopic (not at [facility])     Status: None   Collection Time: 12/09/14  7:42 AM  Result Value Ref Range   Color, Urine YELLOW Yellow;Lt. Yellow   APPearance CLEAR Clear   Specific Gravity, Urine 1.015 1.000-1.030   pH 6.5 5.0 - 8.0   Total Protein, Urine NEGATIVE Negative   Urine Glucose NEGATIVE Negative   Ketones, ur NEGATIVE Negative   Bilirubin Urine NEGATIVE Negative   Hgb urine dipstick NEGATIVE Negative   Urobilinogen, UA 0.2 0.0 - 1.0   Leukocytes, UA NEGATIVE Negative   Nitrite NEGATIVE Negative   WBC, UA none seen 0-2/hpf   RBC / HPF none seen 0-2/hpf   Squamous Epithelial / LPF Rare(0-4/hpf) Rare(0-4/hpf)  Urine Culture     Status: None   Collection Time: 12/09/14  7:42 AM  Result Value Ref Range   Colony Count 8,000 COLONIES/ML    Organism ID, Bacteria Insignificant Growth   CBC with Differential/Platelet     Status: Abnormal   Collection Time: 01/04/15  1:45 AM  Result Value Ref Range   WBC 22.5 (H) 4.0 - 10.5 K/uL   RBC 4.79 3.87 - 5.11 MIL/uL   Hemoglobin  13.8 12.0 - 15.0 g/dL   HCT 41.3 36.0 - 46.0 %   MCV 86.2 78.0 - 100.0 fL   MCH 28.8 26.0 - 34.0 pg   MCHC 33.4 30.0 - 36.0 g/dL   RDW 14.0 11.5 - 15.5 %   Platelets 264 150 - 400 K/uL   Neutrophils Relative % 84 %   Neutro Abs 18.9 (H) 1.7 - 7.7 K/uL   Lymphocytes Relative 15 %   Lymphs Abs 3.4 0.7 - 4.0 K/uL   Monocytes Relative 1 %   Monocytes Absolute 0.3 0.1 - 1.0 K/uL   Eosinophils Relative 0 %   Eosinophils Absolute 0.0 0.0 - 0.7 K/uL   Basophils Relative 0 %   Basophils Absolute 0.0 0.0 - 0.1 K/uL   WBC Morphology WHITE COUNT CONFIRMED ON SMEAR    Smear Review PLATELET COUNT CONFIRMED BY SMEAR     Comment: MORPHOLOGY UNREMARKABLE  Comprehensive metabolic panel     Status: Abnormal   Collection Time: 01/04/15  1:45 AM  Result Value Ref Range   Sodium  139 135 - 145 mmol/L   Potassium 3.4 (L) 3.5 - 5.1 mmol/L   Chloride 105 101 - 111 mmol/L   CO2 21 (L) 22 - 32 mmol/L   Glucose, Bld 305 (H) 65 - 99 mg/dL   BUN 16 6 - 20 mg/dL   Creatinine, Ser 0.74 0.44 - 1.00 mg/dL   Calcium 9.9 8.9 - 10.3 mg/dL   Total Protein 7.6 6.5 - 8.1 g/dL   Albumin 4.1 3.5 - 5.0 g/dL   AST 38 15 - 41 U/L   ALT <5 (L) 14 - 54 U/L   Alkaline Phosphatase 68 38 - 126 U/L   Total Bilirubin 0.7 0.3 - 1.2 mg/dL   GFR calc non Af Amer >60 >60 mL/min   GFR calc Af Amer >60 >60 mL/min    Comment: (NOTE) The eGFR has been calculated using the CKD EPI equation. This calculation has not been validated in all clinical situations. eGFR's persistently <60 mL/min signify possible Chronic Kidney Disease.    Anion gap 13 5 - 15  Brain natriuretic peptide     Status: None   Collection Time: 01/04/15  1:45 AM  Result Value Ref Range   B Natriuretic Peptide 27.1 0.0 - 100.0 pg/mL  Lipase, blood     Status: None   Collection Time: 01/04/15  1:45 AM  Result Value Ref Range   Lipase 37 11 - 51 U/L  Ethanol     Status: None   Collection Time: 01/04/15  1:45 AM  Result Value Ref Range   Alcohol, Ethyl (B)  <5 <5 mg/dL    Comment:        LOWEST DETECTABLE LIMIT FOR SERUM ALCOHOL IS 5 mg/dL FOR MEDICAL PURPOSES ONLY   D-dimer, quantitative (not at [facility])     Status: None   Collection Time: 01/04/15  1:45 AM  Result Value Ref Range   D-Dimer, Quant 0.30 0.00 - 0.48 ug/mL-FEU    Comment:        AT THE INHOUSE ESTABLISHED CUTOFF VALUE OF 0.48 ug/mL FEU, THIS ASSAY HAS BEEN DOCUMENTED IN THE LITERATURE TO HAVE A SENSITIVITY AND NEGATIVE PREDICTIVE VALUE OF AT LEAST 98 TO 99%.  THE TEST RESULT SHOULD BE CORRELATED WITH AN ASSESSMENT OF THE CLINICAL PROBABILITY OF DVT / VTE.   Troponin I     Status: None   Collection Time: 01/04/15  1:45 AM  Result Value Ref Range   Troponin I <0.03 <0.031 ng/mL    Comment:        NO INDICATION OF MYOCARDIAL INJURY.   Urinalysis, Routine w reflex microscopic (not at [facility])     Status: Abnormal   Collection Time: 01/04/15  2:30 AM  Result Value Ref Range   Color, Urine YELLOW YELLOW   APPearance CLEAR CLEAR   Specific Gravity, Urine 1.009 1.005 - 1.030   pH 7.0 5.0 - 8.0   Glucose, UA 250 (A) NEGATIVE mg/dL   Hgb urine dipstick NEGATIVE NEGATIVE   Bilirubin Urine NEGATIVE NEGATIVE   Ketones, ur NEGATIVE NEGATIVE mg/dL   Protein, ur NEGATIVE NEGATIVE mg/dL   Urobilinogen, UA 0.2 0.0 - 1.0 mg/dL   Nitrite NEGATIVE NEGATIVE   Leukocytes, UA NEGATIVE NEGATIVE    Comment: MICROSCOPIC NOT DONE ON URINES WITH NEGATIVE PROTEIN, BLOOD, LEUKOCYTES, NITRITE, OR GLUCOSE <1000 mg/dL.  Urine rapid drug screen (hosp performed)     Status: Abnormal   Collection Time: 01/04/15  2:30 AM  Result Value Ref Range   Opiates NONE DETECTED  NONE DETECTED   Cocaine NONE DETECTED NONE DETECTED   Benzodiazepines POSITIVE (A) NONE DETECTED   Amphetamines NONE DETECTED NONE DETECTED   Tetrahydrocannabinol NONE DETECTED NONE DETECTED   Barbiturates NONE DETECTED NONE DETECTED    Comment:        DRUG SCREEN FOR MEDICAL PURPOSES ONLY.  IF CONFIRMATION IS NEEDED FOR ANY  PURPOSE, NOTIFY LAB WITHIN 5 DAYS.        LOWEST DETECTABLE LIMITS FOR URINE DRUG SCREEN Drug Class       Cutoff (ng/mL) Amphetamine      1000 Barbiturate      200 Benzodiazepine   540 Tricyclics       981 Opiates          300 Cocaine          300 THC              50   Troponin I     Status: None   Collection Time: 01/04/15  5:26 AM  Result Value Ref Range   Troponin I <0.03 <0.031 ng/mL    Comment:        NO INDICATION OF MYOCARDIAL INJURY.   CBC w/Diff     Status: Abnormal   Collection Time: 01/09/15 10:22 AM  Result Value Ref Range   WBC 13.6 (H) 4.0 - 10.5 K/uL   RBC 4.60 3.87 - 5.11 Mil/uL   Hemoglobin 13.1 12.0 - 15.0 g/dL   HCT 40.3 36.0 - 46.0 %   MCV 87.6 78.0 - 100.0 fl   MCHC 32.5 30.0 - 36.0 g/dL   RDW 14.8 11.5 - 15.5 %   Platelets 223.0 150.0 - 400.0 K/uL   Neutrophils Relative % 69.5 43.0 - 77.0 %   Lymphocytes Relative 25.4 12.0 - 46.0 %   Monocytes Relative 3.4 3.0 - 12.0 %   Eosinophils Relative 1.5 0.0 - 5.0 %   Basophils Relative 0.2 0.0 - 3.0 %   Neutro Abs 9.5 (H) 1.4 - 7.7 K/uL   Lymphs Abs 3.5 0.7 - 4.0 K/uL   Monocytes Absolute 0.5 0.1 - 1.0 K/uL   Eosinophils Absolute 0.2 0.0 - 0.7 K/uL   Basophils Absolute 0.0 0.0 - 0.1 K/uL    Assessment/Plan: Myalgia and myositis Increased Gabapentin to 100 mg AM, Noon and 300 mg each evening. Discussed benefit of adding Cymbalta but patient declines at present. Follow-up 1 month.

## 2015-03-04 NOTE — Telephone Encounter (Signed)
We can definitely set that up. Ok to place order.

## 2015-03-04 NOTE — Telephone Encounter (Signed)
°  Relation to OX:[id number] Call back number:[phone] Pharmacy:  Reason for call: pt states she would like for you to call her, states she would like to know if [name] can place an order for her to get physical therapy to help with the fibromalgia.

## 2015-03-05 ENCOUNTER — Telehealth: Payer: Self-pay | Admitting: Physician Assistant

## 2015-03-05 NOTE — Telephone Encounter (Signed)
Can start Elavil 15 mg at bedtime for her chronic pains. Will take likely a week to take full effect.  She has many allergies which makes a strictly pain medication difficult to prescribe. Can do a temporary regimen on Tramadol if she is willing. Seems Jasmine [name] gave her a one-time Rx for this. Make sure she tolerates this.  If willing, ok to send in Rx Elavil 15 mg Quantity 30 with 1 refill. Also ok to phone in Tramadol 50 mg -- 1 tablet by mouth every 12 hours as needed. Quantity 60 with 0 refills. Have her follow-up with me in 3 weeks.

## 2015-03-05 NOTE — Telephone Encounter (Signed)
Please update her immunizations. Regarding the gabapentin -- she was seen yesterday for a visit and noted the gabapentin was helping without any side effects. We increased her evening dose so I am assuming that is the culprit. Have her go back down to 1 capsule three times daily. She needs to reconsider [place] starting another medication like Cymbalta or Savella to help with the pain.

## 2015-03-05 NOTE — Telephone Encounter (Signed)
Caller name: Bebe   Relationship to patient: Self  Can be reached: [phone]  Reason for call: pt says that she had her pneumonia shot at the same time as her flu vac. She says that she would like to have her information updated. ALSO, she states that she was prescribed gabapentin, she says that it isn't helping. Her feet are swelling. She says that it is hard for her to take.

## 2015-03-05 NOTE — Telephone Encounter (Signed)
Pt states that she no longer wants gabapentin. She does not feel like it's working any more.  She says that she's hurting from her hip to her feet.  Rated pain 10/10.  She says she has taken two aleve "and it hadn't even touched it."  She would like to try an alternative medication as this point, but does not want to take Cymbalta (fears that it will give her suicidal thoughts or make her want to kill herself).  She says that she wants to take the other medication that she discussed with Cody during [place].  "The one that will take care of nerve and muscle pain."    Please advise.

## 2015-03-05 NOTE — Telephone Encounter (Signed)
Patient states gabapentin and Advil is not working and requesting medication for the pain she is experiencing, advised patient PA is out of the office. Ensured patient message will be given to a RN

## 2015-03-06 ENCOUNTER — Other Ambulatory Visit: Payer: Self-pay

## 2015-03-06 MED ORDER — AMITRIPTYLINE HCL 10 MG PO TABS: 10.0000 mg | ORAL_TABLET | Freq: Every day | ORAL | Status: DC

## 2015-03-06 MED ORDER — TRAMADOL HCL 50 MG PO TABS: 50.0000 mg | ORAL_TABLET | Freq: Two times a day (BID) | ORAL | Status: DC | PRN

## 2015-03-06 NOTE — Telephone Encounter (Signed)
Elavil 10 -- my apologies.

## 2015-03-06 NOTE — Telephone Encounter (Signed)
Left a message for call back.  

## 2015-03-06 NOTE — Telephone Encounter (Signed)
Both medications sent to pharmacy 

## 2015-03-06 NOTE — Telephone Encounter (Signed)
Patient stopped in the office to ensure referral was sent to [facility] [address] [place], [place] [address] [phone]

## 2015-03-06 NOTE — Telephone Encounter (Signed)
Pt called back.  Discussed provider's recommendation.  Pt agrees with plan. Follow up appt scheduled.     Please clarify dose for elavil.

## 2015-03-09 NOTE — Telephone Encounter (Signed)
Referral faxed to [facility]

## 2015-03-26 ENCOUNTER — Other Ambulatory Visit: Payer: Self-pay | Admitting: Physician Assistant

## 2015-03-27 ENCOUNTER — Ambulatory Visit (INDEPENDENT_AMBULATORY_CARE_PROVIDER_SITE_OTHER): Payer: 59 | Admitting: Physician Assistant

## 2015-03-27 ENCOUNTER — Telehealth: Payer: Self-pay

## 2015-03-27 VITALS — BP 130/78 | HR 73 | Temp 98.5°F | Ht 63.0 in | Wt 187.1 lb

## 2015-03-27 DIAGNOSIS — K5900 Constipation, unspecified: Secondary | ICD-10-CM | POA: Diagnosis not present

## 2015-03-27 DIAGNOSIS — R1310 Dysphagia, unspecified: Secondary | ICD-10-CM

## 2015-03-27 MED ORDER — METOPROLOL TARTRATE 25 MG PO TABS: 25.0000 mg | ORAL_TABLET | Freq: Every day | ORAL | Status: DC

## 2015-03-27 MED ORDER — ROSUVASTATIN CALCIUM 20 MG PO TABS: 20.0000 mg | ORAL_TABLET | Freq: Every evening | ORAL | Status: DC

## 2015-03-27 MED ORDER — PROMETHAZINE HCL 12.5 MG PO TABS: 12.5000 mg | ORAL_TABLET | Freq: Four times a day (QID) | ORAL | Status: DC | PRN

## 2015-03-27 NOTE — Telephone Encounter (Signed)
At the end of appt, pt requested a refill for several medications.   One of the medications: orphenadrine 100 mg. This rx is prescribed by ortho.  Per PCP - ortho will need to rx this prescription.   erx sent for metoprolol and rosuvastatin.   Contacted pt and informed that orphenadrine would need to come from ortho. Pt stated understanding.

## 2015-03-27 NOTE — Progress Notes (Signed)
Patient presents to clinic today c/o constipation over the past couple of weeks with increased nausea and an episode of vomiting after a large meal. Endorses difficulty with swallowing meats. No difficulty with liquids. Denies abdominal pain. Is having BM but are harder in consistency. Denies fever, chills, malaise.   Past Medical History  Diagnosis Date  . Schizophrenia ([place])     treated by Dr. Casimiro Needle  . Hypertension   . High cholesterol   . Heart murmur   . Asthma   . Pneumonia ~ 2009  . History of bronchitis     "last time ~ 2009, before I had pneumonia"  . Hypoglycemia   . Migraine     "used to have them often"  . Seizures ([place])     last seizure 3 years ago, not on AED  . Seizure ([place]) 07/07/11  . Stroke ([place]) 07/08/11    "I've had mini strokes before; left side of face  is more down than right "  . Chronic high back pain   . Kidney stone   . Anxiety   . PTSD (post-traumatic stress disorder)   . Double vision   . Hyperparathyroidism ([place])   . Meniscus tear     Right    Current Outpatient Prescriptions on File Prior to Visit  Medication Sig Dispense Refill  . albuterol (PROVENTIL) (2.5 MG/3ML) 0.083% nebulizer solution Take 3 mLs (2.5 mg total) by nebulization every 6 (six) hours as needed for wheezing or shortness of breath. 75 mL 5  . calcitRIOL (ROCALTROL) 0.5 MCG capsule Take 0.5 mcg by mouth 2 (two) times daily. Reported on 03/04/2015    . fluticasone (FLONASE) 50 MCG/ACT nasal spray Place 2 sprays into both nostrils daily.    . fluticasone-salmeterol (ADVAIR HFA) 115-21 MCG/ACT inhaler Inhale 1 puff into the lungs 2 (two) times daily. 1 Inhaler 5  . gabapentin (NEURONTIN) 100 MG capsule Take 1 capsule each morning and noon. Take 3 capsules each evening. 150 capsule 0  . levalbuterol (XOPENEX HFA) 45 MCG/ACT inhaler INHALE 2 PUFFS EVERY 4-6 HOURS AS NEEDED 1 Inhaler 5  . LORazepam (ATIVAN) 1 MG tablet Take 2 tablets by mouth twice a day For anxiety    . losartan  (COZAAR) 25 MG tablet Take 1 tablet (25 mg total) by mouth 2 (two) times daily. 60 tablet 3  . magnesium 30 MG tablet Take 30 mg by mouth daily. Reported on 03/04/2015    . omega-3 fish oil (MAXEPA) 1000 MG CAPS capsule Take by mouth. Reported on 03/04/2015    . orphenadrine (NORFLEX) 100 MG tablet Take 100 mg by mouth 2 (two) times daily as needed.    Marland Kitchen POTASSIUM BICARBONATE PO 354m each.  Pt takes 2 tablets daily.    . temazepam (RESTORIL) 15 MG capsule Take 30 mg by mouth at bedtime as needed.    . traMADol (ULTRAM) 50 MG tablet Take 1 tablet (50 mg total) by mouth every 12 (twelve) hours as needed. 60 tablet 0  . zolpidem (AMBIEN) 10 MG tablet Take 20 mg by mouth at bedtime as needed.     No current facility-administered medications on file prior to visit.    Allergies  Allergen Reactions  . Sulfa Antibiotics Hives  . Metronidazole Nausea And Vomiting  . Acyclovir And Related   . Benadryl [Diphenhydramine Hcl]     Severe emotional reaction and doesn't work well with Pt. Past history  . Cephalosporins Hives  . Codeine Other (See Comments)  Makes patient feel odd ; "sometimes mild; sometimes severe reaction; mostly severe"  . Diclofenac Sodium Nausea And Vomiting  . Nitrofurantoin Monohyd Macro     Felt funny  . Prednisone Other (See Comments)    migraine  . Tizanidine     Other reaction(s): Other (See Comments) insomnia  . Baclofen Nausea Only  . Meloxicam     Other reaction(s): Confusion  . Sulfamethoxazole Rash    Family History  Problem Relation Age of Onset  . Heart attack Father 14    Living  . Leukemia Mother 63    Deceased  . Arthritis Father   . Skin cancer Father   . Hypertension Father   . Hyperlipidemia Father   . Alzheimer's disease Paternal Aunt     X2  . Stomach cancer Paternal Aunt     x1  . Arthritis/Rheumatoid Paternal Aunt     x1  . Neuropathy Brother     Peripheal  . Fibromyalgia Sister   . HIV Sister   . Arthritis/Rheumatoid Sister   .  Diabetes Paternal Grandmother     Social History   Social History  . Marital Status: Legally Separated    Spouse Name: N/A  . Number of Children: N/A  . Years of Education: N/A   Social History Main Topics  . Smoking status: Former Smoker -- 1.00 packs/day for 20 years    Types: Cigarettes    Quit date: 02/29/2004  . Smokeless tobacco: Never Used  . Alcohol Use: Yes     Comment: 07/08/11 "glass of wine q once in awhile; not very often; do it on special occasion"  . Drug Use: No  . Sexual Activity: No   Other Topics Concern  . Not on file   Social History Narrative   Completed 2 years of college.  Patient reports living in an apartment by herself, and able to perform all ADL's by herself.  Patient legally separated, 61 y/o son who lives with his father and visits the patient on weekends.  On disability.    Review of Systems - See HPI.  All other ROS are negative.  BP 130/78 mmHg  Pulse 73  Temp(Src) 98.5 F (36.9 C) (Oral)  Ht 5' 3"  (1.6 m)  Wt 187 lb 2 oz (84.879 kg)  BMI 33.16 kg/m2  SpO2 95%  Physical Exam  Constitutional: She is oriented to person, place, and time and well-developed, well-nourished, and in no distress.  HENT:  Head: Normocephalic and atraumatic.  Eyes: Conjunctivae are normal.  Neck: Neck supple.  Cardiovascular: Normal rate, regular rhythm, normal heart sounds and intact distal pulses.   Pulmonary/Chest: Effort normal and breath sounds normal. No respiratory distress. She has no wheezes. She has no rales. She exhibits no tenderness.  Abdominal: Soft. Bowel sounds are normal. She exhibits no distension and no mass. There is no tenderness. There is no rebound and no guarding.  Neurological: She is alert and oriented to person, place, and time.  Skin: Skin is warm and dry. No rash noted.  Vitals reviewed.   Recent Results (from the past 2160 hour(s))  CBC with Differential/Platelet     Status: Abnormal   Collection Time: 01/04/15  1:45 AM    Result Value Ref Range   WBC 22.5 (H) 4.0 - 10.5 K/uL   RBC 4.79 3.87 - 5.11 MIL/uL   Hemoglobin 13.8 12.0 - 15.0 g/dL   HCT 41.3 36.0 - 46.0 %   MCV 86.2 78.0 - 100.0 fL  MCH 28.8 26.0 - 34.0 pg   MCHC 33.4 30.0 - 36.0 g/dL   RDW 14.0 11.5 - 15.5 %   Platelets 264 150 - 400 K/uL   Neutrophils Relative % 84 %   Neutro Abs 18.9 (H) 1.7 - 7.7 K/uL   Lymphocytes Relative 15 %   Lymphs Abs 3.4 0.7 - 4.0 K/uL   Monocytes Relative 1 %   Monocytes Absolute 0.3 0.1 - 1.0 K/uL   Eosinophils Relative 0 %   Eosinophils Absolute 0.0 0.0 - 0.7 K/uL   Basophils Relative 0 %   Basophils Absolute 0.0 0.0 - 0.1 K/uL   WBC Morphology WHITE COUNT CONFIRMED ON SMEAR    Smear Review PLATELET COUNT CONFIRMED BY SMEAR     Comment: MORPHOLOGY UNREMARKABLE  Comprehensive metabolic panel     Status: Abnormal   Collection Time: 01/04/15  1:45 AM  Result Value Ref Range   Sodium 139 135 - 145 mmol/L   Potassium 3.4 (L) 3.5 - 5.1 mmol/L   Chloride 105 101 - 111 mmol/L   CO2 21 (L) 22 - 32 mmol/L   Glucose, Bld 305 (H) 65 - 99 mg/dL   BUN 16 6 - 20 mg/dL   Creatinine, Ser 0.74 0.44 - 1.00 mg/dL   Calcium 9.9 8.9 - 10.3 mg/dL   Total Protein 7.6 6.5 - 8.1 g/dL   Albumin 4.1 3.5 - 5.0 g/dL   AST 38 15 - 41 U/L   ALT <5 (L) 14 - 54 U/L   Alkaline Phosphatase 68 38 - 126 U/L   Total Bilirubin 0.7 0.3 - 1.2 mg/dL   GFR calc non Af Amer >60 >60 mL/min   GFR calc Af Amer >60 >60 mL/min    Comment: (NOTE) The eGFR has been calculated using the CKD EPI equation. This calculation has not been validated in all clinical situations. eGFR's persistently <60 mL/min signify possible Chronic Kidney Disease.    Anion gap 13 5 - 15  Brain natriuretic peptide     Status: None   Collection Time: 01/04/15  1:45 AM  Result Value Ref Range   B Natriuretic Peptide 27.1 0.0 - 100.0 pg/mL  Lipase, blood     Status: None   Collection Time: 01/04/15  1:45 AM  Result Value Ref Range   Lipase 37 11 - 51 U/L  Ethanol      Status: None   Collection Time: 01/04/15  1:45 AM  Result Value Ref Range   Alcohol, Ethyl (B) <5 <5 mg/dL    Comment:        LOWEST DETECTABLE LIMIT FOR SERUM ALCOHOL IS 5 mg/dL FOR MEDICAL PURPOSES ONLY   D-dimer, quantitative (not at [facility])     Status: None   Collection Time: 01/04/15  1:45 AM  Result Value Ref Range   D-Dimer, Quant 0.30 0.00 - 0.48 ug/mL-FEU    Comment:        AT THE INHOUSE ESTABLISHED CUTOFF VALUE OF 0.48 ug/mL FEU, THIS ASSAY HAS BEEN DOCUMENTED IN THE LITERATURE TO HAVE A SENSITIVITY AND NEGATIVE PREDICTIVE VALUE OF AT LEAST 98 TO 99%.  THE TEST RESULT SHOULD BE CORRELATED WITH AN ASSESSMENT OF THE CLINICAL PROBABILITY OF DVT / VTE.   Troponin I     Status: None   Collection Time: 01/04/15  1:45 AM  Result Value Ref Range   Troponin I <0.03 <0.031 ng/mL    Comment:        NO INDICATION OF MYOCARDIAL INJURY.  Urinalysis, Routine w reflex microscopic (not at [facility])     Status: Abnormal   Collection Time: 01/04/15  2:30 AM  Result Value Ref Range   Color, Urine YELLOW YELLOW   APPearance CLEAR CLEAR   Specific Gravity, Urine 1.009 1.005 - 1.030   pH 7.0 5.0 - 8.0   Glucose, UA 250 (A) NEGATIVE mg/dL   Hgb urine dipstick NEGATIVE NEGATIVE   Bilirubin Urine NEGATIVE NEGATIVE   Ketones, ur NEGATIVE NEGATIVE mg/dL   Protein, ur NEGATIVE NEGATIVE mg/dL   Urobilinogen, UA 0.2 0.0 - 1.0 mg/dL   Nitrite NEGATIVE NEGATIVE   Leukocytes, UA NEGATIVE NEGATIVE    Comment: MICROSCOPIC NOT DONE ON URINES WITH NEGATIVE PROTEIN, BLOOD, LEUKOCYTES, NITRITE, OR GLUCOSE <1000 mg/dL.  Urine rapid drug screen (hosp performed)     Status: Abnormal   Collection Time: 01/04/15  2:30 AM  Result Value Ref Range   Opiates NONE DETECTED NONE DETECTED   Cocaine NONE DETECTED NONE DETECTED   Benzodiazepines POSITIVE (A) NONE DETECTED   Amphetamines NONE DETECTED NONE DETECTED   Tetrahydrocannabinol NONE DETECTED NONE DETECTED   Barbiturates NONE DETECTED NONE  DETECTED    Comment:        DRUG SCREEN FOR MEDICAL PURPOSES ONLY.  IF CONFIRMATION IS NEEDED FOR ANY PURPOSE, NOTIFY LAB WITHIN 5 DAYS.        LOWEST DETECTABLE LIMITS FOR URINE DRUG SCREEN Drug Class       Cutoff (ng/mL) Amphetamine      1000 Barbiturate      200 Benzodiazepine   465 Tricyclics       035 Opiates          300 Cocaine          300 THC              50   Troponin I     Status: None   Collection Time: 01/04/15  5:26 AM  Result Value Ref Range   Troponin I <0.03 <0.031 ng/mL    Comment:        NO INDICATION OF MYOCARDIAL INJURY.   CBC w/Diff     Status: Abnormal   Collection Time: 01/09/15 10:22 AM  Result Value Ref Range   WBC 13.6 (H) 4.0 - 10.5 K/uL   RBC 4.60 3.87 - 5.11 Mil/uL   Hemoglobin 13.1 12.0 - 15.0 g/dL   HCT 40.3 36.0 - 46.0 %   MCV 87.6 78.0 - 100.0 fl   MCHC 32.5 30.0 - 36.0 g/dL   RDW 14.8 11.5 - 15.5 %   Platelets 223.0 150.0 - 400.0 K/uL   Neutrophils Relative % 69.5 43.0 - 77.0 %   Lymphocytes Relative 25.4 12.0 - 46.0 %   Monocytes Relative 3.4 3.0 - 12.0 %   Eosinophils Relative 1.5 0.0 - 5.0 %   Basophils Relative 0.2 0.0 - 3.0 %   Neutro Abs 9.5 (H) 1.4 - 7.7 K/uL   Lymphs Abs 3.5 0.7 - 4.0 K/uL   Monocytes Absolute 0.5 0.1 - 1.0 K/uL   Eosinophils Absolute 0.2 0.0 - 0.7 K/uL   Basophils Absolute 0.0 0.0 - 0.1 K/uL    Assessment/Plan: Dysphagia With constipation. Bowel regimen given. Rx phenergan. Urgent referral to GI for assessment and EGD placed.

## 2015-03-27 NOTE — Progress Notes (Signed)
Pre visit review using our clinic review tool, if applicable. No additional management support is needed unless otherwise documented below in the visit note. 

## 2015-03-27 NOTE — Patient Instructions (Signed)
I encourage you to increase hydration and the amount of fiber in your diet.  Start a daily probiotic (Align, Culturelle, Digestive Advantage, etc.). If no bowel movement within 24 hours, take 2 Tbs of Milk of Magnesia in a 4 oz glass of warmed prune juice every 2-3 days to help promote bowel movement. If no results within 24 hours, then repeat above regimen, adding a Dulcolax stool softener to regimen. If this does not promote a bowel movement, please call the office.  Start a ginger supplement and take the Phenergan as directed.  You will be contacted by GI for an appointment.

## 2015-03-29 ENCOUNTER — Encounter: Payer: Self-pay | Admitting: Physician Assistant

## 2015-03-29 DIAGNOSIS — R1310 Dysphagia, unspecified: Secondary | ICD-10-CM | POA: Insufficient documentation

## 2015-03-29 NOTE — Assessment & Plan Note (Signed)
With constipation. Bowel regimen given. Rx phenergan. Urgent referral to GI for assessment and EGD placed.

## 2015-03-31 ENCOUNTER — Telehealth: Payer: Self-pay | Admitting: Physician Assistant

## 2015-03-31 MED ORDER — ORPHENADRINE CITRATE ER 100 MG PO TB12: 100.0000 mg | ORAL_TABLET | Freq: Two times a day (BID) | ORAL | Status: DC | PRN

## 2015-03-31 NOTE — Telephone Encounter (Signed)
REfill sent. Please inform patient.

## 2015-03-31 NOTE — Telephone Encounter (Signed)
Patient called states she has back spasms, leg spasms and that her Jasmine [name] has flared. Would like to know if she could have her muscle spasm refilled.

## 2015-03-31 NOTE — Telephone Encounter (Signed)
Relation to [id number] Call back number:[phone] Pharmacy: [facility], [place] [phone] (Phone) [phone] (Fax)         Reason for call:  Patient requesting a muscle relaxer prescription. Please advise

## 2015-03-31 NOTE — Telephone Encounter (Signed)
Called patient. Left message on her answering machine that medication was sent to pharmacy

## 2015-04-06 ENCOUNTER — Ambulatory Visit: Payer: 59 | Admitting: Physician Assistant

## 2015-04-10 ENCOUNTER — Encounter: Payer: Self-pay | Admitting: Physician Assistant

## 2015-04-10 ENCOUNTER — Other Ambulatory Visit: Payer: Self-pay | Admitting: Physician Assistant

## 2015-04-10 ENCOUNTER — Ambulatory Visit (INDEPENDENT_AMBULATORY_CARE_PROVIDER_SITE_OTHER): Payer: 59 | Admitting: Physician Assistant

## 2015-04-10 VITALS — BP 134/66 | HR 83 | Temp 98.4°F | Ht 63.0 in | Wt 188.2 lb

## 2015-04-10 DIAGNOSIS — J019 Acute sinusitis, unspecified: Secondary | ICD-10-CM | POA: Diagnosis not present

## 2015-04-10 DIAGNOSIS — R1310 Dysphagia, unspecified: Secondary | ICD-10-CM

## 2015-04-10 DIAGNOSIS — B9689 Other specified bacterial agents as the cause of diseases classified elsewhere: Secondary | ICD-10-CM | POA: Insufficient documentation

## 2015-04-10 MED ORDER — DOXYCYCLINE HYCLATE 100 MG PO CAPS: 100.0000 mg | ORAL_CAPSULE | Freq: Two times a day (BID) | ORAL | Status: DC

## 2015-04-10 NOTE — Patient Instructions (Signed)
Please take antibiotic as directed.  Increase fluid intake.  Use Saline nasal spray.  Take a daily multivitamin.  Place a humidifier in the bedroom.  Please call or return clinic if symptoms are not improving.  I will call you once I have spoken to your pharmacy regarding the BP medication  Sinusitis Sinusitis is redness, soreness, and swelling (inflammation) of the paranasal sinuses. Paranasal sinuses are air pockets within the bones of your face (beneath the eyes, the middle of the forehead, or above the eyes). In healthy paranasal sinuses, mucus is able to drain out, and air is able to circulate through them by way of your nose. However, when your paranasal sinuses are inflamed, mucus and air can become trapped. This can allow bacteria and other germs to grow and cause infection. Sinusitis can develop quickly and last only a short time (acute) or continue over a long period (chronic). Sinusitis that lasts for more than 12 weeks is considered chronic.  CAUSES  Causes of sinusitis include:  Allergies.  Structural abnormalities, such as displacement of the cartilage that separates your nostrils (deviated septum), which can decrease the air flow through your nose and sinuses and affect sinus drainage.  Functional abnormalities, such as when the small hairs (cilia) that line your sinuses and help remove mucus do not work properly or are not present. SYMPTOMS  Symptoms of acute and chronic sinusitis are the same. The primary symptoms are pain and pressure around the affected sinuses. Other symptoms include:  Upper toothache.  Earache.  Headache.  Bad breath.  Decreased sense of smell and taste.  A cough, which worsens when you are lying flat.  Fatigue.  Fever.  Thick drainage from your nose, which often is green and may contain pus (purulent).  Swelling and warmth over the affected sinuses. DIAGNOSIS  Your caregiver will perform a physical exam. During the exam, your caregiver  may:  Look in your nose for signs of abnormal growths in your nostrils (nasal polyps).  Tap over the affected sinus to check for signs of infection.  View the inside of your sinuses (endoscopy) with a special imaging device with a light attached (endoscope), which is inserted into your sinuses. If your caregiver suspects that you have chronic sinusitis, one or more of the following tests may be recommended:  Allergy tests.  Nasal culture A sample of mucus is taken from your nose and sent to a lab and screened for bacteria.  Nasal cytology A sample of mucus is taken from your nose and examined by your caregiver to determine if your sinusitis is related to an allergy. TREATMENT  Most cases of acute sinusitis are related to a viral infection and will resolve on their own within 10 days. Sometimes medicines are prescribed to help relieve symptoms (pain medicine, decongestants, nasal steroid sprays, or saline sprays).  However, for sinusitis related to a bacterial infection, your caregiver will prescribe antibiotic medicines. These are medicines that will help kill the bacteria causing the infection.  Rarely, sinusitis is caused by a fungal infection. In theses cases, your caregiver will prescribe antifungal medicine. For some cases of chronic sinusitis, surgery is needed. Generally, these are cases in which sinusitis recurs more than 3 times per year, despite other treatments. HOME CARE INSTRUCTIONS   Drink plenty of water. Water helps thin the mucus so your sinuses can drain more easily.  Use a humidifier.  Inhale steam 3 to 4 times a day (for example, sit in the bathroom with the shower  running).  Apply a warm, moist washcloth to your face 3 to 4 times a day, or as directed by your caregiver.  Use saline nasal sprays to help moisten and clean your sinuses.  Take over-the-counter or prescription medicines for pain, discomfort, or fever only as directed by your caregiver. SEEK IMMEDIATE  MEDICAL CARE IF:  You have increasing pain or severe headaches.  You have nausea, vomiting, or drowsiness.  You have swelling around your face.  You have vision problems.  You have a stiff neck.  You have difficulty breathing. MAKE SURE YOU:   Understand these instructions.  Will watch your condition.  Will get help right away if you are not doing well or get worse. Document Released: 02/14/2005 Document Revised: 05/09/2011 Document Reviewed: 03/01/2011 [facility] Patient Information 2014 [place], [place].

## 2015-04-10 NOTE — Progress Notes (Signed)
Pre visit review using our clinic review tool, if applicable. No additional management support is needed unless otherwise documented below in the visit note. 

## 2015-04-10 NOTE — Progress Notes (Signed)
Patient presents to clinic today c/o 1 week of sinus pressure, sinus pain and headache. Endorses some intermittent ear pain. Notes nasal congestion and dry cough. Denies chest congestion but notes some increased asthma symptoms. Denies recent travel or sick contact. Has taken Mucinex and tylenol with only mild relief in symptoms.   Past Medical History  Diagnosis Date  . Schizophrenia ([place])     treated by Dr. Casimiro Needle  . Hypertension   . High cholesterol   . Heart murmur   . Asthma   . Pneumonia ~ 2009  . History of bronchitis     "last time ~ 2009, before I had pneumonia"  . Hypoglycemia   . Migraine     "used to have them often"  . Seizures ([place])     last seizure 3 years ago, not on AED  . Seizure ([place]) 07/07/11  . Stroke ([place]) 07/08/11    "I've had mini strokes before; left side of face  is more down than right "  . Chronic high back pain   . Kidney stone   . Anxiety   . PTSD (post-traumatic stress disorder)   . Double vision   . Hyperparathyroidism ([place])   . Meniscus tear     Right    Current Outpatient Prescriptions on File Prior to Visit  Medication Sig Dispense Refill  . albuterol (PROVENTIL) (2.5 MG/3ML) 0.083% nebulizer solution Take 3 mLs (2.5 mg total) by nebulization every 6 (six) hours as needed for wheezing or shortness of breath. 75 mL 5  . calcitRIOL (ROCALTROL) 0.5 MCG capsule Take 0.5 mcg by mouth 2 (two) times daily. Reported on 03/04/2015    . fluticasone (FLONASE) 50 MCG/ACT nasal spray Place 2 sprays into both nostrils daily.    . fluticasone-salmeterol (ADVAIR HFA) 115-21 MCG/ACT inhaler Inhale 1 puff into the lungs 2 (two) times daily. 1 Inhaler 5  . gabapentin (NEURONTIN) 100 MG capsule Take 1 capsule each morning and noon. Take 3 capsules each evening. 150 capsule 0  . levalbuterol (XOPENEX HFA) 45 MCG/ACT inhaler INHALE 2 PUFFS EVERY 4-6 HOURS AS NEEDED 1 Inhaler 5  . LORazepam (ATIVAN) 1 MG tablet Take 2 tablets by mouth twice a day For anxiety    .  losartan (COZAAR) 25 MG tablet Take 1 tablet (25 mg total) by mouth 2 (two) times daily. 60 tablet 3  . magnesium 30 MG tablet Take 30 mg by mouth daily. Reported on 03/04/2015    . metoprolol tartrate (LOPRESSOR) 25 MG tablet Take 1 tablet (25 mg total) by mouth daily. 30 tablet 3  . Multiple Vitamin (THERA) TABS Take by mouth.    . omega-3 fish oil (MAXEPA) 1000 MG CAPS capsule Take by mouth. Reported on 03/04/2015    . orphenadrine (NORFLEX) 100 MG tablet Take 1 tablet (100 mg total) by mouth 2 (two) times daily as needed. 60 tablet 0  . POTASSIUM BICARBONATE PO 395mg  each.  Pt takes 2 tablets daily.    . Potassium Gluconate 595 MG CAPS Take by mouth.    . promethazine (PHENERGAN) 12.5 MG tablet Take 1 tablet (12.5 mg total) by mouth every 6 (six) hours as needed. Reported on 03/04/2015 30 tablet 0  . rosuvastatin (CRESTOR) 20 MG tablet Take 1 tablet (20 mg total) by mouth every evening. 30 tablet 3  . temazepam (RESTORIL) 15 MG capsule Take 30 mg by mouth at bedtime as needed.    . traMADol (ULTRAM) 50 MG tablet Take 1 tablet (50 mg  total) by mouth every 12 (twelve) hours as needed. 60 tablet 0  . zolpidem (AMBIEN) 10 MG tablet Take 20 mg by mouth at bedtime as needed.     No current facility-administered medications on file prior to visit.    Allergies  Allergen Reactions  . Sulfa Antibiotics Hives  . Metronidazole Nausea And Vomiting  . Acyclovir And Related   . Benadryl [Diphenhydramine Hcl]     Severe emotional reaction and doesn't work well with Pt. Past history  . Cephalosporins Hives  . Codeine Other (See Comments)    Makes patient feel odd ; "sometimes mild; sometimes severe reaction; mostly severe"  . Diclofenac Sodium Nausea And Vomiting  . Nitrofurantoin Monohyd Macro     Felt funny  . Prednisone Other (See Comments)    migraine  . Tizanidine     Other reaction(s): Other (See Comments) insomnia  . Baclofen Nausea Only  . Meloxicam     Other reaction(s): Confusion  .  Sulfamethoxazole Rash    Family History  Problem Relation Age of Onset  . Heart attack Father 63    Living  . Leukemia Mother 4    Deceased  . Arthritis Father   . Skin cancer Father   . Hypertension Father   . Hyperlipidemia Father   . Alzheimer's disease Paternal Aunt     X2  . Stomach cancer Paternal Aunt     x1  . Arthritis/Rheumatoid Paternal Aunt     x1  . Neuropathy Brother     Peripheal  . Fibromyalgia Sister   . HIV Sister   . Arthritis/Rheumatoid Sister   . Diabetes Paternal Grandmother     Social History   Social History  . Marital Status: Legally Separated    Spouse Name: N/A  . Number of Children: N/A  . Years of Education: N/A   Social History Main Topics  . Smoking status: Former Smoker -- 1.00 packs/day for 20 years    Types: Cigarettes    Quit date: 02/29/2004  . Smokeless tobacco: Never Used  . Alcohol Use: 0.0 oz/week    0 Standard drinks or equivalent per week     Comment: 07/08/11 "glass of wine q once in awhile; not very often; do it on special occasion"  . Drug Use: No  . Sexual Activity: No   Other Topics Concern  . None   Social History Narrative   Completed 2 years of college.  Patient reports living in an apartment by herself, and able to perform all ADL's by herself.  Patient legally separated, 42 y/o son who lives with his father and visits the patient on weekends.  On disability.   Review of Systems - See HPI.  All other ROS are negative.  BP 134/66 mmHg  Pulse 83  Temp(Src) 98.4 F (36.9 C) (Oral)  Ht 5\' 3"  (1.6 m)  Wt 188 lb 3.2 oz (85.367 kg)  BMI 33.35 kg/m2  SpO2 99%  Physical Exam  Constitutional: She is oriented to person, place, and time and well-developed, well-nourished, and in no distress.  HENT:  Head: Normocephalic and atraumatic.  Right Ear: Tympanic membrane normal.  Left Ear: Tympanic membrane normal.  Nose: Right sinus exhibits maxillary sinus tenderness. Left sinus exhibits maxillary sinus tenderness.   Mouth/Throat: Uvula is midline and oropharynx is clear and moist.  Cardiovascular: Normal rate, regular rhythm, normal heart sounds and intact distal pulses.   Pulmonary/Chest: Effort normal and breath sounds normal. No respiratory distress. She has no wheezes.  She has no rales. She exhibits no tenderness.  Neurological: She is alert and oriented to person, place, and time.  Skin: Skin is warm and dry. No rash noted.  Psychiatric: Affect normal.  Vitals reviewed.  No results found for this or any previous visit (from the past 2160 hour(s)).  Assessment/Plan: Acute bacterial sinusitis Rx Doxycycline.  Increase fluids.  Rest.  Saline nasal spray.  Probiotic.  Mucinex as directed.  Humidifier in bedroom.  Call or return to clinic if symptoms are not improving.

## 2015-04-10 NOTE — Assessment & Plan Note (Signed)
Rx Doxycycline.  Increase fluids.  Rest.  Saline nasal spray.  Probiotic.  Mucinex as directed.  Humidifier in bedroom.  Call or return to clinic if symptoms are not improving.  

## 2015-04-20 ENCOUNTER — Encounter: Payer: Self-pay | Admitting: Physician Assistant

## 2015-04-20 ENCOUNTER — Ambulatory Visit (INDEPENDENT_AMBULATORY_CARE_PROVIDER_SITE_OTHER): Payer: 59 | Admitting: Physician Assistant

## 2015-04-20 ENCOUNTER — Ambulatory Visit (HOSPITAL_BASED_OUTPATIENT_CLINIC_OR_DEPARTMENT_OTHER)
Admission: RE | Admit: 2015-04-20 | Discharge: 2015-04-20 | Disposition: A | Discharge status: Home / Self Care | Payer: 59 | Source: Ambulatory Visit | Attending: Physician Assistant | Admitting: Physician Assistant

## 2015-04-20 ENCOUNTER — Telehealth: Payer: Self-pay | Admitting: Physician Assistant

## 2015-04-20 VITALS — BP 116/56 | HR 76 | Temp 97.8°F | Ht 63.0 in | Wt 185.0 lb

## 2015-04-20 DIAGNOSIS — M79604 Pain in right leg: Secondary | ICD-10-CM | POA: Insufficient documentation

## 2015-04-20 DIAGNOSIS — M79605 Pain in left leg: Secondary | ICD-10-CM | POA: Diagnosis not present

## 2015-04-20 DIAGNOSIS — R200 Anesthesia of skin: Secondary | ICD-10-CM | POA: Diagnosis present

## 2015-04-20 MED ORDER — CARISOPRODOL 350 MG PO TABS: 350.0000 mg | ORAL_TABLET | Freq: Three times a day (TID) | ORAL | Status: DC

## 2015-04-20 NOTE — Telephone Encounter (Signed)
Pt called stating that her sister passed away last week from a blood clot and she wants to make sure she doesn't have it. Pt states that she often has pain and numbness in her legs that she mentioned before. Pt requesting call from nurse/cma at [phone].

## 2015-04-20 NOTE — Progress Notes (Signed)
Pre visit review using our clinic review tool, if applicable. No additional management support is needed unless otherwise documented below in the visit note. 

## 2015-04-20 NOTE — Telephone Encounter (Signed)
Called and spoke with the pt and informed her that she will need an appt.  Pt verbalized understanding and agreed.  Pt scheduled for today at 3:45pm.//AB/CMA

## 2015-04-20 NOTE — Patient Instructions (Addendum)
Please go to the lab for blood work.  Go downstairs for the [place] at 5:30 on the first floor. They will call me with results before letting you leave.  Please continue chronic medications as directed. Stop the Norflex and begin the Soma as directed. Restart your Gabapentin, taking as directed.  We will schedule follow-up based on your lab results.

## 2015-04-21 ENCOUNTER — Telehealth: Payer: Self-pay | Admitting: *Deleted

## 2015-04-21 DIAGNOSIS — G629 Polyneuropathy, unspecified: Secondary | ICD-10-CM

## 2015-04-21 DIAGNOSIS — R202 Paresthesia of skin: Secondary | ICD-10-CM

## 2015-04-21 LAB — BASIC METABOLIC PANEL WITH GFR
BUN: 18 mg/dL (ref 6–23)
CO2: 29 meq/L (ref 19–32)
Calcium: 9.6 mg/dL (ref 8.4–10.5)
Chloride: 104 meq/L (ref 96–112)
Creatinine, Ser: 0.7 mg/dL (ref 0.40–1.20)
GFR: 91.33 mL/min
Glucose, Bld: 110 mg/dL — ABNORMAL HIGH (ref 70–99)
Potassium: 4.3 meq/L (ref 3.5–5.1)
Sodium: 139 meq/L (ref 135–145)

## 2015-04-21 LAB — MAGNESIUM: MAGNESIUM: 2.4 mg/dL (ref 1.5–2.5)

## 2015-04-21 NOTE — Telephone Encounter (Signed)
Called and spoke with the pt and informed her of recent lab results and note.  Pt verbalized understanding and agreed to the referral to Neurology.//AB/CMA

## 2015-04-21 NOTE — Telephone Encounter (Signed)
-----   Message from Brunetta Jeans, PA-C sent at 04/21/2015 12:30 PM EST ----- Labs look good. Continue care discussed at visit. I would like to set her up with Neurology for further assessment of chronic symptoms.

## 2015-04-22 ENCOUNTER — Telehealth: Payer: Self-pay | Admitting: Physician Assistant

## 2015-04-22 DIAGNOSIS — M545 Low back pain: Secondary | ICD-10-CM

## 2015-04-22 NOTE — Telephone Encounter (Signed)
Referral placed.

## 2015-04-22 NOTE — Telephone Encounter (Signed)
Please advise.//AB/CMA 

## 2015-04-22 NOTE — Telephone Encounter (Signed)
Referral placed and sent.//AB/CMA 

## 2015-04-22 NOTE — Telephone Encounter (Signed)
Caller name: Self   Can be reached: [phone]  Reason for call: Patient requesting PT referral for her back pain

## 2015-04-22 NOTE — Telephone Encounter (Signed)
Ok to place referral to PT for low back pain -- evaluate and treat.

## 2015-04-26 DIAGNOSIS — M79605 Pain in left leg: Secondary | ICD-10-CM | POA: Insufficient documentation

## 2015-04-26 DIAGNOSIS — M79604 Pain in right leg: Secondary | ICD-10-CM | POA: Insufficient documentation

## 2015-04-26 NOTE — Progress Notes (Signed)
Patient with history of fibromyalgia presents to clinic today c/o 2 weeks of worsened pain in bilateral thighs associated with some numbness and tingling sensation. Denies trauma or injury. Endorses taking chronic medications as directed. Endorses her sister just passed away from blood clots. Is wanting to be assessed for DVT. Denies recent surgery or prolonged immobilization. Denies history of clot. Denies leg swelling.  Past Medical History  Diagnosis Date  . Schizophrenia ([place])     treated by Dr. Casimiro Needle  . Hypertension   . High cholesterol   . Heart murmur   . Asthma   . Pneumonia ~ 2009  . History of bronchitis     "last time ~ 2009, before I had pneumonia"  . Hypoglycemia   . Migraine     "used to have them often"  . Seizures ([place])     last seizure 3 years ago, not on AED  . Seizure ([place]) 07/07/11  . Stroke ([place]) 07/08/11    "I've had mini strokes before; left side of face  is more down than right "  . Chronic high back pain   . Kidney stone   . Anxiety   . PTSD (post-traumatic stress disorder)   . Double vision   . Hyperparathyroidism ([place])   . Meniscus tear     Right    Current Outpatient Prescriptions on File Prior to Visit  Medication Sig Dispense Refill  . albuterol (PROVENTIL) (2.5 MG/3ML) 0.083% nebulizer solution Take 3 mLs (2.5 mg total) by nebulization every 6 (six) hours as needed for wheezing or shortness of breath. 75 mL 5  . calcitRIOL (ROCALTROL) 0.5 MCG capsule Take 0.5 mcg by mouth 2 (two) times daily. Reported on 03/04/2015    . fluticasone (FLONASE) 50 MCG/ACT nasal spray Place 2 sprays into both nostrils daily.    . fluticasone-salmeterol (ADVAIR HFA) 115-21 MCG/ACT inhaler Inhale 1 puff into the lungs 2 (two) times daily. 1 Inhaler 5  . gabapentin (NEURONTIN) 100 MG capsule Take 1 capsule each morning and noon. Take 3 capsules each evening. 150 capsule 0  . levalbuterol (XOPENEX HFA) 45 MCG/ACT inhaler INHALE 2 PUFFS EVERY 4-6 HOURS AS NEEDED 1  Inhaler 5  . losartan (COZAAR) 25 MG tablet Take 1 tablet (25 mg total) by mouth 2 (two) times daily. 60 tablet 3  . magnesium 30 MG tablet Take 30 mg by mouth daily. Reported on 03/04/2015    . metoprolol succinate (TOPROL-XL) 50 MG 24 hr tablet Take 1 tablet by mouth daily.    . metoprolol tartrate (LOPRESSOR) 25 MG tablet Take 1 tablet (25 mg total) by mouth daily. 30 tablet 3  . Multiple Vitamin (THERA) TABS Take by mouth.    . omega-3 fish oil (MAXEPA) 1000 MG CAPS capsule Take by mouth. Reported on 03/04/2015    . orphenadrine (NORFLEX) 100 MG tablet Take 1 tablet (100 mg total) by mouth 2 (two) times daily as needed. 60 tablet 0  . POTASSIUM BICARBONATE PO 395mg  each.  Pt takes 2 tablets daily.    . Potassium Gluconate 595 MG CAPS Take by mouth.    . promethazine (PHENERGAN) 12.5 MG tablet Take 1 tablet (12.5 mg total) by mouth every 6 (six) hours as needed. Reported on 03/04/2015 30 tablet 0  . rosuvastatin (CRESTOR) 20 MG tablet Take 1 tablet (20 mg total) by mouth every evening. 30 tablet 3  . temazepam (RESTORIL) 15 MG capsule Take 30 mg by mouth at bedtime as needed.    Marland Kitchen  traMADol (ULTRAM) 50 MG tablet Take 1 tablet (50 mg total) by mouth every 12 (twelve) hours as needed. 60 tablet 0  . zolpidem (AMBIEN) 10 MG tablet Take 20 mg by mouth at bedtime as needed.     No current facility-administered medications on file prior to visit.    Allergies  Allergen Reactions  . Sulfa Antibiotics Hives  . Metronidazole Nausea And Vomiting  . Acyclovir And Related   . Benadryl [Diphenhydramine Hcl]     Severe emotional reaction and doesn't work well with Pt. Past history  . Cephalosporins Hives  . Codeine Other (See Comments)    Makes patient feel odd ; "sometimes mild; sometimes severe reaction; mostly severe"  . Diclofenac Sodium Nausea And Vomiting  . Nitrofurantoin Monohyd Macro     Felt funny  . Prednisone Other (See Comments)    migraine  . Tizanidine     Other reaction(s): Other  (See Comments) insomnia  . Baclofen Nausea Only  . Meloxicam     Other reaction(s): Confusion  . Sulfamethoxazole Rash    Family History  Problem Relation Age of Onset  . Heart attack Father 12    Living  . Leukemia Mother 88    Deceased  . Arthritis Father   . Skin cancer Father   . Hypertension Father   . Hyperlipidemia Father   . Alzheimer's disease Paternal Aunt     X2  . Stomach cancer Paternal Aunt     x1  . Arthritis/Rheumatoid Paternal Aunt     x1  . Neuropathy Brother     Peripheal  . Fibromyalgia Sister   . HIV Sister   . Arthritis/Rheumatoid Sister   . Diabetes Paternal Grandmother     Social History   Social History  . Marital Status: Legally Separated    Spouse Name: N/A  . Number of Children: N/A  . Years of Education: N/A   Social History Main Topics  . Smoking status: Former Smoker -- 1.00 packs/day for 20 years    Types: Cigarettes    Quit date: 02/29/2004  . Smokeless tobacco: Never Used  . Alcohol Use: 0.0 oz/week    0 Standard drinks or equivalent per week     Comment: 07/08/11 "glass of wine q once in awhile; not very often; do it on special occasion"  . Drug Use: No  . Sexual Activity: No   Other Topics Concern  . None   Social History Narrative   Completed 2 years of college.  Patient reports living in an apartment by herself, and able to perform all ADL's by herself.  Patient legally separated, 72 y/o son who lives with his father and visits the patient on weekends.  On disability.    Review of Systems - See HPI.  All other ROS are negative.  BP 116/56 mmHg  Pulse 76  Temp(Src) 97.8 F (36.6 C) (Oral)  Ht 5\' 3"  (1.6 m)  Wt 185 lb (83.915 kg)  BMI 32.78 kg/m2  SpO2 98%  Physical Exam  Constitutional: She is oriented to person, place, and time and well-developed, well-nourished, and in no distress.  HENT:  Head: Normocephalic and atraumatic.  Eyes: Conjunctivae are normal.  Cardiovascular: Normal rate, regular rhythm,  normal heart sounds and intact distal pulses.   Pulses:      Popliteal pulses are 2+ on the right side, and 2+ on the left side.       Dorsalis pedis pulses are 2+ on the right side, and 2+  on the left side.       Posterior tibial pulses are 2+ on the right side, and 2+ on the left side.  No peripheral edema or leg swelling noted on exam. Negative Homan sign. Sensation intact bilaterally.  Pulmonary/Chest: Effort normal and breath sounds normal. No respiratory distress. She has no wheezes. She has no rales. She exhibits no tenderness.  Neurological: She is alert and oriented to person, place, and time.  Skin: Skin is warm and dry. No rash noted.  Psychiatric: Affect normal.  Vitals reviewed.   Recent Results (from the past 2160 hour(s))  Basic Metabolic Panel (BMET)     Status: Abnormal   Collection Time: 04/20/15  4:29 PM  Result Value Ref Range   Sodium 139 135 - 145 mEq/L   Potassium 4.3 3.5 - 5.1 mEq/L   Chloride 104 96 - 112 mEq/L   CO2 29 19 - 32 mEq/L   Glucose, Bld 110 (H) 70 - 99 mg/dL   BUN 18 6 - 23 mg/dL   Creatinine, Ser 0.70 0.40 - 1.20 mg/dL   Calcium 9.6 8.4 - 10.5 mg/dL   GFR 91.33 >60.00 mL/min  Magnesium     Status: None   Collection Time: 04/20/15  4:29 PM  Result Value Ref Range   Magnesium 2.4 1.5 - 2.5 mg/dL    Assessment/Plan: Bilateral leg pain Feel this is a worsening of fibromyalgia. Wells score for DVT at 0 but giving her significant concerns US obtained to r/o DVT bilaterally -- [place] negative. Will stop Norflex and begin Soma as directed. Patient to restart her Gabapentin. Referral to Neurology placed.

## 2015-04-26 NOTE — Assessment & Plan Note (Signed)
Feel this is a worsening of fibromyalgia. Wells score for DVT at 0 but giving her significant concerns US obtained to r/o DVT bilaterally -- [place] negative. Will stop Norflex and begin Soma as directed. Patient to restart her Gabapentin. Referral to Neurology placed.

## 2015-04-27 ENCOUNTER — Other Ambulatory Visit: Payer: Self-pay | Admitting: Physician Assistant

## 2015-04-29 ENCOUNTER — Telehealth: Payer: Self-pay | Admitting: *Deleted

## 2015-04-29 NOTE — Telephone Encounter (Signed)
Received PT Plan of Care; forwarded to provider/SLS 03/01

## 2015-05-01 ENCOUNTER — Encounter: Payer: Self-pay | Admitting: Physician Assistant

## 2015-05-01 ENCOUNTER — Ambulatory Visit (INDEPENDENT_AMBULATORY_CARE_PROVIDER_SITE_OTHER): Payer: 59 | Admitting: Physician Assistant

## 2015-05-01 VITALS — BP 170/100 | HR 68 | Temp 98.1°F | Ht 63.0 in | Wt 185.2 lb

## 2015-05-01 DIAGNOSIS — M545 Low back pain, unspecified: Secondary | ICD-10-CM

## 2015-05-01 DIAGNOSIS — I10 Essential (primary) hypertension: Secondary | ICD-10-CM | POA: Diagnosis not present

## 2015-05-01 MED ORDER — TRAMADOL HCL 50 MG PO TABS: 50.0000 mg | ORAL_TABLET | Freq: Two times a day (BID) | ORAL | Status: DC | PRN

## 2015-05-01 NOTE — Patient Instructions (Signed)
Please take the pain medication as directed. Apply [facility] or [place] to the area. No heavy lifting. No physical therapy until this calms down.  Make sure to take your BP medications when you get home. Follow-up on Monday.

## 2015-05-01 NOTE — Progress Notes (Signed)
Pre visit review using our clinic review tool, if applicable. No additional management support is needed unless otherwise documented below in the visit note. 

## 2015-05-03 NOTE — Assessment & Plan Note (Signed)
Continue temazepam as directed. Rx Tramadol. Supportive measures reviewed. Patient ot follow-up early next week. ER if anything worsens over the weekend.  She is to take all BP medications as directed when she gets home. Discussed importance of medication compliance, especially since pain increases BP

## 2015-05-03 NOTE — Progress Notes (Signed)
Patient presents to clinic today c/o 2 days of acute low back pain bilateral with R > L after having a physical therapy session. Patient denies trauma or injury. Denies radiation of pain into extremities. Has taken her temazepam with some relief in symptoms.   Of note BP significantly elevated today. Patient denies chest pain, palpitations, lightheadedness, dizziness, vision changes or frequent headaches. Patient has not taken BP medications today. Pain is also a component of BP.   Past Medical History  Diagnosis Date  . Schizophrenia ([place])     treated by Dr. Casimiro Needle  . Hypertension   . High cholesterol   . Heart murmur   . Asthma   . Pneumonia ~ 2009  . History of bronchitis     "last time ~ 2009, before I had pneumonia"  . Hypoglycemia   . Migraine     "used to have them often"  . Seizures ([place])     last seizure 3 years ago, not on AED  . Seizure ([place]) 07/07/11  . Stroke ([place]) 07/08/11    "I've had mini strokes before; left side of face  is more down than right "  . Chronic high back pain   . Kidney stone   . Anxiety   . PTSD (post-traumatic stress disorder)   . Double vision   . Hyperparathyroidism ([place])   . Meniscus tear     Right    Current Outpatient Prescriptions on File Prior to Visit  Medication Sig Dispense Refill  . albuterol (PROVENTIL) (2.5 MG/3ML) 0.083% nebulizer solution Take 3 mLs (2.5 mg total) by nebulization every 6 (six) hours as needed for wheezing or shortness of breath. 75 mL 5  . fluticasone (FLONASE) 50 MCG/ACT nasal spray Place 2 sprays into both nostrils daily.    . fluticasone-salmeterol (ADVAIR HFA) 115-21 MCG/ACT inhaler Inhale 1 puff into the lungs 2 (two) times daily. 1 Inhaler 5  . gabapentin (NEURONTIN) 100 MG capsule Take 1 capsule each morning and noon. Take 3 capsules each evening. (Patient taking differently: Take 1 capsule each morning and noon. Take 1 capsules each evening.) 150 capsule 0  . levalbuterol (XOPENEX HFA) 45 MCG/ACT  inhaler INHALE 2 PUFFS EVERY 4-6 HOURS AS NEEDED 1 Inhaler 5  . losartan (COZAAR) 25 MG tablet TAKE ONE TABLET BY MOUTH TWICE DAILY 60 tablet 5  . magnesium 30 MG tablet Take 30 mg by mouth daily. Reported on 03/04/2015    . metoprolol succinate (TOPROL-XL) 50 MG 24 hr tablet Take 1 tablet by mouth daily.    . metoprolol tartrate (LOPRESSOR) 25 MG tablet Take 1 tablet (25 mg total) by mouth daily. 30 tablet 3  . Multiple Vitamin (THERA) TABS Take by mouth.    . orphenadrine (NORFLEX) 100 MG tablet Take 1 tablet (100 mg total) by mouth 2 (two) times daily as needed. 60 tablet 0  . POTASSIUM BICARBONATE PO 395mg  each.  Pt takes 2 tablets daily.    . Potassium Gluconate 595 MG CAPS Take by mouth.    . promethazine (PHENERGAN) 12.5 MG tablet Take 1 tablet (12.5 mg total) by mouth every 6 (six) hours as needed. Reported on 03/04/2015 30 tablet 0  . rosuvastatin (CRESTOR) 20 MG tablet Take 1 tablet (20 mg total) by mouth every evening. 30 tablet 3  . temazepam (RESTORIL) 15 MG capsule Take 30 mg by mouth at bedtime as needed.    . zolpidem (AMBIEN) 10 MG tablet Take 20 mg by mouth at bedtime as  needed.     No current facility-administered medications on file prior to visit.    Allergies  Allergen Reactions  . Sulfa Antibiotics Hives  . Metronidazole Nausea And Vomiting  . Acyclovir And Related   . Benadryl [Diphenhydramine Hcl]     Severe emotional reaction and doesn't work well with Pt. Past history  . Cephalosporins Hives  . Codeine Other (See Comments)    Makes patient feel odd ; "sometimes mild; sometimes severe reaction; mostly severe"  . Diclofenac Sodium Nausea And Vomiting  . Nitrofurantoin Monohyd Macro     Felt funny  . Prednisone Other (See Comments)    migraine  . Tizanidine     Other reaction(s): Other (See Comments) insomnia  . Baclofen Nausea Only  . Meloxicam     Other reaction(s): Confusion  . Sulfamethoxazole Rash    Family History  Problem Relation Age of Onset  .  Heart attack Father 22    Living  . Leukemia Mother 58    Deceased  . Arthritis Father   . Skin cancer Father   . Hypertension Father   . Hyperlipidemia Father   . Alzheimer's disease Paternal Aunt     X2  . Stomach cancer Paternal Aunt     x1  . Arthritis/Rheumatoid Paternal Aunt     x1  . Neuropathy Brother     Peripheal  . Fibromyalgia Sister   . HIV Sister   . Arthritis/Rheumatoid Sister   . Diabetes Paternal Grandmother     Social History   Social History  . Marital Status: Legally Separated    Spouse Name: N/A  . Number of Children: N/A  . Years of Education: N/A   Social History Main Topics  . Smoking status: Former Smoker -- 1.00 packs/day for 20 years    Types: Cigarettes    Quit date: 02/29/2004  . Smokeless tobacco: Never Used  . Alcohol Use: 0.0 oz/week    0 Standard drinks or equivalent per week     Comment: 07/08/11 "glass of wine q once in awhile; not very often; do it on special occasion"  . Drug Use: No  . Sexual Activity: No   Other Topics Concern  . None   Social History Narrative   Completed 2 years of college.  Patient reports living in an apartment by herself, and able to perform all ADL's by herself.  Patient legally separated, 74 y/o son who lives with his father and visits the patient on weekends.  On disability.    Review of Systems - See HPI.  All other ROS are negative.  BP 170/100 mmHg  Pulse 68  Temp(Src) 98.1 F (36.7 C) (Oral)  Ht 5\' 3"  (1.6 m)  Wt 185 lb 3.2 oz (84.006 kg)  BMI 32.81 kg/m2  SpO2 97%  Physical Exam  Constitutional: She is oriented to person, place, and time and well-developed, well-nourished, and in no distress.  HENT:  Head: Normocephalic and atraumatic.  Eyes: Conjunctivae are normal.  Cardiovascular: Normal rate, regular rhythm, normal heart sounds and intact distal pulses.   Pulmonary/Chest: Effort normal and breath sounds normal. No respiratory distress. She has no wheezes. She has no rales. She  exhibits no tenderness.  Musculoskeletal:       Lumbar back: She exhibits tenderness, pain and spasm. She exhibits normal range of motion.  Neurological: She is alert and oriented to person, place, and time.  Skin: Skin is warm and dry. No rash noted.  Psychiatric: Affect normal.  Vitals  reviewed.   Recent Results (from the past 2160 hour(s))  Basic Metabolic Panel (BMET)     Status: Abnormal   Collection Time: 04/20/15  4:29 PM  Result Value Ref Range   Sodium 139 135 - 145 mEq/L   Potassium 4.3 3.5 - 5.1 mEq/L   Chloride 104 96 - 112 mEq/L   CO2 29 19 - 32 mEq/L   Glucose, Bld 110 (H) 70 - 99 mg/dL   BUN 18 6 - 23 mg/dL   Creatinine, Ser 0.70 0.40 - 1.20 mg/dL   Calcium 9.6 8.4 - 10.5 mg/dL   GFR 91.33 >60.00 mL/min  Magnesium     Status: None   Collection Time: 04/20/15  4:29 PM  Result Value Ref Range   Magnesium 2.4 1.5 - 2.5 mg/dL    Assessment/Plan: Acute low back pain Continue temazepam as directed. Rx Tramadol. Supportive measures reviewed. Patient ot follow-up early next week. ER if anything worsens over the weekend.  She is to take all BP medications as directed when she gets home. Discussed importance of medication compliance, especially since pain increases BP

## 2015-05-04 ENCOUNTER — Ambulatory Visit: Payer: 59 | Admitting: Physician Assistant

## 2015-05-04 ENCOUNTER — Telehealth: Payer: Self-pay | Admitting: Physician Assistant

## 2015-05-05 NOTE — Telephone Encounter (Signed)
No charge. 

## 2015-05-05 NOTE — Telephone Encounter (Signed)
Pt came in late for scheduled appt 05/04/15 10:30am - 05/04/15 pt came in at 10:55 she thought her appt was at 10:45, appt was at 10:30 pt had to reschedule GD - pt is scheduled for 05/25/15 - charge or no charge?

## 2015-05-15 ENCOUNTER — Ambulatory Visit: Payer: 59 | Admitting: Neurology

## 2015-05-25 ENCOUNTER — Ambulatory Visit: Payer: 59 | Admitting: Physician Assistant

## 2015-05-25 ENCOUNTER — Telehealth: Payer: Self-pay | Admitting: Physician Assistant

## 2015-05-25 NOTE — Telephone Encounter (Signed)
Patient LVM 05/24/15 at 5:40pm cancelling her 10:15am appointment for today, tried to call patient to [facility] LVM. Charge or no charge

## 2015-05-25 NOTE — Telephone Encounter (Signed)
No charge this time. 

## 2015-05-29 ENCOUNTER — Ambulatory Visit: Payer: 59 | Admitting: Family

## 2015-05-29 ENCOUNTER — Telehealth: Payer: Self-pay | Admitting: Physician Assistant

## 2015-05-29 ENCOUNTER — Ambulatory Visit: Payer: 59 | Admitting: Physician Assistant

## 2015-05-29 ENCOUNTER — Ambulatory Visit (INDEPENDENT_AMBULATORY_CARE_PROVIDER_SITE_OTHER): Payer: 59 | Admitting: Internal Medicine

## 2015-05-29 VITALS — BP 156/108 | HR 84 | Temp 98.7°F | Resp 16 | Wt 176.0 lb

## 2015-05-29 DIAGNOSIS — R030 Elevated blood-pressure reading, without diagnosis of hypertension: Secondary | ICD-10-CM | POA: Diagnosis not present

## 2015-05-29 DIAGNOSIS — R109 Unspecified abdominal pain: Secondary | ICD-10-CM

## 2015-05-29 DIAGNOSIS — IMO0001 Reserved for inherently not codable concepts without codable children: Secondary | ICD-10-CM

## 2015-05-29 DIAGNOSIS — R630 Anorexia: Secondary | ICD-10-CM | POA: Diagnosis not present

## 2015-05-29 NOTE — Progress Notes (Signed)
Pre visit review using our clinic review tool, if applicable. No additional management support is needed unless otherwise documented below in the visit note. 

## 2015-05-29 NOTE — Progress Notes (Signed)
Subjective:    Patient ID: Jasmine [name], female    DOB: 1955-06-06, 60 y.o.   MRN: [id number]  HPI She is here for an acute visit.   05-13-2022 her sister died and she has had increased stress.   Her symptoms started last Friday.  She ate at [place] last Friday afternoon.  At 3am she Started to not feel well - her stomach was aching.  She drank some milk and vomited.  All the next day she vomited multiple times. The vomit was nonbloody.  She had abdominal pain that day that was severe in nature.  She did not have any diarrhea or fever. After Saturday she stopped vomiting but her stomach still hurt.  Since then she has had decreased appetite and has only been drinking - ensure, vitmain water and gatorade. She has had no difficulty drinking those liquids.  She does not feel like eating any solid food.  She has tried to eat something, but got nauseous - just thinking of food makes her nauseous.  Her stomach still hurts at times.  She denies diarrhea.  She has had some constipation.  She denies blood in stool or black stool.    Prior to last week she was having frequent GERD.  She was having it on a daily basis.  She did try taking zantac.  She denies dysphagia.    Medications and allergies reviewed with patient and updated if appropriate.  Patient Active Problem List   Diagnosis Date Noted  . Acute low back pain 05/03/2015  . Bilateral leg pain 04/26/2015  . Dysphagia 03/29/2015  . Myalgia and myositis 03/04/2015  . DJD (degenerative joint disease) of knee 12/09/2014    Current Outpatient Prescriptions on File Prior to Visit  Medication Sig Dispense Refill  . albuterol (PROVENTIL) (2.5 MG/3ML) 0.083% nebulizer solution Take 3 mLs (2.5 mg total) by nebulization every 6 (six) hours as needed for wheezing or shortness of breath. 75 mL 5  . fluticasone (FLONASE) 50 MCG/ACT nasal spray Place 2 sprays into both nostrils daily.    . fluticasone-salmeterol (ADVAIR HFA) 115-21 MCG/ACT  inhaler Inhale 1 puff into the lungs 2 (two) times daily. 1 Inhaler 5  . gabapentin (NEURONTIN) 100 MG capsule Take 1 capsule each morning and noon. Take 3 capsules each evening. (Patient taking differently: Take 1 capsule each morning and noon. Take 1 capsules each evening.) 150 capsule 0  . levalbuterol (XOPENEX HFA) 45 MCG/ACT inhaler INHALE 2 PUFFS EVERY 4-6 HOURS AS NEEDED 1 Inhaler 5  . losartan (COZAAR) 25 MG tablet TAKE ONE TABLET BY MOUTH TWICE DAILY 60 tablet 5  . magnesium 30 MG tablet Take 30 mg by mouth daily. Reported on 03/04/2015    . metoprolol succinate (TOPROL-XL) 50 MG 24 hr tablet Take 1 tablet by mouth daily.    . metoprolol tartrate (LOPRESSOR) 25 MG tablet Take 1 tablet (25 mg total) by mouth daily. 30 tablet 3  . Multiple Vitamin (THERA) TABS Take by mouth.    . orphenadrine (NORFLEX) 100 MG tablet Take 1 tablet (100 mg total) by mouth 2 (two) times daily as needed. 60 tablet 0  . POTASSIUM BICARBONATE PO 395mg  each.  Pt takes 2 tablets daily.    . Potassium Gluconate 595 MG CAPS Take by mouth.    . promethazine (PHENERGAN) 12.5 MG tablet Take 1 tablet (12.5 mg total) by mouth every 6 (six) hours as needed. Reported on 03/04/2015 30 tablet 0  . rosuvastatin (CRESTOR)  20 MG tablet Take 1 tablet (20 mg total) by mouth every evening. 30 tablet 3  . temazepam (RESTORIL) 15 MG capsule Take 30 mg by mouth at bedtime as needed.    . traMADol (ULTRAM) 50 MG tablet Take 1 tablet (50 mg total) by mouth every 12 (twelve) hours as needed. 60 tablet 0  . zolpidem (AMBIEN) 10 MG tablet Take 20 mg by mouth at bedtime as needed.     No current facility-administered medications on file prior to visit.    Past Medical History  Diagnosis Date  . Schizophrenia ([place])     treated by Dr. Casimiro Needle  . Hypertension   . High cholesterol   . Heart murmur   . Asthma   . Pneumonia ~ 2009  . History of bronchitis     "last time ~ 2009, before I had pneumonia"  . Hypoglycemia   . Migraine      "used to have them often"  . Seizures ([place])     last seizure 3 years ago, not on AED  . Seizure ([place]) 07/07/11  . Stroke ([place]) 07/08/11    "I've had mini strokes before; left side of face  is more down than right "  . Chronic high back pain   . Kidney stone   . Anxiety   . PTSD (post-traumatic stress disorder)   . Double vision   . Hyperparathyroidism ([place])   . Meniscus tear     Right    Past Surgical History  Procedure Laterality Date  . Cochlear implant  2010    left  . Tonsillectomy      "as a child"  . Inguinal hernia repair  1985    left  . Kidney stone surgery  ~ 2006  . Wisdom tooth extraction      Social History   Social History  . Marital Status: Legally Separated    Spouse Name: N/A  . Number of Children: N/A  . Years of Education: N/A   Social History Main Topics  . Smoking status: Former Smoker -- 1.00 packs/day for 20 years    Types: Cigarettes    Quit date: 02/29/2004  . Smokeless tobacco: Never Used  . Alcohol Use: 0.0 oz/week    0 Standard drinks or equivalent per week     Comment: 07/08/11 "glass of wine q once in awhile; not very often; do it on special occasion"  . Drug Use: No  . Sexual Activity: No   Other Topics Concern  . Not on file   Social History Narrative   Completed 2 years of college.  Patient reports living in an apartment by herself, and able to perform all ADL's by herself.  Patient legally separated, 21 y/o son who lives with his father and visits the patient on weekends.  On disability.    Family History  Problem Relation Age of Onset  . Heart attack Father 60    Living  . Leukemia Mother 35    Deceased  . Arthritis Father   . Skin cancer Father   . Hypertension Father   . Hyperlipidemia Father   . Alzheimer's disease Paternal Aunt     X2  . Stomach cancer Paternal Aunt     x1  . Arthritis/Rheumatoid Paternal Aunt     x1  . Neuropathy Brother     Peripheal  . Fibromyalgia Sister   . HIV Sister   .  Arthritis/Rheumatoid Sister   . Diabetes Paternal Grandmother  Review of Systems  Constitutional: Positive for appetite change (decreased). Negative for fever.  HENT: Negative for sore throat and trouble swallowing.   Respiratory: Negative for cough, shortness of breath and wheezing.   Cardiovascular: Negative for chest pain.  Gastrointestinal: Positive for nausea, vomiting (last vomited saturday), abdominal pain and constipation. Negative for diarrhea and blood in stool.       GERD  Genitourinary: Negative for dysuria and hematuria.  Neurological: Positive for headaches. Negative for light-headedness.       Objective:   Filed Vitals:   05/29/15 1533  BP: 156/108  Pulse: 84  Temp: 98.7 F (37.1 C)  Resp: 16   Filed Weights   05/29/15 1533  Weight: 176 lb (79.833 kg)   Body mass index is 31.18 kg/(m^2).   Physical Exam  Constitutional: She appears well-developed and well-nourished. No distress.  HENT:  Head: Normocephalic and atraumatic.  Eyes: Conjunctivae are normal.  Neck: Neck supple. No tracheal deviation present. No thyromegaly present.  Cardiovascular: Normal rate, regular rhythm and normal heart sounds.   Pulmonary/Chest: Effort normal and breath sounds normal. No respiratory distress. She has no wheezes. She has no rales.  Abdominal: Soft. She exhibits no distension and no mass. There is tenderness (Throughout upper abdomen, no tenderness in lower abdomen). There is no rebound and no guarding.  Musculoskeletal: She exhibits no edema.  Lymphadenopathy:    She has no cervical adenopathy.  Skin: She is not diaphoretic.        Assessment & Plan:   Abdominal pain, anorexia and nausea with inability to eat solid food, vomiting that has resolved Her upper abdomen is tender on exam - there is no rebound or guarding. She is nauseous and has not been able to eat solid foods for one week She needs blood work and imaging to help differentiate cholecystitis,  diverticulitis, etc We discussed since it is Friday afternoon we would not be able to get a cat scan done until Monday but we could get blood and urine tests done today.  We also discussed her going to the ED today for further evaluation, which she decided to do  She will go to the ED today - she will [facility]  Elevated blood pressure Likely related to acute illness Will follow up with her pcp   Advised follow up with her pcp after the hospital

## 2015-05-29 NOTE — Patient Instructions (Signed)
After our discussion you have decided to go to the ED for further evaluation.

## 2015-05-29 NOTE — Telephone Encounter (Signed)
Pt came in 11:31am, per Angie if pt was late would not be seen, informed pt, she said she thought appt was 11:30am bc she is hard of hearing, pt said she did not want to reschedule and will go to urgent care. Charge or no charge?

## 2015-05-29 NOTE — Telephone Encounter (Signed)
This is 3rd no-show of late cancellation in the past month -- Typically I would charge but I understand her financial difficulties so will refrain from doing so. Please send a letter regarding the number of no-shows and showing up late to appointments.

## 2015-05-30 ENCOUNTER — Ambulatory Visit: Payer: 59 | Admitting: Family Medicine

## 2015-05-31 ENCOUNTER — Encounter: Payer: Self-pay | Admitting: Internal Medicine

## 2015-06-01 ENCOUNTER — Encounter: Payer: Self-pay | Admitting: Physician Assistant

## 2015-06-01 NOTE — Telephone Encounter (Signed)
No charge, mailing letter

## 2015-06-02 ENCOUNTER — Encounter: Payer: Self-pay | Admitting: Physician Assistant

## 2015-06-02 ENCOUNTER — Ambulatory Visit (INDEPENDENT_AMBULATORY_CARE_PROVIDER_SITE_OTHER): Payer: 59 | Admitting: Physician Assistant

## 2015-06-02 VITALS — BP 140/82 | HR 76 | Temp 98.0°F | Ht 63.0 in | Wt 177.0 lb

## 2015-06-02 DIAGNOSIS — R630 Anorexia: Secondary | ICD-10-CM

## 2015-06-02 DIAGNOSIS — K529 Noninfective gastroenteritis and colitis, unspecified: Secondary | ICD-10-CM

## 2015-06-02 LAB — CBC WITH DIFFERENTIAL/PLATELET
Basophils Absolute: 0 10*3/uL (ref 0.0–0.1)
Basophils Relative: 0.4 % (ref 0.0–3.0)
EOS ABS: 0.1 10*3/uL (ref 0.0–0.7)
EOS PCT: 1.1 % (ref 0.0–5.0)
HCT: 41.1 % (ref 36.0–46.0)
Hemoglobin: 13.6 g/dL (ref 12.0–15.0)
LYMPHS ABS: 2.2 10*3/uL (ref 0.7–4.0)
Lymphocytes Relative: 25.4 % (ref 12.0–46.0)
MCHC: 33.1 g/dL (ref 30.0–36.0)
MCV: 84.7 fl (ref 78.0–100.0)
MONO ABS: 0.5 10*3/uL (ref 0.1–1.0)
Monocytes Relative: 5.7 % (ref 3.0–12.0)
NEUTROS PCT: 67.4 % (ref 43.0–77.0)
Neutro Abs: 5.9 10*3/uL (ref 1.4–7.7)
Platelets: 252 10*3/uL (ref 150.0–400.0)
RBC: 4.85 Mil/uL (ref 3.87–5.11)
RDW: 15.5 % (ref 11.5–15.5)
WBC: 8.8 10*3/uL (ref 4.0–10.5)

## 2015-06-02 LAB — COMPREHENSIVE METABOLIC PANEL
ALT: 23 U/L (ref 0–35)
AST: 20 U/L (ref 0–37)
Albumin: 4.4 g/dL (ref 3.5–5.2)
Alkaline Phosphatase: 56 U/L (ref 39–117)
BUN: 11 mg/dL (ref 6–23)
CHLORIDE: 109 meq/L (ref 96–112)
CO2: 29 mEq/L (ref 19–32)
Calcium: 9.8 mg/dL (ref 8.4–10.5)
Creatinine, Ser: 0.61 mg/dL (ref 0.40–1.20)
GFR: 107.01 mL/min (ref >60.00)
GLUCOSE: 106 mg/dL — AB (ref 70–99)
POTASSIUM: 4.3 meq/L (ref 3.5–5.1)
SODIUM: 142 meq/L (ref 135–145)
Total Bilirubin: 0.6 mg/dL (ref 0.2–1.2)
Total Protein: 6.8 g/dL (ref 6.0–8.3)

## 2015-06-02 LAB — H. PYLORI ANTIBODY, IGG: H Pylori IgG: NEGATIVE

## 2015-06-02 MED ORDER — PROMETHAZINE HCL 12.5 MG PO TABS: 12.5000 mg | ORAL_TABLET | Freq: Four times a day (QID) | ORAL | Status: DC | PRN

## 2015-06-02 MED ORDER — ORPHENADRINE CITRATE ER 100 MG PO TB12: 100.0000 mg | ORAL_TABLET | Freq: Two times a day (BID) | ORAL | Status: DC | PRN

## 2015-06-02 NOTE — Progress Notes (Signed)
Pre visit review using our clinic review tool, if applicable. No additional management support is needed unless otherwise documented below in the visit note. 

## 2015-06-02 NOTE — Patient Instructions (Signed)
Please go to the lab for blood work and a stool kit. I will call with results. Please continue good hydration and probiotic. Start a ginger supplement (vitamin section) to help with nausea. I have sent in a prescription for promethazine to help as well.  We will alter your regimen based on results.  Food Choices to Help Relieve Diarrhea, Adult When you have diarrhea, the foods you eat and your eating habits are very important. Choosing the right foods and drinks can help relieve diarrhea. Also, because diarrhea can last up to 7 days, you need to replace lost fluids and electrolytes (such as sodium, potassium, and chloride) in order to help prevent dehydration.  WHAT GENERAL GUIDELINES DO I NEED TO FOLLOW?  Slowly drink 1 cup (8 oz) of fluid for each episode of diarrhea. If you are getting enough fluid, your urine will be clear or pale yellow.  Eat starchy foods. Some good choices include white rice, white toast, pasta, low-fiber cereal, baked potatoes (without the skin), saltine crackers, and bagels.  Avoid large servings of any cooked vegetables.  Limit fruit to two servings per day. A serving is  cup or 1 small piece.  Choose foods with less than 2 g of fiber per serving.  Limit fats to less than 8 tsp (38 g) per day.  Avoid fried foods.  Eat foods that have probiotics in them. Probiotics can be found in certain dairy products.  Avoid foods and beverages that may increase the speed at which food moves through the stomach and intestines (gastrointestinal tract). Things to avoid include:  High-fiber foods, such as dried fruit, raw fruits and vegetables, nuts, seeds, and whole grain foods.  Spicy foods and high-fat foods.  Foods and beverages sweetened with high-fructose corn syrup, honey, or sugar alcohols such as xylitol, sorbitol, and mannitol. WHAT FOODS ARE RECOMMENDED? Grains White rice. White, [place], or pita breads (fresh or toasted), including plain rolls, buns, or bagels.  White pasta. Saltine, soda, or graham crackers. Pretzels. Low-fiber cereal. Cooked cereals made with water (such as cornmeal, farina, or cream cereals). Plain muffins. Matzo. Melba toast. Zwieback.  Vegetables Potatoes (without the skin). Strained tomato and vegetable juices. Most well-cooked and canned vegetables without seeds. Tender lettuce. Fruits Cooked or canned applesauce, apricots, cherries, fruit cocktail, grapefruit, peaches, pears, or plums. Fresh bananas, apples without skin, cherries, grapes, cantaloupe, grapefruit, peaches, oranges, or plums.  Meat and Other Protein Products Baked or boiled chicken. Eggs. Tofu. Fish. Seafood. Smooth peanut butter. Ground or well-cooked tender beef, ham, veal, lamb, pork, or poultry.  Dairy Plain yogurt, kefir, and unsweetened liquid yogurt. Lactose-free milk, buttermilk, or soy milk. Plain hard cheese. Beverages Sport drinks. Clear broths. Diluted fruit juices (except prune). Regular, caffeine-free sodas such as ginger ale. Water. Decaffeinated teas. Oral rehydration solutions. Sugar-free beverages not sweetened with sugar alcohols. Other Bouillon, broth, or soups made from recommended foods.  The items listed above may not be a complete list of recommended foods or beverages. Contact your dietitian for more options. WHAT FOODS ARE NOT RECOMMENDED? Grains Whole grain, whole wheat, bran, or rye breads, rolls, pastas, crackers, and cereals. Wild or brown rice. Cereals that contain more than 2 g of fiber per serving. Corn tortillas or taco shells. Cooked or dry oatmeal. Granola. Popcorn. Vegetables Raw vegetables. Cabbage, broccoli, Brussels sprouts, artichokes, baked beans, beet greens, corn, kale, legumes, peas, sweet potatoes, and yams. Potato skins. Cooked spinach and cabbage. Fruits Dried fruit, including raisins and dates. Raw fruits. Stewed or dried  prunes. Fresh apples with skin, apricots, mangoes, pears, raspberries, and strawberries.  Meat  and Other Protein Products Chunky peanut butter. Nuts and seeds. Beans and lentils. Berniece Salines.  Dairy High-fat cheeses. Milk, chocolate milk, and beverages made with milk, such as milk shakes. Cream. Ice cream. Sweets and Desserts Sweet rolls, doughnuts, and sweet breads. Pancakes and waffles. Fats and Oils Butter. Cream sauces. Margarine. Salad oils. Plain salad dressings. Olives. Avocados.  Beverages Caffeinated beverages (such as coffee, tea, soda, or energy drinks). Alcoholic beverages. Fruit juices with pulp. Prune juice. Soft drinks sweetened with high-fructose corn syrup or sugar alcohols. Other Coconut. Hot sauce. Chili powder. Mayonnaise. Gravy. Cream-based or milk-based soups.  The items listed above may not be a complete list of foods and beverages to avoid. Contact your dietitian for more information. WHAT SHOULD I DO IF I BECOME DEHYDRATED? Diarrhea can sometimes lead to dehydration. Signs of dehydration include dark urine and dry mouth and skin. If you think you are dehydrated, you should rehydrate with an oral rehydration solution. These solutions can be purchased at pharmacies, retail stores, or online.  Drink -1 cup (120-240 mL) of oral rehydration solution each time you have an episode of diarrhea. If drinking this amount makes your diarrhea worse, try drinking smaller amounts more often. For example, drink 1-3 tsp (5-15 mL) every 5-10 minutes.  A general rule for staying hydrated is to drink 1-2 L of fluid per day. Talk to your health care provider about the specific amount you should be drinking each day. Drink enough fluids to keep your urine clear or pale yellow.   This information is not intended to replace advice given to you by your health care provider. Make sure you discuss any questions you have with your health care provider.   Document Released: 05/07/2003 Document Revised: 03/07/2014 Document Reviewed: 01/07/2013 Elsevier Interactive Patient Education [employer].

## 2015-06-02 NOTE — Progress Notes (Signed)
Patient presents to clinic today c/o eating a large meal at [place] 2 weekends ago. Endorses after that having vomiting an diarrhea for 24 hours that have resolved. Endorses since then she has noted anorexia and nausea with meals. Denies acid reflux or heartburn symptoms at present. Endorses regular BM but yesterday had a formed stool that was gray. Denies fever, chills.    Past Medical History  Diagnosis Date  . Schizophrenia ([place])     treated by Dr. Casimiro Needle  . Hypertension   . High cholesterol   . Heart murmur   . Asthma   . Pneumonia ~ 2009  . History of bronchitis     "last time ~ 2009, before I had pneumonia"  . Hypoglycemia   . Migraine     "used to have them often"  . Seizures ([place])     last seizure 3 years ago, not on AED  . Seizure ([place]) 07/07/11  . Stroke ([place]) 07/08/11    "I've had mini strokes before; left side of face  is more down than right "  . Chronic high back pain   . Kidney stone   . Anxiety   . PTSD (post-traumatic stress disorder)   . Double vision   . Hyperparathyroidism ([place])   . Meniscus tear     Right    Current Outpatient Prescriptions on File Prior to Visit  Medication Sig Dispense Refill  . albuterol (PROVENTIL) (2.5 MG/3ML) 0.083% nebulizer solution Take 3 mLs (2.5 mg total) by nebulization every 6 (six) hours as needed for wheezing or shortness of breath. 75 mL 5  . fluticasone (FLONASE) 50 MCG/ACT nasal spray Place 2 sprays into both nostrils daily.    . fluticasone-salmeterol (ADVAIR HFA) 115-21 MCG/ACT inhaler Inhale 1 puff into the lungs 2 (two) times daily. 1 Inhaler 5  . gabapentin (NEURONTIN) 100 MG capsule Take 1 capsule each morning and noon. Take 3 capsules each evening. (Patient taking differently: Take 1 capsule each morning and noon. Take 1 capsules each evening.) 150 capsule 0  . levalbuterol (XOPENEX HFA) 45 MCG/ACT inhaler INHALE 2 PUFFS EVERY 4-6 HOURS AS NEEDED 1 Inhaler 5  . losartan (COZAAR) 25 MG tablet TAKE ONE TABLET BY  MOUTH TWICE DAILY 60 tablet 5  . magnesium 30 MG tablet Take 30 mg by mouth daily. Reported on 03/04/2015    . metoprolol succinate (TOPROL-XL) 50 MG 24 hr tablet Take 1 tablet by mouth daily.    . metoprolol tartrate (LOPRESSOR) 25 MG tablet Take 1 tablet (25 mg total) by mouth daily. 30 tablet 3  . Multiple Vitamin (THERA) TABS Take by mouth.    Marland Kitchen POTASSIUM BICARBONATE PO 395mg  each.  Pt takes 2 tablets daily.    . Potassium Gluconate 595 MG CAPS Take by mouth.    . rosuvastatin (CRESTOR) 20 MG tablet Take 1 tablet (20 mg total) by mouth every evening. 30 tablet 3  . temazepam (RESTORIL) 15 MG capsule Take 30 mg by mouth at bedtime as needed.    . zolpidem (AMBIEN) 10 MG tablet Take 20 mg by mouth at bedtime as needed.     No current facility-administered medications on file prior to visit.    Allergies  Allergen Reactions  . Sulfa Antibiotics Hives  . Metronidazole Nausea And Vomiting  . Acyclovir And Related   . Benadryl [Diphenhydramine Hcl]     Severe emotional reaction and doesn't work well with Pt. Past history  . Cephalosporins Hives  . Codeine Other (  See Comments)    Makes patient feel odd ; "sometimes mild; sometimes severe reaction; mostly severe"  . Diclofenac Sodium Nausea And Vomiting  . Nitrofurantoin Monohyd Macro     Felt funny  . Prednisone Other (See Comments)    migraine  . Tizanidine     Other reaction(s): Other (See Comments) insomnia  . Baclofen Nausea Only  . Meloxicam     Other reaction(s): Confusion  . Sulfamethoxazole Rash    Family History  Problem Relation Age of Onset  . Heart attack Father 63    Living  . Leukemia Mother 2    Deceased  . Arthritis Father   . Skin cancer Father   . Hypertension Father   . Hyperlipidemia Father   . Alzheimer's disease Paternal Aunt     X2  . Stomach cancer Paternal Aunt     x1  . Arthritis/Rheumatoid Paternal Aunt     x1  . Neuropathy Brother     Peripheal  . Fibromyalgia Sister   . HIV Sister   .  Arthritis/Rheumatoid Sister   . Diabetes Paternal Grandmother     Social History   Social History  . Marital Status: Legally Separated    Spouse Name: N/A  . Number of Children: N/A  . Years of Education: N/A   Social History Main Topics  . Smoking status: Former Smoker -- 1.00 packs/day for 20 years    Types: Cigarettes    Quit date: 02/29/2004  . Smokeless tobacco: Never Used  . Alcohol Use: 0.0 oz/week    0 Standard drinks or equivalent per week     Comment: 07/08/11 "glass of wine q once in awhile; not very often; do it on special occasion"  . Drug Use: No  . Sexual Activity: No   Other Topics Concern  . None   Social History Narrative   Completed 2 years of college.  Patient reports living in an apartment by herself, and able to perform all ADL's by herself.  Patient legally separated, 51 y/o son who lives with his father and visits the patient on weekends.  On disability.    Review of Systems - See HPI.  All other ROS are negative.  BP 140/82 mmHg  Pulse 76  Temp(Src) 98 F (36.7 C) (Oral)  Ht 5\' 3"  (1.6 m)  Wt 177 lb (80.287 kg)  BMI 31.36 kg/m2  SpO2 99%  Physical Exam  Constitutional: She is oriented to person, place, and time and well-developed, well-nourished, and in no distress.  HENT:  Head: Normocephalic and atraumatic.  Cardiovascular: Normal rate, regular rhythm, normal heart sounds and intact distal pulses.   Pulmonary/Chest: Effort normal and breath sounds normal. No respiratory distress. She has no wheezes. She has no rales. She exhibits no tenderness.  Abdominal: Soft. Bowel sounds are normal. She exhibits no distension and no mass. There is no tenderness. There is no rebound and no guarding.  Neurological: She is alert and oriented to person, place, and time.  Skin: Skin is warm and dry. No rash noted.  Psychiatric: Affect normal.  Vitals reviewed.   Recent Results (from the past 2160 hour(s))  Basic Metabolic Panel (BMET)     Status:  Abnormal   Collection Time: 04/20/15  4:29 PM  Result Value Ref Range   Sodium 139 135 - 145 mEq/L   Potassium 4.3 3.5 - 5.1 mEq/L   Chloride 104 96 - 112 mEq/L   CO2 29 19 - 32 mEq/L   Glucose, Bld  110 (H) 70 - 99 mg/dL   BUN 18 6 - 23 mg/dL   Creatinine, Ser 0.70 0.40 - 1.20 mg/dL   Calcium 9.6 8.4 - 10.5 mg/dL   GFR 91.33 >60.00 mL/min  Magnesium     Status: None   Collection Time: 04/20/15  4:29 PM  Result Value Ref Range   Magnesium 2.4 1.5 - 2.5 mg/dL    Assessment/Plan: Anorexia With nausea s/p episode of food poisoning. No residual emesis or loose stools. Examination within normal limits. Will check lab panel and stool studies due to change in appearance of stool. Supportive measures discussed. Rx promethaZine for nausea.

## 2015-06-02 NOTE — Assessment & Plan Note (Signed)
With nausea s/p episode of food poisoning. No residual emesis or loose stools. Examination within normal limits. Will check lab panel and stool studies due to change in appearance of stool. Supportive measures discussed. Rx promethaZine for nausea.

## 2015-06-03 ENCOUNTER — Other Ambulatory Visit: Payer: 59

## 2015-06-03 ENCOUNTER — Other Ambulatory Visit: Payer: Self-pay | Admitting: Physician Assistant

## 2015-06-05 ENCOUNTER — Telehealth: Payer: Self-pay | Admitting: *Deleted

## 2015-06-05 ENCOUNTER — Ambulatory Visit: Payer: 59 | Admitting: Neurology

## 2015-06-05 NOTE — Telephone Encounter (Signed)
[facility] note received via fax  Call:   Date: 06/04/15 Time: 2016   Caller: Husband Return number: [phone]  Nurse: Margarito Courser, RN Chief Complaint: Headache  Reason for call: Caller states that his wife is having a sore throat and a headache with weakness. May have bronchitis.   Related visit to physician within the last 2 weeks: Yes  Guideline: Cough-Acute Non-productive; SEVERE coughing spells (e.g., whooping sound after coughing, vomiting after coughing)  Disposition: See Physician within 24 Hours  **Called pt and LMOM to call office to schedule appt**

## 2015-06-07 LAB — STOOL CULTURE

## 2015-06-08 ENCOUNTER — Encounter: Payer: Self-pay | Admitting: *Deleted

## 2015-06-11 ENCOUNTER — Telehealth: Payer: Self-pay

## 2015-06-11 DIAGNOSIS — R110 Nausea: Secondary | ICD-10-CM

## 2015-06-11 DIAGNOSIS — R630 Anorexia: Secondary | ICD-10-CM

## 2015-06-11 NOTE — Telephone Encounter (Signed)
Discussed lab results with patient.  She stated understanding, and asked if she could receive a referral to GI.   She says that while her symptoms have improved, she's still unable to eat certain foods that she was able to eat before (i.e. pasta, meat, etc).  States when she does, it causes pain.  Pt says all she has been able to eat lately is mashed potatoes, peas, and corn.    Please advise.

## 2015-06-11 NOTE — Telephone Encounter (Signed)
Thank you for taking care of this.

## 2015-06-11 NOTE — Telephone Encounter (Addendum)
I have placed a referral. Please inform patient.   Thank you Ashlee.

## 2015-06-11 NOTE — Telephone Encounter (Signed)
Called and left message on patient's voice mail informing her that the referral has been placed.  She was encouraged to call back with questions or concerns.

## 2015-06-11 NOTE — Telephone Encounter (Signed)
Patient came in wanting to talk with office manger about no show letters she received. She was upset that she was classified as a no show when she called day before and left message, she then came in one of the appointment at the wrong time she was sent to [facility] then appointment was canceled.  To accommodate the patient I told her I would change the appointments to canceled and notify the provider of her concerns. I did go over the attendance policy and I gave her my card to have my contact information if she had another concerns. She then wanted to know the results of the stool sample was concerned she had not heard from [place] with results. I told her that the letter was mailed to her 10th maybe she would get it today. I did provider her a copy and reached out to RN team lead to explain them to her. Patient was satisfied when she left my presences.

## 2015-06-14 LAB — HM COLONOSCOPY

## 2015-07-23 ENCOUNTER — Other Ambulatory Visit: Payer: Self-pay | Admitting: Physician Assistant

## 2015-07-23 MED ORDER — ORPHENADRINE CITRATE ER 100 MG PO TB12: 100.0000 mg | ORAL_TABLET | Freq: Two times a day (BID) | ORAL | Status: DC

## 2015-07-23 NOTE — Addendum Note (Signed)
Addended by: Anabel Halon on: 07/23/2015 07:07 PM   Modules accepted: Orders

## 2015-07-23 NOTE — Telephone Encounter (Signed)
Gave limited refill since I have never seen her and covering for [employer] PA-C

## 2015-07-23 NOTE — Telephone Encounter (Signed)
Pt is requesting refill on Norflex 100 mg tab.  Last OV: 06/02/2015 Last Fill: 06/02/2015 #60 and 0RF   Please advise.

## 2015-07-23 NOTE — Telephone Encounter (Signed)
Opened to refill. Norflex. But could not find when it was written

## 2015-07-24 ENCOUNTER — Ambulatory Visit (INDEPENDENT_AMBULATORY_CARE_PROVIDER_SITE_OTHER): Payer: 59 | Admitting: Physician Assistant

## 2015-07-24 ENCOUNTER — Encounter: Payer: Self-pay | Admitting: Physician Assistant

## 2015-07-24 VITALS — BP 126/74 | HR 60 | Temp 97.8°F | Ht 63.0 in | Wt 182.2 lb

## 2015-07-24 DIAGNOSIS — M62838 Other muscle spasm: Secondary | ICD-10-CM

## 2015-07-24 MED ORDER — CARISOPRODOL 350 MG PO TABS: 350.0000 mg | ORAL_TABLET | Freq: Every day | ORAL | Status: DC

## 2015-07-24 MED ORDER — CYCLOBENZAPRINE HCL 10 MG PO TABS: 10.0000 mg | ORAL_TABLET | Freq: Every day | ORAL | Status: DC

## 2015-07-24 NOTE — Progress Notes (Signed)
Patient presents to clinic today c/o recurrence of muscle spasms of arms, legs and lower back. Denies change to diet or exercise. Is staying well hydrated. Is taking chronic medications as directed. Denies new medicine or supplements. Denies trauma or injury. Denies increase in stress.  Past Medical History  Diagnosis Date  . Schizophrenia ([place])     treated by Dr. Casimiro Needle  . Hypertension   . High cholesterol   . Heart murmur   . Asthma   . Pneumonia ~ 2009  . History of bronchitis     "last time ~ 2009, before I had pneumonia"  . Hypoglycemia   . Migraine     "used to have them often"  . Seizures ([place])     last seizure 3 years ago, not on AED  . Seizure ([place]) 07/07/11  . Stroke ([place]) 07/08/11    "I've had mini strokes before; left side of face  is more down than right "  . Chronic high back pain   . Kidney stone   . Anxiety   . PTSD (post-traumatic stress disorder)   . Double vision   . Hyperparathyroidism ([place])   . Meniscus tear     Right    Current Outpatient Prescriptions on File Prior to Visit  Medication Sig Dispense Refill  . albuterol (PROVENTIL) (2.5 MG/3ML) 0.083% nebulizer solution Take 3 mLs (2.5 mg total) by nebulization every 6 (six) hours as needed for wheezing or shortness of breath. 75 mL 5  . fluticasone (FLONASE) 50 MCG/ACT nasal spray Place 2 sprays into both nostrils daily.    . fluticasone-salmeterol (ADVAIR HFA) 115-21 MCG/ACT inhaler Inhale 1 puff into the lungs 2 (two) times daily. 1 Inhaler 5  . gabapentin (NEURONTIN) 100 MG capsule TAKE ONE CAPSULE BY MOUTH ONCE DAILY IN THE MORNING AND ONE CAPSULE AT NOON AND THREE CAPSULES ONCE DAILY IN THE EVENING 150 capsule 0  . levalbuterol (XOPENEX HFA) 45 MCG/ACT inhaler INHALE 2 PUFFS EVERY 4-6 HOURS AS NEEDED 1 Inhaler 5  . losartan (COZAAR) 25 MG tablet TAKE ONE TABLET BY MOUTH TWICE DAILY 60 tablet 5  . metoprolol succinate (TOPROL-XL) 50 MG 24 hr tablet Take 1 tablet by mouth daily.    . metoprolol  tartrate (LOPRESSOR) 25 MG tablet Take 1 tablet (25 mg total) by mouth daily. (Patient taking differently: Take 50 mg by mouth daily. ) 30 tablet 3  . Multiple Vitamin (THERA) TABS Take by mouth.    . orphenadrine (NORFLEX) 100 MG tablet Take 1 tablet (100 mg total) by mouth 2 (two) times daily. 30 tablet 0  . POTASSIUM BICARBONATE PO 333m each.  Pt takes 2 tablets daily.    . promethazine (PHENERGAN) 12.5 MG tablet Take 1 tablet (12.5 mg total) by mouth every 6 (six) hours as needed. Reported on 03/04/2015 30 tablet 0  . rosuvastatin (CRESTOR) 20 MG tablet Take 1 tablet (20 mg total) by mouth every evening. 30 tablet 3  . temazepam (RESTORIL) 15 MG capsule Take 30 mg by mouth at bedtime as needed.    . zolpidem (AMBIEN) 10 MG tablet Take 20 mg by mouth at bedtime as needed.    . magnesium 30 MG tablet Take 30 mg by mouth daily. Reported on 07/24/2015    . Potassium Gluconate 595 MG CAPS Take by mouth. Reported on 07/24/2015     No current facility-administered medications on file prior to visit.    Allergies  Allergen Reactions  . Sulfa Antibiotics Hives  .  Metronidazole Nausea And Vomiting  . Acyclovir And Related   . Benadryl [Diphenhydramine Hcl]     Severe emotional reaction and doesn't work well with Pt. Past history  . Cephalosporins Hives  . Codeine Other (See Comments)    Makes patient feel odd ; "sometimes mild; sometimes severe reaction; mostly severe"  . Diclofenac Sodium Nausea And Vomiting  . Nitrofurantoin Monohyd Macro     Felt funny  . Prednisone Other (See Comments)    migraine  . Tizanidine     Other reaction(s): Other (See Comments) insomnia  . Baclofen Nausea Only  . Meloxicam     Other reaction(s): Confusion  . Sulfamethoxazole Rash    Family History  Problem Relation Age of Onset  . Heart attack Father 3    Living  . Leukemia Mother 32    Deceased  . Arthritis Father   . Skin cancer Father   . Hypertension Father   . Hyperlipidemia Father   .  Alzheimer's disease Paternal Aunt     X2  . Stomach cancer Paternal Aunt     x1  . Arthritis/Rheumatoid Paternal Aunt     x1  . Neuropathy Brother     Peripheal  . Fibromyalgia Sister   . HIV Sister   . Arthritis/Rheumatoid Sister   . Diabetes Paternal Grandmother     Social History   Social History  . Marital Status: Legally Separated    Spouse Name: N/A  . Number of Children: N/A  . Years of Education: N/A   Social History Main Topics  . Smoking status: Former Smoker -- 1.00 packs/day for 20 years    Types: Cigarettes    Quit date: 02/29/2004  . Smokeless tobacco: Never Used  . Alcohol Use: 0.0 oz/week    0 Standard drinks or equivalent per week     Comment: 07/08/11 "glass of wine q once in awhile; not very often; do it on special occasion"  . Drug Use: No  . Sexual Activity: No   Other Topics Concern  . None   Social History Narrative   Completed 2 years of college.  Patient reports living in an apartment by herself, and able to perform all ADL's by herself.  Patient legally separated, 16 y/o son who lives with his father and visits the patient on weekends.  On disability.    Review of Systems - See HPI.  All other ROS are negative.  BP 126/74 mmHg  Pulse 60  Temp(Src) 97.8 F (36.6 C) (Oral)  Ht 5' 3"  (1.6 m)  Wt 182 lb 4 oz (82.668 kg)  BMI 32.29 kg/m2  SpO2 96%  Physical Exam  Constitutional: She is oriented to person, place, and time and well-developed, well-nourished, and in no distress.  HENT:  Head: Normocephalic and atraumatic.  Eyes: Conjunctivae are normal.  Neck: Neck supple.  Cardiovascular: Normal rate, regular rhythm, normal heart sounds and intact distal pulses.   Pulmonary/Chest: Effort normal and breath sounds normal. No respiratory distress. She has no wheezes. She has no rales. She exhibits no tenderness.  Musculoskeletal: Normal range of motion. She exhibits no edema.  Neurological: She is alert and oriented to person, place, and  time.  Skin: Skin is warm and dry.  Vitals reviewed.   Recent Results (from the past 2160 hour(s))  CBC w/Diff     Status: None   Collection Time: 06/02/15 11:23 AM  Result Value Ref Range   WBC 8.8 4.0 - 10.5 K/uL   RBC 4.85  3.87 - 5.11 Mil/uL   Hemoglobin 13.6 12.0 - 15.0 g/dL   HCT 41.1 36.0 - 46.0 %   MCV 84.7 78.0 - 100.0 fl   MCHC 33.1 30.0 - 36.0 g/dL   RDW 15.5 11.5 - 15.5 %   Platelets 252.0 150.0 - 400.0 K/uL   Neutrophils Relative % 67.4 43.0 - 77.0 %   Lymphocytes Relative 25.4 12.0 - 46.0 %   Monocytes Relative 5.7 3.0 - 12.0 %   Eosinophils Relative 1.1 0.0 - 5.0 %   Basophils Relative 0.4 0.0 - 3.0 %   Neutro Abs 5.9 1.4 - 7.7 K/uL   Lymphs Abs 2.2 0.7 - 4.0 K/uL   Monocytes Absolute 0.5 0.1 - 1.0 K/uL   Eosinophils Absolute 0.1 0.0 - 0.7 K/uL   Basophils Absolute 0.0 0.0 - 0.1 K/uL  Comp Met (CMET)     Status: Abnormal   Collection Time: 06/02/15 11:23 AM  Result Value Ref Range   Sodium 142 135 - 145 mEq/L   Potassium 4.3 3.5 - 5.1 mEq/L   Chloride 109 96 - 112 mEq/L   CO2 29 19 - 32 mEq/L   Glucose, Bld 106 (H) 70 - 99 mg/dL   BUN 11 6 - 23 mg/dL   Creatinine, Ser 0.61 0.40 - 1.20 mg/dL   Total Bilirubin 0.6 0.2 - 1.2 mg/dL   Alkaline Phosphatase 56 39 - 117 U/L   AST 20 0 - 37 U/L   ALT 23 0 - 35 U/L   Total Protein 6.8 6.0 - 8.3 g/dL   Albumin 4.4 3.5 - 5.2 g/dL   Calcium 9.8 8.4 - 10.5 mg/dL   GFR 107.01 >60.00 mL/min  H. pylori antibody, IgG     Status: None   Collection Time: 06/02/15 11:23 AM  Result Value Ref Range   H Pylori IgG Negative Negative  Stool Culture     Status: None   Collection Time: 06/03/15 10:32 AM  Result Value Ref Range   Organism ID, Bacteria No Salmonella,Shigella,Campylobacter,Yersinia,or    Organism ID, Bacteria No E.coli [id number]:H7 isolated.     Assessment/Plan: 1. Muscle spasms of both lower extremities Will continue Restoril at bedtime. Will stop Norflex and begin Flexeril. Patient to start B Complex MTV.  Increase potassium and magnesium intake. FU if not improving.

## 2015-07-24 NOTE — Patient Instructions (Signed)
Take the Norflex in the morning/daytime and the Soma in the evening. Start a multivitamin and a B Complex vitamin supplement. Icy Hot to the neck. You can apply heating pad to the area for 10 minutes, a couple of times per day.  Follow-up with me in 2 weeks.

## 2015-07-24 NOTE — Progress Notes (Signed)
Pre visit review using our clinic review tool, if applicable. No additional management support is needed unless otherwise documented below in the visit note. 

## 2015-07-31 ENCOUNTER — Telehealth: Payer: Self-pay | Admitting: Physician Assistant

## 2015-07-31 DIAGNOSIS — G629 Polyneuropathy, unspecified: Secondary | ICD-10-CM

## 2015-07-31 DIAGNOSIS — R202 Paresthesia of skin: Secondary | ICD-10-CM

## 2015-07-31 NOTE — Telephone Encounter (Signed)
Can be reached: [phone]   Reason for call: Pt states she talked to [facility] about 2 weeks ago for referral to Neurology. She said that she called someone but didn't think it was who Cody recommended. What is the name of the Neurologist he is recommending her to see? Please call pt.

## 2015-07-31 NOTE — Telephone Encounter (Signed)
Ok to place Neurology referral again -- same diagnosis. Send message to Delsa Sale to fax all info, imaging.

## 2015-07-31 NOTE — Telephone Encounter (Signed)
Patient informed/SLS 06/02

## 2015-07-31 NOTE — Telephone Encounter (Signed)
Patient states neuro referral expired in need of recent CT results, and recent office notes please fax to neurologist, please advise

## 2015-07-31 NOTE — Telephone Encounter (Signed)
Please Advise

## 2015-07-31 NOTE — Addendum Note (Signed)
Addended by: Rockwell Germany on: 07/31/2015 02:18 PM   Modules accepted: Orders

## 2015-07-31 NOTE — Telephone Encounter (Signed)
Referral was placed in February to [facility]. Would be one of their providers, all are fantastic.

## 2015-08-03 ENCOUNTER — Telehealth: Payer: Self-pay | Admitting: Physician Assistant

## 2015-08-03 DIAGNOSIS — R937 Abnormal findings on diagnostic imaging of other parts of musculoskeletal system: Secondary | ICD-10-CM

## 2015-08-03 NOTE — Telephone Encounter (Signed)
Patient had previously called regarding provider for neurology referral. There was some confusion in response as we were looking at old Neurology referral.  She was being set up with Neurosurgery (referral placed). I had mentioned my goal was to get the patient in with Dr. Ellene Route. Hopefully they will be able to get her in with him.

## 2015-08-04 NOTE — Telephone Encounter (Signed)
Patient informed, understood & agreed/SLS 06/06

## 2015-08-06 ENCOUNTER — Telehealth: Payer: Self-pay | Admitting: Physician Assistant

## 2015-08-06 NOTE — Telephone Encounter (Signed)
Because of the patient uncertainty I feel like I will leave this for primary provider. Have forwarded so he can address upon return

## 2015-08-06 NOTE — Telephone Encounter (Signed)
Pt notified that PCP will address medication requests when he returns to office on 08/07/15.

## 2015-08-06 NOTE — Telephone Encounter (Signed)
Pt left empty bottle of the medication she's requesting.  Bottle indicates Metoprolol ER 50 mg.  Confirmed with patient that this was the medication she needs a refill on.    Called pharmacy, per Merrily Pew, pharm tech, Metoprolol ER 50 mg was prescribed back in 2016, but was not filled until March, 2017.  He says script for Metoprolol ER 50 mg has expired, but pt insists that she needs his tmed, because it works best.  He also says that pt also filled Metoprolol tartrate  (Lopressor) 25 mg this year, and therefore maybe taking both.    Called patient to discussed the above.  She stated understanding and says that's correct.  That she takes 75 mg daily and have been taking it that way for over 2 years.  She says that [name] is aware and is not sure why the pharmacy is giving her such a hard time.    Pt would also like a refill on Tramadol, last filed: 05/01/15; Amt: 60, 0; No contract or UDS on file.  Please advise.

## 2015-08-06 NOTE — Telephone Encounter (Signed)
Pt is needing refill on rx metoprolol tartrate (LOPRESSOR) 25 MG tablet, pt states the rx has always been prescribed by Jasmine [name] however the pharmacy informed her that it was prescribed by a doctor that she has not seen in years. Pt states she is completely out of this medicine and can not function without it. Also pt is requesting a refill on rx tramadol.

## 2015-08-06 NOTE — Telephone Encounter (Signed)
Noted  

## 2015-08-07 ENCOUNTER — Ambulatory Visit (INDEPENDENT_AMBULATORY_CARE_PROVIDER_SITE_OTHER): Payer: 59 | Admitting: Neurology

## 2015-08-07 ENCOUNTER — Telehealth: Payer: Self-pay | Admitting: Neurology

## 2015-08-07 ENCOUNTER — Encounter: Payer: Self-pay | Admitting: *Deleted

## 2015-08-07 ENCOUNTER — Encounter: Payer: Self-pay | Admitting: Neurology

## 2015-08-07 VITALS — BP 146/79 | HR 62 | Ht 63.0 in | Wt 180.0 lb

## 2015-08-07 DIAGNOSIS — M542 Cervicalgia: Secondary | ICD-10-CM

## 2015-08-07 DIAGNOSIS — G43909 Migraine, unspecified, not intractable, without status migrainosus: Secondary | ICD-10-CM

## 2015-08-07 DIAGNOSIS — G43709 Chronic migraine without aura, not intractable, without status migrainosus: Secondary | ICD-10-CM

## 2015-08-07 HISTORY — DX: Migraine, unspecified, not intractable, without status migrainosus: G43.909

## 2015-08-07 MED ORDER — TRAMADOL HCL 50 MG PO TABS: 50.0000 mg | ORAL_TABLET | Freq: Two times a day (BID) | ORAL | Status: DC | PRN

## 2015-08-07 MED ORDER — METOPROLOL SUCCINATE ER 25 MG PO TB24: 75.0000 mg | ORAL_TABLET | Freq: Every day | ORAL | Status: DC

## 2015-08-07 MED ORDER — PROMETHAZINE HCL 12.5 MG PO TABS: 12.5000 mg | ORAL_TABLET | Freq: Four times a day (QID) | ORAL | Status: DC | PRN

## 2015-08-07 NOTE — Telephone Encounter (Signed)
Pt called in returning the nurse call.    CB: [phone]

## 2015-08-07 NOTE — Telephone Encounter (Signed)
Pt called said Ativan is 1mg .

## 2015-08-07 NOTE — Telephone Encounter (Signed)
Pt walked into clinic with husband.  States it was easier for her to come in rather than discuss information over the phone due hearing impairment.  Provider's recommendations discussed.  Pt stated understanding and is in agreement with plan. Rx sent to pharmacy and follow up appt scheduled.  Pt was advised to check blood pressures regularly in between visits and to call office if blood pressures are too high or too low.  She agreed to comply.    Pt again requested refill on Tramadol.  Verbal order given to refill med same dose, frequency, and quantity.  Med phoned in per verbal order.    No additional needs voiced.

## 2015-08-07 NOTE — Telephone Encounter (Signed)
Pt called in after being seen for an office visit and noticied a medication listed that she does not take . Please call pt [phone]

## 2015-08-07 NOTE — Progress Notes (Signed)
Reason for visit: Headache  Referring physician: Dr. Genia Harold is a 60 y.o. female  History of present illness:  Jasmine [name] is a 60 year old left-handed white female with a history of fibromyalgia and a prior history of migraine earlier in her life. The patient has not had any headaches for about 15 years, she went to the emergency room at [facility] on 07/10/2015 with onset of a headache associated with loss of consciousness. The patient was laying in bed and she had a relatively rapid onset of headache on the top of her head. She got up to go the bathroom, she felt woozy, and she briefly lost consciousness. The patient was caught by her husband, she did not fall and hit the floor. The patient was unconscious only for a few seconds. The patient had a severe headache associated with some nausea, she went to the emergency room for an evaluation. MRI could not be done as she has a cochlear implant. A CT angiogram did not show evidence of an aneurysm. There was a mild enlargement of a portion of the pericallosal artery. The patient has not had any headache problems since that time, but she indicates that her neck and shoulders are somewhat stiff. She is having some nausea off and on, but she was having significant abdominal complaints for about 5 weeks prior to the emergency room visit where she was not eating well and she had significant nausea. She has been taking Phenergan. Since the emergency room visit, her appetite has returned, but she still has some bouts of nausea. The patient reports some blurring of vision. She was placed on Flexeril, but she could not tolerate the medication. She has an allergy to tizanidine. She reports some neck and low back pain associated with her fibromyalgia, but her current discomfort is more than usual. She denies any weakness or numbness of the extremities, she denies any balance issues or difficulty controlling the bowels or the bladder. She is sent to this  office for an evaluation.  Past Medical History  Diagnosis Date  . Schizophrenia ([place])     treated by Dr. Casimiro Needle  . Hypertension   . High cholesterol   . Heart murmur   . Asthma   . Pneumonia ~ 2009  . History of bronchitis     "last time ~ 2009, before I had pneumonia"  . Hypoglycemia   . Migraine     "used to have them often"  . Seizures ([place])     last seizure 3 years ago, not on AED  . Seizure ([place]) 07/07/11  . Stroke ([place]) 07/08/11    "I've had mini strokes before; left side of face  is more down than right "  . Chronic high back pain   . Kidney stone   . Anxiety   . PTSD (post-traumatic stress disorder)   . Double vision   . Hyperparathyroidism ([place])   . Meniscus tear     Right  . Syncope and collapse     One episode - 07/10/15  . Migraine 08/07/2015    Past Surgical History  Procedure Laterality Date  . Cochlear implant  2010    left  . Tonsillectomy      "as a child"  . Inguinal hernia repair  1985    left  . Kidney stone surgery  ~ 2006  . Wisdom tooth extraction      Family History  Problem Relation Age of Onset  . Heart attack  Father 1    Living  . Leukemia Mother 37    Deceased  . Arthritis Father   . Skin cancer Father   . Hypertension Father   . Hyperlipidemia Father   . Alzheimer's disease Paternal Aunt     X2  . Stomach cancer Paternal Aunt     x1  . Arthritis/Rheumatoid Paternal Aunt     x1  . Neuropathy Brother     Peripheal  . Fibromyalgia Sister   . HIV Sister   . Arthritis/Rheumatoid Sister   . Diabetes Paternal Grandmother     Social history:  reports that she quit smoking about 11 years ago. Her smoking use included Cigarettes. She has a 20 pack-year smoking history. She has never used smokeless tobacco. She reports that she drinks alcohol. She reports that she does not use illicit drugs.  Medications:  Prior to Admission medications   Medication Sig Start Date End Date Taking? Authorizing Provider  albuterol (PROVENTIL)  (2.5 MG/3ML) 0.083% nebulizer solution Take 3 mLs (2.5 mg total) by nebulization every 6 (six) hours as needed for wheezing or shortness of breath. 01/06/15  Yes Brunetta Jeans, PA-C  ALPRAZolam Duanne Moron) 0.5 MG tablet Take 0.5 mg by mouth as needed for anxiety.   Yes Historical Provider, MD  cyclobenzaprine (FLEXERIL) 10 MG tablet Take 1 tablet (10 mg total) by mouth at bedtime. 07/24/15  Yes Brunetta Jeans, PA-C  fluticasone (FLONASE) 50 MCG/ACT nasal spray Place 2 sprays into both nostrils daily.   Yes Historical Provider, MD  fluticasone-salmeterol (ADVAIR HFA) 115-21 MCG/ACT inhaler Inhale 1 puff into the lungs 2 (two) times daily. 12/22/14  Yes Brunetta Jeans, PA-C  gabapentin (NEURONTIN) 100 MG capsule TAKE ONE CAPSULE BY MOUTH ONCE DAILY IN THE MORNING AND ONE CAPSULE AT [facility] AND THREE CAPSULES ONCE DAILY IN THE EVENING 06/03/15  Yes Brunetta Jeans, PA-C  levalbuterol [facility] HFA) 45 MCG/ACT inhaler INHALE 2 PUFFS EVERY 4-6 HOURS AS NEEDED 12/23/14  Yes Brunetta Jeans, PA-C  losartan (COZAAR) 25 MG tablet TAKE ONE TABLET BY MOUTH TWICE DAILY 04/28/15  Yes Brunetta Jeans, PA-C  magnesium 30 MG tablet Take 30 mg by mouth daily. Reported on 07/24/2015   Yes Historical Provider, MD  metoprolol succinate (TOPROL-XL) 50 MG 24 hr tablet Take 1 tablet by mouth daily. 03/28/15  Yes Historical Provider, MD  metoprolol tartrate (LOPRESSOR) 25 MG tablet Take 1 tablet (25 mg total) by mouth daily. Patient taking differently: Take 50 mg by mouth daily.  03/27/15  Yes Brunetta Jeans, PA-C  Multiple Vitamin (THERA) TABS Take by mouth.   Yes Historical Provider, MD  orphenadrine (NORFLEX) 100 MG tablet Take 1 tablet (100 mg total) by mouth 2 (two) times daily. 07/23/15  Yes Edward Saguier, PA-C  POTASSIUM BICARBONATE PO 395mg  each.  Pt takes 2 tablets daily.   Yes Historical Provider, MD  Potassium Gluconate 595 MG CAPS Take by mouth. Reported on 07/24/2015   Yes Historical Provider, MD  promethazine  (PHENERGAN) 12.5 MG tablet Take 1 tablet (12.5 mg total) by mouth every 6 (six) hours as needed. Reported on 03/04/2015 06/02/15  Yes Brunetta Jeans, PA-C  Propylene Glycol (SYSTANE BALANCE) 0.6 % SOLN Apply to eye as needed.   Yes Historical Provider, MD  rosuvastatin (CRESTOR) 20 MG tablet Take 1 tablet (20 mg total) by mouth every evening. 03/27/15  Yes Brunetta Jeans, PA-C  temazepam (RESTORIL) 15 MG capsule Take 30 mg by mouth at bedtime  as needed.   Yes Historical Provider, MD  tobramycin-dexamethasone [facility]) ophthalmic ointment 1 application 4 (four) times daily.   Yes Historical Provider, MD  zolpidem (AMBIEN) 10 MG tablet Take 20 mg by mouth at bedtime as needed.   Yes Historical Provider, MD      Allergies  Allergen Reactions  . Sulfa Antibiotics Hives  . Metronidazole Nausea And Vomiting  . Acyclovir And Related   . Benadryl [Diphenhydramine Hcl]     Severe emotional reaction and doesn't work well with Pt. Past history  . Cephalosporins Hives  . Codeine Other (See Comments)    Makes patient feel odd ; "sometimes mild; sometimes severe reaction; mostly severe"  . Diclofenac Sodium Nausea And Vomiting  . Nitrofurantoin Monohyd Macro     Felt funny  . Prednisone Other (See Comments)    migraine  . Tizanidine     Other reaction(s): Other (See Comments) insomnia  . Baclofen Nausea Only  . Meloxicam     Other reaction(s): Confusion  . Sulfamethoxazole Rash    ROS:  Out of a complete 14 system review of symptoms, the patient complains only of the following symptoms, and all other reviewed systems are negative.  Weight loss Hearing loss, cochlear implant Blurred vision Feeling hot Joint pain, achy muscles Allergies Memory loss, headache, dizziness, passing out Insomnia Anxiety  Blood pressure 146/79, pulse 62, height 5\' 3"  (1.6 m), weight 180 lb (81.647 kg).  Physical Exam  General: The patient is alert and cooperative at the time of the examination.  Eyes:  Pupils are equal, round, and reactive to light. Discs are flat bilaterally.  Neck: The neck is supple, no carotid bruits are noted.  Respiratory: The respiratory examination is clear.  Cardiovascular: The cardiovascular examination reveals a regular rate and rhythm, no obvious murmurs or rubs are noted.  Skin: Extremities are without significant edema.  Neurologic Exam  Mental status: The patient is alert and oriented x 3 at the time of the examination. The patient has apparent normal recent and remote memory, with an apparently normal attention span and concentration ability.  Cranial nerves: Facial symmetry is present. There is good sensation of the face to pinprick and soft touch on the right, decreased on the left face and forehead. The patient splits the midline with vibration sensation on the forehead, decreased on the left. The strength of the facial muscles and the muscles to head turning and shoulder shrug are normal bilaterally. Speech is well enunciated, no aphasia or dysarthria is noted. Extraocular movements are full. Visual fields are full. The tongue is midline, and the patient has symmetric elevation of the soft palate. No obvious hearing deficits are noted.  Motor: The motor testing reveals 5 over 5 strength of all 4 extremities. Good symmetric motor tone is noted throughout.  Sensory: Sensory testing is intact to pinprick, soft touch, vibration sensation, and position sense on all 4 extremities, with exception of some decrease in vibration sensation on the left hand as compared to the right. No evidence of extinction is noted.  Coordination: Cerebellar testing reveals good finger-nose-finger and heel-to-shin bilaterally.  Gait and station: Gait is normal. Tandem gait is very unsteady. Romberg is negative. No drift is seen.  Reflexes: Deep tendon reflexes are symmetric and normal bilaterally. Toes are downgoing bilaterally.   Assessment/Plan:  1. Recent headache, brief  syncope  2. Fibromyalgia  3. Cervical strain  4. Nonorganic neurologic examination  The patient has fibromyalgia, she has chronic neck and back pain associated  with this, but her current neck pain is slightly increased. The patient has had a recent CT angiogram of the head that did not show an aneurysm. The patient does have a prior history of migraine although she has done well in this regard for a number of years. We will give her another prescription for Phenergan at this time, she will be set up for physical therapy for the cervical spine, she will contact our office if she is not improving. We may consider a CT of the cervical spine in the future. The patient has a nonorganic neurologic examination, she splits the midline of the forehead with vibration sensation.   Jill Alexanders MD 08/07/2015 1:51 PM  [facility] [address] [place], [place] [address]  Phone [phone] Fax [phone]

## 2015-08-07 NOTE — Telephone Encounter (Signed)
Thank you Cody.   Called patient to make her aware.  Left a message for call back.  Awaiting call back.

## 2015-08-07 NOTE — Telephone Encounter (Signed)
I had checked on this same issue previously. She has in fact been taking a 25 mg short-acting metoprolol in the evening and a long-acting metoprolol (Toprol-XL) 50 mg once daily each morning, per previous PMD. I would like to increase her long-acting Toprol XL to 75 mg daily (1.5 tablets) and stop the short-acting 25 mg tablet. This will be better for her overall as short-acting medications have rebound effects if not taken twice daily.   Ok to send in new Rx for Toprol XL 50 mg -- take 1.5 tablets daily. Follow-up in 3-4 weeks.

## 2015-08-07 NOTE — Telephone Encounter (Signed)
Medication list updated in chart

## 2015-08-07 NOTE — Telephone Encounter (Signed)
She we requested [place] to update her medication list.  She is not taking Xanax - needs it changed to Ativan (unsure of dose).  Med list updated.

## 2015-08-13 ENCOUNTER — Ambulatory Visit: Payer: 59 | Attending: Neurology | Admitting: Physical Therapy

## 2015-08-13 DIAGNOSIS — R293 Abnormal posture: Secondary | ICD-10-CM | POA: Diagnosis present

## 2015-08-13 DIAGNOSIS — M542 Cervicalgia: Secondary | ICD-10-CM

## 2015-08-13 DIAGNOSIS — M6281 Muscle weakness (generalized): Secondary | ICD-10-CM | POA: Diagnosis present

## 2015-08-13 NOTE — Patient Instructions (Signed)
Levator Stretch   Grasp seat or sit on hand on side to be stretched. Turn head toward other side and look down. Use hand on head to gently stretch neck in that position. Hold _20-30___ seconds. Repeat on other side. Repeat __2-3__ times. Do _2-3___ sessions per day.  http://gt2.exer.us/30   Copyright  VHI. All rights reserved.  Side-Bending   One hand on opposite side of head, pull head to side as far as is comfortable. Stop if there is pain. Hold _20-30___ seconds. Repeat with other hand to other side. Repeat _2-3___ times. Do __2-3__ sessions per day.   Copyright  VHI. All rights reserved.  Scapular Retraction (Standing)   With arms at sides, pinch shoulder blades together. Repeat __10__ times per set. Do __1__ sets per session. Do __2-3__ sessions per day.  http://orth.exer.us/944   Copyright  VHI. All rights reserved.  Chin Protraction / Retraction   Slide head forward keeping chin level. Slide head back, pulling chin in. Hold each position _2-3__ seconds. Repeat _10__ times. Do _2-3__ sessions per day.  Copyright  VHI. All rights reserved.

## 2015-08-13 NOTE — Therapy (Signed)
[facility] [address]  [place], [place], [address] Phone: [phone]   Fax:  [phone]  Physical Therapy Evaluation  Patient Details  Name: Jasmine [name] MRN: [id number] Date of Birth: [birth year] Referring Provider: Dr. Kathrynn Ducking  Encounter Date: 08/13/2015      PT End of Session - 08/13/15 1504    Visit Number 1   Number of Visits 12   Date for PT Re-Evaluation 09/24/15   PT Start Time 1418   PT Stop Time 1452   PT Time Calculation (min) 34 min   Activity Tolerance Patient tolerated treatment well   Behavior During Therapy [facility] for tasks assessed/performed      Past Medical History  Diagnosis Date  . Schizophrenia ([place])     treated by Dr. Casimiro Needle  . Hypertension   . High cholesterol   . Heart murmur   . Asthma   . Pneumonia ~ 2009  . History of bronchitis     "last time ~ 2009, before I had pneumonia"  . Hypoglycemia   . Migraine     "used to have them often"  . Seizures ([place])     last seizure 3 years ago, not on AED  . Seizure ([place]) 07/07/11  . Stroke ([place]) 07/08/11    "I've had mini strokes before; left side of face  is more down than right "  . Chronic high back pain   . Kidney stone   . Anxiety   . PTSD (post-traumatic stress disorder)   . Double vision   . Hyperparathyroidism ([place])   . Meniscus tear     Right  . Syncope and collapse     One episode - 07/10/15  . Migraine 08/07/2015    Past Surgical History  Procedure Laterality Date  . Cochlear implant  2010    left  . Tonsillectomy      "as a child"  . Inguinal hernia repair  1985    left  . Kidney stone surgery  ~ 2006  . Wisdom tooth extraction      There were no vitals filed for this visit.       Subjective Assessment - 08/13/15 1422    Subjective Pt is a 60 y/o female who presents to OPPT for neck pain.  Pt reports intermittent pain x 30 years with exacerbation in past 2 weeks.  Pt also reports R knee pain but  outside scope of referral.     Pertinent History schizophrenia, HTN, HLD, heart murmur, asthma, PNA, migraines, seizure, CVA, anxiety, PTSD   Limitations House hold activities;Reading   Diagnostic tests degenerated discs and bone spurs in neck   Patient Stated Goals improve movement in neck   Currently in Pain? Yes   Pain Score 5    Pain Location Neck   Pain Orientation Mid;Posterior;Right   Pain Descriptors / Indicators Spasm;Squeezing;Tightness   Pain Type Chronic pain   Pain Onset More than a month ago   Pain Frequency Intermittent   Aggravating Factors  activity   Pain Relieving Factors ice, shoulder   Multiple Pain Sites Yes   Pain Score --  did not rate   Pain Location Knee   Pain Orientation Right   Aggravating Factors  will monitor however will not directly address as it is outside scope of referral            [facility] PT Assessment - 08/13/15 1426    Assessment  Medical Diagnosis neck pain   Referring Provider Dr. Kathrynn Ducking   Onset Date/Surgical Date --  2 wks ago   Hand Dominance Left  writes/eats with L; everything else with R   Prior Therapy none for this condition   Precautions   Precautions --   Precaution Comments hx of seizures; no estim   Restrictions   Weight Bearing Restrictions No   Balance Screen   Has the patient fallen in the past 6 months No   Has the patient had a decrease in activity level because of a fear of falling?  No   Is the patient reluctant to leave their home because of a fear of falling?  No   Home Environment   Living Environment Private residence   Living Arrangements Spouse/significant other;Children   Type of [place] to enter   Entrance Stairs-Number of Steps 1   Home Layout Two level;Bed/bath upstairs   Alternate Level Stairs-Number of Steps 18   Alternate Level Stairs-Rails Right   Prior Function   Level of Independence Independent   Vocation Works at home   [employer] tends to  housework   Leisure visits friends; shopping   Posture/Postural Control   Posture/Postural Control Postural limitations   Postural Limitations Rounded Shoulders;Forward head;Increased thoracic kyphosis   AROM   AROM Assessment Site Cervical   Cervical Flexion 21   Cervical Extension 20   Cervical - Right Side Bend 12   Cervical - Left Side Bend 18   Cervical - Right Rotation 41   Cervical - Left Rotation 15   Strength   Strength Assessment Site Shoulder;Elbow;Hand   Right Shoulder Flexion 3+/5   Right Shoulder ABduction 3/5   Right Shoulder Internal Rotation 4/5   Right Shoulder External Rotation 4-/5   Left Shoulder Flexion 4/5   Left Shoulder ABduction 4-/5   Left Shoulder Internal Rotation 4/5   Left Shoulder External Rotation 4/5   Right/Left Elbow Right;Left   Right Elbow Flexion 4/5   Right Elbow Extension 4/5   Left Elbow Flexion 4/5   Left Elbow Extension 4/5   Right Hand Grip (lbs) 45.67  44, 49, 44   Left Hand Grip (lbs) 40.33  35, 41, 45   Palpation   Palpation comment significant tightness and tenderness noted in bil upper traps, bil levator scapulae, rhomboids and cervical parsaspinals   Special Tests    Special Tests Cervical   Cervical Tests Spurling's;Dictraction   Spurling's   Findings Negative   Comment no change in symptoms   Distraction Test   Findngs Positive   Comment pt reports increase in symptoms and inability to tolerate testing (PT barely began to provide distraction)   Ambulation/Gait   Gait Comments pt with antalgic gait due to R knee pain                   [facility] Adult PT Treatment/Exercise - 08/13/15 1426    Exercises   Exercises Neck   Neck Exercises: Seated   Neck Retraction 10 reps   Neck Retraction Limitations mod cues for technique   Other Seated Exercise scapular retraction x 10   Neck Exercises: Stretches   Upper Trapezius Stretch 1 rep;30 seconds   Upper Trapezius Stretch Limitations bil   Levator Stretch 1  rep;30 seconds   Levator Stretch Limitations bil                PT Education - 08/13/15 1502  Education provided Yes   Education Details HEP, clinical findings, POC, goals of care   Person(s) Educated Patient   Methods Explanation;Demonstration;Handout;Verbal cues;Tactile cues   Comprehension Verbalized understanding;Returned demonstration;Need further instruction;Verbal cues required;Tactile cues required             PT Long Term Goals - 08/13/15 1518    PT LONG TERM GOAL #1   Title independent with HEP (09/24/15)   Time 6   Period Weeks   Status New   PT LONG TERM GOAL #2   Title verbalize understanding of posture/body mechanics to decrease risk of reinjury (09/24/15)   Time 6   Period Weeks   Status New   PT LONG TERM GOAL #3   Title improve cervical ROM by 7 degrees all planes for improved motion and decreased pain (09/24/15)   Time 6   Period Weeks   Status New   PT LONG TERM GOAL #4   Title report pain < 3/10 with activity for decreased pain and improved function (09/24/15)   Time 6   Period Weeks   Status New               Plan - 08/13/15 1505    Clinical Impression Statement Pt is a 60 y/o female who presents to OPPT for moderate complexity PT evaluation of neck pain.  Pt reports increased pain x 2 weeks of unknown onset in setting of chronic neck pain.  Pt demonstrates decreased strength, abnormal posture, decreased ROM and pain affecting function and ADLs.  Pt will benefit from PT to maximize function and address deficits listed.     Rehab Potential Good   PT Frequency 2x / week   PT Duration 6 weeks   PT Treatment/Interventions ADLs/Self Care Home Management;Cryotherapy;Moist Heat;Traction;Ultrasound;Patient/family education;Neuromuscular re-education;Therapeutic exercise;Therapeutic activities;Functional mobility training;Manual techniques;Passive range of motion;Dry needling;Taping   PT Next Visit Plan review HEP; progress exercises slowly  for posture and increased tissue extensibility (pt reports she has fibromyalgia and doesn't want it to "flare up")   Consulted and Agree with Plan of Care Patient      Patient will benefit from skilled therapeutic intervention in order to improve the following deficits and impairments:  Decreased strength, Increased muscle spasms, Improper body mechanics, Postural dysfunction, Impaired UE functional use, Decreased range of motion  Visit Diagnosis: Cervicalgia - Plan: PT plan of care cert/re-cert  Muscle weakness (generalized) - Plan: PT plan of care cert/re-cert  Abnormal posture - Plan: PT plan of care cert/re-cert     Problem List Patient Active Problem List   Diagnosis Date Noted  . Migraine 08/07/2015  . Neck pain 08/07/2015  . Anorexia 06/02/2015  . Acute low back pain 05/03/2015  . Bilateral leg pain 04/26/2015  . Dysphagia 03/29/2015  . Myalgia and myositis 03/04/2015  . DJD (degenerative joint disease) of knee 12/09/2014   Laureen Abrahams, PT, DPT 08/13/2015 3:24 PM  [facility] [address]  [place], [place], [address] Phone: [phone]   Fax:  [phone]  Name: Jasmine [name] MRN: [id number] Date of Birth: 01-23-1956

## 2015-08-15 ENCOUNTER — Other Ambulatory Visit: Payer: Self-pay | Admitting: Physician Assistant

## 2015-08-17 ENCOUNTER — Ambulatory Visit: Payer: 59

## 2015-08-19 ENCOUNTER — Ambulatory Visit: Payer: 59

## 2015-08-19 DIAGNOSIS — M542 Cervicalgia: Secondary | ICD-10-CM | POA: Diagnosis not present

## 2015-08-19 DIAGNOSIS — R293 Abnormal posture: Secondary | ICD-10-CM

## 2015-08-19 DIAGNOSIS — M6281 Muscle weakness (generalized): Secondary | ICD-10-CM

## 2015-08-19 NOTE — Therapy (Signed)
[facility] [address]  [place], [place], [address] Phone: [phone]   Fax:  [phone]  Physical Therapy Treatment  Patient Details  Name: Jasmine [name] MRN: [id number] Date of Birth: [birth year] Referring Provider: Dr. Kathrynn Ducking  Encounter Date: 08/19/2015      PT End of Session - 08/19/15 1620    Visit Number 2   Number of Visits 12   Date for PT Re-Evaluation 09/24/15   PT Start Time [id number]   PT Stop Time 1617   PT Time Calculation (min) 43 min   Activity Tolerance Patient tolerated treatment well   Behavior During Therapy [facility] for tasks assessed/performed      Past Medical History  Diagnosis Date  . Schizophrenia ([place])     treated by Dr. Casimiro Needle  . Hypertension   . High cholesterol   . Heart murmur   . Asthma   . Pneumonia ~ 2009  . History of bronchitis     "last time ~ 2009, before I had pneumonia"  . Hypoglycemia   . Migraine     "used to have them often"  . Seizures ([place])     last seizure 3 years ago, not on AED  . Seizure ([place]) 07/07/11  . Stroke ([place]) 07/08/11    "I've had mini strokes before; left side of face  is more down than right "  . Chronic high back pain   . Kidney stone   . Anxiety   . PTSD (post-traumatic stress disorder)   . Double vision   . Hyperparathyroidism ([place])   . Meniscus tear     Right  . Syncope and collapse     One episode - 07/10/15  . Migraine 08/07/2015    Past Surgical History  Procedure Laterality Date  . Cochlear implant  2010    left  . Tonsillectomy      "as a child"  . Inguinal hernia repair  1985    left  . Kidney stone surgery  ~ 2006  . Wisdom tooth extraction      There were no vitals filed for this visit.      Subjective Assessment - 08/19/15 1538    Subjective Pt. reports her neck pain is a 5/10 resting, 7/10 while turning her head to the L.  Pt. reports her L knee has been hurting however unable to assign a number to pain.   Pt. reports she hasn't been able to perform HEP the last few few days since last treatment due to her husband being sick.     Patient Stated Goals improve movement in neck   Currently in Pain? Yes   Pain Score 5    Pain Location Neck   Pain Orientation Right;Posterior   Pain Descriptors / Indicators Aching;Tightness   Pain Type Chronic pain   Pain Onset More than a month ago   Multiple Pain Sites No      Today's treatment:  Therex: HEP review: Chin tuck x 15 reps  Levator stretch x 30 sec each side Trapezius stretch x 30 sec side  Seated retraction x 15 reps 2" hold   Therex: Doorway stretch x 30 sec  Corner stretch x 30 sec  Hooklying on table:      Cross stretch x 1 min        B horizontal abduction with red TB x 15 reps Seated scapular retraction (no weight) x 10 reps  Postural education: Sitting  posture review        PT Long Term Goals - 08/19/15 1722    PT LONG TERM GOAL #1   Title independent with HEP (09/24/15)   Time 6   Period Weeks   Status On-going   PT LONG TERM GOAL #2   Title verbalize understanding of posture/body mechanics to decrease risk of reinjury (09/24/15)   Time 6   Period Weeks   Status On-going   PT LONG TERM GOAL #3   Title improve cervical ROM by 7 degrees all planes for improved motion and decreased pain (09/24/15)   Time 6   Period Weeks   Status On-going   PT LONG TERM GOAL #4   Title report pain < 3/10 with activity for decreased pain and improved function (09/24/15)   Time 6   Period Weeks   Status On-going               Plan - 08/19/15 1621    Clinical Impression Statement Pt. reports her neck pain is a 5/10 resting, 7/10 while turning her head to the L.  Pt. reports her L knee has been hurting however unable to assign a number to pain.  Pt. reports she hasn't been able to perform HEP the last few few days since last treatment due to her husband being sick.  Today's treatment focused on gentle anterior chest stretching  and conservative scapular strengthening; pt. tolerated all well.  Postural education reviewed with pt. today as well as HEP.         PT Treatment/Interventions ADLs/Self Care Home Management;Cryotherapy;Moist Heat;Traction;Ultrasound;Patient/family education;Neuromuscular re-education;Therapeutic exercise;Therapeutic activities;Functional mobility training;Manual techniques;Passive range of motion;Dry needling;Taping   PT Next Visit Plan Progress exercises slowly for posture and increased tissue extensibility (pt reports she has fibromyalgia and doesn't want it to "flare up")      Patient will benefit from skilled therapeutic intervention in order to improve the following deficits and impairments:  Decreased strength, Increased muscle spasms, Improper body mechanics, Postural dysfunction, Impaired UE functional use, Decreased range of motion  Visit Diagnosis: Cervicalgia  Muscle weakness (generalized)  Abnormal posture     Problem List Patient Active Problem List   Diagnosis Date Noted  . Migraine 08/07/2015  . Neck pain 08/07/2015  . Anorexia 06/02/2015  . Acute low back pain 05/03/2015  . Bilateral leg pain 04/26/2015  . Dysphagia 03/29/2015  . Myalgia and myositis 03/04/2015  . DJD (degenerative joint disease) of knee 12/09/2014    Bess Harvest, PTA 08/19/2015, 5:29 PM  [facility] [address]  [place], [place], [address] Phone: [phone]   Fax:  [phone]  Name: NIVEEN ISON MRN: [id number] Date of Birth: 04-Mar-1955

## 2015-08-24 ENCOUNTER — Ambulatory Visit: Payer: 59

## 2015-08-25 ENCOUNTER — Telehealth: Payer: Self-pay | Admitting: Physical Therapy

## 2015-08-25 ENCOUNTER — Telehealth: Payer: Self-pay | Admitting: Physician Assistant

## 2015-08-25 NOTE — Telephone Encounter (Signed)
Pt left copy of Bill

## 2015-08-25 NOTE — Telephone Encounter (Signed)
Called pt and LVM to call back and have her appt rescheduled.

## 2015-08-25 NOTE — Telephone Encounter (Signed)
Patient needs ER follow-up with a provider this week. Thanks/SLS 06/27

## 2015-08-25 NOTE — Telephone Encounter (Signed)
Pt stopped by clinic to speak with PT.  Spoke with patient about concerns from last session.  Pt had PT tx session on Wednesday 6/21 and developed chest pain at approx 4:40 pm Friday.  Pt went to [facility] ED and cardiac etiology ruled out.  Pt states ED MD reported pain is musculoskeletal and pt felt it was due to PT tx on Wednesday.  This PT reviewed PTA's note which indicated light activity including gentle stretching and postural exercises and education were performed during that session.  Pt stated she was sore in her back and arms Thursday following treatment but denied any chest pain.  When asked if pt could recall any specific exercises that may have increased her pain she was unable to identify any exercises that were painful during the session.   When asked later in the conversation she mentioned a doorway chest stretch and horizontal abduction with a red theraband were "painful."  (Of note, during explanation pt demonstrated incorrect technique of doorway stretch so educated pt on proper technique).  Asked pt to hold on these exercises for now.  Pt unsure of how to proceed with physical therapy.  Recommended pt return for next scheduled session and we could try to decrease the intensity of exercise, but last session was already a low intensity session and our options for even lighter exercise is limited.  Also stated if pain returns again following next PT session, then this may not be beneficial and we would likely recommend holding PT and for pt to follow up with MD.  Pt verbalized understanding.

## 2015-08-25 NOTE — Telephone Encounter (Signed)
Caller name:Pranika Relation to pt: self Call back number:[phone] Pharmacy:  Reason for call: Pt came in office stating had received a bill from our office mentioning that she was seen on 01-29-15 by one of our providers and [employer] did not paid for it, reason was that provider was not under [facility] network. Please verify for pt and let pt know if it was adjusted or not.

## 2015-08-25 NOTE — Telephone Encounter (Signed)
Caller name: WANISHA DELFOSSE Relation to pt: self Call back number: [phone] Pharmacy:  Reason for call: Pt came in office stating was at the ER at [facility] for chest pain on Friday 08-21-15 and would like for her PCP to be informed. Pt will schedule appt for fu ER.

## 2015-08-27 ENCOUNTER — Ambulatory Visit: Payer: 59

## 2015-08-27 NOTE — Telephone Encounter (Signed)
Bill filed again with billing provider changed to [place]. Patient notified

## 2015-08-31 ENCOUNTER — Ambulatory Visit: Payer: 59 | Admitting: Family

## 2015-08-31 DIAGNOSIS — Z0289 Encounter for other administrative examinations: Secondary | ICD-10-CM

## 2015-09-02 ENCOUNTER — Ambulatory Visit: Payer: 59 | Admitting: Physician Assistant

## 2015-09-02 ENCOUNTER — Ambulatory Visit: Payer: 59

## 2015-09-02 ENCOUNTER — Telehealth: Payer: Self-pay | Admitting: Family

## 2015-09-02 ENCOUNTER — Encounter: Payer: Self-pay | Admitting: Physician Assistant

## 2015-09-02 NOTE — Telephone Encounter (Signed)
Yes please

## 2015-09-02 NOTE — Telephone Encounter (Signed)
Patient of Cody's scheduled to see you 08/31/2015 and was No Show.  Charge or No Charge?

## 2015-09-04 ENCOUNTER — Ambulatory Visit: Payer: 59

## 2015-09-04 ENCOUNTER — Ambulatory Visit: Payer: 59 | Admitting: Physician Assistant

## 2015-09-07 ENCOUNTER — Other Ambulatory Visit: Payer: Self-pay | Admitting: Physician Assistant

## 2015-09-07 ENCOUNTER — Ambulatory Visit: Payer: 59 | Attending: Neurology | Admitting: Physical Therapy

## 2015-09-07 DIAGNOSIS — M6281 Muscle weakness (generalized): Secondary | ICD-10-CM | POA: Diagnosis present

## 2015-09-07 DIAGNOSIS — R293 Abnormal posture: Secondary | ICD-10-CM

## 2015-09-07 DIAGNOSIS — M542 Cervicalgia: Secondary | ICD-10-CM | POA: Diagnosis present

## 2015-09-07 MED ORDER — ORPHENADRINE CITRATE ER 100 MG PO TB12: 100.0000 mg | ORAL_TABLET | Freq: Every morning | ORAL | Status: DC

## 2015-09-07 NOTE — Telephone Encounter (Signed)
Rx request to pharmacy/SLS Requested drug refills are authorized 30-day only, however, the patient needs further evaluation and/or laboratory testing before further refills are given. Ask her to make an appointment for this.  Please call patient and schedule F/U appointment prior to future refills per provider/SLS 07/10 Thanks.

## 2015-09-07 NOTE — Therapy (Addendum)
[facility] [address]  [place], [place], [address] Phone: [phone]   Fax:  [phone]  Physical Therapy Treatment  Patient Details  Name: Jasmine [name] MRN: [id number] Date of Birth: 1955/07/13 Referring Provider: Dr. Kathrynn Ducking  Encounter Date: 09/07/2015      PT End of Session - 09/07/15 1607    Visit Number 3   Number of Visits 12   Date for PT Re-Evaluation 09/24/15   PT Start Time 4097   PT Stop Time 3532   PT Time Calculation (min) 44 min   Activity Tolerance Patient tolerated treatment well;No increased pain   Behavior During Therapy [facility] for tasks assessed/performed      Past Medical History  Diagnosis Date  . Schizophrenia ([place])     treated by Dr. Casimiro Needle  . Hypertension   . High cholesterol   . Heart murmur   . Asthma   . Pneumonia ~ 2009  . History of bronchitis     "last time ~ 2009, before I had pneumonia"  . Hypoglycemia   . Migraine     "used to have them often"  . Seizures ([place])     last seizure 3 years ago, not on AED  . Seizure ([place]) 07/07/11  . Stroke ([place]) 07/08/11    "I've had mini strokes before; left side of face  is more down than right "  . Chronic high back pain   . Kidney stone   . Anxiety   . PTSD (post-traumatic stress disorder)   . Double vision   . Hyperparathyroidism ([place])   . Meniscus tear     Right  . Syncope and collapse     One episode - 07/10/15  . Migraine 08/07/2015    Past Surgical History  Procedure Laterality Date  . Cochlear implant  2010    left  . Tonsillectomy      "as a child"  . Inguinal hernia repair  1985    left  . Kidney stone surgery  ~ 2006  . Wisdom tooth extraction      There were no vitals filed for this visit.      Subjective Assessment - 09/07/15 1531    Subjective returns to PT after missing almost 3 weeks; has had some family emergencies to tend to.  neck is "ok."  couldn't do exercises last week but did them the  week before.   Pertinent History schizophrenia, HTN, HLD, heart murmur, asthma, PNA, migraines, seizure, CVA, anxiety, PTSD   Limitations House hold activities;Reading   Patient Stated Goals improve movement in neck   Currently in Pain? Yes   Pain Score 5    Pain Location Neck   Pain Orientation Upper;Mid;Posterior;Medial   Pain Descriptors / Indicators Aching;Sharp  "aggravating"   Pain Type Chronic pain   Pain Onset More than a month ago   Pain Frequency Intermittent   Aggravating Factors  activity   Pain Relieving Factors ice, shoulder                         [facility] Adult PT Treatment/Exercise - 09/07/15 0001    Neck Exercises: Seated   Shoulder Rolls Backwards;10 reps   Neck Exercises: Supine   Neck Retraction 10 reps;5 secs   Neck Retraction Limitations on towel roll   Shoulder Flexion 10 reps;Both   Shoulder Flexion Limitations alternating on towel roll   Shoulder ABduction Both;10 reps  Other Supine Exercise external rotation x 10 bil    Modalities   Modalities Moist Heat   Moist Heat Therapy   Number Minutes Moist Heat 10 Minutes   Moist Heat Location Cervical   Manual Therapy   Manual Therapy Soft tissue mobilization;Myofascial release   Manual therapy comments pt reports decreased pain following manual   Soft tissue mobilization bil upper traps and cervical paraspinals   Myofascial Release trigger point release bil upper trap   Neck Exercises: Stretches   Upper Trapezius Stretch 1 rep;30 seconds   Upper Trapezius Stretch Limitations bil   Levator Stretch 1 rep;30 seconds   Levator Stretch Limitations bil   Chest Stretch 2 reps;60 seconds   Other Neck Stretches mod to max cues needed for technique - reinforced this should not increase pain and gentle stretch should be felt   Other Neck Stretches chest stretch on towel roll as pt unable to tolerate noodle                PT Education - 09/07/15 1607    Education provided Yes    Education Details consistency with HEP and goals for stretches   Person(s) Educated Patient   Methods Explanation   Comprehension Verbalized understanding             PT Long Term Goals - 08/19/15 1722    PT LONG TERM GOAL #1   Title independent with HEP (09/24/15)   Time 6   Period Weeks   Status On-going   PT LONG TERM GOAL #2   Title verbalize understanding of posture/body mechanics to decrease risk of reinjury (09/24/15)   Time 6   Period Weeks   Status On-going   PT LONG TERM GOAL #3   Title improve cervical ROM by 7 degrees all planes for improved motion and decreased pain (09/24/15)   Time 6   Period Weeks   Status On-going   PT LONG TERM GOAL #4   Title report pain < 3/10 with activity for decreased pain and improved function (09/24/15)   Time 6   Period Weeks   Status On-going               Plan - 09/07/15 1607    Clinical Impression Statement Pt tolerated exercises well today and frequently asked throughout session about pain and pt denied increase in pain except with attempting to lie on pool noodle.  Immediately removed noodle and used towel in upper thoracic spine instead which pt tolerated well.  Pt has missed 2 1/2 weeks of PT and reports limited compliance with HEP due to family emergency and needing to move son's belongings from [place] back to [facility].  Reinforced need for consistent exercise to maximize benefit.  Will continue to beneift from PT to maximize function and decrease pain.  Pain decreased to 2/10 after session.   PT Next Visit Plan Progress exercises slowly for posture and increased tissue extensibility (pt reports she has fibromyalgia and doesn't want it to "flare up")   Consulted and Agree with Plan of Care Patient      Patient will benefit from skilled therapeutic intervention in order to improve the following deficits and impairments:  Decreased strength, Increased muscle spasms, Improper body mechanics, Postural dysfunction, Impaired UE  functional use, Decreased range of motion  Visit Diagnosis: Cervicalgia  Muscle weakness (generalized)  Abnormal posture     Problem List Patient Active Problem List   Diagnosis Date Noted  . Migraine 08/07/2015  . Neck  pain 08/07/2015  . Anorexia 06/02/2015  . Acute low back pain 05/03/2015  . Bilateral leg pain 04/26/2015  . Dysphagia 03/29/2015  . Myalgia and myositis 03/04/2015  . DJD (degenerative joint disease) of knee 12/09/2014   Laureen Abrahams, PT, DPT 09/07/2015 4:15 PM  [facility] [address]  [place], [place], [address] Phone: [phone]   Fax:  [phone]  Name: Jasmine [name] MRN: [id number] Date of Birth: 1955-08-20          PHYSICAL THERAPY DISCHARGE SUMMARY  Visits from Start of Care: 3  Current functional level related to goals / functional outcomes: See above   Remaining deficits: unknown   Education / Equipment: HEP  Plan: Patient agrees to discharge.  Patient goals were not met. Patient is being discharged due to the patient's request.  ?????    Pt requested d/c 09/11/15 stating "therapist not working with me."  Pt arrived > 15 min late to last scheduled PT session (3/36/12) and per policy was asked to reschedule.  Pt very upset with policy leaving office, and then called next day to discharge.     Laureen Abrahams, PT, DPT 09/11/2015 7:54 AM   Outpatient Rehab at [facility] [place], [place] [address]  [phone] (office) [phone] (fax)

## 2015-09-07 NOTE — Telephone Encounter (Signed)
Caller name:Margene  Relation to pt: self Call back number: [phone] Pharmacy:[facility] [place], [place]  Reason for call: Pt needs refill on orphenadrine (NORFLEX) 100 MG tablet. Pt states was out of town (at [place]) for a wk and has been out of her meds during that time.  Please advise.

## 2015-09-08 ENCOUNTER — Ambulatory Visit (INDEPENDENT_AMBULATORY_CARE_PROVIDER_SITE_OTHER): Payer: 59 | Admitting: Physician Assistant

## 2015-09-08 ENCOUNTER — Encounter: Payer: Self-pay | Admitting: Physician Assistant

## 2015-09-08 ENCOUNTER — Ambulatory Visit: Payer: 59 | Admitting: Physician Assistant

## 2015-09-08 VITALS — BP 119/53 | HR 69 | Resp 16 | Ht 63.0 in | Wt 180.5 lb

## 2015-09-08 DIAGNOSIS — R0789 Other chest pain: Secondary | ICD-10-CM

## 2015-09-08 MED ORDER — FLUTICASONE PROPIONATE 50 MCG/ACT NA SUSP: 2.0000 | Freq: Every day | NASAL | Status: DC

## 2015-09-08 NOTE — Patient Instructions (Signed)
Please continue medications as directed. Your Norflex has been refilled. Follow-up with Dr. Tonita Cong as scheduled today. Be consistent with PT appointments as this will greatly benefit you.

## 2015-09-08 NOTE — Progress Notes (Signed)
Patient with presents to clinic today for ER follow-up. Patient presented to [facility] ER on 08/22/15 with c/o left sided chest pain. Workup included EKG (NSR), normal serial troponin and CXR. Pain was felt to be MSK in nature. Patient encouraged to limit heavy lifting and continue muscle relaxants. Endorses no residual pain since that time. Denies SOB, palpitations, LH or dizziness. Notes some increased pain after PT yesterday. Is due for refill of Norflex. Has appointment later today with Orthopedics (Dr. Tonita Cong) to discuss upcoming knee surgery. Denies other complaints today.  Past Medical History  Diagnosis Date  . Schizophrenia ([place])     treated by Dr. Casimiro Needle  . Hypertension   . High cholesterol   . Heart murmur   . Asthma   . Pneumonia ~ 2009  . History of bronchitis     "last time ~ 2009, before I had pneumonia"  . Hypoglycemia   . Migraine     "used to have them often"  . Seizures ([place])     last seizure 3 years ago, not on AED  . Seizure ([place]) 07/07/11  . Stroke ([place]) 07/08/11    "I've had mini strokes before; left side of face  is more down than right "  . Chronic high back pain   . Kidney stone   . Anxiety   . PTSD (post-traumatic stress disorder)   . Double vision   . Hyperparathyroidism ([place])   . Meniscus tear     Right  . Syncope and collapse     One episode - 07/10/15  . Migraine 08/07/2015    Current Outpatient Prescriptions on File Prior to Visit  Medication Sig Dispense Refill  . albuterol (PROVENTIL) (2.5 MG/3ML) 0.083% nebulizer solution Take 3 mLs (2.5 mg total) by nebulization every 6 (six) hours as needed for wheezing or shortness of breath. 75 mL 5  . fluticasone (FLONASE) 50 MCG/ACT nasal spray Place 2 sprays into both nostrils daily.    . fluticasone-salmeterol (ADVAIR HFA) 115-21 MCG/ACT inhaler Inhale 1 puff into the lungs 2 (two) times daily. 1 Inhaler 5  . gabapentin (NEURONTIN) 100 MG capsule TAKE ONE CAPSULE BY MOUTH ONCE DAILY IN THE MORNING AND ONE  CAPSULE AT NOON AND THREE CAPSULES ONCE DAILY IN THE EVENING (Patient taking differently: TAKE ONE CAPSULE BY MOUTH ONCE DAILY IN THE MORNING AND ONE CAPSULE AT NOON AND ONE CAPSULE ONCE DAILY IN THE EVENING) 150 capsule 0  . levalbuterol (XOPENEX HFA) 45 MCG/ACT inhaler INHALE 2 PUFFS EVERY 4-6 HOURS AS NEEDED 1 Inhaler 5  . LORazepam (ATIVAN PO) Take 1 mg by mouth as needed (Takes 0.5mg ).     . losartan (COZAAR) 25 MG tablet TAKE ONE TABLET BY MOUTH TWICE DAILY 60 tablet 5  . magnesium 30 MG tablet Take 30 mg by mouth daily. Reported on 07/24/2015    . metoprolol succinate (TOPROL-XL) 25 MG 24 hr tablet Take 3 tablets (75 mg total) by mouth daily. 270 tablet 1  . Multiple Vitamin (THERA) TABS Take by mouth.    . orphenadrine (NORFLEX) 100 MG tablet Take 1 tablet (100 mg total) by mouth every morning. 30 tablet 0  . POTASSIUM BICARBONATE PO Reported on 08/13/2015    . Potassium Gluconate 595 MG CAPS Take by mouth. Reported on 08/13/2015    . promethazine (PHENERGAN) 12.5 MG tablet Take 1 tablet (12.5 mg total) by mouth every 6 (six) hours as needed. Reported on 03/04/2015 30 tablet 1  . rosuvastatin (CRESTOR) 20 MG  tablet TAKE ONE TABLET BY MOUTH ONCE DAILY IN THE EVENING 30 tablet 3  . temazepam (RESTORIL) 15 MG capsule Take 30 mg by mouth at bedtime as needed.    . traMADol (ULTRAM) 50 MG tablet Take 1 tablet (50 mg total) by mouth every 12 (twelve) hours as needed. 60 tablet 0  . zolpidem (AMBIEN) 10 MG tablet Take 20 mg by mouth at bedtime as needed.     No current facility-administered medications on file prior to visit.    Allergies  Allergen Reactions  . Sulfa Antibiotics Hives  . Metronidazole Nausea And Vomiting  . Acyclovir And Related   . Benadryl [Diphenhydramine Hcl]     Severe emotional reaction and doesn't work well with Pt. Past history  . Cephalosporins Hives  . Codeine Other (See Comments)    Makes patient feel odd ; "sometimes mild; sometimes severe reaction; mostly severe"   . Cyclobenzaprine Other (See Comments)    Muscle Aches.  . Diclofenac Sodium Nausea And Vomiting  . Nitrofurantoin Monohyd Macro     Felt funny  . Prednisone Other (See Comments)    migraine  . Tizanidine     Other reaction(s): Other (See Comments) insomnia  . Baclofen Nausea Only  . Meloxicam     Other reaction(s): Confusion  . Sulfamethoxazole Rash    Family History  Problem Relation Age of Onset  . Heart attack Father 30    Living  . Leukemia Mother 104    Deceased  . Arthritis Father   . Skin cancer Father   . Hypertension Father   . Hyperlipidemia Father   . Alzheimer's disease Paternal Aunt     X2  . Stomach cancer Paternal Aunt     x1  . Arthritis/Rheumatoid Paternal Aunt     x1  . Neuropathy Brother     Peripheal  . Fibromyalgia Sister   . HIV Sister   . Arthritis/Rheumatoid Sister   . Diabetes Paternal Grandmother     Social History   Social History  . Marital Status: Legally Separated    Spouse Name: N/A  . Number of Children: 2  . Years of Education: 2 yrs coll   Occupational History  . Disabled    Social History Main Topics  . Smoking status: Former Smoker -- 1.00 packs/day for 20 years    Types: Cigarettes    Quit date: 02/29/2004  . Smokeless tobacco: Never Used  . Alcohol Use: 0.0 oz/week    0 Standard drinks or equivalent per week     Comment: "glass of wine q once in awhile; not very often; do it on special occasion"  . Drug Use: No  . Sexual Activity: No   Other Topics Concern  . None   Social History Narrative   Completed 2 years of college.    On disability.   Lives at home with her husband.   No caffeine use.   Left-handed.   Three children, 2 biological - 1 adopted.    Review of Systems - See HPI.  All other ROS are negative.  BP 119/53 mmHg  Pulse 69  Resp 16  Ht 5\' 3"  (1.6 m)  Wt 180 lb 8 oz (81.874 kg)  BMI 31.98 kg/m2  SpO2 98%  Physical Exam  Constitutional: She is oriented to person, place, and time and  well-developed, well-nourished, and in no distress.  HENT:  Head: Normocephalic and atraumatic.  Eyes: Conjunctivae are normal.  Cardiovascular: Normal rate, regular rhythm, normal heart  sounds and intact distal pulses.   Pulmonary/Chest: Effort normal and breath sounds normal. She exhibits no tenderness.  Musculoskeletal:       Left shoulder: She exhibits normal range of motion, no tenderness and normal strength.       Cervical back: She exhibits tenderness. She exhibits normal range of motion and no bony tenderness.  Neurological: She is alert and oriented to person, place, and time.  Skin: Skin is warm and dry. No rash noted.  Vitals reviewed.  Assessment/Plan: 1. Musculoskeletal chest pain Resolved. There is residual cervical neck pain, patient with chronic cervicalgia and fibromyalgia. Is continuing PT and noting benefit. Norflex refilled. Continue therapy. FU with Neurology and Ortho as scheduled.   Leeanne Rio, PA-C

## 2015-09-08 NOTE — Progress Notes (Signed)
Pre visit review using our clinic review tool, if applicable. No additional management support is needed unless otherwise documented below in the visit note/SLS  

## 2015-09-09 ENCOUNTER — Ambulatory Visit: Payer: 59 | Admitting: Physician Assistant

## 2015-09-10 ENCOUNTER — Ambulatory Visit: Payer: 59

## 2015-09-11 ENCOUNTER — Telehealth: Payer: Self-pay | Admitting: Neurology

## 2015-09-11 ENCOUNTER — Other Ambulatory Visit: Payer: Self-pay | Admitting: Physician Assistant

## 2015-09-11 DIAGNOSIS — M542 Cervicalgia: Secondary | ICD-10-CM

## 2015-09-11 NOTE — Telephone Encounter (Addendum)
Pt called wanting a referral to [facility] Physical Therapy. May call pt [phone] [facility] phone:[phone]

## 2015-09-11 NOTE — Telephone Encounter (Signed)
Pt was referred to [facility] PT in June. She did go 1-2 times but was "not impressed/happy" and would like to be referred to [facility] PT.

## 2015-09-11 NOTE — Telephone Encounter (Signed)
Refill sent per [facility] refill protocol/SLS  

## 2015-09-11 NOTE — Telephone Encounter (Signed)
I called patient. The patient was referred to physical therapy, she decided to quit the therapy when she showed up 15 minutes late and had to be rescheduled. The patient wishes to be referred to physical therapy in [facility], telephone number is [phone]. I will make the referral.

## 2015-09-11 NOTE — Addendum Note (Signed)
Addended by: Margette Fast on: 09/11/2015 02:51 PM   Modules accepted: Orders

## 2015-09-14 ENCOUNTER — Ambulatory Visit: Payer: 59

## 2015-09-17 ENCOUNTER — Ambulatory Visit: Payer: 59

## 2015-09-21 ENCOUNTER — Ambulatory Visit: Payer: 59

## 2015-09-24 ENCOUNTER — Ambulatory Visit: Payer: 59

## 2015-09-24 NOTE — Telephone Encounter (Signed)
Opened in error

## 2015-10-07 ENCOUNTER — Other Ambulatory Visit: Payer: Self-pay | Admitting: Physician Assistant

## 2015-10-07 MED ORDER — ORPHENADRINE CITRATE ER 100 MG PO TB12: 100.0000 mg | ORAL_TABLET | Freq: Two times a day (BID) | ORAL | 0 refills | Status: DC | PRN

## 2015-10-07 NOTE — Telephone Encounter (Signed)
Rx request to pharmacy per provider VO/SLS 08/09

## 2015-10-12 ENCOUNTER — Telehealth: Payer: Self-pay | Admitting: Physician Assistant

## 2015-10-12 NOTE — Telephone Encounter (Signed)
Attempt to reach pt via phone, the mailbox is full and cannot accept any messages at this time; our office has not placed referral to Dr. Vertell Limber, but Dr. [name] [Neuro] did place referral to Neuro Rehab, which could be Dr. Emelia Loron

## 2015-10-12 NOTE — Telephone Encounter (Signed)
Caller name: [place]  Relation to pt: self Call back number: [phone] Pharmacy:  Reason for call: Pt came in office stating had received a call that was referred to Dr Vertell Limber (Dr that has to do with back pain) pt states would like to know from her PCP why she was referred to this doctor. Please advise.

## 2015-10-13 NOTE — Telephone Encounter (Signed)
Attempted also to reach patient this AM. No answer. LMOM for callback regarding this matter.

## 2015-10-13 NOTE — Telephone Encounter (Signed)
Discussed with patient

## 2015-10-31 ENCOUNTER — Encounter: Payer: Self-pay | Admitting: Family Medicine

## 2015-10-31 ENCOUNTER — Ambulatory Visit (INDEPENDENT_AMBULATORY_CARE_PROVIDER_SITE_OTHER): Payer: 59 | Admitting: Family Medicine

## 2015-10-31 DIAGNOSIS — M797 Fibromyalgia: Secondary | ICD-10-CM

## 2015-10-31 DIAGNOSIS — G9332 Myalgic encephalomyelitis/chronic fatigue syndrome: Secondary | ICD-10-CM | POA: Insufficient documentation

## 2015-10-31 MED ORDER — ORPHENADRINE CITRATE ER 100 MG PO TB12: 100.0000 mg | ORAL_TABLET | Freq: Two times a day (BID) | ORAL | 0 refills | Status: DC | PRN

## 2015-10-31 NOTE — Progress Notes (Signed)
Subjective:  Patient ID: Jasmine [name], female    DOB: 15-Dec-1955  Age: 60 y.o. MRN: [id number]  CC: Pain  HPI:  60 and-year-old female with fibromyalgia presents with complaints of pain.  Patient reports that for the past 2 days she's been experiencing worsening pain of her arms, back, neck, legs. She has taken her gabapentin, tramadol, and Norflex without significant improvement. She is unsure why she is having worsening in her pain. No recent fall, trauma, injury. No other known inciting factor. Exacerbated by movement. No relieving factors. No other complaints at this time. Patient requesting refill on her Norflex.  Social Hx   Social History   Social History  . Marital status: Legally Separated    Spouse name: N/A  . Number of children: 2  . Years of education: 2 yrs coll   Occupational History  . Disabled    Social History Main Topics  . Smoking status: Former Smoker    Packs/day: 1.00    Years: 20.00    Types: Cigarettes    Quit date: 02/29/2004  . Smokeless tobacco: Never Used  . Alcohol use 0.0 oz/week     Comment: "glass of wine q once in awhile; not very often; do it on special occasion"  . Drug use: No  . Sexual activity: No   Other Topics Concern  . None   Social History Narrative   Completed 2 years of college.    On disability.   Lives at home with her husband.   No caffeine use.   Left-handed.   Three children, 2 biological - 1 adopted.    Review of Systems  Constitutional: Negative.   Musculoskeletal: Positive for arthralgias and myalgias.    Objective:  BP 138/90 (BP Location: Left Arm, Patient Position: Sitting, Cuff Size: Normal)   Pulse 66   Temp 97.7 F (36.5 C) (Oral)   Resp 20   Ht 5\' 3"  (1.6 m)   Wt 180 lb 8 oz (81.9 kg)   SpO2 98%   BMI 31.97 kg/m   BP/Weight 10/31/2015 [id number]  Systolic BP 0000000 123456 123456  Diastolic BP 90 53 79  Wt. (Lbs) 180.5 180.5 180  BMI 31.97 31.98 31.89    Physical Exam  Constitutional:    Chronically ill-appearing female. Appears older than stated age.  Cardiovascular: Normal rate and regular rhythm.   Pulmonary/Chest: Effort normal and breath sounds normal.  Musculoskeletal:  Back - exquisitely tender to palpation. Appears out of proportion to force applied. Patient also tender with palpation of the arms and other musculature.  Neurological: She is alert.  Psychiatric:  Flat affect.  Vitals reviewed.   Lab Results  Component Value Date   WBC 8.8 06/02/2015   HGB 13.6 06/02/2015   HCT 41.1 06/02/2015   PLT 252.0 06/02/2015   GLUCOSE 106 (H) 06/02/2015   CHOL  02/12/2008    186        ATP III CLASSIFICATION:  <200     mg/dL   Desirable  200-239  mg/dL   Borderline High  >=240    mg/dL   High   TRIG 41 02/12/2008   HDL 47 02/12/2008   LDLCALC (H) 02/12/2008    131        Total Cholesterol/HDL:CHD Risk Coronary Heart Disease Risk Table                     Men   Women  1/2 Average Risk   3.4  3.3   ALT 23 06/02/2015   AST 20 06/02/2015   NA 142 06/02/2015   K 4.3 06/02/2015   CL 109 06/02/2015   CREATININE 0.61 06/02/2015   BUN 11 06/02/2015   CO2 29 06/02/2015   TSH 1.59 12/05/2014   INR 1.1 (H) 12/09/2014   HGBA1C 5.6 07/08/2011    Assessment & Plan:   Problem List Items Addressed This Visit    Fibromyalgia    Chronic problem. Acute worsening. Norflex refill today. Advise continue use of gabapentin and tramadol. Needs to follow-up with PCP. Would recommend referral to pain management.       Other Visit Diagnoses   None.     Meds ordered this encounter  Medications  . orphenadrine (NORFLEX) 100 MG tablet    Sig: Take 1 tablet (100 mg total) by mouth 2 (two) times daily as needed for muscle spasms.    Dispense:  30 tablet    Refill:  0    Follow-up: PRN  [place]

## 2015-10-31 NOTE — Progress Notes (Signed)
Pre visit review using our clinic review tool, if applicable. No additional management support is needed unless otherwise documented below in the visit note. 

## 2015-10-31 NOTE — Assessment & Plan Note (Signed)
Chronic problem. Acute worsening. Norflex refill today. Advise continue use of gabapentin and tramadol. Needs to follow-up with PCP. Would recommend referral to pain management.

## 2015-10-31 NOTE — Patient Instructions (Signed)
Continue the Norflex, Tramadol and Gabapentin.  Please follow up with your PCP regarding this.  Take care  Dr. Lacinda Axon

## 2015-11-01 ENCOUNTER — Other Ambulatory Visit: Payer: Self-pay | Admitting: Physician Assistant

## 2015-11-03 NOTE — Telephone Encounter (Signed)
Rx request to pharmacy/SLS  

## 2015-11-04 ENCOUNTER — Telehealth: Payer: Self-pay | Admitting: *Deleted

## 2015-11-04 ENCOUNTER — Other Ambulatory Visit: Payer: Self-pay | Admitting: Physician Assistant

## 2015-11-04 NOTE — Telephone Encounter (Signed)
[facility] note received via fax  Call:   Date: 10/31/15 Time: 1049   Caller: Self Return number: [phone]  Nurse: Ellan Lambert, RN  Chief Complaint: Muscle pain  Reason for call: Caller state is suffering with severe muscle pain, Dr prescribes medicine and is on her last one, needing assistance. She is taking Norflex. She has fibromyalgia, pain is al over, she rates it a 10/10.  Related visit to physician within the last 2 weeks: No  Guideline: Muscle Aches and Body Pain; [1] SEVERE pain (e.g., excruciating, unable to do any normal activities) AND [2] not improved 2 hours after pain medicine  Disposition: See Physician with [facility] (or PCP triage)  **Pt seen in by Dr. Thersa Salt 10/31/15**

## 2015-11-06 ENCOUNTER — Ambulatory Visit (INDEPENDENT_AMBULATORY_CARE_PROVIDER_SITE_OTHER): Payer: 59 | Admitting: Physician Assistant

## 2015-11-06 ENCOUNTER — Encounter: Payer: Self-pay | Admitting: Physician Assistant

## 2015-11-06 VITALS — BP 118/88 | HR 97 | Temp 98.4°F | Resp 16 | Ht 63.0 in | Wt 177.5 lb

## 2015-11-06 DIAGNOSIS — M797 Fibromyalgia: Secondary | ICD-10-CM

## 2015-11-06 DIAGNOSIS — M7711 Lateral epicondylitis, right elbow: Secondary | ICD-10-CM

## 2015-11-06 MED ORDER — TRAMADOL HCL 50 MG PO TABS: 50.0000 mg | ORAL_TABLET | Freq: Two times a day (BID) | ORAL | 0 refills | Status: DC | PRN

## 2015-11-06 MED ORDER — ORPHENADRINE CITRATE ER 100 MG PO TB12: 100.0000 mg | ORAL_TABLET | Freq: Two times a day (BID) | ORAL | 0 refills | Status: DC | PRN

## 2015-11-06 NOTE — Progress Notes (Signed)
Patient presents to clinic today c/o increased fibromyalgia pain. Has been more active recently. Has been staying busy deep cleaning the home and with family events. Is up and moving throughout the day. Has noted increase in fibromyalgia pain with pain in hands bilaterally. Denies numbness, tingling of hands. Is taking Norflex once daily.   Past Medical History:  Diagnosis Date  . Anxiety   . Asthma   . Chronic high back pain   . Double vision   . Heart murmur   . High cholesterol   . History of bronchitis    "last time ~ 2009, before I had pneumonia"  . Hyperparathyroidism ([place])   . Hypertension   . Hypoglycemia   . Kidney stone   . Meniscus tear    Right  . Migraine    "used to have them often"  . Migraine 08/07/2015  . Pneumonia ~ 2009  . PTSD (post-traumatic stress disorder)   . Schizophrenia ([place])    treated by Dr. Casimiro Needle  . Seizure ([place]) 07/07/11  . Seizures ([place])    last seizure 3 years ago, not on AED  . Stroke ([place]) 07/08/11   "I've had mini strokes before; left side of face  is more down than right "  . Syncope and collapse    One episode - 07/10/15    Current Outpatient Prescriptions on File Prior to Visit  Medication Sig Dispense Refill  . albuterol (PROVENTIL) (2.5 MG/3ML) 0.083% nebulizer solution Take 3 mLs (2.5 mg total) by nebulization every 6 (six) hours as needed for wheezing or shortness of breath. 75 mL 5  . fluticasone (FLONASE) 50 MCG/ACT nasal spray PLACE TWO SPRAY(S) IN EACH NOSTRIL ONCE DAILY 16 g 0  . fluticasone-salmeterol (ADVAIR HFA) 115-21 MCG/ACT inhaler Inhale 1 puff into the lungs 2 (two) times daily. 1 Inhaler 5  . gabapentin (NEURONTIN) 100 MG capsule TAKE ONE CAPSULE BY MOUTH ONCE DAILY IN THE MORNING, ONE CAPSULE AT NOON, AND THREE CAPSULES IN THE EVENING (Patient taking differently: TAKE ONE CAPSULE BY MOUTH ONCE DAILY IN THE MORNING, ONE CAPSULE AT NOON, AND ONE CAPSULES IN THE EVENING) 150 capsule 2  . levalbuterol (XOPENEX HFA) 45  MCG/ACT inhaler INHALE 2 PUFFS EVERY 4-6 HOURS AS NEEDED 1 Inhaler 5  . LORazepam (ATIVAN PO) Take 1 mg by mouth as needed (Takes 0.5mg ).     . losartan (COZAAR) 25 MG tablet TAKE ONE TABLET BY MOUTH TWICE DAILY 60 tablet 5  . magnesium 30 MG tablet Take 30 mg by mouth daily. Reported on 07/24/2015    . metoprolol succinate (TOPROL-XL) 25 MG 24 hr tablet Take 3 tablets (75 mg total) by mouth daily. 270 tablet 1  . Multiple Vitamin (THERA) TABS Take by mouth.    . Potassium Gluconate 595 MG CAPS Take 595 mg by mouth daily as needed. Reported on 08/13/2015    . promethazine (PHENERGAN) 12.5 MG tablet Take 1 tablet (12.5 mg total) by mouth every 6 (six) hours as needed. Reported on 03/04/2015 30 tablet 1  . rosuvastatin (CRESTOR) 20 MG tablet TAKE ONE TABLET BY MOUTH ONCE DAILY IN THE EVENING 30 tablet 3  . temazepam (RESTORIL) 15 MG capsule Take 30 mg by mouth at bedtime as needed.    . zolpidem (AMBIEN) 10 MG tablet Take 20 mg by mouth at bedtime as needed.     No current facility-administered medications on file prior to visit.     Allergies  Allergen Reactions  . Sulfa Antibiotics  Hives  . Metronidazole Nausea And Vomiting  . Acyclovir And Related   . Benadryl [Diphenhydramine Hcl]     Severe emotional reaction and doesn't work well with Pt. Past history  . Cephalosporins Hives  . Codeine Other (See Comments)    Makes patient feel odd ; "sometimes mild; sometimes severe reaction; mostly severe"  . Cyclobenzaprine Other (See Comments)    Muscle Aches.  . Diclofenac Sodium Nausea And Vomiting  . Nitrofurantoin Monohyd Macro     Felt funny  . Prednisone Other (See Comments)    migraine  . Tizanidine     Other reaction(s): Other (See Comments) insomnia  . Baclofen Nausea Only  . Meloxicam     Other reaction(s): Confusion  . Sulfamethoxazole Rash    Family History  Problem Relation Age of Onset  . Heart attack Father 20    Living  . Leukemia Mother 73    Deceased  . Arthritis  Father   . Skin cancer Father   . Hypertension Father   . Hyperlipidemia Father   . Alzheimer's disease Paternal Aunt     X2  . Stomach cancer Paternal Aunt     x1  . Arthritis/Rheumatoid Paternal Aunt     x1  . Neuropathy Brother     Peripheal  . Fibromyalgia Sister   . HIV Sister   . Arthritis/Rheumatoid Sister   . Diabetes Paternal Grandmother     Social History   Social History  . Marital status: Legally Separated    Spouse name: N/A  . Number of children: 2  . Years of education: 2 yrs coll   Occupational History  . Disabled    Social History Main Topics  . Smoking status: Former Smoker    Packs/day: 1.00    Years: 20.00    Types: Cigarettes    Quit date: 02/29/2004  . Smokeless tobacco: Never Used  . Alcohol use 0.0 oz/week     Comment: "glass of wine q once in awhile; not very often; do it on special occasion"  . Drug use: No  . Sexual activity: No   Other Topics Concern  . None   Social History Narrative   Completed 2 years of college.    On disability.   Lives at home with her husband.   No caffeine use.   Left-handed.   Three children, 2 biological - 1 adopted.   Review of Systems - See HPI.  All other ROS are negative.  BP 118/88 (BP Location: Right Arm, Patient Position: Sitting, Cuff Size: Normal)   Pulse 97   Temp 98.4 F (36.9 C) (Oral)   Resp 16   Ht 5\' 3"  (1.6 m)   Wt 177 lb 8 oz (80.5 kg)   SpO2 97%   BMI 31.44 kg/m   Physical Exam  Constitutional: She is oriented to person, place, and time and well-developed, well-nourished, and in no distress.  HENT:  Head: Normocephalic and atraumatic.  Eyes: Conjunctivae are normal.  Cardiovascular: Normal rate, regular rhythm, normal heart sounds and intact distal pulses.   Pulmonary/Chest: Effort normal and breath sounds normal. No respiratory distress. She has no wheezes. She has no rales. She exhibits no tenderness.  Musculoskeletal:       Right elbow: She exhibits normal range of motion  and no swelling. Tenderness found. Lateral epicondyle tenderness noted.  Neurological: She is alert and oriented to person, place, and time.  Skin: Skin is warm and dry. No rash noted.  Psychiatric: Affect  normal.  Vitals reviewed.  Assessment/Plan: Fibromyalgia Norflex refilled. She is to take as directed. She is also to take Gabapentin as directed. Short Rx Tramadol given for acute flare. If any chronic pain regimen needed, will have to be seen by pain management.  Tennis elbow syndrome Rx Tramadol. Bracing recommended. RICE.She will get from pharmacy. Supportive measures and exercises reviewed. FU if not resolving.    Leeanne Rio, PA-C

## 2015-11-06 NOTE — Patient Instructions (Signed)
Please continue chronic medications as directed. Take the Tramadol as directed only if needed for severe pain. No heavy lifting or overexertion.  Please get the brace we discussed and wear on the R forearm to help with pain.  Follow-up if symptoms are not calming down. We will need to set you up with Pain Management.

## 2015-11-09 ENCOUNTER — Other Ambulatory Visit: Payer: Self-pay | Admitting: Physician Assistant

## 2015-11-09 DIAGNOSIS — M797 Fibromyalgia: Secondary | ICD-10-CM

## 2015-11-09 DIAGNOSIS — M7710 Lateral epicondylitis, unspecified elbow: Secondary | ICD-10-CM | POA: Insufficient documentation

## 2015-11-09 NOTE — Assessment & Plan Note (Signed)
Norflex refilled. She is to take as directed. She is also to take Gabapentin as directed. Short Rx Tramadol given for acute flare. If any chronic pain regimen needed, will have to be seen by pain management.

## 2015-11-09 NOTE — Assessment & Plan Note (Signed)
Rx Tramadol. Bracing recommended. RICE.She will get from pharmacy. Supportive measures and exercises reviewed. FU if not resolving.

## 2015-11-11 ENCOUNTER — Telehealth: Payer: Self-pay | Admitting: Physician Assistant

## 2015-11-11 NOTE — Telephone Encounter (Signed)
Caller name: Relationship to patient: Self Can be reached: [phone] Pharmacy:  Reason for call: Patient request call back to discuss having acupuncture

## 2015-11-11 NOTE — Telephone Encounter (Signed)
Attempted to reach patient. No answer. LMOM for callback.   If she calls back, please get me. If I am unavailable, please get more information into her request. Thank you.

## 2015-11-16 ENCOUNTER — Telehealth: Payer: Self-pay | Admitting: Physician Assistant

## 2015-11-16 ENCOUNTER — Other Ambulatory Visit: Payer: Self-pay | Admitting: Family

## 2015-11-16 NOTE — Telephone Encounter (Signed)
Copy & Pasted in to Original note RE: Accupuncture/SLS 09/18

## 2015-11-16 NOTE — Telephone Encounter (Signed)
Relation to [id number] Call back [phone]   Reason for call:  Patient requesting U/S of her arm and requesting a referral to gastro due to digestive concerns. Please advise

## 2015-11-16 NOTE — Telephone Encounter (Signed)
LMOM with contact name and number for return call RE: information needed on request per provider instructions/SLS 09/18

## 2015-11-16 NOTE — Telephone Encounter (Signed)
New Note: Jasmine [name]   11/16/15 10:39 AM  Note    Relation to [id number] Call back number:[phone]   Reason for call:  Patient requesting U/S of her arm and requesting a referral to gastro due to digestive concerns. Please advise

## 2015-11-16 NOTE — Telephone Encounter (Signed)
See other phonenote. Multiple telephone notes have been opened for the same conversation.

## 2015-11-16 NOTE — Telephone Encounter (Signed)
°  Pt came in office clarifing that she does NOT need the accupuncture procedure done but does need a referral for gastro due to digestive concerns and still needing an Ultrasound for right arm since she does has some issue. Pt states that this has helped her before for back pains, she would like in the future to do the accupuncture if the U/S does not help. Please advise.

## 2015-11-18 NOTE — Telephone Encounter (Signed)
LMOM with contact name and number for return call RE: requested orders and further provider instructions/SLS 09/20

## 2015-11-19 NOTE — Telephone Encounter (Signed)
Pt returned call

## 2015-11-20 NOTE — Telephone Encounter (Signed)
Returned Call  Received: Today  Message Contents  Jasmine [name] sent to [place], [place]  Phone Number: [phone]        Please call back

## 2015-11-20 NOTE — Telephone Encounter (Addendum)
Added Notes from Duplicate Phone message w/Accupuncture note. Brunetta Jeans, PA-C  3:03 PM  Note    See other phonenote. Multiple telephone notes have been opened for the same conversation.     10:02 AM  Note    LMOM with contact name and number for return call RE: information needed on request per provider instructions/SLS 09/18      10:02 AM    You attempted to contact Jonelle Sidle D. Grasmick (Left Message)   12:45 PM  Note    Attempted to reach patient. No answer. LMOM for callback.   If she calls back, please get me. If I am unavailable, please get more information into her request. Thank you.     Quintin Alto   9:17 AM  Note    Caller name: Relationship to patient: Self Can be reached: [phone] Pharmacy:  Reason for call: Patient request call back to discuss having acupuncture       9:17 AM    Ulyess Mort. Romilda Garret contacted [facility]

## 2015-11-23 NOTE — Telephone Encounter (Signed)
LMOM with contact name and number [for return call] RE: requested Referrals per provider instructions/SLS 09/25

## 2015-11-24 NOTE — Telephone Encounter (Signed)
Patient returned your call.   Patient Relation: Self Patient Phone: [phone]

## 2015-11-27 NOTE — Telephone Encounter (Addendum)
Please ask patient when she calls back: 1) what her digestive issues are for requesting Gastroenterology referral, and 2) what the issue is with her Right arm for requesting the Ultrasound; since we have had numerous phone calls trying to reach this patient and missing her call backs per provider/SLS 09/29

## 2015-11-30 NOTE — Telephone Encounter (Signed)
Patient returned call

## 2015-11-30 NOTE — Telephone Encounter (Signed)
Called patient and spoke with her about her ongoing issues: 1) GI Issues - patient has appointment scheduled with former providers at [place] for 12/01/15 with Dr. Oletta Lamas 2) Right Arm - patient states that she banged her arm on "barricade between shower & bathtub" x3 wks ago and had pain & bruising that has subsided Also, patient wanted to let provider know that she has upcoming knee surgery with Dr. Maxie Better that will be placing flex gel in knee and cleaning out via arthroscopy [does not need clearance appt]; does not have her Sx date as of present. Also, apologized for the length of time it has taken [place] to finally get in touch with the other, pt states she did not get the return call messages that have been going out since 11/16/15; informed her that our phone number comes up as restricted number for most people and apologized once more if this had caused any communication problem, pt understood/SLS 10/02

## 2015-11-30 NOTE — Telephone Encounter (Signed)
Attempted to reach patient again. No answer. LMOM for callback -- If she calls again, get either myself, Ivin Booty or one of the RNs on the phone so we can determine exactly what she needs and take care of things. We have been playing phone tag for weeks.

## 2015-12-02 ENCOUNTER — Telehealth: Payer: Self-pay | Admitting: Physician Assistant

## 2015-12-02 ENCOUNTER — Other Ambulatory Visit: Payer: Self-pay | Admitting: Gastroenterology

## 2015-12-02 DIAGNOSIS — R1032 Left lower quadrant pain: Secondary | ICD-10-CM

## 2015-12-02 NOTE — Telephone Encounter (Signed)
Pt dropped off copy of document for PCP to have on pt chart [facility]-MRI on right knee) and to see it and left a small written note for PCP to see. Document put at front office tray.

## 2015-12-04 ENCOUNTER — Ambulatory Visit
Admission: RE | Admit: 2015-12-04 | Discharge: 2015-12-04 | Disposition: A | Discharge status: Home / Self Care | Payer: 59 | Source: Ambulatory Visit | Attending: Gastroenterology | Admitting: Gastroenterology

## 2015-12-04 DIAGNOSIS — R1032 Left lower quadrant pain: Secondary | ICD-10-CM

## 2015-12-04 MED ORDER — IOPAMIDOL (ISOVUE-300) INJECTION 61%
100.0000 mL | Freq: Once | INTRAVENOUS | Status: AC | PRN
  Administered 2015-12-04: 100 mL via INTRAVENOUS

## 2015-12-14 ENCOUNTER — Encounter: Payer: Self-pay | Admitting: Family Medicine

## 2015-12-14 ENCOUNTER — Ambulatory Visit (INDEPENDENT_AMBULATORY_CARE_PROVIDER_SITE_OTHER): Payer: 59 | Admitting: Family Medicine

## 2015-12-14 VITALS — BP 128/82 | HR 78 | Temp 98.8°F | Ht 62.0 in | Wt 169.0 lb

## 2015-12-14 DIAGNOSIS — G43109 Migraine with aura, not intractable, without status migrainosus: Secondary | ICD-10-CM | POA: Diagnosis not present

## 2015-12-14 MED ORDER — SUMATRIPTAN SUCCINATE 50 MG PO TABS: 50.0000 mg | ORAL_TABLET | ORAL | 0 refills | Status: DC | PRN

## 2015-12-14 MED ORDER — KETOROLAC TROMETHAMINE 30 MG/ML IJ SOLN
30.0000 mg | Freq: Once | INTRAMUSCULAR | Status: AC
  Administered 2015-12-14: 30 mg via INTRAMUSCULAR

## 2015-12-14 MED ORDER — PROMETHAZINE HCL 12.5 MG PO TABS: 12.5000 mg | ORAL_TABLET | Freq: Four times a day (QID) | ORAL | 0 refills | Status: DC | PRN

## 2015-12-14 NOTE — Progress Notes (Signed)
Pre visit review using our clinic review tool, if applicable. No additional management support is needed unless otherwise documented below in the visit note. 

## 2015-12-14 NOTE — Progress Notes (Signed)
Chief Complaint  Patient presents with  . Dizziness    Pt reports having a migraine with nausea and radiating into the back     Subjective: Patient is a 60 y.o. female here for migraine and nausea.  5 days, does have hx of migraines with dizziness. Around the entire head, stabbing pain. Also is having sensitivity to the light. She has hearing problems so is not having any sound sensitivity. Has tried phenergan for nausea that is helpful. Has not tried anything else for the pain. Denies vision changes, weakness, or difficulties with speech. Her last migraine was around 12 mo ago.   ROS: Neuro: As noted in HPI HEENT: No vision changes  Family History  Problem Relation Age of Onset  . Heart attack Father 25    Living  . Leukemia Mother 21    Deceased  . Arthritis Father   . Skin cancer Father   . Hypertension Father   . Hyperlipidemia Father   . Alzheimer's disease Paternal Aunt     X2  . Stomach cancer Paternal Aunt     x1  . Arthritis/Rheumatoid Paternal Aunt     x1  . Neuropathy Brother     Peripheal  . Fibromyalgia Sister   . HIV Sister   . Arthritis/Rheumatoid Sister   . Diabetes Paternal Grandmother    Past Medical History:  Diagnosis Date  . Anxiety   . Asthma   . Chronic high back pain   . Double vision   . Heart murmur   . High cholesterol   . History of bronchitis    "last time ~ 2009, before I had pneumonia"  . Hyperparathyroidism ([place])   . Hypertension   . Hypoglycemia   . Kidney stone   . Meniscus tear    Right  . Migraine    "used to have them often"  . Migraine 08/07/2015  . Pneumonia ~ 2009  . PTSD (post-traumatic stress disorder)   . Schizophrenia ([place])    treated by Dr. Casimiro Needle  . Seizure ([place]) 07/07/11  . Seizures ([place])    last seizure 3 years ago, not on AED  . Stroke ([place]) 07/08/11   "I've had mini strokes before; left side of face  is more down than right "  . Syncope and collapse    One episode - 07/10/15   Allergies  Allergen  Reactions  . Sulfa Antibiotics Hives  . Metronidazole Nausea And Vomiting  . Acyclovir And Related   . Benadryl [Diphenhydramine Hcl]     Severe emotional reaction and doesn't work well with Pt. Past history  . Cephalosporins Hives  . Codeine Other (See Comments)    Makes patient feel odd ; "sometimes mild; sometimes severe reaction; mostly severe"  . Cyclobenzaprine Other (See Comments)    Muscle Aches.  . Diclofenac Sodium Nausea And Vomiting  . Linzess [Linaclotide] Diarrhea  . Nitrofurantoin Monohyd Macro     Felt funny  . Prednisone Other (See Comments)    migraine  . Tizanidine     Other reaction(s): Other (See Comments) insomnia  . Baclofen Nausea Only  . Meloxicam     Other reaction(s): Confusion  . Sulfamethoxazole Rash    Current Outpatient Prescriptions:  .  albuterol (PROVENTIL) (2.5 MG/3ML) 0.083% nebulizer solution, Take 3 mLs (2.5 mg total) by nebulization every 6 (six) hours as needed for wheezing or shortness of breath., Disp: 75 mL, Rfl: 5 .  fluticasone (FLONASE) 50 MCG/ACT nasal spray, USE TWO SPRAY(S) IN  EACH NOSTRIL ONCE DAILY, Disp: 16 g, Rfl: 5 .  fluticasone-salmeterol (ADVAIR HFA) 115-21 MCG/ACT inhaler, Inhale 1 puff into the lungs 2 (two) times daily., Disp: 1 Inhaler, Rfl: 5 .  gabapentin (NEURONTIN) 100 MG capsule, TAKE ONE CAPSULE BY MOUTH ONCE DAILY IN THE MORNING, ONE CAPSULE AT NOON, AND THREE CAPSULES IN THE EVENING (Patient taking differently: TAKE ONE CAPSULE BY MOUTH ONCE DAILY IN THE MORNING, ONE CAPSULE AT NOON, AND ONE CAPSULES IN THE EVENING), Disp: 150 capsule, Rfl: 2 .  HYDROcodone-acetaminophen (NORCO/VICODIN) 5-325 MG tablet, Take 1 tablet by mouth as needed., Disp: , Rfl:  .  levalbuterol (XOPENEX HFA) 45 MCG/ACT inhaler, INHALE 2 PUFFS EVERY 4-6 HOURS AS NEEDED, Disp: 1 Inhaler, Rfl: 5 .  losartan (COZAAR) 25 MG tablet, TAKE ONE TABLET BY MOUTH TWICE DAILY, Disp: 60 tablet, Rfl: 5 .  metoprolol succinate (TOPROL-XL) 25 MG 24 hr tablet,  Take 3 tablets (75 mg total) by mouth daily., Disp: 270 tablet, Rfl: 1 .  Multiple Vitamin (THERA) TABS, Take by mouth., Disp: , Rfl:  .  orphenadrine (NORFLEX) 100 MG tablet, Take 1 tablet (100 mg total) by mouth 2 (two) times daily as needed for muscle spasms., Disp: 60 tablet, Rfl: 0 .  promethazine (PHENERGAN) 12.5 MG tablet, Take 1 tablet (12.5 mg total) by mouth every 6 (six) hours as needed for nausea. Reported on 03/04/2015, Disp: 20 tablet, Rfl: 0 .  rosuvastatin (CRESTOR) 20 MG tablet, TAKE ONE TABLET BY MOUTH ONCE DAILY IN THE EVENING, Disp: 30 tablet, Rfl: 3 .  temazepam (RESTORIL) 15 MG capsule, Take 30 mg by mouth at bedtime as needed., Disp: , Rfl:  .  zolpidem (AMBIEN) 10 MG tablet, Take 20 mg by mouth at bedtime as needed., Disp: , Rfl:  .  LORazepam (ATIVAN PO), Take 1 mg by mouth as needed (Takes 0.5mg ). , Disp: , Rfl:  .  magnesium 30 MG tablet, Take 30 mg by mouth daily. Reported on 07/24/2015, Disp: , Rfl:  .  Potassium Gluconate 595 MG CAPS, Take 595 mg by mouth daily as needed. Reported on 08/13/2015, Disp: , Rfl:  .  SUMAtriptan (IMITREX) 50 MG tablet, Take 1 tablet (50 mg total) by mouth every 2 (two) hours as needed for migraine. May repeat in 2 hours if headache persists or recurs., Disp: 10 tablet, Rfl: 0  Objective: BP 128/82 (BP Location: Left Arm, Patient Position: Sitting, Cuff Size: Small)   Pulse 78   Temp 98.8 F (37.1 C) (Oral)   Ht 5\' 2"  (1.575 m)   Wt 169 lb (76.7 kg)   SpO2 98%   BMI 30.91 kg/m  General: Awake, appears stated age HEENT: MMM, EOMi, PERRLA, does have some sensitivity to the light Heart: RRR, no murmurs Lungs: CTAB, no rales, wheezes or rhonchi. Normal effort Neuro: biceps DTR 1/4 b/l, patellar DTR 3/4 b/l, calcaneal DTR 1/4 b/l wo clonus, speech seemed slurred Psych: Age appropriate judgment and insight, normal affect and mood  Assessment and Plan: Migraine with aura and without status migrainosus, not intractable - Plan: promethazine  (PHENERGAN) 12.5 MG tablet, ketorolac (TORADOL) 30 MG/ML injection 30 mg, SUMAtriptan (IMITREX) 50 MG tablet  Orders as above.  Refill short course of Phenergan for nausea. Has done well with Toradol in the past, smaller dose given her many sensitivities to medicines. Slurred speech likely result of polypharmacy from specialists.  F/u prn. The patient voiced understanding and agreement to the plan.  [place], DO 12/14/15  5:08 PM

## 2015-12-15 ENCOUNTER — Telehealth: Payer: Self-pay | Admitting: Physician Assistant

## 2015-12-15 NOTE — Telephone Encounter (Signed)
Patient called back to follow up on envelope left at front desk. Request a call from provider about contents of the envelope.

## 2015-12-15 NOTE — Telephone Encounter (Signed)
Pt dropped off Yellow big envelope for PCP to have (with rx in it) pt mentioned wrote a note that is in the envelope and for provider to see. Envelope put at front office tray.

## 2015-12-15 NOTE — Telephone Encounter (Signed)
Will not be addressed today, medication was in package px by other providers; this is against policy & procedure, will be locked in cabinet overnight/SLS 10/17

## 2015-12-16 ENCOUNTER — Encounter: Payer: Self-pay | Admitting: Physician Assistant

## 2015-12-16 ENCOUNTER — Ambulatory Visit (INDEPENDENT_AMBULATORY_CARE_PROVIDER_SITE_OTHER): Payer: 59 | Admitting: Physician Assistant

## 2015-12-16 VITALS — BP 132/78 | HR 90 | Temp 98.3°F | Resp 16 | Ht 62.0 in | Wt 169.0 lb

## 2015-12-16 DIAGNOSIS — G43109 Migraine with aura, not intractable, without status migrainosus: Secondary | ICD-10-CM

## 2015-12-16 NOTE — Telephone Encounter (Signed)
Called patient. No answer. LMOM for return call. Recommended she schedule an appointment so this can be discussed.

## 2015-12-16 NOTE — Patient Instructions (Signed)
Do not skip meals and stay well hydrated. Take medications as directed. Try the exercise below so that we can resolve any positional vertigo.  Epley Maneuver Self-Care WHAT IS THE EPLEY MANEUVER? The Epley maneuver is an exercise you can do to relieve symptoms of benign paroxysmal positional vertigo (BPPV). This condition is often just referred to as vertigo. BPPV is caused by the movement of tiny crystals (canaliths) inside your inner ear. The accumulation and movement of canaliths in your inner ear causes a sudden spinning sensation (vertigo) when you move your head to certain positions. Vertigo usually lasts about 30 seconds. BPPV usually occurs in just one ear. If you get vertigo when you lie on your left side, you probably have BPPV in your left ear. Your health care provider can tell you which ear is involved.  BPPV may be caused by a head injury. Many people older than 50 get BPPV for unknown reasons. If you have been diagnosed with BPPV, your health care provider may teach you how to do this maneuver. BPPV is not life threatening (benign) and usually goes away in time.  WHEN SHOULD I PERFORM THE EPLEY MANEUVER? You can do this maneuver at home whenever you have symptoms of vertigo. You may do the Epley maneuver up to 3 times a day until your symptoms of vertigo go away. HOW SHOULD I DO THE EPLEY MANEUVER? 1. Sit on the edge of a bed or table with your back straight. Your legs should be extended or hanging over the edge of the bed or table.  2. Turn your head halfway toward the affected ear.  3. Lie backward quickly with your head turned until you are lying flat on your back. You may want to position a pillow under your shoulders.  4. Hold this position for 30 seconds. You may experience an attack of vertigo. This is normal. Hold this position until the vertigo stops. 5. Then turn your head to the opposite direction until your unaffected ear is facing the floor.  6. Hold this position for  30 seconds. You may experience an attack of vertigo. This is normal. Hold this position until the vertigo stops. 7. Now turn your whole body to the same side as your head. Hold for another 30 seconds.  8. You can then sit back up. ARE THERE RISKS TO THIS MANEUVER? In some cases, you may have other symptoms (such as changes in your vision, weakness, or numbness). If you have these symptoms, stop doing the maneuver and call your health care provider. Even if doing these maneuvers relieves your vertigo, you may still have dizziness. Dizziness is the sensation of light-headedness but without the sensation of movement. Even though the Epley maneuver may relieve your vertigo, it is possible that your symptoms will return within 5 years. WHAT SHOULD I DO AFTER THIS MANEUVER? After doing the Epley maneuver, you can return to your normal activities. Ask your doctor if there is anything you should do at home to prevent vertigo. This may include:  Sleeping with two or more pillows to keep your head elevated.  Not sleeping on the side of your affected ear.  Getting up slowly from bed.  Avoiding sudden movements during the day.  Avoiding extreme head movement, like looking up or bending over.  Wearing a cervical collar to prevent sudden head movements. WHAT SHOULD I DO IF MY SYMPTOMS GET WORSE? Call your health care provider if your vertigo gets worse. Call your provider right way if you  have other symptoms, including:   Nausea.  Vomiting.  Headache.  Weakness.  Numbness.  Vision changes.   This information is not intended to replace advice given to you by your health care provider. Make sure you discuss any questions you have with your health care provider.   Document Released: 02/19/2013 Document Reviewed: 02/19/2013 Elsevier Interactive Patient Education [employer].

## 2015-12-16 NOTE — Telephone Encounter (Signed)
Patient has appointment with PCP at 3:00pm today/SLS 10/18

## 2015-12-17 ENCOUNTER — Telehealth: Payer: Self-pay | Admitting: Physician Assistant

## 2015-12-17 DIAGNOSIS — R42 Dizziness and giddiness: Secondary | ICD-10-CM

## 2015-12-17 NOTE — Telephone Encounter (Signed)
Please advise 

## 2015-12-17 NOTE — Progress Notes (Signed)
Patient presents to clinic today for follow-up of migraine and vertigo. Patient was initially evaluated by another provider on 12/14/15 for acute migraine. Patient was given Toradol injection to abort migraine. Was sent home with Rx for phenergan and Imitrex. Patient states she has not taken medications after reading list of side effect. Wanted to discuss medications with PCP before taking. Since last visit, patient endorses she has been feeling well overall. Has only had one mild headache since visit. Denies change to stress level. Denies other change to medications. Patient endorses she has been busy and has not been eating regularly which she questions may be a trigger. Denies any new symptoms or concerns.   Past Medical History:  Diagnosis Date  . Anxiety   . Asthma   . Chronic high back pain   . Double vision   . Heart murmur   . High cholesterol   . History of bronchitis    "last time ~ 2009, before I had pneumonia"  . Hyperparathyroidism ([place])   . Hypertension   . Hypoglycemia   . Kidney stone   . Meniscus tear    Right  . Migraine    "used to have them often"  . Migraine 08/07/2015  . Pneumonia ~ 2009  . PTSD (post-traumatic stress disorder)   . Schizophrenia ([place])    treated by Dr. Casimiro Needle  . Seizure ([place]) 07/07/11  . Seizures ([place])    last seizure 3 years ago, not on AED  . Stroke ([place]) 07/08/11   "I've had mini strokes before; left side of face  is more down than right "  . Syncope and collapse    One episode - 07/10/15    Current Outpatient Prescriptions on File Prior to Visit  Medication Sig Dispense Refill  . albuterol (PROVENTIL) (2.5 MG/3ML) 0.083% nebulizer solution Take 3 mLs (2.5 mg total) by nebulization every 6 (six) hours as needed for wheezing or shortness of breath. 75 mL 5  . fluticasone (FLONASE) 50 MCG/ACT nasal spray USE TWO SPRAY(S) IN EACH NOSTRIL ONCE DAILY 16 g 5  . fluticasone-salmeterol (ADVAIR HFA) 115-21 MCG/ACT inhaler Inhale 1 puff into the  lungs 2 (two) times daily. 1 Inhaler 5  . gabapentin (NEURONTIN) 100 MG capsule TAKE ONE CAPSULE BY MOUTH ONCE DAILY IN THE MORNING, ONE CAPSULE AT NOON, AND THREE CAPSULES IN THE EVENING (Patient taking differently: TAKE ONE CAPSULE BY MOUTH ONCE DAILY IN THE MORNING, ONE CAPSULE AT NOON, AND ONE CAPSULES IN THE EVENING) 150 capsule 2  . levalbuterol (XOPENEX HFA) 45 MCG/ACT inhaler INHALE 2 PUFFS EVERY 4-6 HOURS AS NEEDED 1 Inhaler 5  . LORazepam (ATIVAN PO) Take 1 mg by mouth as needed (Takes 0.5mg ).     . losartan (COZAAR) 25 MG tablet TAKE ONE TABLET BY MOUTH TWICE DAILY 60 tablet 5  . metoprolol succinate (TOPROL-XL) 25 MG 24 hr tablet Take 3 tablets (75 mg total) by mouth daily. 270 tablet 1  . Multiple Vitamin (THERA) TABS Take by mouth.    . orphenadrine (NORFLEX) 100 MG tablet Take 1 tablet (100 mg total) by mouth 2 (two) times daily as needed for muscle spasms. 60 tablet 0  . promethazine (PHENERGAN) 12.5 MG tablet Take 1 tablet (12.5 mg total) by mouth every 6 (six) hours as needed for nausea. Reported on 03/04/2015 20 tablet 0  . rosuvastatin (CRESTOR) 20 MG tablet TAKE ONE TABLET BY MOUTH ONCE DAILY IN THE EVENING 30 tablet 3  . temazepam (RESTORIL) 15 MG capsule  Take 30 mg by mouth at bedtime as needed.    . zolpidem (AMBIEN) 10 MG tablet Take 20 mg by mouth at bedtime as needed.     No current facility-administered medications on file prior to visit.     Allergies  Allergen Reactions  . Sulfa Antibiotics Hives  . Metronidazole Nausea And Vomiting  . Acyclovir And Related   . Benadryl [Diphenhydramine Hcl]     Severe emotional reaction and doesn't work well with Pt. Past history  . Cephalosporins Hives  . Codeine Other (See Comments)    Makes patient feel odd ; "sometimes mild; sometimes severe reaction; mostly severe"  . Cyclobenzaprine Other (See Comments)    Muscle Aches.  . Diclofenac Sodium Nausea And Vomiting  . Linzess [Linaclotide] Diarrhea  . Nitrofurantoin Monohyd  Macro     Felt funny  . Prednisone Other (See Comments)    migraine  . Tizanidine     Other reaction(s): Other (See Comments) insomnia  . Baclofen Nausea Only  . Meloxicam     Other reaction(s): Confusion  . Sulfamethoxazole Rash    Family History  Problem Relation Age of Onset  . Heart attack Father 43    Living  . Leukemia Mother 74    Deceased  . Arthritis Father   . Skin cancer Father   . Hypertension Father   . Hyperlipidemia Father   . Alzheimer's disease Paternal Aunt     X2  . Stomach cancer Paternal Aunt     x1  . Arthritis/Rheumatoid Paternal Aunt     x1  . Neuropathy Brother     Peripheal  . Fibromyalgia Sister   . HIV Sister   . Arthritis/Rheumatoid Sister   . Diabetes Paternal Grandmother     Social History   Social History  . Marital status: Legally Separated    Spouse name: N/A  . Number of children: 2  . Years of education: 2 yrs coll   Occupational History  . Disabled    Social History Main Topics  . Smoking status: Former Smoker    Packs/day: 1.00    Years: 20.00    Types: Cigarettes    Quit date: 02/29/2004  . Smokeless tobacco: Never Used  . Alcohol use 0.0 oz/week     Comment: "glass of wine q once in awhile; not very often; do it on special occasion"  . Drug use: No  . Sexual activity: No   Other Topics Concern  . None   Social History Narrative   Completed 2 years of college.    On disability.   Lives at home with her husband.   No caffeine use.   Left-handed.   Three children, 2 biological - 1 adopted.    Review of Systems - See HPI.  All other ROS are negative.  BP 132/78 (BP Location: Left Arm, Patient Position: Sitting, Cuff Size: Large)   Pulse 90   Temp 98.3 F (36.8 C) (Oral)   Resp 16   Ht 5\' 2"  (1.575 m)   Wt 169 lb (76.7 kg)   SpO2 98%   BMI 30.91 kg/m   Physical Exam  Constitutional: She is oriented to person, place, and time and well-developed, well-nourished, and in no distress.  HENT:  Head:  Normocephalic and atraumatic.  Right Ear: External ear normal.  Left Ear: External ear normal.  Eyes: Conjunctivae are normal. Pupils are equal, round, and reactive to light.  Cardiovascular: Normal rate, regular rhythm, normal heart sounds and intact  distal pulses.   Pulmonary/Chest: Effort normal and breath sounds normal. No respiratory distress. She has no wheezes. She has no rales. She exhibits no tenderness.  Neurological: She is alert and oriented to person, place, and time. No cranial nerve deficit.  Skin: Skin is warm and dry. No rash noted.  Psychiatric: Affect normal.  Vitals reviewed.  Assessment/Plan: 1. Migraine with aura and without status migrainosus, not intractable None present. Only one episode since last visit. Lack of adequate food intake is likely culprit. Discussed recommendations with patient. Discussed medications and potential side effects. She feels comfortable with Promethazine but would like to stay off of Imitrex. Discussed appropriate OTC medications for acute migraine. FU as needed.   Leeanne Rio, PA-C

## 2015-12-17 NOTE — Telephone Encounter (Signed)
She has already been referred to Dr. [name]. Seems he only a few months ago set up physical therapy for her so she should be an active patient. I would inform her of this. If she still says she needs referral, ok to place.

## 2015-12-17 NOTE — Telephone Encounter (Signed)
Caller name: Danyeal Relation to pt: self  Call back number: [phone] Pharmacy:  Reason for call: Pt came in office stating is having some vertigo (pt stated that today 12-17-15 visiting physical therapy she felt dizzy)  and is wanting a referral with Dr [name] From [facility] Urologist). Please advise.

## 2015-12-18 NOTE — Telephone Encounter (Signed)
Per [name] okay to place referral for dizziness.

## 2015-12-18 NOTE — Telephone Encounter (Signed)
Patient informed, understood & agreed/SLS 10/20

## 2015-12-21 ENCOUNTER — Telehealth: Payer: Self-pay

## 2015-12-21 ENCOUNTER — Telehealth: Payer: Self-pay | Admitting: Physician Assistant

## 2015-12-21 NOTE — Telephone Encounter (Signed)
Relation to [id number] Call back number:[phone]   Reason for call:  Patient transferred to team health due to patient feeling faint and weak.

## 2015-12-21 NOTE — Telephone Encounter (Signed)
Called patient to see if she was able to go to ED. States she is at [facility] at this time and they have run a few tests. States they are waiting on results.

## 2015-12-21 NOTE — Telephone Encounter (Signed)
Patient Name: Jasmine [name] DOB: Sep 16, 1955 Initial Comment caller states she fainted and now has weakness all over, back pain and headache Nurse Assessment Nurse: Ronnald Ramp, RN, Miranda Date/Time (Eastern Time): 12/21/2015 9:19:26 AM Confirm and document reason for call. If symptomatic, describe symptoms. You must click the next button to save text entered. ---Caller states she passed out last night. She has a headache, neck pain, back pain, and weakness. She is no longer feeling confused. She has a history or hitting her head and told she has restricted blood flow. Has the patient traveled out of the country within the last 30 days? ---Not Applicable Does the patient have any new or worsening symptoms? ---Yes Will a triage be completed? ---Yes Related visit to physician within the last 2 weeks? ---No Does the PT have any chronic conditions? (i.e. diabetes, asthma, etc.) ---Yes List chronic conditions. ---High Cholesterol, HTN, Diagnosed with e-coli 4 days ago Is this a behavioral health or substance abuse call? ---No Guidelines Guideline Title Affirmed Question Affirmed Notes Headache Unable to walk, or can only walk with assistance (e.g., requires support) Final Disposition User Go to ED Now Ronnald Ramp, RN, Miranda Referrals GO TO FACILITY UNDECIDED Disagree/Comply: Comply

## 2015-12-23 ENCOUNTER — Telehealth: Payer: Self-pay | Admitting: Physician Assistant

## 2015-12-23 MED ORDER — ORPHENADRINE CITRATE ER 100 MG PO TB12: 100.0000 mg | ORAL_TABLET | Freq: Two times a day (BID) | ORAL | 0 refills | Status: DC | PRN

## 2015-12-23 MED ORDER — ROSUVASTATIN CALCIUM 20 MG PO TABS: ORAL_TABLET | ORAL | 3 refills | Status: DC

## 2015-12-23 MED ORDER — LOSARTAN POTASSIUM 25 MG PO TABS: 25.0000 mg | ORAL_TABLET | Freq: Two times a day (BID) | ORAL | 3 refills | Status: DC

## 2015-12-23 NOTE — Telephone Encounter (Signed)
: °  Relation to [id number] Call back number:[phone] Pharmacy:[facility] [address]   Reason for call: pt states she is completely out of rx orphenadrine (NORFLEX) 100 MG tablet, also pt is needing rx  rosuvastatin (CRESTOR) 20 MG tablet, and losartan (COZAAR) 25 MG tablet , pt is requesting to switch pharmacy to [facility]. Pt would like a call back confirming rx was sent

## 2015-12-23 NOTE — Telephone Encounter (Signed)
Requested Rx to new pharmacy; patient informed/SLS 10/25

## 2015-12-25 ENCOUNTER — Ambulatory Visit: Payer: 59 | Admitting: Physician Assistant

## 2015-12-28 ENCOUNTER — Encounter: Payer: Self-pay | Admitting: Physician Assistant

## 2015-12-28 ENCOUNTER — Ambulatory Visit (INDEPENDENT_AMBULATORY_CARE_PROVIDER_SITE_OTHER): Payer: 59 | Admitting: Physician Assistant

## 2015-12-28 VITALS — BP 116/72 | HR 62 | Resp 16 | Ht 62.0 in | Wt 168.0 lb

## 2015-12-28 DIAGNOSIS — R232 Flushing: Secondary | ICD-10-CM

## 2015-12-28 DIAGNOSIS — N3000 Acute cystitis without hematuria: Secondary | ICD-10-CM | POA: Diagnosis not present

## 2015-12-28 DIAGNOSIS — R42 Dizziness and giddiness: Secondary | ICD-10-CM

## 2015-12-28 LAB — POCT URINALYSIS DIPSTICK
Bilirubin, UA: NEGATIVE
Blood, UA: NEGATIVE
Glucose, UA: NEGATIVE
KETONES UA: NEGATIVE
Nitrite, UA: NEGATIVE
PROTEIN UA: NEGATIVE
SPEC GRAV UA: 1.02
Urobilinogen, UA: 0.2
pH, UA: 6.5

## 2015-12-28 LAB — TSH: TSH: 1.69 u[IU]/mL (ref 0.35–4.50)

## 2015-12-28 NOTE — Progress Notes (Signed)
Pre visit review using our clinic review tool, if applicable. No additional management support is needed unless otherwise documented below in the visit note/SLS  

## 2015-12-28 NOTE — Patient Instructions (Signed)
I am sending your urine for a culture to ensure resolution of bladder infection.  Please call Dr. [name]' office to schedule a follow-up appointment for chronic migraines. Take phenergan as directed for vertigo and nausea. Ginger supplement may be beneficial. You will be set up with Physical Therapy for vestibular rehabilitation for your positional vertigo.  Start an over-the-counter Black Cohosh supplement for hot flashes.  We are checking some labs today. We will call with your results.  If symptoms recur and are severe, please go to the ER.

## 2015-12-28 NOTE — Progress Notes (Signed)
Patient presents to clinic today for ER follow-up. Patient presented to [facility] ER on 10/23 with c/o positional vertigo. Workup included CT/CTA Head and MRI spine which were all unremarkable to cause of symptoms. Vertigo felt to be 2/2 inner ear issue/postional vertigo.  Patient also recently assessed by Urologist and was noted to have E coli UTI. Has just completed course of Ciprofloxacin. Denies any residual symptoms s/p completion of antibiotic. Denies any current vertigo, nausea or vomiting. Has appointment with Neurology [name]) for further management of chronic migraine and intermittent vertigo.   Past Medical History:  Diagnosis Date  . Anxiety   . Asthma   . Chronic high back pain   . Double vision   . Heart murmur   . High cholesterol   . History of bronchitis    "last time ~ 2009, before I had pneumonia"  . Hyperparathyroidism ([place])   . Hypertension   . Hypoglycemia   . Kidney stone   . Meniscus tear    Right  . Migraine    "used to have them often"  . Migraine 08/07/2015  . Pneumonia ~ 2009  . PTSD (post-traumatic stress disorder)   . Schizophrenia ([place])    treated by Dr. Casimiro Needle  . Seizure ([place]) 07/07/11  . Seizures ([place])    last seizure 3 years ago, not on AED  . Stroke ([place]) 07/08/11   "I've had mini strokes before; left side of face  is more down than right "  . Syncope and collapse    One episode - 07/10/15    Current Outpatient Prescriptions on File Prior to Visit  Medication Sig Dispense Refill  . albuterol (PROVENTIL) (2.5 MG/3ML) 0.083% nebulizer solution Take 3 mLs (2.5 mg total) by nebulization every 6 (six) hours as needed for wheezing or shortness of breath. 75 mL 5  . fluticasone (FLONASE) 50 MCG/ACT nasal spray USE TWO SPRAY(S) IN EACH NOSTRIL ONCE DAILY 16 g 5  . fluticasone-salmeterol (ADVAIR HFA) 115-21 MCG/ACT inhaler Inhale 1 puff into the lungs 2 (two) times daily. 1 Inhaler 5  . gabapentin (NEURONTIN) 100 MG capsule TAKE ONE CAPSULE BY MOUTH ONCE  DAILY IN THE MORNING, ONE CAPSULE AT NOON, AND THREE CAPSULES IN THE EVENING (Patient taking differently: TAKE ONE CAPSULE BY MOUTH ONCE DAILY IN THE MORNING, ONE CAPSULE AT NOON, AND ONE CAPSULES IN THE EVENING) 150 capsule 2  . levalbuterol (XOPENEX HFA) 45 MCG/ACT inhaler INHALE 2 PUFFS EVERY 4-6 HOURS AS NEEDED 1 Inhaler 5  . LORazepam (ATIVAN PO) Take 1 mg by mouth as needed (Takes 0.5mg ).     . losartan (COZAAR) 25 MG tablet Take 1 tablet (25 mg total) by mouth 2 (two) times daily. 60 tablet 3  . metoprolol succinate (TOPROL-XL) 25 MG 24 hr tablet Take 3 tablets (75 mg total) by mouth daily. 270 tablet 1  . Multiple Vitamin (THERA) TABS Take by mouth.    . orphenadrine (NORFLEX) 100 MG tablet Take 1 tablet (100 mg total) by mouth 2 (two) times daily as needed for muscle spasms. 60 tablet 0  . promethazine (PHENERGAN) 12.5 MG tablet Take 1 tablet (12.5 mg total) by mouth every 6 (six) hours as needed for nausea. Reported on 03/04/2015 20 tablet 0  . rosuvastatin (CRESTOR) 20 MG tablet TAKE ONE TABLET BY MOUTH ONCE DAILY IN THE EVENING 30 tablet 3  . temazepam (RESTORIL) 15 MG capsule Take 30 mg by mouth at bedtime as needed.    . zolpidem (AMBIEN) 10 MG  tablet Take 20 mg by mouth at bedtime as needed.     No current facility-administered medications on file prior to visit.     Allergies  Allergen Reactions  . Sulfa Antibiotics Hives  . Metronidazole Nausea And Vomiting  . Acyclovir And Related   . Benadryl [Diphenhydramine Hcl]     Severe emotional reaction and doesn't work well with Pt. Past history  . Cephalosporins Hives  . Codeine Other (See Comments)    Makes patient feel odd ; "sometimes mild; sometimes severe reaction; mostly severe"  . Cyclobenzaprine Other (See Comments)    Muscle Aches.  . Diclofenac Sodium Nausea And Vomiting  . Linzess [Linaclotide] Diarrhea  . Nitrofurantoin Monohyd Macro     Felt funny  . Prednisone Other (See Comments)    migraine  . Soma  [Carisoprodol] Other (See Comments)    Cognitive Function, Daytime sleepiness  . Tizanidine     Other reaction(s): Other (See Comments) insomnia  . Baclofen Nausea Only  . Meloxicam     Other reaction(s): Confusion  . Sulfamethoxazole Rash    Family History  Problem Relation Age of Onset  . Heart attack Father 33    Living  . Leukemia Mother 44    Deceased  . Arthritis Father   . Skin cancer Father   . Hypertension Father   . Hyperlipidemia Father   . Alzheimer's disease Paternal Aunt     X2  . Stomach cancer Paternal Aunt     x1  . Arthritis/Rheumatoid Paternal Aunt     x1  . Neuropathy Brother     Peripheal  . Fibromyalgia Sister   . HIV Sister   . Arthritis/Rheumatoid Sister   . Diabetes Paternal Grandmother     Social History   Social History  . Marital status: Legally Separated    Spouse name: N/A  . Number of children: 2  . Years of education: 2 yrs coll   Occupational History  . Disabled    Social History Main Topics  . Smoking status: Former Smoker    Packs/day: 1.00    Years: 20.00    Types: Cigarettes    Quit date: 02/29/2004  . Smokeless tobacco: Never Used  . Alcohol use 0.0 oz/week     Comment: "glass of wine q once in awhile; not very often; do it on special occasion"  . Drug use: No  . Sexual activity: No   Other Topics Concern  . None   Social History Narrative   Completed 2 years of college.    On disability.   Lives at home with her husband.   No caffeine use.   Left-handed.   Three children, 2 biological - 1 adopted.    Review of Systems - See HPI.  All other ROS are negative.  BP 116/72 (BP Location: Left Arm, Patient Position: Sitting, Cuff Size: Normal)   Pulse 62   Resp 16   Ht 5\' 2"  (1.575 m)   Wt 168 lb (76.2 kg)   SpO2 98%   BMI 30.73 kg/m   Physical Exam  Constitutional: She is oriented to person, place, and time and well-developed, well-nourished, and in no distress.  HENT:  Head: Normocephalic and  atraumatic.  Right Ear: External ear normal.  Left Ear: External ear normal.  TM within normal limits bilaterally.  Eyes: Conjunctivae and EOM are normal. Pupils are equal, round, and reactive to light.  Neck: Neck supple.  Cardiovascular: Normal rate, regular rhythm, normal heart sounds and  intact distal pulses.   Pulmonary/Chest: Effort normal and breath sounds normal. No respiratory distress. She has no wheezes. She has no rales. She exhibits no tenderness.  Neurological: She is alert and oriented to person, place, and time. No cranial nerve deficit.  Skin: Skin is warm and dry. No rash noted.  Psychiatric: Affect normal.  Vitals reviewed.  Assessment/Plan: 1. Acute cystitis without hematuria Completed antibiotic course without residual symptoms. Will check culture to confirm eradication of E.coli. Continue hydration. FU with Urology as scheduled. - CULTURE, URINE COMPREHENSIVE - POCT urinalysis dipstick  2. Vertigo Positional in nature. Negative ER workup including imaging. Resolved mainly at present. Continue supportive measures and medications. Referral to PT for vestibular rehabilitation placed.  3. Hot flashes Will check TSH. Discussed OTC medications to attempt to help with symptoms. FU if not resolving. - TSH   Leeanne Rio, PA-C

## 2015-12-29 LAB — CULTURE, URINE COMPREHENSIVE

## 2016-01-04 ENCOUNTER — Emergency Department (HOSPITAL_BASED_OUTPATIENT_CLINIC_OR_DEPARTMENT_OTHER)
Admission: EM | Admit: 2016-01-04 | Discharge: 2016-01-04 | Disposition: A | Discharge status: Home / Self Care | Payer: 59 | Attending: Emergency Medicine | Admitting: Emergency Medicine

## 2016-01-04 ENCOUNTER — Telehealth: Payer: Self-pay | Admitting: Physician Assistant

## 2016-01-04 ENCOUNTER — Encounter (HOSPITAL_BASED_OUTPATIENT_CLINIC_OR_DEPARTMENT_OTHER): Payer: Self-pay | Admitting: *Deleted

## 2016-01-04 ENCOUNTER — Telehealth: Payer: Self-pay | Admitting: Neurology

## 2016-01-04 DIAGNOSIS — Z79899 Other long term (current) drug therapy: Secondary | ICD-10-CM | POA: Diagnosis not present

## 2016-01-04 DIAGNOSIS — Z7951 Long term (current) use of inhaled steroids: Secondary | ICD-10-CM | POA: Diagnosis not present

## 2016-01-04 DIAGNOSIS — I10 Essential (primary) hypertension: Secondary | ICD-10-CM | POA: Diagnosis not present

## 2016-01-04 DIAGNOSIS — Z8673 Personal history of transient ischemic attack (TIA), and cerebral infarction without residual deficits: Secondary | ICD-10-CM | POA: Diagnosis not present

## 2016-01-04 DIAGNOSIS — Z87891 Personal history of nicotine dependence: Secondary | ICD-10-CM | POA: Diagnosis not present

## 2016-01-04 DIAGNOSIS — J45909 Unspecified asthma, uncomplicated: Secondary | ICD-10-CM | POA: Insufficient documentation

## 2016-01-04 LAB — BASIC METABOLIC PANEL
Anion gap: 8 (ref 5–15)
BUN: 14 mg/dL (ref 6–20)
CHLORIDE: 102 mmol/L (ref 101–111)
CO2: 26 mmol/L (ref 22–32)
Calcium: 9.9 mg/dL (ref 8.9–10.3)
Creatinine, Ser: 0.64 mg/dL (ref 0.44–1.00)
GFR calc Af Amer: >60 mL/min (ref >60)
GFR calc non Af Amer: >60 mL/min (ref >60)
GLUCOSE: 104 mg/dL — AB (ref 65–99)
POTASSIUM: 3.7 mmol/L (ref 3.5–5.1)
Sodium: 136 mmol/L (ref 135–145)

## 2016-01-04 NOTE — Telephone Encounter (Signed)
Changed patient appointment to 30-Minutes per provider request/SLS 11/06  Rudene Anda, RN  01/04/16 11:29 AM  Note    Pt has an appt scheduled with Elyn Aquas, PA-C, tomorrow at 10:30 am.    Called to follow up with patient.  Pt picked up and phone disconnected.  Attempted to call patient back three times, each time phone went to voice mail.  Left a message for call back

## 2016-01-04 NOTE — Telephone Encounter (Signed)
[facility]  Patient Name: Jasmine [name]  DOB: [birth year]    Initial Comment Caller says, today it is 174/94 , but it noramlly 119/60. She has a lot of pain in neck and lower back. Her pain patches are not helping. On Sat she has a migraine and vertigo, with high blood pressure , it was 240/105 ,    Nurse Assessment  Nurse: Wynetta Emery, RN, Baker Janus Date/Time (Eastern Time): 01/04/2016 10:42:38 AM  Confirm and document reason for call. If symptomatic, describe symptoms. You must click the next button to save text entered. ---Caller says, today it is 179/97 , but it normally 119/60. She has a lot of pain in neck and lower back. Her pain patches are not helping. On Saturday she developed migraine and vertigo, and had high blood pressure , it was 240/105 at that time. she now has elevated b/p this am Having migraines for 7 weeks and the worse was this weekend  Has the patient traveled out of the country within the last 30 days? ---No  Does the patient have any new or worsening symptoms? ---Yes  Will a triage be completed? ---Yes  Related visit to physician within the last 2 weeks? ---No  Does the PT have any chronic conditions? (i.e. diabetes, asthma, etc.) ---No  Is this a behavioral health or substance abuse call? ---No     Guidelines    Guideline Title Affirmed Question Affirmed Notes  Headache [1] SEVERE headache (e.g., excruciating) AND [2] not improved after 2 hours of pain medicine    Final Disposition User   See Physician within 4 Hours (or PCP triage) Wynetta Emery, RNBaker Janus    Comments  [id number] am Wm. Elyn Aquas C/0 migraines that led to high blood pressure   Referrals  GO TO FACILITY UNDECIDED  REFERRED TO PCP OFFICE   Disagree/Comply: Comply

## 2016-01-04 NOTE — Discharge Instructions (Signed)

## 2016-01-04 NOTE — Telephone Encounter (Signed)
Patient called stating that her BP was high, it was 240-105 over the weekend. (she did not tell me the BP currently) She also stated that she was having migraines. Patient sent to [facility]. FYI

## 2016-01-04 NOTE — Telephone Encounter (Signed)
Called to offer pt w/ Hoyle Sauer NP tomorrow as Dr. [name] does not have available appt this week but no answer. Left VM to call back to schedule w/ nurse practitioner.

## 2016-01-04 NOTE — ED Notes (Signed)
ED Provider at bedside. 

## 2016-01-04 NOTE — ED Notes (Addendum)
Pt states she is here because her B/P has been high. Pt states that the vertigo and back pain is chronic and that she doesn't need treatment for those. Pt only wanting treatment for her B/P. Pt denies CP, ShOB. Pt states she had some disorientation over the weekend.

## 2016-01-04 NOTE — ED Provider Notes (Signed)
Emergency Department Provider Note  By signing my name below, I, Neta Mends, attest that this documentation has been prepared under the direction and in the presence of Margette Fast, MD . Electronically Signed: Neta Mends, ED Scribe. 01/04/2016. 4:04 PM.   I have reviewed the triage vital signs and the nursing notes.   HISTORY  Chief Complaint Hypertension   HPI Jasmine [name] is a 60 y.o. female with PMHx of migraines and HTN who presents to the Emergency Department complaining of a recurrent headache and vertigo x 7 weeks. Pt complains of associated dizziness, back pain and neck pain. Pt states that her migraine symptoms typically lead to neck pain. Pt also reports a concern with her current HTN, and states that several days ago she measured her BP at 204/105.  Pt reports that she went to [facility] recently but that she was too disoriented to provide a thorough history. Pt has taken Advil with no relief. Pt denies chest pain, SOB. She reports that her vertigo, neck pain, back pain, and HA are chronic and is only interested in controlling her BP. She has a PCP appointment tomorrow morning but came in today after being told to do so after calling the nurse line.   Past Medical History:  Diagnosis Date  . Anxiety   . Asthma   . Chronic high back pain   . Double vision   . Heart murmur   . High cholesterol   . History of bronchitis    "last time ~ 2009, before I had pneumonia"  . Hyperparathyroidism ([place])   . Hypertension   . Hypoglycemia   . Kidney stone   . Meniscus tear    Right  . Migraine    "used to have them often"  . Migraine 08/07/2015  . Pneumonia ~ 2009  . PTSD (post-traumatic stress disorder)   . Schizophrenia ([place])    treated by Dr. Casimiro Needle  . Seizure ([employer]) 07/07/11  . Seizures ([place])    last seizure 3 years ago, not on AED  . Stroke ([place]) 07/08/11   "I've had mini strokes before; left side of face  is more down than right "  . Syncope and  collapse    One episode - 07/10/15    Patient Active Problem List   Diagnosis Date Noted  . Tennis elbow syndrome 11/09/2015  . Fibromyalgia 10/31/2015  . Migraine 08/07/2015  . Neck pain 08/07/2015  . Bilateral leg pain 04/26/2015  . Dysphagia 03/29/2015  . Myalgia and myositis 03/04/2015  . DJD (degenerative joint disease) of knee 12/09/2014    Past Surgical History:  Procedure Laterality Date  . COCHLEAR IMPLANT  2010   left  . [place]   left  . KIDNEY STONE SURGERY  ~ 2006  . TONSILLECTOMY     "as a child"  . WISDOM TOOTH EXTRACTION      Current Outpatient Rx  . Order #: [id number] Class: Normal  . Order #: [id number] Class: Historical Med  . Order #: [id number] Class: Normal  . Order #: [id number] Class: Normal  . Order #: [id number] Class: Normal  . Order #: [id number] Class: No Print  . Order #: [id number] Class: Historical Med  . Order #: [id number] Class: Normal  . Order #: [id number] Class: Normal  . Order #: [id number] Class: Historical Med  . Order #: [id number] Class: Historical Med  . Order #: [id number] Class: Normal  . Order #: [id number] Class: Normal  . Order #: [id number] Class: Normal  . Order #: [id number] Class:  Historical Med  . Order #: [id number] Class: Historical Med    Allergies Sulfa antibiotics; Metronidazole; Acyclovir and related; Benadryl [diphenhydramine hcl]; Cephalosporins; Codeine; Cyclobenzaprine; Diclofenac sodium; Linzess [linaclotide]; Nitrofurantoin monohyd macro; Prednisone; Soma [carisoprodol]; Tizanidine; Baclofen; Meloxicam; and Sulfamethoxazole  Family History  Problem Relation Age of Onset  . Heart attack Father 66    Living  . Arthritis Father   . Skin cancer Father   . Hypertension Father   . Hyperlipidemia Father   . Leukemia Mother 64    Deceased  . Alzheimer's disease Paternal Aunt     X2  . Stomach cancer Paternal Aunt     x1  . Arthritis/Rheumatoid Paternal Aunt     x1  . Neuropathy Brother     Peripheal  .  Fibromyalgia Sister   . HIV Sister   . Arthritis/Rheumatoid Sister   . Diabetes Paternal Grandmother     Social History Social History  Substance Use Topics  . Smoking status: Former Smoker    Packs/day: 1.00    Years: 20.00    Types: Cigarettes    Quit date: 02/29/2004  . Smokeless tobacco: Never Used  . Alcohol use 0.0 oz/week     Comment: "glass of wine q once in awhile; not very often; do it on special occasion"    Review of Systems  Constitutional: No fever/chills Eyes: No visual changes. ENT: No sore throat. Cardiovascular: Denies chest pain. Respiratory: Denies shortness of breath. Gastrointestinal: No abdominal pain.  No nausea, no vomiting.  No diarrhea.  No constipation. Genitourinary: Negative for dysuria. Musculoskeletal: Positive for back pain and neck pain. Skin: Negative for rash. Neurological: Positive for headaches and dizziness (vertigo). Negative for focal weakness or numbness.  10-point ROS otherwise negative.  ____________________________________________   PHYSICAL EXAM:  VITAL SIGNS: ED Triage Vitals  Enc Vitals Group     BP 01/04/16 1519 186/89     Pulse Rate 01/04/16 1519 80     Resp 01/04/16 1519 18     Temp 01/04/16 1519 97.5 F (36.4 C)     Temp Source 01/04/16 1519 Oral     SpO2 01/04/16 1519 97 %     Weight 01/04/16 1518 168 lb (76.2 kg)     Height 01/04/16 1518 5' 2.5" (1.588 m)     Pain Score 01/04/16 1518 7   Constitutional: Alert and oriented. Well appearing and in no acute distress. Eyes: Conjunctivae are normal. PERRL. EOMI. Head: Atraumatic. Nose: No congestion/rhinnorhea. Mouth/Throat: Mucous membranes are moist.  Oropharynx non-erythematous. Neck: No stridor.  No meningeal signs. Cardiovascular: Normal rate, regular rhythm. Good peripheral circulation. Grossly normal heart sounds.   Respiratory: Normal respiratory effort.  No retractions. Lungs CTAB. Gastrointestinal: Soft and nontender. No distention.  Musculoskeletal:  No lower extremity tenderness nor edema. No gross deformities of extremities. Neurologic:  Normal speech and language. No gross focal neurologic deficits are appreciated. Normal gait. No pronator drift.  Skin:  Skin is warm, dry and intact. No rash noted. Psychiatric: Mood and affect are normal. Speech and behavior are normal.  ____________________________________________   LABS (all labs ordered are listed, but only abnormal results are displayed)  Labs Reviewed  BASIC METABOLIC PANEL - Abnormal; Notable for the following:       Result Value   Glucose, Bld 104 (*)    All other components within normal limits   ____________________________________________  EKG   EKG Interpretation  Date/Time:  Monday January 04 2016 16:40:00 EST Ventricular Rate:  61 PR Interval:  QRS Duration: 94 QT Interval:  416 QTC Calculation: 419 R Axis:   39 Text Interpretation:  Sinus rhythm Atrial premature complex No STEMI.  Confirmed by Pinchas Reither MD, Jovi Alvizo [id number]) on 01/04/2016 4:45:21 PM       ____________________________________________  RADIOLOGY  None ____________________________________________   PROCEDURES  Procedure(s) performed:   Procedures  None ____________________________________________   INITIAL IMPRESSION / ASSESSMENT AND PLAN / ED COURSE  Pertinent labs & imaging results that were available during my care of the patient were reviewed by me and considered in my medical decision making (see chart for details).  Patient resents to the emergency department for evaluation of elevated blood pressure. She has chronic neck and back pain along with chronic vertigo. She states that she is being evaluated by a neurologist for her vertigo and a chiropractor for back discomfort. She denies any worsening of these symptoms recently. She has noticed elevated blood pressures over the last several days and is here exclusively for this reason. Her elevated blood pressures managed by her  primary care physician. She is currently taking Losartan and Metoprolol. Plan for chemistry and EKG to assess for end organ damage related to elevated BP. Her neurological exam is at the patient's baseline. Does not seem confused. No clear indication for further neuro imaging or testing or acute BP control at this time.  05:08 PM Patient continues to feel well. Labs and EKG are unremarkable. She is scheduled to see her primary care physician tomorrow and will discuss further blood pressure management and visit. Will not make changes to her blood pressure medication at this time.  At this time, I do not feel there is any life-threatening condition present. I have reviewed and discussed all results (EKG, imaging, lab, urine as appropriate), exam findings with patient. I have reviewed nursing notes and appropriate previous records.  I feel the patient is safe to be discharged home without further emergent workup. Discussed usual and customary return precautions. Patient and family (if present) verbalize understanding and are comfortable with this plan.  Patient will follow-up with their primary care provider. If they do not have a primary care provider, information for follow-up has been provided to them. All questions have been answered.  ____________________________________________  FINAL CLINICAL IMPRESSION(S) / ED DIAGNOSES  Final diagnoses:  Hypertension, unspecified type     MEDICATIONS GIVEN DURING THIS VISIT:  None  NEW OUTPATIENT MEDICATIONS STARTED DURING THIS VISIT:  None   Note:  This document was prepared using Dragon voice recognition software and may include unintentional dictation errors.  Nanda Quinton, MD Emergency Medicine  I personally performed the services described in this documentation, which was scribed in my presence. The recorded information has been reviewed and is accurate.       Margette Fast, MD 01/05/16 [phone]

## 2016-01-04 NOTE — ED Notes (Signed)
Pt refusing EKG while talking to Dr. Laverta Baltimore.

## 2016-01-04 NOTE — Telephone Encounter (Signed)
Pt called back.  Apologized for phone disconnecting.  She said currently she feels much better than she felt this weekend, however Migraines and back pain persist.   On Saturday she was in severe pain (see note below)---migraine with vertigo.  Blood pressure elevated at 240/105.  States pain was so intense she couldn't speak, coordinate her movements.   BPs remain elevated.  Earlier BP was 179/94.  Currently BP is 168/96.  No acute distress noted over the phone.  Speech coherent.  No excessive weakness.  She was advised to keep appt as schedule and if symptoms worsen or become as severe as they were this weekend to call EMS.  She stated understanding and agreed with plan. She said she would discuss the same with her husband.

## 2016-01-04 NOTE — Telephone Encounter (Signed)
Patient reported that her afternoon systolic blood pressure readings are increasing: 184, 186 & 189/90's. She denies dizziness, increased or pounding heart rate & excessive sweating. However, she continues to have on-going migraines, along with back/neck pain (rates 8/10) and tingling in her hands (no numbness). Patient voiced that she's very concern about the constant rise in her blood pressure. An appointment was scheduled earlier today for the patient to be seen with Elyn Aquas, PA-C on 01/05/16 at 10:30 AM. Advised patient to seek the nearest ED or Urgent Care due to worsening symptoms. Patient stated that "she would possibly consider going to urgent care, but needed to first speak with her husband. Informed patient to call the office if she has any further concerns. She verbalized understanding.

## 2016-01-04 NOTE — Telephone Encounter (Signed)
Appt scheduled w/ Hoyle Sauer NP tomorrow at 2:15. Pt agreed to arrive 15 min prior to appt time.

## 2016-01-04 NOTE — ED Triage Notes (Addendum)
Migraine headache and vertigo per pt. Her husband states she was seen at [facility]. Pt told her husband she does not want [place] to know about that to be quiet.

## 2016-01-04 NOTE — Telephone Encounter (Signed)
Pt has an appt scheduled with Elyn Aquas, PA-C, tomorrow at 10:30 am.    Called to follow up with patient.  Pt picked up and phone disconnected.  Attempted to call patient back three times, each time phone went to voice mail.  Left a message for call back.

## 2016-01-04 NOTE — Telephone Encounter (Signed)
Kathrynn Ducking, MD  Neurology   Appointment   Reason for call   Conversation: Appointment  (Newest Message First)  Monte Fantasia, RN  to Jasmine [name]    01/04/16 9:57 AM  Dr. [name] doesn't have any availability any time soon. Would she be willing to see a nurse practitioner? Hoyle Sauer has a few openings later today (10:45, 11:15, 3:45).  Jasmine [name]  to Monte Fantasia, RN    01/04/16 9:35 AM  Pt was tranfered to my phone, pt wants apt ASAP, pt stated that she was not feeling well this weekend with her BP and headache along with vertigo and was lifeless for over a couple of hours and needs to be seen asap. Please call pt.

## 2016-01-05 ENCOUNTER — Encounter: Payer: Self-pay | Admitting: Nurse Practitioner

## 2016-01-05 ENCOUNTER — Encounter: Payer: Self-pay | Admitting: Physician Assistant

## 2016-01-05 ENCOUNTER — Ambulatory Visit (INDEPENDENT_AMBULATORY_CARE_PROVIDER_SITE_OTHER): Payer: 59 | Admitting: Nurse Practitioner

## 2016-01-05 ENCOUNTER — Telehealth: Payer: Self-pay | Admitting: Neurology

## 2016-01-05 ENCOUNTER — Ambulatory Visit (INDEPENDENT_AMBULATORY_CARE_PROVIDER_SITE_OTHER): Payer: 59 | Admitting: Physician Assistant

## 2016-01-05 ENCOUNTER — Other Ambulatory Visit: Payer: Self-pay | Admitting: *Deleted

## 2016-01-05 VITALS — BP 122/78 | HR 84 | Temp 98.7°F | Resp 16 | Ht 62.5 in | Wt 162.2 lb

## 2016-01-05 VITALS — BP 156/84 | HR 68 | Ht 62.5 in | Wt 164.6 lb

## 2016-01-05 DIAGNOSIS — H8110 Benign paroxysmal vertigo, unspecified ear: Secondary | ICD-10-CM

## 2016-01-05 DIAGNOSIS — I10 Essential (primary) hypertension: Secondary | ICD-10-CM

## 2016-01-05 DIAGNOSIS — G43109 Migraine with aura, not intractable, without status migrainosus: Secondary | ICD-10-CM

## 2016-01-05 DIAGNOSIS — M797 Fibromyalgia: Secondary | ICD-10-CM | POA: Diagnosis not present

## 2016-01-05 HISTORY — DX: Benign paroxysmal vertigo, unspecified ear: H81.10

## 2016-01-05 MED ORDER — MECLIZINE HCL 25 MG PO TABS: 25.0000 mg | ORAL_TABLET | Freq: Two times a day (BID) | ORAL | 2 refills | Status: DC | PRN

## 2016-01-05 MED ORDER — MECLIZINE HCL 25 MG PO TABS
25.0000 mg | ORAL_TABLET | Freq: Two times a day (BID) | ORAL | 2 refills | Status: DC | PRN
Start: 2016-01-05 — End: 2016-01-05

## 2016-01-05 MED ORDER — CLONIDINE HCL 0.1 MG PO TABS: ORAL_TABLET | ORAL | 0 refills | Status: DC

## 2016-01-05 NOTE — Telephone Encounter (Signed)
Called [facility] and cancelled meclizine, wanted sent to [facility].  Done.

## 2016-01-05 NOTE — Progress Notes (Signed)
Patient presents to clinic today c/o for ER follow-up. Patient presented to ER yesterday (01/04/16) after noting severe migraine with significant elevation in BP to 190s/100s. ER assessment including negative labs and EKG. Patient's BP improved with resolution of headache. Was sent home on previous regimen and encouraged to follow-up with PCP and Neurology for further assessment. Patient denies recurrence of headache since visit. Has been hydrating and eating well. Endorses she has not checked BP since yesterday but is taking her Toprol and Losartan as directed. Is scheduled to see Dr. [name] (Neurology) today at 2:15 PM.  BP Readings from Last 3 Encounters:  01/05/16 (!) 156/84  01/05/16 122/78  01/04/16 164/88   Past Medical History:  Diagnosis Date  . Anxiety   . Asthma   . Chronic high back pain   . Double vision   . Fibromyalgia   . Heart murmur   . High cholesterol   . History of bronchitis    "last time ~ 2009, before I had pneumonia"  . Hyperparathyroidism ([place])   . Hypertension   . Hypoglycemia   . Kidney stone   . Knee pain, right   . Meniscus tear    Right  . Migraine    "used to have them often"  . Migraine 08/07/2015  . Pneumonia ~ 2009  . PTSD (post-traumatic stress disorder)   . Schizophrenia ([place])    treated by Dr. Casimiro Needle  . Seizure ([place]) 07/07/11  . Seizures ([place])    last seizure 3 years ago, not on AED  . Stroke ([place]) 07/08/11   "I've had mini strokes before; left side of face  is more down than right "  . Syncope and collapse    One episode - 07/10/15    Current Outpatient Prescriptions on File Prior to Visit  Medication Sig Dispense Refill  . albuterol (PROVENTIL) (2.5 MG/3ML) 0.083% nebulizer solution Take 3 mLs (2.5 mg total) by nebulization every 6 (six) hours as needed for wheezing or shortness of breath. 75 mL 5  . fluticasone (FLONASE) 50 MCG/ACT nasal spray USE TWO SPRAY(S) IN EACH NOSTRIL ONCE DAILY 16 g 5  . fluticasone-salmeterol (ADVAIR HFA)  115-21 MCG/ACT inhaler Inhale 1 puff into the lungs 2 (two) times daily. 1 Inhaler 5  . gabapentin (NEURONTIN) 100 MG capsule TAKE ONE CAPSULE BY MOUTH ONCE DAILY IN THE MORNING, ONE CAPSULE AT NOON, AND THREE CAPSULES IN THE EVENING (Patient taking differently: TAKE ONE CAPSULE BY MOUTH ONCE DAILY IN THE MORNING, ONE CAPSULE AT NOON, AND ONE CAPSULES IN THE EVENING) 150 capsule 2  . levalbuterol (XOPENEX HFA) 45 MCG/ACT inhaler INHALE 2 PUFFS EVERY 4-6 HOURS AS NEEDED 1 Inhaler 5  . LORazepam (ATIVAN PO) Take 1 mg by mouth as needed (Takes 0.78m).     .Marland Kitchenlosartan (COZAAR) 25 MG tablet Take 1 tablet (25 mg total) by mouth 2 (two) times daily. 60 tablet 3  . metoprolol succinate (TOPROL-XL) 25 MG 24 hr tablet Take 3 tablets (75 mg total) by mouth daily. 270 tablet 1  . Multiple Vitamin (THERA) TABS Take by mouth.    . orphenadrine (NORFLEX) 100 MG tablet Take 1 tablet (100 mg total) by mouth 2 (two) times daily as needed for muscle spasms. 60 tablet 0  . promethazine (PHENERGAN) 12.5 MG tablet Take 1 tablet (12.5 mg total) by mouth every 6 (six) hours as needed for nausea. Reported on 03/04/2015 20 tablet 0  . rosuvastatin (CRESTOR) 20 MG tablet TAKE ONE TABLET BY MOUTH  ONCE DAILY IN THE EVENING 30 tablet 3  . temazepam (RESTORIL) 15 MG capsule Take 30 mg by mouth at bedtime as needed.    . zolpidem (AMBIEN) 10 MG tablet Take 20 mg by mouth at bedtime as needed.    . docusate sodium (COLACE) 100 MG capsule Take by mouth 2 (two) times daily.    . Nutritional Supplements (COMPLETE PROTEIN/VITAMIN SHAKE PO) Take by mouth daily.     No current facility-administered medications on file prior to visit.     Allergies  Allergen Reactions  . Sulfa Antibiotics Hives  . Metronidazole Nausea And Vomiting  . Acyclovir And Related   . Benadryl [Diphenhydramine Hcl]     Severe emotional reaction and doesn't work well with Pt. Past history  . Cephalosporins Hives  . Codeine Other (See Comments)    Makes  patient feel odd ; "sometimes mild; sometimes severe reaction; mostly severe"  . Cyclobenzaprine Other (See Comments)    Muscle Aches.  . Diclofenac Sodium Nausea And Vomiting  . Linzess [Linaclotide] Diarrhea  . Nitrofurantoin Monohyd Macro     Felt funny  . Prednisone Other (See Comments)    migraine  . Soma [Carisoprodol] Other (See Comments)    Cognitive Function, Daytime sleepiness  . Tizanidine     Other reaction(s): Other (See Comments) insomnia  . Baclofen Nausea Only  . Meloxicam     Other reaction(s): Confusion  . Sulfamethoxazole Rash    Family History  Problem Relation Age of Onset  . Heart attack Father 1    Living  . Arthritis Father   . Skin cancer Father   . Hypertension Father   . Hyperlipidemia Father   . Leukemia Mother 36    Deceased  . Alzheimer's disease Paternal Aunt     X2  . Stomach cancer Paternal Aunt     x1  . Arthritis/Rheumatoid Paternal Aunt     x1  . Neuropathy Brother     Peripheal  . Fibromyalgia Sister   . HIV Sister   . Arthritis/Rheumatoid Sister   . Diabetes Paternal Grandmother     Social History   Social History  . Marital status: Legally Separated    Spouse name: N/A  . Number of children: 2  . Years of education: 2 yrs coll   Occupational History  . Disabled    Social History Main Topics  . Smoking status: Former Smoker    Packs/day: 1.00    Years: 20.00    Types: Cigarettes    Quit date: 02/29/2004  . Smokeless tobacco: Never Used  . Alcohol use 0.0 oz/week     Comment: "glass of wine q once in awhile; not very often; do it on special occasion"  . Drug use: No  . Sexual activity: No   Other Topics Concern  . None   Social History Narrative   Completed 2 years of college.    On disability.   Lives at home with her husband.   No caffeine use.   Left-handed.   Three children, 2 biological - 1 adopted.    Review of Systems - See HPI.  All other ROS are negative.  BP 122/78 (BP Location: Left Arm,  Patient Position: Sitting, Cuff Size: Large)   Pulse 84   Temp 98.7 F (37.1 C) (Oral)   Resp 16   Ht 5' 2.5" (1.588 m)   Wt 162 lb 4 oz (73.6 kg)   SpO2 99%   BMI 29.20 kg/m  Physical Exam  Constitutional: She is oriented to person, place, and time and well-developed, well-nourished, and in no distress.  HENT:  Head: Normocephalic and atraumatic.  Right Ear: External ear normal.  Left Ear: External ear normal.  Eyes: Conjunctivae are normal. Pupils are equal, round, and reactive to light.  Neck: Neck supple.  Cardiovascular: Normal rate, regular rhythm, normal heart sounds and intact distal pulses.   Pulmonary/Chest: Effort normal.  Neurological: She is alert and oriented to person, place, and time.  Skin: Skin is warm and dry. No rash noted.  Psychiatric: Affect normal.  Vitals reviewed.   Recent Results (from the past 2160 hour(s))  CULTURE, URINE COMPREHENSIVE     Status: None   Collection Time: 12/28/15 12:32 PM  Result Value Ref Range   Colony Count 50,000-100,000 CFU/mL    Organism ID, Bacteria CORYNEBACTERIUM SPECIES   TSH     Status: None   Collection Time: 12/28/15 12:36 PM  Result Value Ref Range   TSH 1.69 0.35 - 4.50 uIU/mL  POCT urinalysis dipstick     Status: Abnormal   Collection Time: 12/28/15 12:45 PM  Result Value Ref Range   Color, UA straw    Clarity, UA clear    Glucose, UA neg    Bilirubin, UA neg    Ketones, UA neg    Spec Grav, UA 1.020    Blood, UA negative    pH, UA 6.5    Protein, UA negative    Urobilinogen, UA 0.2    Nitrite, UA negative    Leukocytes, UA Trace (A) Negative  Basic metabolic panel     Status: Abnormal   Collection Time: 01/04/16  4:30 PM  Result Value Ref Range   Sodium 136 135 - 145 mmol/L   Potassium 3.7 3.5 - 5.1 mmol/L   Chloride 102 101 - 111 mmol/L   CO2 26 22 - 32 mmol/L   Glucose, Bld 104 (H) 65 - 99 mg/dL   BUN 14 6 - 20 mg/dL   Creatinine, Ser 0.64 0.44 - 1.00 mg/dL   Calcium 9.9 8.9 - 10.3 mg/dL    GFR calc non Af Amer >60 >60 mL/min   GFR calc Af Amer >60 >60 mL/min    Comment: (NOTE) The eGFR has been calculated using the CKD EPI equation. This calculation has not been validated in all clinical situations. eGFR's persistently <60 mL/min signify possible Chronic Kidney Disease.    Anion gap 8 5 - 15    Assessment/Plan: Essential hypertension BP normotensive today on current regimen. Labile measurements secondary to acute migraines. Asymptomatic at present. Neurology assessment today for chronic headaches. Will give Rx clonidine 0.1 mg to take as directed with acute BP increased due to headaches (parameters given.) If taken, she is to check BP 20-30 minutes after taking. If not improving, she is to go to ER. FU scheduled.    Jasmine Rio, PA-C

## 2016-01-05 NOTE — Patient Instructions (Addendum)
Follow-up with vestibular rehabilitation Continue keep a record of blood pressure daily  Migraine tracker APP  Follow up prn

## 2016-01-05 NOTE — Assessment & Plan Note (Signed)
BP normotensive today on current regimen. Labile measurements secondary to acute migraines. Asymptomatic at present. Neurology assessment today for chronic headaches. Will give Rx clonidine 0.1 mg to take as directed with acute BP increased due to headaches (parameters given.) If taken, she is to check BP 20-30 minutes after taking. If not improving, she is to go to ER. FU scheduled.

## 2016-01-05 NOTE — Progress Notes (Signed)
Pre visit review using our clinic review tool, if applicable. No additional management support is needed unless otherwise documented below in the visit note/SLS  

## 2016-01-05 NOTE — Telephone Encounter (Signed)
Patient called regarding visit today with NP, Hoyle Sauer, states "Cecille Rubin would not even look at me, she would not give me eye contact", "that's not professional", "she's not like Dr. [name], she was looking at my husband almost the whole time", "that was disrespectful", "she would not even listen to what I said, she completely ignored me when I would talk to her", "I wish I had never scheduled an appointment with her".

## 2016-01-05 NOTE — Progress Notes (Signed)
[facility]  PATIENT: Jasmine [name] DOB: [birth year]   REASON FOR VISIT: Follow-up for headache /migraine HISTORY FROM: Patient and husband    HISTORY OF PRESENT ILLNESS:UPDATE 11/07/2017[name] , 60 year old female returns for follow-up. She called in yesterday to be seen for migraine however there were no available appointments. Instead she went to [facility] in [facility]. She has had recurrent headache and vertigo for 7 weeks and occasional dizziness back and neck pain. She also has a history of hypertension and her medications are currently being regulated. She also saw her PCP who ordered vestibular rehab. She is on Toprol 25 mg 3 tablets daily. Blood pressure the office today 156/84. She is not having headache today. She reports that her pain level yesterday was 7.5 and today is 0 She is requesting meclizine for her dizziness. She denies any focal weakness or numbness She has a history of cochlear implant. She returns for reevaluation   HISTORY 08/07/15 [name] is a 60 year old left-handed white female with a history of fibromyalgia and a prior history of migraine earlier in her life. The patient has not had any headaches for about 15 years, she went to the emergency room at [facility] on 07/10/2015 with onset of a headache associated with loss of consciousness. The patient was laying in bed and she had a relatively rapid onset of headache on the top of her head. She got up to go the bathroom, she felt woozy, and she briefly lost consciousness. The patient was caught by her husband, she did not fall and hit the floor. The patient was unconscious only for a few seconds. The patient had a severe headache associated with some nausea, she went to the emergency room for an evaluation. MRI could not be done as she has a cochlear implant. A CT angiogram did not show evidence of an aneurysm. There was a mild enlargement of a portion of the pericallosal artery. The patient has  not had any headache problems since that time, but she indicates that her neck and shoulders are somewhat stiff. She is having some nausea off and on, but she was having significant abdominal complaints for about 5 weeks prior to the emergency room visit where she was not eating well and she had significant nausea. She has been taking Phenergan. Since the emergency room visit, her appetite has returned, but she still has some bouts of nausea. The patient reports some blurring of vision. She was placed on Flexeril, but she could not tolerate the medication. She has an allergy to tizanidine. She reports some neck and low back pain associated with her fibromyalgia, but her current discomfort is more than usual. She denies any weakness or numbness of the extremities, she denies any balance issues or difficulty controlling the bowels or the bladder. She is sent to this office for an evaluation.  REVIEW OF SYSTEMS: Full 14 system review of systems performed and notable only for those listed, all others are neg:  Constitutional: neg  Cardiovascular: neg Ear/Nose/Throat: Cochlear implant, hearing loss on the right  Skin: neg Eyes: Light sensitivity Respiratory: neg Gastroitestinal: neg  Hematology/Lymphatic: neg  Endocrine: Intolerance to heat and cold Musculoskeletal:neg Allergy/Immunology: neg Neurological: History of headaches migraine/dizziness Psychiatric: neg Sleep : neg   ALLERGIES: Allergies  Allergen Reactions  . Sulfa Antibiotics Hives  . Metronidazole Nausea And Vomiting  . Acyclovir And Related   . Benadryl [Diphenhydramine Hcl]     Severe emotional reaction and doesn't work well  with Pt. Past history  . Cephalosporins Hives  . Codeine Other (See Comments)    Makes patient feel odd ; "sometimes mild; sometimes severe reaction; mostly severe"  . Cyclobenzaprine Other (See Comments)    Muscle Aches.  . Diclofenac Sodium Nausea And Vomiting  . Linzess [Linaclotide] Diarrhea  .  Nitrofurantoin Monohyd Macro     Felt funny  . Prednisone Other (See Comments)    migraine  . Soma [Carisoprodol] Other (See Comments)    Cognitive Function, Daytime sleepiness  . Tizanidine     Other reaction(s): Other (See Comments) insomnia  . Baclofen Nausea Only  . Meloxicam     Other reaction(s): Confusion  . Sulfamethoxazole Rash    HOME MEDICATIONS: Outpatient Medications Prior to Visit  Medication Sig Dispense Refill  . albuterol (PROVENTIL) (2.5 MG/3ML) 0.083% nebulizer solution Take 3 mLs (2.5 mg total) by nebulization every 6 (six) hours as needed for wheezing or shortness of breath. 75 mL 5  . cloNIDine (CATAPRES) 0.1 MG tablet Take 1 tablet by mouth if BP rises above 160/100 with migraine 30 tablet 0  . docusate sodium (COLACE) 100 MG capsule Take by mouth 2 (two) times daily.    . fluticasone (FLONASE) 50 MCG/ACT nasal spray USE TWO SPRAY(S) IN EACH NOSTRIL ONCE DAILY 16 g 5  . fluticasone-salmeterol (ADVAIR HFA) 115-21 MCG/ACT inhaler Inhale 1 puff into the lungs 2 (two) times daily. 1 Inhaler 5  . gabapentin (NEURONTIN) 100 MG capsule TAKE ONE CAPSULE BY MOUTH ONCE DAILY IN THE MORNING, ONE CAPSULE AT NOON, AND THREE CAPSULES IN THE EVENING (Patient taking differently: TAKE ONE CAPSULE BY MOUTH ONCE DAILY IN THE MORNING, ONE CAPSULE AT NOON, AND ONE CAPSULES IN THE EVENING) 150 capsule 2  . levalbuterol (XOPENEX HFA) 45 MCG/ACT inhaler INHALE 2 PUFFS EVERY 4-6 HOURS AS NEEDED 1 Inhaler 5  . LORazepam (ATIVAN PO) Take 1 mg by mouth as needed (Takes 0.5mg ).     . losartan (COZAAR) 25 MG tablet Take 1 tablet (25 mg total) by mouth 2 (two) times daily. 60 tablet 3  . metoprolol succinate (TOPROL-XL) 25 MG 24 hr tablet Take 3 tablets (75 mg total) by mouth daily. 270 tablet 1  . Multiple Vitamin (THERA) TABS Take by mouth.    . Nutritional Supplements (COMPLETE PROTEIN/VITAMIN SHAKE PO) Take by mouth daily.    . orphenadrine (NORFLEX) 100 MG tablet Take 1 tablet (100 mg  total) by mouth 2 (two) times daily as needed for muscle spasms. 60 tablet 0  . promethazine (PHENERGAN) 12.5 MG tablet Take 1 tablet (12.5 mg total) by mouth every 6 (six) hours as needed for nausea. Reported on 03/04/2015 20 tablet 0  . rosuvastatin (CRESTOR) 20 MG tablet TAKE ONE TABLET BY MOUTH ONCE DAILY IN THE EVENING 30 tablet 3  . temazepam (RESTORIL) 15 MG capsule Take 30 mg by mouth at bedtime as needed.    . zolpidem (AMBIEN) 10 MG tablet Take 20 mg by mouth at bedtime as needed.     No facility-administered medications prior to visit.     PAST MEDICAL HISTORY: Past Medical History:  Diagnosis Date  . Anxiety   . Asthma   . Chronic high back pain   . Double vision   . Fibromyalgia   . Heart murmur   . High cholesterol   . History of bronchitis    "last time ~ 2009, before I had pneumonia"  . Hyperparathyroidism ([place])   . Hypertension   . Hypoglycemia   .  Kidney stone   . Knee pain, right   . Meniscus tear    Right  . Migraine    "used to have them often"  . Migraine 08/07/2015  . Pneumonia ~ 2009  . PTSD (post-traumatic stress disorder)   . Schizophrenia ([place])    treated by Dr. Casimiro Needle  . Seizure ([place]) 07/07/11  . Seizures ([place])    last seizure 3 years ago, not on AED  . Stroke ([place]) 07/08/11   "I've had mini strokes before; left side of face  is more down than right "  . Syncope and collapse    One episode - 07/10/15    PAST SURGICAL HISTORY: Past Surgical History:  Procedure Laterality Date  . COCHLEAR IMPLANT  2010   left  . [place]   left  . KIDNEY STONE SURGERY  ~ 2006  . TONSILLECTOMY     "as a child"  . WISDOM TOOTH EXTRACTION      FAMILY HISTORY: Family History  Problem Relation Age of Onset  . Heart attack Father 22    Living  . Arthritis Father   . Skin cancer Father   . Hypertension Father   . Hyperlipidemia Father   . Leukemia Mother 83    Deceased  . Alzheimer's disease Paternal Aunt     X2  . Stomach cancer  Paternal Aunt     x1  . Arthritis/Rheumatoid Paternal Aunt     x1  . Neuropathy Brother     Peripheal  . Fibromyalgia Sister   . HIV Sister   . Arthritis/Rheumatoid Sister   . Diabetes Paternal Grandmother     SOCIAL HISTORY: Social History   Social History  . Marital status: Legally Separated    Spouse name: N/A  . Number of children: 2  . Years of education: 2 yrs coll   Occupational History  . Disabled    Social History Main Topics  . Smoking status: Former Smoker    Packs/day: 1.00    Years: 20.00    Types: Cigarettes    Quit date: 02/29/2004  . Smokeless tobacco: Never Used  . Alcohol use 0.0 oz/week     Comment: "glass of wine q once in awhile; not very often; do it on special occasion"  . Drug use: No  . Sexual activity: No   Other Topics Concern  . Not on file   Social History Narrative   Completed 2 years of college.    On disability.   Lives at home with her husband.   No caffeine use.   Left-handed.   Three children, 2 biological - 1 adopted.     PHYSICAL EXAM  Vitals:   01/05/16 1418  BP: (!) 156/84  Pulse: 68  Weight: 164 lb 9.6 oz (74.7 kg)  Height: 5' 2.5" (1.588 m)   Body mass index is 29.63 kg/m. General: The patient is alert and cooperative at the time of the examination. Neck: The neck is supple, Skin: Extremities are without significant edema.  Neurologic Exam Mental status: The patient is alert and oriented x 3 at the time of the examination. The patient has apparent normal recent and remote memory, with an apparently normal attention span and concentration ability. Cranial nerves: Facial symmetry is present. There is good sensation of the face to pinprick and soft touch on the right, decreased on the left face and forehead. The patient splits the midline with vibration sensation on the forehead, decreased on the left.  The strength of the facial muscles and the muscles to head turning and shoulder shrug are normal bilaterally.  Speech is well enunciated, no aphasia or dysarthria is noted.Pupils are equal, round, and reactive to light. Discs are flat bilaterally. Extraocular movements are full. Visual fields are full. The tongue is midline, and the patient has symmetric elevation of the soft palate. Patient is hard of hearing, cochlear implant on the left. Motor: The motor testing reveals 5 over 5 strength of all 4 extremities. Good symmetric motor tone is noted throughout. Sensory: Sensory testing is intact to pinprick, soft touch, vibration sensation, and position sense on all 4 extremities,  Coordination: Cerebellar testing reveals good finger-nose-finger and heel-to-shin bilaterally. Gait and station: Gait is normal. Tandem gait is very unsteady. Romberg is negative. No drift is seen. Reflexes: Deep tendon reflexes are symmetric and normal bilaterally. Toes are downgoing bilaterally.     DIAGNOSTIC DATA (LABS, IMAGING, TESTING) - I reviewed patient records, labs, notes, testing and imaging myself where available.  Lab Results  Component Value Date   WBC 8.8 06/02/2015   HGB 13.6 06/02/2015   HCT 41.1 06/02/2015   MCV 84.7 06/02/2015   PLT 252.0 06/02/2015      Component Value Date/Time   NA 136 01/04/2016 1630   K 3.7 01/04/2016 1630   CL 102 01/04/2016 1630   CO2 26 01/04/2016 1630   GLUCOSE 104 (H) 01/04/2016 1630   BUN 14 01/04/2016 1630   CREATININE 0.64 01/04/2016 1630   CALCIUM 9.9 01/04/2016 1630   PROT 6.8 06/02/2015 1123   ALBUMIN 4.4 06/02/2015 1123   AST 20 06/02/2015 1123   ALT 23 06/02/2015 1123   ALKPHOS 56 06/02/2015 1123   BILITOT 0.6 06/02/2015 1123   GFRNONAA >60 01/04/2016 1630   GFRAA >60 01/04/2016 1630     No results found for: VITAMINB12 Lab Results  Component Value Date   TSH 1.69 12/28/2015      ASSESSMENT AND PLAN  60 y.o. year old female  has a past medical history of migraine, fibromyalgia, vertigo/dizziness, hypertension and nonorganic neurologic  exam. PLAN: Patient's headache today was 0 on a pain scale of 1-10 Follow-up with vestibular rehabilitation, this has been set up by her primary care We will order meclizine to take when necessary dizziness Continue keep a record of blood pressure daily and report to primary care Migraine tracker APP to track frequency of headaches Follow up prn  Dennie Bible, [facility], [facility], [facility] [address], [place], [place] [address] [phone]

## 2016-01-05 NOTE — Progress Notes (Signed)
I have read the note, and I agree with the clinical assessment and plan.  WILLIS,CHARLES KEITH   

## 2016-01-05 NOTE — Patient Instructions (Signed)
Your BP looks great today. Suspect the BP is elevating only during acute migraines.  Continue chronic medications as directed.   I have given you a prescription for Clonidine to take if your BP is above 160/100. Check BP 20-30 minutes after taking. If not improving, you need to go to the ER. I feel once we get a better handle on headaches, we will not have this problem.  Follow-up with Dr. [name] (Neurology) today as scheduled. I want you to schedule a follow-up with Dr. Carlton Adam regarding ongoing weight loss.

## 2016-01-06 NOTE — Telephone Encounter (Signed)
Called pt back and apoligized for what she felt was rude behavior.

## 2016-01-06 NOTE — Telephone Encounter (Signed)
Pt called returned call .

## 2016-01-06 NOTE — Telephone Encounter (Signed)
TC to pts number rang and rang, no answer.

## 2016-01-07 ENCOUNTER — Other Ambulatory Visit: Payer: Self-pay | Admitting: Physician Assistant

## 2016-01-07 NOTE — Telephone Encounter (Signed)
Patient called she is needing to have rx for tens unit in order for the insurance to cover. Patient would like to pick it up from this office. She is planing to follow provider to [place] she also needs her pharmacy updated to [facility] [address] said one of her scrips was sent to [facility]   Her # [phone] please call when ready.

## 2016-01-08 ENCOUNTER — Telehealth: Payer: Self-pay | Admitting: Physician Assistant

## 2016-01-08 NOTE — Telephone Encounter (Signed)
Patient would like to have a nurse call her regarding this issue. Please advise  Patient phone: [phone]

## 2016-01-08 NOTE — Telephone Encounter (Signed)
Patient would like a nurse to call her regarding this issue.   Patient phone: [phone]

## 2016-01-08 NOTE — Telephone Encounter (Signed)
We do not have TENS units here in the office. Would have to get from supply store like [facility]. I will handwrite the Rx her to pickup or she can let us know where she wants to get the unit from so we can fax to that location.

## 2016-01-11 NOTE — Telephone Encounter (Signed)
Spoke with patient RE: TENS Unit M[facility] Ranger was ordered approx 5 weeks ago]. Patient states the [facility] needs order/documentation of order before they will reimburse patient for this equipment. Order was created on 11/09/15, printed and patient states that she will pick up in office/SLS 11/13

## 2016-01-11 NOTE — Telephone Encounter (Signed)
Please call patient to assess concerns.

## 2016-01-14 ENCOUNTER — Ambulatory Visit: Payer: 59 | Admitting: Physical Therapy

## 2016-01-18 ENCOUNTER — Telehealth: Payer: Self-pay | Admitting: Physician Assistant

## 2016-01-18 ENCOUNTER — Other Ambulatory Visit: Payer: Self-pay | Admitting: Emergency Medicine

## 2016-01-18 MED ORDER — TRAMADOL HCL 50 MG PO TABS: 50.0000 mg | ORAL_TABLET | Freq: Two times a day (BID) | ORAL | 0 refills | Status: DC | PRN

## 2016-01-18 NOTE — Telephone Encounter (Signed)
Reviewed [facility] database. No recent fills.  Would go ahead and refill same quantity and dosing instructions.

## 2016-01-18 NOTE — Telephone Encounter (Signed)
Spoke with patient. She takes her Losartan in the evening. She has been running around and very busy. She denies any chest pain, lightheadedness or dizziness. She is still out running around. She will go home and rest and relax and check her blood pressure. She will call back with readings.

## 2016-01-18 NOTE — Telephone Encounter (Signed)
After reviewing patient chart last rx 11/06/15 #10 0RF.  Please advise

## 2016-01-18 NOTE — Progress Notes (Signed)
Verbally called rx into the pharmacy.

## 2016-01-18 NOTE — Telephone Encounter (Signed)
Pt called back with readings, pt states that she has another Dr visit and BP was 154/80 Pt thinks there was something wrong with their machine. Pt states she took it again when she got home and it was fine. Pt also asking if she needs PT since her vertigo has gone away.

## 2016-01-18 NOTE — Telephone Encounter (Signed)
Patient calling to report she has misplaced her prescription for Tramadol and is requesting another rx. Patient states last refill of this medication was 08/07/15. Pharmacy:  [place], [place] [phone] (Phone) [phone] (Fax)

## 2016-01-18 NOTE — Telephone Encounter (Signed)
Make sure she has taken her Losartan as directed. Also make sure she was not stressed or rushed when they took BP. Did they repeat her BP at the end of the visit?  Assess for any symptoms -- chest pain, lightheadedness or dizziness -- that would prompt ER assessment.  If no symptoms, recommend she lay down and rest when home and recheck BP. Call us back for further assessment.  If any alarm signs present, she needs to go to ER.

## 2016-01-18 NOTE — Telephone Encounter (Signed)
I would recommend the physical therapy giving her history if she is having recurrent episodes. If she has not had any episodes if a few weeks then we can table the PT for now and set up again if the need arises.

## 2016-01-18 NOTE — Telephone Encounter (Signed)
Pt calling to report she just left her gyn's office and her blood pressure reading was 179/97.  Patient states she was told to report this to her pcp.

## 2016-01-18 NOTE — Telephone Encounter (Signed)
Left message on machine that rx was called into the pharmacy.

## 2016-01-20 NOTE — Telephone Encounter (Signed)
Advised patient will hold off on the Physical therapy if she is not having recurrent episodes. She is agreeable with that.

## 2016-01-25 ENCOUNTER — Telehealth: Payer: Self-pay | Admitting: Physician Assistant

## 2016-01-25 ENCOUNTER — Institutional Professional Consult (permissible substitution): Payer: 59 | Admitting: Neurology

## 2016-01-25 MED ORDER — ORPHENADRINE CITRATE ER 100 MG PO TB12: 100.0000 mg | ORAL_TABLET | Freq: Two times a day (BID) | ORAL | 1 refills | Status: DC | PRN

## 2016-01-25 NOTE — Telephone Encounter (Signed)
Norflex sent to the [facility]

## 2016-01-25 NOTE — Telephone Encounter (Signed)
Ok to send in refill with 1 additional refill

## 2016-01-25 NOTE — Telephone Encounter (Signed)
Last rx 12/23/15 # 60 Please advise

## 2016-01-25 NOTE — Telephone Encounter (Signed)
Pt states that she needs a refill on norflex, [facility] [address]

## 2016-01-28 ENCOUNTER — Other Ambulatory Visit: Payer: Self-pay | Admitting: Physician Assistant

## 2016-01-30 ENCOUNTER — Other Ambulatory Visit: Payer: Self-pay | Admitting: Physician Assistant

## 2016-02-01 ENCOUNTER — Other Ambulatory Visit: Payer: Self-pay | Admitting: Physician Assistant

## 2016-02-01 ENCOUNTER — Telehealth: Payer: Self-pay | Admitting: Physician Assistant

## 2016-02-01 ENCOUNTER — Encounter: Payer: Self-pay | Admitting: Physician Assistant

## 2016-02-01 ENCOUNTER — Ambulatory Visit (INDEPENDENT_AMBULATORY_CARE_PROVIDER_SITE_OTHER): Payer: 59 | Admitting: Physician Assistant

## 2016-02-01 VITALS — BP 132/84 | HR 63 | Temp 97.8°F | Resp 14 | Ht 62.5 in | Wt 155.0 lb

## 2016-02-01 DIAGNOSIS — M797 Fibromyalgia: Secondary | ICD-10-CM

## 2016-02-01 DIAGNOSIS — H8113 Benign paroxysmal vertigo, bilateral: Secondary | ICD-10-CM | POA: Diagnosis not present

## 2016-02-01 DIAGNOSIS — E785 Hyperlipidemia, unspecified: Secondary | ICD-10-CM

## 2016-02-01 DIAGNOSIS — I10 Essential (primary) hypertension: Secondary | ICD-10-CM | POA: Diagnosis not present

## 2016-02-01 HISTORY — DX: Hyperlipidemia, unspecified: E78.5

## 2016-02-01 LAB — COMPREHENSIVE METABOLIC PANEL
ALBUMIN: 4.2 g/dL (ref 3.5–5.2)
ALK PHOS: 62 U/L (ref 39–117)
ALT: 21 U/L (ref 0–35)
AST: 20 U/L (ref 0–37)
BILIRUBIN TOTAL: 0.4 mg/dL (ref 0.2–1.2)
BUN: 11 mg/dL (ref 6–23)
CO2: 28 mEq/L (ref 19–32)
CREATININE: 0.6 mg/dL (ref 0.40–1.20)
Calcium: 9.3 mg/dL (ref 8.4–10.5)
Chloride: 107 mEq/L (ref 96–112)
GFR: 108.82 mL/min (ref >60.00)
GLUCOSE: 83 mg/dL (ref 70–99)
Potassium: 4.2 mEq/L (ref 3.5–5.1)
SODIUM: 142 meq/L (ref 135–145)
TOTAL PROTEIN: 6.2 g/dL (ref 6.0–8.3)

## 2016-02-01 LAB — LIPID PANEL
Cholesterol: 137 mg/dL (ref 0–200)
HDL: 53.4 mg/dL (ref >39.00)
LDL Cholesterol: 66 mg/dL (ref 0–99)
NONHDL: 83.75
Total CHOL/HDL Ratio: 3
Triglycerides: 88 mg/dL (ref 0.0–149.0)
VLDL: 17.6 mg/dL (ref 0.0–40.0)

## 2016-02-01 MED ORDER — TRAMADOL HCL 50 MG PO TABS: 50.0000 mg | ORAL_TABLET | Freq: Two times a day (BID) | ORAL | 0 refills | Status: DC | PRN

## 2016-02-01 NOTE — Assessment & Plan Note (Signed)
Continue norflex. Will titrate Gabapentin. Will start by changing from 100 mg TID to 100 mg AM, 100 mg Noon and 300 mg each evening. FU discussed for further medication changes. Continue Tramadol prn.

## 2016-02-01 NOTE — Telephone Encounter (Signed)
DUPLICATE, Rx to pharmacy 02/01/16, #30 with 0 refill/SLS 12/04

## 2016-02-01 NOTE — Progress Notes (Signed)
Pre visit review using our clinic review tool, if applicable. No additional management support is needed unless otherwise documented below in the visit note. 

## 2016-02-01 NOTE — Assessment & Plan Note (Signed)
Referral to vestibular rehabilitation placed.

## 2016-02-01 NOTE — Telephone Encounter (Signed)
Verbally called in the Tramadol 50 mg rx to the [place] for #30 0RF

## 2016-02-01 NOTE — Telephone Encounter (Signed)
Barnetta Chapel with [facility] calling for clarification on dosage of traMADol Veatrice Bourbon).  Please return call to pharmacy at [phone].

## 2016-02-01 NOTE — Assessment & Plan Note (Signed)
Tolerating statin well. Will check lipids and lfts today. Discussed diet and exercise. Exercise limited 2/2 fibromyalgia.

## 2016-02-01 NOTE — Progress Notes (Signed)
Patient presents to clinic today for follow-up of hyperlipidemia, hypertension and fibromyalgia. Patient also note some recurrence of her positional vertigo over the past week.  Hyperlipidemia -- Is currently on Crestor 20 mg daily. Endorses taking as directed. Denies side effect of medication. Patient is overdue for repeat labs. Is fasting this morning.   Hypertension -- Patient currently on Toprol XL 75 mg daily and losartan 50 mg daily. Patient denies chest pain, palpitations, lightheadedness, dizziness, vision changes or frequent headaches.  BP Readings from Last 3 Encounters:  02/01/16 132/84  01/05/16 (!) 156/84  01/05/16 122/78   Fibromyalgia -- Currently on norflex 1-2 x day, Gabapentin 100 mg TID and Tramadol PRN. Endorses flare-up of symptoms over the past week secondary to change in weather. Also notes some increased stressors that she feels is contributing. Is only taking Tramadol maybe 1-2 x week. Other medications she endorses taking as directed.   Patient with history of BPPV, noting recurrence over the past week. 1-2 episodes. Previously declines vestibular therapy but would now like to give it a try. Denies nausea/vomiting, vision change, headache, LH.    Past Medical History:  Diagnosis Date  . Anxiety   . Asthma   . Chronic high back pain   . Double vision   . Fibromyalgia   . Heart murmur   . High cholesterol   . History of bronchitis    "last time ~ 2009, before I had pneumonia"  . Hyperparathyroidism ([place])   . Hypertension   . Hypoglycemia   . Kidney stone   . Knee pain, right   . Meniscus tear    Right  . Migraine    "used to have them often"  . Migraine 08/07/2015  . Pneumonia ~ 2009  . PTSD (post-traumatic stress disorder)   . Schizophrenia ([place])    treated by Dr. Casimiro Needle  . Seizure ([place]) 07/07/11  . Seizures ([place])    last seizure 3 years ago, not on AED  . Stroke ([place]) 07/08/11   "I've had mini strokes before; left side of face  is more down than  right "  . Syncope and collapse    One episode - 07/10/15    Current Outpatient Prescriptions on File Prior to Visit  Medication Sig Dispense Refill  . ADVAIR HFA 115-21 MCG/ACT inhaler INHALE ONE PUFF BY MOUTH TWICE DAILY 12 g 5  . albuterol (PROVENTIL) (2.5 MG/3ML) 0.083% nebulizer solution Take 3 mLs (2.5 mg total) by nebulization every 6 (six) hours as needed for wheezing or shortness of breath. 75 mL 5  . cloNIDine (CATAPRES) 0.1 MG tablet Take 1 tablet by mouth if BP rises above 160/100 with migraine 30 tablet 0  . fluticasone (FLONASE) 50 MCG/ACT nasal spray USE TWO SPRAY(S) IN EACH NOSTRIL ONCE DAILY 16 g 5  . gabapentin (NEURONTIN) 100 MG capsule TAKE ONE CAPSULE BY MOUTH ONCE DAILY IN THE MORNING, ONE CAPSULE AT NOON, AND THREE CAPSULES IN THE EVENING (Patient taking differently: TAKE ONE CAPSULE BY MOUTH ONCE DAILY IN THE MORNING, ONE CAPSULE AT NOON, AND ONE CAPSULES IN THE EVENING) 150 capsule 2  . levalbuterol (XOPENEX HFA) 45 MCG/ACT inhaler INHALE TWO PUFFS EVERY 4 TO 6 HOURS AS NEEDED 15 g 5  . losartan (COZAAR) 25 MG tablet Take 1 tablet (25 mg total) by mouth 2 (two) times daily. 60 tablet 3  . meclizine (ANTIVERT) 25 MG tablet Take 1 tablet (25 mg total) by mouth 2 (two) times daily as needed for dizziness.  30 tablet 2  . metoprolol succinate (TOPROL-XL) 25 MG 24 hr tablet Take 3 tablets (75 mg total) by mouth daily. 270 tablet 1  . Multiple Vitamin (THERA) TABS Take by mouth.    . orphenadrine (NORFLEX) 100 MG tablet Take 1 tablet (100 mg total) by mouth 2 (two) times daily as needed for muscle spasms. 60 tablet 1  . promethazine (PHENERGAN) 12.5 MG tablet Take 1 tablet (12.5 mg total) by mouth every 6 (six) hours as needed for nausea. Reported on 03/04/2015 20 tablet 0  . rosuvastatin (CRESTOR) 20 MG tablet TAKE ONE TABLET BY MOUTH ONCE DAILY IN THE EVENING 90 tablet 0  . zolpidem (AMBIEN) 10 MG tablet Take 20 mg by mouth at bedtime as needed.    Marland Kitchen LORazepam (ATIVAN PO) Take 1  mg by mouth as needed (Takes 0.20m).      No current facility-administered medications on file prior to visit.     Allergies  Allergen Reactions  . Sulfa Antibiotics Hives  . Metronidazole Nausea And Vomiting  . Acyclovir And Related   . Benadryl [Diphenhydramine Hcl]     Severe emotional reaction and doesn't work well with Pt. Past history  . Cephalosporins Hives  . Codeine Other (See Comments)    Makes patient feel odd ; "sometimes mild; sometimes severe reaction; mostly severe"  . Cyclobenzaprine Other (See Comments)    Muscle Aches.  . Diclofenac Sodium Nausea And Vomiting  . Linzess [Linaclotide] Diarrhea  . Nitrofurantoin Monohyd Macro     Felt funny  . Prednisone Other (See Comments)    migraine  . Soma [Carisoprodol] Other (See Comments)    Cognitive Function, Daytime sleepiness  . Tizanidine     Other reaction(s): Other (See Comments) insomnia  . Baclofen Nausea Only  . Meloxicam     Other reaction(s): Confusion  . Sulfamethoxazole Rash    Family History  Problem Relation Age of Onset  . Heart attack Father 729   Living  . Arthritis Father   . Skin cancer Father   . Hypertension Father   . Hyperlipidemia Father   . Leukemia Mother 464   Deceased  . Alzheimer's disease Paternal Aunt     X2  . Stomach cancer Paternal Aunt     x1  . Arthritis/Rheumatoid Paternal Aunt     x1  . Neuropathy Brother     Peripheal  . Fibromyalgia Sister   . HIV Sister   . Arthritis/Rheumatoid Sister   . Diabetes Paternal Grandmother     Social History   Social History  . Marital status: Legally Separated    Spouse name: N/A  . Number of children: 2  . Years of education: 2 yrs coll   Occupational History  . Disabled    Social History Main Topics  . Smoking status: Former Smoker    Packs/day: 1.00    Years: 20.00    Types: Cigarettes    Quit date: 02/29/2004  . Smokeless tobacco: Never Used  . Alcohol use 0.0 oz/week     Comment: "glass of wine q once in  awhile; not very often; do it on special occasion"  . Drug use: No  . Sexual activity: No   Other Topics Concern  . None   Social History Narrative   Completed 2 years of college.    On disability.   Lives at home with her husband.   No caffeine use.   Left-handed.   Three children, 2 biological - 1  adopted.   Review of Systems - See HPI.  All other ROS are negative.  BP 132/84   Pulse 63   Temp 97.8 F (36.6 C) (Oral)   Resp 14   Ht 5' 2.5" (1.588 m)   Wt 155 lb (70.3 kg)   SpO2 93%   BMI 27.90 kg/m   Physical Exam  Constitutional: She is oriented to person, place, and time and well-developed, well-nourished, and in no distress.  HENT:  Head: Normocephalic and atraumatic.  Right Ear: External ear normal.  Left Ear: External ear normal.  Nose: Nose normal.  TM within normal limits bilaterally  Eyes: Conjunctivae are normal.  Neck: Neck supple.  Cardiovascular: Normal rate, regular rhythm, normal heart sounds and intact distal pulses.   Pulmonary/Chest: Effort normal and breath sounds normal. No respiratory distress. She has no wheezes. She has no rales. She exhibits no tenderness.  Neurological: She is alert and oriented to person, place, and time.  Skin: Skin is warm and dry. No rash noted.  Psychiatric: Affect normal.  Vitals reviewed.   Recent Results (from the past 2160 hour(s))  CULTURE, URINE COMPREHENSIVE     Status: None   Collection Time: 12/28/15 12:32 PM  Result Value Ref Range   Colony Count 50,000-100,000 CFU/mL    Organism ID, Bacteria CORYNEBACTERIUM SPECIES   TSH     Status: None   Collection Time: 12/28/15 12:36 PM  Result Value Ref Range   TSH 1.69 0.35 - 4.50 uIU/mL  POCT urinalysis dipstick     Status: Abnormal   Collection Time: 12/28/15 12:45 PM  Result Value Ref Range   Color, UA straw    Clarity, UA clear    Glucose, UA neg    Bilirubin, UA neg    Ketones, UA neg    Spec Grav, UA 1.020    Blood, UA negative    pH, UA 6.5     Protein, UA negative    Urobilinogen, UA 0.2    Nitrite, UA negative    Leukocytes, UA Trace (A) Negative  Basic metabolic panel     Status: Abnormal   Collection Time: 01/04/16  4:30 PM  Result Value Ref Range   Sodium 136 135 - 145 mmol/L   Potassium 3.7 3.5 - 5.1 mmol/L   Chloride 102 101 - 111 mmol/L   CO2 26 22 - 32 mmol/L   Glucose, Bld 104 (H) 65 - 99 mg/dL   BUN 14 6 - 20 mg/dL   Creatinine, Ser 0.64 0.44 - 1.00 mg/dL   Calcium 9.9 8.9 - 10.3 mg/dL   GFR calc non Af Amer >60 >60 mL/min   GFR calc Af Amer >60 >60 mL/min    Comment: (NOTE) The eGFR has been calculated using the CKD EPI equation. This calculation has not been validated in all clinical situations. eGFR's persistently <60 mL/min signify possible Chronic Kidney Disease.    Anion gap 8 5 - 15   Assessment/Plan: Hyperlipidemia Tolerating statin well. Will check lipids and lfts today. Discussed diet and exercise. Exercise limited 2/2 fibromyalgia.   Fibromyalgia Continue norflex. Will titrate Gabapentin. Will start by changing from 100 mg TID to 100 mg AM, 100 mg Noon and 300 mg each evening. FU discussed for further medication changes. Continue Tramadol prn.   Essential hypertension Continue current medication regimen. Work on [employer]. Will monitor.   Benign paroxysmal positional vertigo Referral to vestibular rehabilitation placed.    Leeanne Rio, PA-C

## 2016-02-01 NOTE — Telephone Encounter (Signed)
Refill sent per [facility] refill protocol/SLS  

## 2016-02-01 NOTE — Assessment & Plan Note (Signed)
Continue current medication regimen. Work on [employer]. Will monitor.

## 2016-02-01 NOTE — Patient Instructions (Signed)
Please go to the lab for blood work. I will call you with your results.   Please continue chronic medications as directed with the following exception:   Gabapentin -- Take 1 tablet by mouth each morning and at noon. Take 3 tablets (300 mg) by mouth each evening.   Follow-up with me in 1-2 week via phone regarding pain and toleration of new Gabapentin dose. We may need to titrate further.   You will get a phone call from Physical Therapy for vestibular rehabilitation.  Continue Meclizine PRN.  Follow-up with me in office in 3-4 weeks.

## 2016-02-02 ENCOUNTER — Other Ambulatory Visit: Payer: Self-pay | Admitting: Emergency Medicine

## 2016-02-02 ENCOUNTER — Telehealth: Payer: Self-pay | Admitting: Physician Assistant

## 2016-02-02 MED ORDER — GABAPENTIN 100 MG PO CAPS: ORAL_CAPSULE | ORAL | 3 refills | Status: DC

## 2016-02-02 NOTE — Telephone Encounter (Signed)
Re-sent in Gabapentin with correct directions of take 1 capsule in am, afternoon and 3 capsules in evening. #150

## 2016-02-02 NOTE — Telephone Encounter (Signed)
Pt states that [facility] is not getting the refill correct on her gabapentin and asking for [place] to call them, pt states that they will not call us and leaving it for her to do.

## 2016-02-09 ENCOUNTER — Encounter: Payer: Self-pay | Admitting: Family Medicine

## 2016-02-09 ENCOUNTER — Ambulatory Visit (INDEPENDENT_AMBULATORY_CARE_PROVIDER_SITE_OTHER): Payer: 59 | Admitting: Family Medicine

## 2016-02-09 ENCOUNTER — Ambulatory Visit: Payer: 59 | Admitting: Physical Therapy

## 2016-02-09 ENCOUNTER — Telehealth: Payer: Self-pay | Admitting: Physician Assistant

## 2016-02-09 VITALS — BP 154/78 | HR 72 | Temp 98.1°F | Resp 20 | Wt 157.2 lb

## 2016-02-09 DIAGNOSIS — R6889 Other general symptoms and signs: Secondary | ICD-10-CM

## 2016-02-09 DIAGNOSIS — J0100 Acute maxillary sinusitis, unspecified: Secondary | ICD-10-CM | POA: Diagnosis not present

## 2016-02-09 LAB — POCT INFLUENZA A/B
INFLUENZA B, POC: NEGATIVE
Influenza A, POC: NEGATIVE

## 2016-02-09 MED ORDER — DOXYCYCLINE HYCLATE 100 MG PO TABS: 100.0000 mg | ORAL_TABLET | Freq: Two times a day (BID) | ORAL | 0 refills | Status: DC

## 2016-02-09 NOTE — Progress Notes (Signed)
Jasmine [name] , 20-Dec-1955, 60 y.o., female MRN: [id number] Patient Care Team    Relationship Specialty Notifications Start End  Brunetta Jeans, PA-C PCP - General Physician Assistant  12/05/14     CC: cough  Subjective: Pt presents for an acute OV with complaints of cough  Of 2 days duration.  Associated symptoms include chest burning, nasal congestion, fatigue and sore throat. She states she feel really bad, worse than prior sinus infections and bronchitis. She has tried dayquil and 2 of her husbands left over abx. She has been around a person with pneumonia and another with sore throat. She has a history of asthma and has needed to use infaler x1 only. States she did not have the flu shot in August (documented as such).  Allergies  Allergen Reactions  . Sulfa Antibiotics Hives  . Metronidazole Nausea And Vomiting  . Acyclovir And Related   . Benadryl [Diphenhydramine Hcl]     Severe emotional reaction and doesn't work well with Pt. Past history  . Cephalosporins Hives  . Codeine Other (See Comments)    Makes patient feel odd ; "sometimes mild; sometimes severe reaction; mostly severe"  . Cyclobenzaprine Other (See Comments)    Muscle Aches.  . Diclofenac Sodium Nausea And Vomiting  . Linzess [Linaclotide] Diarrhea  . Nitrofurantoin Monohyd Macro     Felt funny  . Prednisone Other (See Comments)    migraine  . Soma [Carisoprodol] Other (See Comments)    Cognitive Function, Daytime sleepiness  . Tizanidine     Other reaction(s): Other (See Comments) insomnia  . Baclofen Nausea Only  . Meloxicam     Other reaction(s): Confusion  . Sulfamethoxazole Rash   Social History  Substance Use Topics  . Smoking status: Former Smoker    Packs/day: 1.00    Years: 20.00    Types: Cigarettes    Quit date: 02/29/2004  . Smokeless tobacco: Never Used  . Alcohol use 0.0 oz/week     Comment: "glass of wine q once in awhile; not very often; do it on special occasion"   Past Medical  History:  Diagnosis Date  . Anxiety   . Asthma   . Chronic high back pain   . Double vision   . Fibromyalgia   . Heart murmur   . High cholesterol   . History of bronchitis    "last time ~ 2009, before I had pneumonia"  . Hyperparathyroidism ([place])   . Hypertension   . Hypoglycemia   . Kidney stone   . Knee pain, right   . Meniscus tear    Right  . Migraine    "used to have them often"  . Migraine 08/07/2015  . Pneumonia ~ 2009  . PTSD (post-traumatic stress disorder)   . Schizophrenia ([place])    treated by Dr. Casimiro Needle  . Seizure ([place]) 07/07/11  . Seizures ([place])    last seizure 3 years ago, not on AED  . Stroke ([place]) 07/08/11   "I've had mini strokes before; left side of face  is more down than right "  . Syncope and collapse    One episode - 07/10/15   Past Surgical History:  Procedure Laterality Date  . COCHLEAR IMPLANT  2010   left  . [place]   left  . KIDNEY STONE SURGERY  ~ 2006  . TONSILLECTOMY     "as a child"  . WISDOM TOOTH EXTRACTION     Family History  Problem Relation Age of Onset  . Heart attack Father 44    Living  . Arthritis Father   . Skin cancer Father   . Hypertension Father   . Hyperlipidemia Father   . Leukemia Mother 39    Deceased  . Alzheimer's disease Paternal Aunt     X2  . Stomach cancer Paternal Aunt     x1  . Arthritis/Rheumatoid Paternal Aunt     x1  . Neuropathy Brother     Peripheal  . Fibromyalgia Sister   . HIV Sister   . Arthritis/Rheumatoid Sister   . Diabetes Paternal Grandmother      Medication List       Accurate as of 02/09/16 11:02 AM. Always use your most recent med list.          ADVAIR HFA 115-21 MCG/ACT inhaler Generic drug:  fluticasone-salmeterol INHALE ONE PUFF BY MOUTH TWICE DAILY   albuterol (2.5 MG/3ML) 0.083% nebulizer solution Commonly known as:  PROVENTIL Take 3 mLs (2.5 mg total) by nebulization every 6 (six) hours as needed for wheezing or shortness of breath.     ATIVAN PO Take 1 mg by mouth as needed (Takes 0.5mg ).   cloNIDine 0.1 MG tablet Commonly known as:  CATAPRES Take 1 tablet by mouth if BP rises above 160/100 with migraine   fluticasone 50 MCG/ACT nasal spray Commonly known as:  FLONASE USE TWO SPRAY(S) IN EACH NOSTRIL ONCE DAILY   gabapentin 100 MG capsule Commonly known as:  NEURONTIN TAKE ONE CAPSULE BY MOUTH ONCE DAILY IN THE MORNING, ONE CAPSULE AT NOON, AND THREE CAPSULES IN THE EVENING   levalbuterol 45 MCG/ACT inhaler Commonly known as:  XOPENEX HFA INHALE TWO PUFFS EVERY 4 TO 6 HOURS AS NEEDED   losartan 25 MG tablet Commonly known as:  COZAAR Take 1 tablet (25 mg total) by mouth 2 (two) times daily.   meclizine 25 MG tablet Commonly known as:  ANTIVERT Take 1 tablet (25 mg total) by mouth 2 (two) times daily as needed for dizziness.   metoprolol succinate 25 MG 24 hr tablet Commonly known as:  TOPROL-XL Take 3 tablets (75 mg total) by mouth daily.   orphenadrine 100 MG tablet Commonly known as:  NORFLEX Take 1 tablet (100 mg total) by mouth 2 (two) times daily as needed for muscle spasms.   promethazine 12.5 MG tablet Commonly known as:  PHENERGAN Take 1 tablet (12.5 mg total) by mouth every 6 (six) hours as needed for nausea. Reported on 03/04/2015   rosuvastatin 20 MG tablet Commonly known as:  CRESTOR TAKE ONE TABLET BY MOUTH ONCE DAILY IN THE EVENING   temazepam 15 MG capsule Commonly known as:  RESTORIL   THERA Tabs Take by mouth.   traMADol 50 MG tablet Commonly known as:  ULTRAM Take 1 tablet (50 mg total) by mouth every 12 (twelve) hours as needed.   zolpidem 10 MG tablet Commonly known as:  AMBIEN Take 20 mg by mouth at bedtime as needed.       No results found for this or any previous visit (from the past 24 hour(s)). No results found.   ROS: Negative, with the exception of above mentioned in HPI   Objective:  BP (!) 154/78 (BP Location: Right Arm, Patient Position: Sitting, Cuff  Size: Normal)   Pulse 72   Temp 98.1 F (36.7 C)   Resp 20   Wt 157 lb 4 oz (71.3 kg)   SpO2 97%   BMI  28.30 kg/m  Body mass index is 28.3 kg/m. Gen: Afebrile. No acute distress. Nontoxic in appearance, well developed, well nourished. HOH female.  HENT: AT. [place]. Bilateral TM unable to be visualized hearing aids) MMM, no oral lesions. Bilateral nares with erythema. Throat with mild erythema, no exudates. mild cough, mild hoarseness, TTP left max sinus.  Eyes:Pupils Equal Round Reactive to light, Extraocular movements intact,  Conjunctiva without redness, discharge or icterus. Neck/lymp/endocrine: Supple,no lymphadenopathy CV: RRR  Chest: CTAB, no wheeze or crackles. Good air movement, normal resp effort.  Abd: Soft. NTND. BS present Skin: no rashes, purpura or petechiae.  Neuro:  Normal gait. PERLA. EOMi. Alert. Oriented x3     Assessment/Plan: Jasmine [name] is a 60 y.o. female present for acute OV for  Flu-like symptoms - did not appear to be flu, however pt states she did not have flu shot (as documented) and was upset it was on her record. She wanted to be tested.  - POCT Influenza A/B--> negative - attempted to tell her the flu shot is being imported into our system, so it is coming from an outside source, not [place]. She was seen in the [facility] the day prior, but she states she has never been seen at [facility]... ? Regardless, would not complete flu shot today, and she can discuss with her PCP after acute illness.   Acute maxillary sinusitis, recurrence not specified Flonase, mucinex DM Doxycyline. BID x 10 days Rest and hydrate.  F/u PRN  electronically signed by:  Howard Pouch, DO  [place]

## 2016-02-09 NOTE — Telephone Encounter (Signed)
Called pt, her main complaint is that she feels like she was misdiagnosed and not listened to at appt earlier today. States 'I know my body and I know what a sinus infection feels like, and this is not a sinus infection. This feels like I have a PNA.' Pt denies CP and SOB/difficulty breathing, but does state chest muscles are sore from coughing. She is able to speak in complete sentences w/o difficulty over the phone. Reports strong cough, productive of clear sputum. 'When I cough, it hurts.' Denies fever. She is taking doxy as directed, but would also like an rx to help w/ cough and chest congestion. Her son is being released from prison this weekend and she 'has to be better by then.' She is scheduled for acute appt w/ PCP tomorrow, w/ instructions to seek immediate care if any SOB, CP, or worsening of symptoms. Please advise rx requests and any additional recommendations.

## 2016-02-09 NOTE — Patient Instructions (Addendum)
Start Doxycyline every 12 hours for 10 days.  Start mucinex DM Continue flonase  You do not have the flu. Your flu test was negative. I believe you have a sinus infection.

## 2016-02-09 NOTE — Telephone Encounter (Signed)
Patient Name: Jasmine [name] DOB: Jun 26, 1955 Initial Comment Caller states, she is having issue with chest. Dr. told her that she has a sinus infection. Chest pain. She couldn't do much yesterday. Was seen last in [place]. Verified Dr. Elyn Aquas. Nurse Assessment Nurse: Chestine Spore, RN, [place] Date/Time (Eastern Time): 02/09/2016 12:28:53 PM Confirm and document reason for call. If symptomatic, describe symptoms. ---caller states having coughing. feels like bronchitis. no temp Does the patient have any new or worsening symptoms? ---Yes Will a triage be completed? ---Yes Related visit to physician within the last 2 weeks? ---No Does the PT have any chronic conditions? (i.e. diabetes, asthma, etc.) ---No Is this a behavioral health or substance abuse call? ---No Guidelines Guideline Title Affirmed Question Affirmed Notes Sinus Infection on Antibiotic Follow-up Call [1] Difficulty breathing AND [2] not from stuffy nose (e.g., not relieved by cleaning out the nose) Final Disposition User Go to ED Now Chestine Spore, RN, [place] Comments caller states was seen today with DX sinus infection. doxycycline and mucenex prescribed. caller states thinks it is bad bronchitis or pneumonia and not sinus infection. she continues to affirm she is having difficulty breathing. triage result is ED eval. caller states will wait to see if medication will work Referrals [place] UNDECIDED Disagree/Comply: Disagree Disagree/Comply Reason: Wait and see

## 2016-02-10 ENCOUNTER — Ambulatory Visit (INDEPENDENT_AMBULATORY_CARE_PROVIDER_SITE_OTHER): Payer: 59 | Admitting: Physician Assistant

## 2016-02-10 ENCOUNTER — Ambulatory Visit (HOSPITAL_BASED_OUTPATIENT_CLINIC_OR_DEPARTMENT_OTHER)
Admission: RE | Admit: 2016-02-10 | Discharge: 2016-02-10 | Disposition: A | Discharge status: Home / Self Care | Payer: 59 | Source: Ambulatory Visit | Attending: Physician Assistant | Admitting: Physician Assistant

## 2016-02-10 ENCOUNTER — Encounter: Payer: Self-pay | Admitting: Physician Assistant

## 2016-02-10 ENCOUNTER — Ambulatory Visit: Payer: 59 | Admitting: Physical Therapy

## 2016-02-10 VITALS — BP 140/90 | HR 75 | Temp 98.0°F | Resp 16 | Ht 62.5 in | Wt 155.0 lb

## 2016-02-10 DIAGNOSIS — J069 Acute upper respiratory infection, unspecified: Secondary | ICD-10-CM | POA: Diagnosis present

## 2016-02-10 DIAGNOSIS — R918 Other nonspecific abnormal finding of lung field: Secondary | ICD-10-CM | POA: Diagnosis not present

## 2016-02-10 DIAGNOSIS — R3915 Urgency of urination: Secondary | ICD-10-CM

## 2016-02-10 LAB — POCT URINALYSIS DIPSTICK
Bilirubin, UA: NEGATIVE
Glucose, UA: NEGATIVE
Ketones, UA: NEGATIVE
Leukocytes, UA: NEGATIVE
Nitrite, UA: NEGATIVE
PH UA: 6.5
PROTEIN UA: NEGATIVE
RBC UA: NEGATIVE
SPEC GRAV UA: 1.01
UROBILINOGEN UA: NEGATIVE

## 2016-02-10 MED ORDER — BENZONATATE 100 MG PO CAPS
100.0000 mg | ORAL_CAPSULE | Freq: Three times a day (TID) | ORAL | 0 refills | Status: DC | PRN
Start: 2016-02-10 — End: 2016-02-17

## 2016-02-10 NOTE — Progress Notes (Signed)
Patient presents to clinic today c/o continued body aches, fatigue, chest congestion and cough. Was seen yesterday at the [facility] office and diagnosed with flu-like symptoms and sinusitis. Symptoms have been present now for 4.5 days. Patient had negative flu swab and has outside documentation of a flu shot this year. Doxycycline started for sinusitis. Supportive measures started for viral symptoms. Is taking Doxycycline as directed. Patient endorses cough is constant, sometimes productive of yellow sputum. Denies shortness of breath or wheezing but does note some pain with coughing and and deep breathing. Denies recent travel or sick contact. Denies lew swelling, recent surgery or prolonged immobilization. Has Xopenex but has not had to increase use. Patient denies new or worsening symptoms since yesterday's visit. Is trying to feel better so she can see her son on Friday.   Patient also notes some mild suprapubic pressure and urgency without hematuria, dysuria, flank pain or back pain. Is hydrating well.    Past Medical History:  Diagnosis Date  . Anxiety   . Asthma   . Chronic high back pain   . Double vision   . Fibromyalgia   . Heart murmur   . High cholesterol   . History of bronchitis    "last time ~ 2009, before I had pneumonia"  . Hyperparathyroidism ([place])   . Hypertension   . Hypoglycemia   . Kidney stone   . Knee pain, right   . Meniscus tear    Right  . Migraine    "used to have them often"  . Migraine 08/07/2015  . Pneumonia ~ 2009  . PTSD (post-traumatic stress disorder)   . Schizophrenia ([place])    treated by Dr. Casimiro Needle  . Seizure ([place]) 07/07/11  . Seizures ([place])    last seizure 3 years ago, not on AED  . Stroke ([place]) 07/08/11   "I've had mini strokes before; left side of face  is more down than right "  . Syncope and collapse    One episode - 07/10/15    Current Outpatient Prescriptions on File Prior to Visit  Medication Sig Dispense Refill  . ADVAIR HFA 115-21  MCG/ACT inhaler INHALE ONE PUFF BY MOUTH TWICE DAILY 12 g 5  . albuterol (PROVENTIL) (2.5 MG/3ML) 0.083% nebulizer solution Take 3 mLs (2.5 mg total) by nebulization every 6 (six) hours as needed for wheezing or shortness of breath. 75 mL 5  . cloNIDine (CATAPRES) 0.1 MG tablet Take 1 tablet by mouth if BP rises above 160/100 with migraine 30 tablet 0  . doxycycline (VIBRA-TABS) 100 MG tablet Take 1 tablet (100 mg total) by mouth 2 (two) times daily. 20 tablet 0  . fluticasone (FLONASE) 50 MCG/ACT nasal spray USE TWO SPRAY(S) IN EACH NOSTRIL ONCE DAILY 16 g 5  . gabapentin (NEURONTIN) 100 MG capsule TAKE ONE CAPSULE BY MOUTH ONCE DAILY IN THE MORNING, ONE CAPSULE AT NOON, AND THREE CAPSULES IN THE EVENING 150 capsule 3  . levalbuterol (XOPENEX HFA) 45 MCG/ACT inhaler INHALE TWO PUFFS EVERY 4 TO 6 HOURS AS NEEDED 15 g 5  . losartan (COZAAR) 25 MG tablet Take 1 tablet (25 mg total) by mouth 2 (two) times daily. 60 tablet 3  . meclizine (ANTIVERT) 25 MG tablet Take 1 tablet (25 mg total) by mouth 2 (two) times daily as needed for dizziness. 30 tablet 2  . metoprolol succinate (TOPROL-XL) 25 MG 24 hr tablet Take 3 tablets (75 mg total) by mouth daily. 270 tablet 1  . Multiple Vitamin (  THERA) TABS Take by mouth.    . orphenadrine (NORFLEX) 100 MG tablet Take 1 tablet (100 mg total) by mouth 2 (two) times daily as needed for muscle spasms. 60 tablet 1  . rosuvastatin (CRESTOR) 20 MG tablet TAKE ONE TABLET BY MOUTH ONCE DAILY IN THE EVENING 90 tablet 0  . temazepam (RESTORIL) 15 MG capsule     . traMADol (ULTRAM) 50 MG tablet Take 1 tablet (50 mg total) by mouth every 12 (twelve) hours as needed. 30 tablet 0  . zolpidem (AMBIEN) 10 MG tablet Take 20 mg by mouth at bedtime as needed.    Marland Kitchen LORazepam (ATIVAN PO) Take 1 mg by mouth as needed (Takes 0.36m).     . promethazine (PHENERGAN) 12.5 MG tablet Take 1 tablet (12.5 mg total) by mouth every 6 (six) hours as needed for nausea. Reported on 03/04/2015 (Patient  not taking: Reported on 02/10/2016) 20 tablet 0   No current facility-administered medications on file prior to visit.     Allergies  Allergen Reactions  . Sulfa Antibiotics Hives  . Metronidazole Nausea And Vomiting  . Acyclovir And Related   . Benadryl [Diphenhydramine Hcl]     Severe emotional reaction and doesn't work well with Pt. Past history  . Cephalosporins Hives  . Codeine Other (See Comments)    Makes patient feel odd ; "sometimes mild; sometimes severe reaction; mostly severe"  . Cyclobenzaprine Other (See Comments)    Muscle Aches.  . Diclofenac Sodium Nausea And Vomiting  . Linzess [Linaclotide] Diarrhea  . Nitrofurantoin Monohyd Macro     Felt funny  . Prednisone Other (See Comments)    migraine  . Soma [Carisoprodol] Other (See Comments)    Cognitive Function, Daytime sleepiness  . Tizanidine     Other reaction(s): Other (See Comments) insomnia  . Baclofen Nausea Only  . Meloxicam     Other reaction(s): Confusion  . Sulfamethoxazole Rash    Family History  Problem Relation Age of Onset  . Heart attack Father 745   Living  . Arthritis Father   . Skin cancer Father   . Hypertension Father   . Hyperlipidemia Father   . Leukemia Mother 473   Deceased  . Alzheimer's disease Paternal Aunt     X2  . Stomach cancer Paternal Aunt     x1  . Arthritis/Rheumatoid Paternal Aunt     x1  . Neuropathy Brother     Peripheal  . Fibromyalgia Sister   . HIV Sister   . Arthritis/Rheumatoid Sister   . Diabetes Paternal Grandmother     Social History   Social History  . Marital status: Legally Separated    Spouse name: N/A  . Number of children: 2  . Years of education: 2 yrs coll   Occupational History  . Disabled    Social History Main Topics  . Smoking status: Former Smoker    Packs/day: 1.00    Years: 20.00    Types: Cigarettes    Quit date: 02/29/2004  . Smokeless tobacco: Never Used  . Alcohol use 0.0 oz/week     Comment: "glass of wine q once  in awhile; not very often; do it on special occasion"  . Drug use: No  . Sexual activity: No   Other Topics Concern  . None   Social History Narrative   Completed 2 years of college.    On disability.   Lives at home with her husband.   No caffeine use.  Left-handed.   Three children, 2 biological - 1 adopted.   Review of Systems - See HPI.  All other ROS are negative.  BP 140/90   Pulse 75   Temp 98 F (36.7 C) (Oral)   Resp 16   Ht 5' 2.5" (1.588 m)   Wt 155 lb (70.3 kg)   SpO2 98%   BMI 27.90 kg/m   Physical Exam  Constitutional: She is oriented to person, place, and time and well-developed, well-nourished, and in no distress.  HENT:  Head: Normocephalic and atraumatic.  Right Ear: External ear normal.  Left Ear: External ear normal.  Nose: Nose normal.  Mouth/Throat: Oropharynx is clear and moist. No oropharyngeal exudate.  Eyes: Conjunctivae are normal.  Neck: Neck supple.  Cardiovascular: Normal rate, regular rhythm, normal heart sounds and intact distal pulses.   Pulmonary/Chest: Effort normal and breath sounds normal. No respiratory distress. She has no wheezes. She has no rales. She exhibits tenderness.  Neurological: She is alert and oriented to person, place, and time.  Skin: Skin is warm and dry. No rash noted.  Psychiatric: Affect normal.  Vitals reviewed.  Recent Results (from the past 2160 hour(s))  CULTURE, URINE COMPREHENSIVE     Status: None   Collection Time: 12/28/15 12:32 PM  Result Value Ref Range   Colony Count 50,000-100,000 CFU/mL    Organism ID, Bacteria CORYNEBACTERIUM SPECIES   TSH     Status: None   Collection Time: 12/28/15 12:36 PM  Result Value Ref Range   TSH 1.69 0.35 - 4.50 uIU/mL  POCT urinalysis dipstick     Status: Abnormal   Collection Time: 12/28/15 12:45 PM  Result Value Ref Range   Color, UA straw    Clarity, UA clear    Glucose, UA neg    Bilirubin, UA neg    Ketones, UA neg    Spec Grav, UA 1.020    Blood, UA  negative    pH, UA 6.5    Protein, UA negative    Urobilinogen, UA 0.2    Nitrite, UA negative    Leukocytes, UA Trace (A) Negative  Basic metabolic panel     Status: Abnormal   Collection Time: 01/04/16  4:30 PM  Result Value Ref Range   Sodium 136 135 - 145 mmol/L   Potassium 3.7 3.5 - 5.1 mmol/L   Chloride 102 101 - 111 mmol/L   CO2 26 22 - 32 mmol/L   Glucose, Bld 104 (H) 65 - 99 mg/dL   BUN 14 6 - 20 mg/dL   Creatinine, Ser 0.64 0.44 - 1.00 mg/dL   Calcium 9.9 8.9 - 10.3 mg/dL   GFR calc non Af Amer >60 >60 mL/min   GFR calc Af Amer >60 >60 mL/min    Comment: (NOTE) The eGFR has been calculated using the CKD EPI equation. This calculation has not been validated in all clinical situations. eGFR's persistently <60 mL/min signify possible Chronic Kidney Disease.    Anion gap 8 5 - 15  Comp Met (CMET)     Status: None   Collection Time: 02/01/16 10:45 AM  Result Value Ref Range   Sodium 142 135 - 145 mEq/L   Potassium 4.2 3.5 - 5.1 mEq/L   Chloride 107 96 - 112 mEq/L   CO2 28 19 - 32 mEq/L   Glucose, Bld 83 70 - 99 mg/dL   BUN 11 6 - 23 mg/dL   Creatinine, Ser 0.60 0.40 - 1.20 mg/dL   Total Bilirubin  0.4 0.2 - 1.2 mg/dL   Alkaline Phosphatase 62 39 - 117 U/L   AST 20 0 - 37 U/L   ALT 21 0 - 35 U/L   Total Protein 6.2 6.0 - 8.3 g/dL   Albumin 4.2 3.5 - 5.2 g/dL   Calcium 9.3 8.4 - 10.5 mg/dL   GFR 108.82 >60.00 mL/min  Lipid panel     Status: None   Collection Time: 02/01/16 10:45 AM  Result Value Ref Range   Cholesterol 137 0 - 200 mg/dL    Comment: ATP III Classification       Desirable:  < 200 mg/dL               Borderline High:  200 - 239 mg/dL          High:  > = 240 mg/dL   Triglycerides 88.0 0.0 - 149.0 mg/dL    Comment: Normal:  <150 mg/dLBorderline High:  150 - 199 mg/dL   HDL 53.40 >39.00 mg/dL   VLDL 17.6 0.0 - 40.0 mg/dL   LDL Cholesterol 66 0 - 99 mg/dL   Total CHOL/HDL Ratio 3     Comment:                Men          Women1/2 Average Risk     3.4           3.3Average Risk          5.0          4.42X Average Risk          9.6          7.13X Average Risk          15.0          11.0                       NonHDL 83.75     Comment: NOTE:  Non-HDL goal should be 30 mg/dL higher than patient's LDL goal (i.e. LDL goal of < 70 mg/dL, would have non-HDL goal of < 100 mg/dL)  POCT Influenza A/B     Status: Normal   Collection Time: 02/09/16 11:43 AM  Result Value Ref Range   Influenza A, POC Negative Negative   Influenza B, POC Negative Negative  POCT Urinalysis Dipstick     Status: Normal   Collection Time: 02/10/16  1:50 PM  Result Value Ref Range   Color, UA yellow    Clarity, UA cloudy    Glucose, UA negative    Bilirubin, UA negative    Ketones, UA negative    Spec Grav, UA 1.010    Blood, UA negative    pH, UA 6.5    Protein, UA negative    Urobilinogen, UA negative    Nitrite, UA negative    Leukocytes, UA Negative Negative   Assessment/Plan: 1. Upper respiratory tract infection, unspecified type Suspect viral URI as diagnosed yesterday. Patient is on doxycycline for sinusitis. Continue as directed by treating provider. Some pleurisy secondary to inflammation from infection. Discussed pain relief and supportive measures. Continue Tramadol as directed for pain (patient unable to tolerate NSAIDs, Steroids or codeine) Vitals Stable. Well Criteria for PE with score of 0. Will check CXR today. Supportive measures reviewed. Tessalon for cough.  - DG Chest 2 View; Future  2. Urinary urgency Mild. Urine dip unremarkable. Supportive measures reviewed. Will alter regimen if indicated by Culture results.     -  POCT Urinalysis Dipstick - Urine Culture; Future - Urine Culture   Leeanne Rio, PA-C

## 2016-02-10 NOTE — Progress Notes (Signed)
Pre visit review using our clinic review tool, if applicable. No additional management support is needed unless otherwise documented below in the visit note. 

## 2016-02-10 NOTE — Patient Instructions (Signed)
Please go to the [facility] for your x-ray. I will call with your results.  Please continue the Doxycycline as directed. This will treat sinuses and most respiratory infections. I do think the overall constellation of your symptoms are related to a virus coupled with exacerbation of your fibromyalgia. Please take pain medication and other chronic medications as directed.  Make sure to restart Flonase. Use the Tessalon as directed for cough. Start a daily probiotic.   Your urine dip is negative.  We are sending for a culture and will call you with your results.  If anything acutely worsens, please go to the ER.

## 2016-02-11 ENCOUNTER — Telehealth: Payer: Self-pay | Admitting: Emergency Medicine

## 2016-02-11 ENCOUNTER — Other Ambulatory Visit: Payer: Self-pay | Admitting: Physician Assistant

## 2016-02-11 DIAGNOSIS — R112 Nausea with vomiting, unspecified: Secondary | ICD-10-CM

## 2016-02-11 DIAGNOSIS — R9389 Abnormal findings on diagnostic imaging of other specified body structures: Secondary | ICD-10-CM

## 2016-02-11 LAB — URINE CULTURE

## 2016-02-11 MED ORDER — ONDANSETRON HCL 8 MG PO TABS: 8.0000 mg | ORAL_TABLET | Freq: Three times a day (TID) | ORAL | 0 refills | Status: DC | PRN

## 2016-02-11 NOTE — Telephone Encounter (Signed)
Spoke with patient advised her to stay well hydrated, start the Zofran to help with nausea.  Strongly advised if unable to keep food or liquids down she will need to go to the ER for IV hydration. She will follow up on 02/17/16.

## 2016-02-11 NOTE — Telephone Encounter (Signed)
When advising patient of her chest xray results. Patient states she has been vomiting started 30 min ago. She denies a coughing spell prior to the vomiting. She states she vomited about 5-7 times, feeling nauseated, abdominal cramping.Denies fever or chills. Patient wanted something for the nausea. Please advise

## 2016-02-11 NOTE — Telephone Encounter (Signed)
Have her stay hydrated. We can send in Zofran (8 mg) for her to take for nausea. If she is unable to keep food/liquids down with the zofran and vomiting is not resolving, she needs to go to the ER as she will likely require IV hydration.   Have her follow-up with me Friday.

## 2016-02-12 ENCOUNTER — Other Ambulatory Visit: Payer: Self-pay | Admitting: Physician Assistant

## 2016-02-12 DIAGNOSIS — H905 Unspecified sensorineural hearing loss: Secondary | ICD-10-CM

## 2016-02-15 ENCOUNTER — Encounter: Payer: Self-pay | Admitting: Physician Assistant

## 2016-02-16 ENCOUNTER — Encounter: Payer: Self-pay | Admitting: General Practice

## 2016-02-17 ENCOUNTER — Ambulatory Visit (INDEPENDENT_AMBULATORY_CARE_PROVIDER_SITE_OTHER): Payer: 59 | Admitting: Physician Assistant

## 2016-02-17 ENCOUNTER — Encounter: Payer: Self-pay | Admitting: Physician Assistant

## 2016-02-17 VITALS — BP 140/82 | HR 72 | Temp 97.5°F | Resp 16 | Ht 63.0 in | Wt 152.0 lb

## 2016-02-17 DIAGNOSIS — K5732 Diverticulitis of large intestine without perforation or abscess without bleeding: Secondary | ICD-10-CM

## 2016-02-17 DIAGNOSIS — R102 Pelvic and perineal pain: Secondary | ICD-10-CM | POA: Diagnosis not present

## 2016-02-17 LAB — CBC WITH DIFFERENTIAL/PLATELET
BASOS PCT: 0.3 % (ref 0.0–3.0)
Basophils Absolute: 0 10*3/uL (ref 0.0–0.1)
EOS ABS: 0.1 10*3/uL (ref 0.0–0.7)
Eosinophils Relative: 0.9 % (ref 0.0–5.0)
HEMATOCRIT: 42.1 % (ref 36.0–46.0)
HEMOGLOBIN: 14.1 g/dL (ref 12.0–15.0)
LYMPHS PCT: 34.6 % (ref 12.0–46.0)
Lymphs Abs: 3.8 10*3/uL (ref 0.7–4.0)
MCHC: 33.5 g/dL (ref 30.0–36.0)
MCV: 84.8 fl (ref 78.0–100.0)
Monocytes Absolute: 0.6 10*3/uL (ref 0.1–1.0)
Monocytes Relative: 5 % (ref 3.0–12.0)
Neutro Abs: 6.6 10*3/uL (ref 1.4–7.7)
Neutrophils Relative %: 59.2 % (ref 43.0–77.0)
Platelets: 242 10*3/uL (ref 150.0–400.0)
RBC: 4.97 Mil/uL (ref 3.87–5.11)
RDW: 14.8 % (ref 11.5–15.5)
WBC: 11.1 10*3/uL — AB (ref 4.0–10.5)

## 2016-02-17 LAB — POCT URINALYSIS DIPSTICK
Bilirubin, UA: NEGATIVE
GLUCOSE UA: NEGATIVE
Ketones, UA: NEGATIVE
LEUKOCYTES UA: NEGATIVE
NITRITE UA: NEGATIVE
Protein, UA: NEGATIVE
UROBILINOGEN UA: 0.2
pH, UA: 7.5

## 2016-02-17 MED ORDER — AMOXICILLIN-POT CLAVULANATE 875-125 MG PO TABS: 1.0000 | ORAL_TABLET | Freq: Two times a day (BID) | ORAL | 0 refills | Status: DC

## 2016-02-17 NOTE — Patient Instructions (Signed)
Please take the Augmentin as directed with food. Continue goo dhydration and nausea medication. Pain medication as directed.  No anti-inflammatories. Please follow-up with GYN as scheduled for Ultrasound.  If there is any worsening symptoms, please go directly to the ER. Follow-up with [place] on Friday.

## 2016-02-17 NOTE — Progress Notes (Signed)
Pre visit review using our clinic review tool, if applicable. No additional management support is needed unless otherwise documented below in the visit note. 

## 2016-02-18 ENCOUNTER — Telehealth: Payer: Self-pay | Admitting: Physician Assistant

## 2016-02-18 NOTE — Telephone Encounter (Signed)
Pt left VM states that she was to get amoxicillin and that pharmacy gave her something different (could not understand what pt said in message) and asking that [place] call them to straighten this out, and then call her back.

## 2016-02-18 NOTE — Telephone Encounter (Signed)
Spoke with patient and she did look at the rx which does says amoxicillin. She is agreeable with taking medication

## 2016-02-20 NOTE — Progress Notes (Signed)
Patient presents to clinic today c/o LLQ pain over the past few days with anorexia, nausea. Was recently treated for bronchitis with doxycycline with resolution of symptoms.  Has history of sigmoid diverticulitis and states this feels similar. Denies fever, chills. Has been having pelvic cramping with suprapubic pressure. Also noted small amount of postmenopausal vaginal bleeding. Was seen by her GYN yesterday and is scheduled for [place]. Was told her bleeding was related to her recently stopping and restarting her [place] patch. Denies melena, hematochezia or tenesmus. Denies dysuria or hematuria.   Past Medical History:  Diagnosis Date  . Anxiety   . Asthma   . Chronic high back pain   . Double vision   . Fibromyalgia   . Heart murmur   . High cholesterol   . History of bronchitis    "last time ~ 2009, before I had pneumonia"  . Hyperparathyroidism ([place])   . Hypertension   . Hypoglycemia   . Kidney stone   . Knee pain, right   . Meniscus tear    Right  . Migraine    "used to have them often"  . Migraine 08/07/2015  . Pneumonia ~ 2009  . PTSD (post-traumatic stress disorder)   . Schizophrenia ([place])    treated by Dr. Casimiro Needle  . Seizure ([place]) 07/07/11  . Seizures ([place])    last seizure 3 years ago, not on AED  . Stroke ([place]) 07/08/11   "I've had mini strokes before; left side of face  is more down than right "  . Syncope and collapse    One episode - 07/10/15    Current Outpatient Prescriptions on File Prior to Visit  Medication Sig Dispense Refill  . ADVAIR HFA 115-21 MCG/ACT inhaler INHALE ONE PUFF BY MOUTH TWICE DAILY 12 g 5  . albuterol (PROVENTIL) (2.5 MG/3ML) 0.083% nebulizer solution Take 3 mLs (2.5 mg total) by nebulization every 6 (six) hours as needed for wheezing or shortness of breath. 75 mL 5  . cloNIDine (CATAPRES) 0.1 MG tablet Take 1 tablet by mouth if BP rises above 160/100 with migraine 30 tablet 0  . fluticasone (FLONASE) 50 MCG/ACT nasal spray USE TWO SPRAY(S) IN  EACH NOSTRIL ONCE DAILY 16 g 5  . gabapentin (NEURONTIN) 100 MG capsule TAKE ONE CAPSULE BY MOUTH ONCE DAILY IN THE MORNING, ONE CAPSULE AT NOON, AND THREE CAPSULES IN THE EVENING 150 capsule 3  . levalbuterol (XOPENEX HFA) 45 MCG/ACT inhaler INHALE TWO PUFFS EVERY 4 TO 6 HOURS AS NEEDED 15 g 5  . LORazepam (ATIVAN PO) Take 1 mg by mouth as needed (Takes 0.11m).     .Marland Kitchenlosartan (COZAAR) 25 MG tablet Take 1 tablet (25 mg total) by mouth 2 (two) times daily. 60 tablet 3  . meclizine (ANTIVERT) 25 MG tablet Take 1 tablet (25 mg total) by mouth 2 (two) times daily as needed for dizziness. 30 tablet 2  . metoprolol succinate (TOPROL-XL) 25 MG 24 hr tablet Take 3 tablets (75 mg total) by mouth daily. 270 tablet 1  . Multiple Vitamin (THERA) TABS Take by mouth.    . ondansetron (ZOFRAN) 8 MG tablet Take 1 tablet (8 mg total) by mouth every 8 (eight) hours as needed for nausea or vomiting. 20 tablet 0  . orphenadrine (NORFLEX) 100 MG tablet Take 1 tablet (100 mg total) by mouth 2 (two) times daily as needed for muscle spasms. 60 tablet 1  . promethazine (PHENERGAN) 12.5 MG tablet Take 1 tablet (12.5 mg total)  by mouth every 6 (six) hours as needed for nausea. Reported on 03/04/2015 20 tablet 0  . rosuvastatin (CRESTOR) 20 MG tablet TAKE ONE TABLET BY MOUTH ONCE DAILY IN THE EVENING 90 tablet 0  . temazepam (RESTORIL) 15 MG capsule     . traMADol (ULTRAM) 50 MG tablet Take 1 tablet (50 mg total) by mouth every 12 (twelve) hours as needed. 30 tablet 0  . zolpidem (AMBIEN) 10 MG tablet Take 20 mg by mouth at bedtime as needed.     No current facility-administered medications on file prior to visit.     Allergies  Allergen Reactions  . Sulfa Antibiotics Hives  . Metronidazole Nausea And Vomiting  . Acyclovir And Related   . Benadryl [Diphenhydramine Hcl]     Severe emotional reaction and doesn't work well with Pt. Past history  . Cephalosporins Hives  . Codeine Other (See Comments)    Makes patient feel  odd ; "sometimes mild; sometimes severe reaction; mostly severe"  . Cyclobenzaprine Other (See Comments)    Muscle Aches.  . Diclofenac Sodium Nausea And Vomiting  . Linzess [Linaclotide] Diarrhea  . Nitrofurantoin Monohyd Macro     Felt funny  . Prednisone Other (See Comments)    migraine  . Soma [Carisoprodol] Other (See Comments)    Cognitive Function, Daytime sleepiness  . Tizanidine     Other reaction(s): Other (See Comments) insomnia  . Baclofen Nausea Only  . Meloxicam     Other reaction(s): Confusion  . Sulfamethoxazole Rash    Family History  Problem Relation Age of Onset  . Heart attack Father 86    Living  . Arthritis Father   . Skin cancer Father   . Hypertension Father   . Hyperlipidemia Father   . Leukemia Mother 15    Deceased  . Alzheimer's disease Paternal Aunt     X2  . Stomach cancer Paternal Aunt     x1  . Arthritis/Rheumatoid Paternal Aunt     x1  . Neuropathy Brother     Peripheal  . Fibromyalgia Sister   . HIV Sister   . Arthritis/Rheumatoid Sister   . Diabetes Paternal Grandmother     Social History   Social History  . Marital status: Legally Separated    Spouse name: N/A  . Number of children: 2  . Years of education: 2 yrs coll   Occupational History  . Disabled    Social History Main Topics  . Smoking status: Former Smoker    Packs/day: 1.00    Years: 20.00    Types: Cigarettes    Quit date: 02/29/2004  . Smokeless tobacco: Never Used  . Alcohol use 0.0 oz/week     Comment: "glass of wine q once in awhile; not very often; do it on special occasion"  . Drug use: No  . Sexual activity: No   Other Topics Concern  . None   Social History Narrative   Completed 2 years of college.    On disability.   Lives at home with her husband.   No caffeine use.   Left-handed.   Three children, 2 biological - 1 adopted.    Review of Systems - See HPI.  All other ROS are negative.  BP 140/82   Pulse 72   Temp 97.5 F (36.4 C)  (Oral)   Resp 16   Ht 5' 3"  (1.6 m)   Wt 152 lb (68.9 kg)   SpO2 98%   BMI 26.93 kg/m  Physical Exam  Constitutional: She is oriented to person, place, and time and well-developed, well-nourished, and in no distress.  HENT:  Head: Normocephalic and atraumatic.  Eyes: Conjunctivae are normal.  Neck: Neck supple.  Cardiovascular: Normal rate, regular rhythm, normal heart sounds and intact distal pulses.   Pulmonary/Chest: Effort normal. No respiratory distress. She has no wheezes. She has no rales. She exhibits no tenderness.  Abdominal: Soft. Bowel sounds are normal. She exhibits no distension. There is tenderness in the left upper quadrant and left lower quadrant. There is no rebound and no guarding.  Neurological: She is alert and oriented to person, place, and time.  Skin: Skin is warm and dry. No rash noted.  Psychiatric: Affect normal.  Vitals reviewed.    Recent Results (from the past 2160 hour(s))  CULTURE, URINE COMPREHENSIVE     Status: None   Collection Time: 12/28/15 12:32 PM  Result Value Ref Range   Colony Count 50,000-100,000 CFU/mL    Organism ID, Bacteria CORYNEBACTERIUM SPECIES   TSH     Status: None   Collection Time: 12/28/15 12:36 PM  Result Value Ref Range   TSH 1.69 0.35 - 4.50 uIU/mL  POCT urinalysis dipstick     Status: Abnormal   Collection Time: 12/28/15 12:45 PM  Result Value Ref Range   Color, UA straw    Clarity, UA clear    Glucose, UA neg    Bilirubin, UA neg    Ketones, UA neg    Spec Grav, UA 1.020    Blood, UA negative    pH, UA 6.5    Protein, UA negative    Urobilinogen, UA 0.2    Nitrite, UA negative    Leukocytes, UA Trace (A) Negative  Basic metabolic panel     Status: Abnormal   Collection Time: 01/04/16  4:30 PM  Result Value Ref Range   Sodium 136 135 - 145 mmol/L   Potassium 3.7 3.5 - 5.1 mmol/L   Chloride 102 101 - 111 mmol/L   CO2 26 22 - 32 mmol/L   Glucose, Bld 104 (H) 65 - 99 mg/dL   BUN 14 6 - 20 mg/dL    Creatinine, Ser 0.64 0.44 - 1.00 mg/dL   Calcium 9.9 8.9 - 10.3 mg/dL   GFR calc non Af Amer >60 >60 mL/min   GFR calc Af Amer >60 >60 mL/min    Comment: (NOTE) The eGFR has been calculated using the CKD EPI equation. This calculation has not been validated in all clinical situations. eGFR's persistently <60 mL/min signify possible Chronic Kidney Disease.    Anion gap 8 5 - 15  Comp Met (CMET)     Status: None   Collection Time: 02/01/16 10:45 AM  Result Value Ref Range   Sodium 142 135 - 145 mEq/L   Potassium 4.2 3.5 - 5.1 mEq/L   Chloride 107 96 - 112 mEq/L   CO2 28 19 - 32 mEq/L   Glucose, Bld 83 70 - 99 mg/dL   BUN 11 6 - 23 mg/dL   Creatinine, Ser 0.60 0.40 - 1.20 mg/dL   Total Bilirubin 0.4 0.2 - 1.2 mg/dL   Alkaline Phosphatase 62 39 - 117 U/L   AST 20 0 - 37 U/L   ALT 21 0 - 35 U/L   Total Protein 6.2 6.0 - 8.3 g/dL   Albumin 4.2 3.5 - 5.2 g/dL   Calcium 9.3 8.4 - 10.5 mg/dL   GFR 108.82 >60.00 mL/min  Lipid panel  Status: None   Collection Time: 02/01/16 10:45 AM  Result Value Ref Range   Cholesterol 137 0 - 200 mg/dL    Comment: ATP III Classification       Desirable:  < 200 mg/dL               Borderline High:  200 - 239 mg/dL          High:  > = 240 mg/dL   Triglycerides 88.0 0.0 - 149.0 mg/dL    Comment: Normal:  <150 mg/dLBorderline High:  150 - 199 mg/dL   HDL 53.40 >39.00 mg/dL   VLDL 17.6 0.0 - 40.0 mg/dL   LDL Cholesterol 66 0 - 99 mg/dL   Total CHOL/HDL Ratio 3     Comment:                Men          Women1/2 Average Risk     3.4          3.3Average Risk          5.0          4.42X Average Risk          9.6          7.13X Average Risk          15.0          11.0                       NonHDL 83.75     Comment: NOTE:  Non-HDL goal should be 30 mg/dL higher than patient's LDL goal (i.e. LDL goal of < 70 mg/dL, would have non-HDL goal of < 100 mg/dL)  POCT Influenza A/B     Status: Normal   Collection Time: 02/09/16 11:43 AM  Result Value Ref Range    Influenza A, POC Negative Negative   Influenza B, POC Negative Negative  POCT Urinalysis Dipstick     Status: Normal   Collection Time: 02/10/16  1:50 PM  Result Value Ref Range   Color, UA yellow    Clarity, UA cloudy    Glucose, UA negative    Bilirubin, UA negative    Ketones, UA negative    Spec Grav, UA 1.010    Blood, UA negative    pH, UA 6.5    Protein, UA negative    Urobilinogen, UA negative    Nitrite, UA negative    Leukocytes, UA Negative Negative  Urine Culture     Status: None   Collection Time: 02/10/16  1:58 PM  Result Value Ref Range   Organism ID, Bacteria      Three or more organisms present,each greater than 10,000 CFU/mL.These organisms,commonly found on external and internal genitalia,are considered to be colonizers.No further testing performed.   CBC w/Diff     Status: Abnormal   Collection Time: 02/17/16  2:27 PM  Result Value Ref Range   WBC 11.1 (H) 4.0 - 10.5 K/uL   RBC 4.97 3.87 - 5.11 Mil/uL   Hemoglobin 14.1 12.0 - 15.0 g/dL   HCT 42.1 36.0 - 46.0 %   MCV 84.8 78.0 - 100.0 fl   MCHC 33.5 30.0 - 36.0 g/dL   RDW 14.8 11.5 - 15.5 %   Platelets 242.0 150.0 - 400.0 K/uL   Neutrophils Relative % 59.2 43.0 - 77.0 %   Lymphocytes Relative 34.6 12.0 - 46.0 %   Monocytes Relative 5.0 3.0 - 12.0 %  Eosinophils Relative 0.9 0.0 - 5.0 %   Basophils Relative 0.3 0.0 - 3.0 %   Neutro Abs 6.6 1.4 - 7.7 K/uL   Lymphs Abs 3.8 0.7 - 4.0 K/uL   Monocytes Absolute 0.6 0.1 - 1.0 K/uL   Eosinophils Absolute 0.1 0.0 - 0.7 K/uL   Basophils Absolute 0.0 0.0 - 0.1 K/uL  POCT Urinalysis Dipstick     Status: Abnormal   Collection Time: 02/17/16  3:38 PM  Result Value Ref Range   Color, UA straw    Clarity, UA clear    Glucose, UA negative    Bilirubin, UA negative    Ketones, UA negative    Spec Grav, UA <=1.005    Blood, UA trace    pH, UA 7.5    Protein, UA negative    Urobilinogen, UA 0.2    Nitrite, UA negative    Leukocytes, UA Negative Negative     Assessment/Plan: 1. Diverticulitis of colon + LLQ pain in patient with history of sigmoid and descending colon diverticuli. Patient allergic to Flagyl. Will Rx Augmenting. Supportive measures and OTC medications reviewed. Will check CBC today. Alarm signs/symptoms prompting ER assessment reviewed with patient.  - amoxicillin-clavulanate (AUGMENTIN) 875-125 MG tablet; Take 1 tablet by mouth 2 (two) times daily.  Dispense: 20 tablet; Refill: 0 - CBC w/Diff  2. Pelvic pressure in female With postmenopausal bleeding. Urine dip with blood but no other abnormal findings. Has [place] scheduled with [facility]. She is to have as scheduled and follow-up with their care.  - POCT Urinalysis Dipstick   Leeanne Rio, PA-C

## 2016-03-02 DIAGNOSIS — M25561 Pain in right knee: Secondary | ICD-10-CM | POA: Diagnosis not present

## 2016-03-02 DIAGNOSIS — M179 Osteoarthritis of knee, unspecified: Secondary | ICD-10-CM | POA: Diagnosis not present

## 2016-03-03 ENCOUNTER — Other Ambulatory Visit: Payer: Self-pay | Admitting: Physician Assistant

## 2016-03-05 DIAGNOSIS — R1084 Generalized abdominal pain: Secondary | ICD-10-CM | POA: Diagnosis not present

## 2016-03-05 DIAGNOSIS — M542 Cervicalgia: Secondary | ICD-10-CM | POA: Diagnosis not present

## 2016-03-05 DIAGNOSIS — R52 Pain, unspecified: Secondary | ICD-10-CM | POA: Diagnosis not present

## 2016-03-05 DIAGNOSIS — R5383 Other fatigue: Secondary | ICD-10-CM | POA: Diagnosis not present

## 2016-03-07 ENCOUNTER — Telehealth: Payer: Self-pay | Admitting: Physician Assistant

## 2016-03-07 NOTE — Telephone Encounter (Signed)
-----   Message from Brunetta Jeans, PA-C sent at 02/11/2016 11:03 AM EST ----- Repeat CXR - assess for nodule

## 2016-03-07 NOTE — Telephone Encounter (Signed)
Patient due for repeat CXR to assess for lung nodules.  Order already placed. Recommend she have done at earliest convenience.

## 2016-03-08 NOTE — Telephone Encounter (Signed)
Advised patient she is due for repeat CXR. Patient is agreeable and will go get CXR done

## 2016-03-09 DIAGNOSIS — N951 Menopausal and female climacteric states: Secondary | ICD-10-CM | POA: Diagnosis not present

## 2016-03-09 DIAGNOSIS — N950 Postmenopausal bleeding: Secondary | ICD-10-CM | POA: Diagnosis not present

## 2016-03-11 ENCOUNTER — Telehealth: Payer: Self-pay | Admitting: Physician Assistant

## 2016-03-11 DIAGNOSIS — M179 Osteoarthritis of knee, unspecified: Secondary | ICD-10-CM | POA: Diagnosis not present

## 2016-03-11 DIAGNOSIS — M25561 Pain in right knee: Secondary | ICD-10-CM | POA: Diagnosis not present

## 2016-03-11 NOTE — Telephone Encounter (Signed)
LMOVM for return call.  

## 2016-03-11 NOTE — Telephone Encounter (Signed)
(  Voicemail message received on phone at front desk on 03/11/16 at 4:08pm).  Patient left voicemail message requesting return call from [facility], not his nurse.  Patient states she needs to speak with [facility] personally about something.  Tel# [phone].

## 2016-03-14 DIAGNOSIS — M25561 Pain in right knee: Secondary | ICD-10-CM | POA: Diagnosis not present

## 2016-03-14 DIAGNOSIS — M179 Osteoarthritis of knee, unspecified: Secondary | ICD-10-CM | POA: Diagnosis not present

## 2016-03-15 ENCOUNTER — Telehealth: Payer: Self-pay | Admitting: Emergency Medicine

## 2016-03-15 ENCOUNTER — Other Ambulatory Visit: Payer: Self-pay | Admitting: Emergency Medicine

## 2016-03-15 MED ORDER — ORPHENADRINE CITRATE ER 100 MG PO TB12: 100.0000 mg | ORAL_TABLET | Freq: Two times a day (BID) | ORAL | 0 refills | Status: DC | PRN

## 2016-03-15 NOTE — Telephone Encounter (Signed)
Received fax from [facility] for medication clarification. Patient requesting refill of the Norflex  Mg bid prn Spoke with pharmacist and states that patient has been getting #43 pills on 01/25/16 and #43 on 02/16/16. Unsure why she is not filling the #60.  Please advise

## 2016-03-15 NOTE — Telephone Encounter (Signed)
Sent in #60 of the Norflex for patient

## 2016-03-15 NOTE — Telephone Encounter (Signed)
Called patient again and but her mailbox is full unable to leave a message.

## 2016-03-15 NOTE — Telephone Encounter (Signed)
Ok to refill same quantity with 0 refills.

## 2016-03-24 DIAGNOSIS — M25561 Pain in right knee: Secondary | ICD-10-CM | POA: Diagnosis not present

## 2016-03-24 DIAGNOSIS — M179 Osteoarthritis of knee, unspecified: Secondary | ICD-10-CM | POA: Diagnosis not present

## 2016-03-28 DIAGNOSIS — H903 Sensorineural hearing loss, bilateral: Secondary | ICD-10-CM | POA: Diagnosis not present

## 2016-03-29 ENCOUNTER — Telehealth: Payer: Self-pay | Admitting: Physician Assistant

## 2016-03-29 NOTE — Telephone Encounter (Signed)
Pt left message on VM stating that she is losing her hair and wants a call back.

## 2016-03-31 ENCOUNTER — Encounter: Payer: Self-pay | Admitting: Physician Assistant

## 2016-03-31 ENCOUNTER — Ambulatory Visit (HOSPITAL_BASED_OUTPATIENT_CLINIC_OR_DEPARTMENT_OTHER)
Admission: RE | Admit: 2016-03-31 | Discharge: 2016-03-31 | Disposition: A | Discharge status: Home / Self Care | Payer: 59 | Source: Ambulatory Visit | Attending: Physician Assistant | Admitting: Physician Assistant

## 2016-03-31 ENCOUNTER — Ambulatory Visit (INDEPENDENT_AMBULATORY_CARE_PROVIDER_SITE_OTHER): Payer: 59 | Admitting: Physician Assistant

## 2016-03-31 VITALS — BP 120/70 | HR 57 | Temp 97.9°F | Resp 14 | Ht 63.0 in | Wt 159.0 lb

## 2016-03-31 DIAGNOSIS — R202 Paresthesia of skin: Secondary | ICD-10-CM | POA: Diagnosis not present

## 2016-03-31 DIAGNOSIS — R938 Abnormal findings on diagnostic imaging of other specified body structures: Secondary | ICD-10-CM | POA: Insufficient documentation

## 2016-03-31 DIAGNOSIS — M542 Cervicalgia: Secondary | ICD-10-CM | POA: Diagnosis not present

## 2016-03-31 DIAGNOSIS — M47812 Spondylosis without myelopathy or radiculopathy, cervical region: Secondary | ICD-10-CM | POA: Diagnosis not present

## 2016-03-31 DIAGNOSIS — M179 Osteoarthritis of knee, unspecified: Secondary | ICD-10-CM | POA: Diagnosis not present

## 2016-03-31 DIAGNOSIS — M25512 Pain in left shoulder: Secondary | ICD-10-CM

## 2016-03-31 DIAGNOSIS — G8929 Other chronic pain: Secondary | ICD-10-CM

## 2016-03-31 DIAGNOSIS — R9389 Abnormal findings on diagnostic imaging of other specified body structures: Secondary | ICD-10-CM

## 2016-03-31 DIAGNOSIS — R200 Anesthesia of skin: Secondary | ICD-10-CM

## 2016-03-31 DIAGNOSIS — M25561 Pain in right knee: Secondary | ICD-10-CM | POA: Diagnosis not present

## 2016-03-31 DIAGNOSIS — R079 Chest pain, unspecified: Secondary | ICD-10-CM | POA: Diagnosis not present

## 2016-03-31 DIAGNOSIS — R0789 Other chest pain: Secondary | ICD-10-CM

## 2016-03-31 LAB — VITAMIN B12: Vitamin B-12: 773 pg/mL (ref 211–911)

## 2016-03-31 MED ORDER — AZELASTINE HCL 0.1 % NA SOLN: 1.0000 | Freq: Two times a day (BID) | NASAL | 12 refills | Status: DC

## 2016-03-31 NOTE — Progress Notes (Signed)
Pre visit review using our clinic review tool, if applicable. No additional management support is needed unless otherwise documented below in the visit note. 

## 2016-03-31 NOTE — Progress Notes (Signed)
Patient presents to clinic today c/o pain in left shoulder x a couple of weeks. Pain is described as sharp. Endorses radiation down into her hand with some left hand tingling. Denies overt chest pain but notes of "left pectoral pain occasionally". Denies palpitations, SOB, headaches, dizziness or lightheadedness. Pain is exacerbated with ROM of L shoulder. Denies trauma or injury to the area.  Was recently seen at [facility] for similar symptoms on 04/21/16. CXR obtained at that visit and negative. Was started on Naproxen which she states she has not taken.  Patient also endorses intermittent tingling of her lower extremities, mainly at night. States is mild but is concerned may be related to her b12 level. Is followed by Neurology.   Past Medical History:  Diagnosis Date  . Anxiety   . Asthma   . Chronic high back pain   . Double vision   . Fibromyalgia   . Heart murmur   . High cholesterol   . Hyperparathyroidism ([place])   . Hypertension   . Hypoglycemia   . Kidney stone   . Meniscus tear    Right  . Migraine 08/07/2015  . PTSD (post-traumatic stress disorder)   . Schizophrenia ([place])    treated by Dr. Casimiro Needle  . Seizures ([place])    last seizure 3 years ago, not on AED  . Stroke ([place]) 07/08/11   "I've had mini strokes before; left side of face  is more down than right "  . Syncope and collapse    One episode - 07/10/15    Current Outpatient Prescriptions on File Prior to Visit  Medication Sig Dispense Refill  . ADVAIR HFA 115-21 MCG/ACT inhaler INHALE ONE PUFF BY MOUTH TWICE DAILY 12 g 5  . albuterol (PROVENTIL) (2.5 MG/3ML) 0.083% nebulizer solution Take 3 mLs (2.5 mg total) by nebulization every 6 (six) hours as needed for wheezing or shortness of breath. 75 mL 5  . fluticasone (FLONASE) 50 MCG/ACT nasal spray USE TWO SPRAY(S) IN EACH NOSTRIL ONCE DAILY 16 g 5  . gabapentin (NEURONTIN) 100 MG capsule TAKE ONE CAPSULE BY MOUTH ONCE DAILY IN THE MORNING, ONE CAPSULE AT NOON, AND THREE  CAPSULES IN THE EVENING 150 capsule 3  . levalbuterol (XOPENEX HFA) 45 MCG/ACT inhaler INHALE TWO PUFFS EVERY 4 TO 6 HOURS AS NEEDED 15 g 5  . losartan (COZAAR) 25 MG tablet Take 1 tablet (25 mg total) by mouth 2 (two) times daily. 60 tablet 3  . metoprolol succinate (TOPROL-XL) 25 MG 24 hr tablet TAKE THREE TABLETS BY MOUTH ONCE DAILY 270 tablet 1  . Multiple Vitamin (THERA) TABS Take by mouth.    . orphenadrine (NORFLEX) 100 MG tablet Take 1 tablet (100 mg total) by mouth 2 (two) times daily as needed for muscle spasms. 60 tablet 0  . promethazine (PHENERGAN) 12.5 MG tablet Take 1 tablet (12.5 mg total) by mouth every 6 (six) hours as needed for nausea. Reported on 03/04/2015 20 tablet 0  . rosuvastatin (CRESTOR) 20 MG tablet TAKE ONE TABLET BY MOUTH ONCE DAILY IN THE EVENING 90 tablet 0  . temazepam (RESTORIL) 15 MG capsule     . traMADol (ULTRAM) 50 MG tablet Take 1 tablet (50 mg total) by mouth every 12 (twelve) hours as needed. 30 tablet 0  . zolpidem (AMBIEN) 10 MG tablet Take 20 mg by mouth at bedtime as needed.     No current facility-administered medications on file prior to visit.     Allergies  Allergen  Reactions  . Sulfa Antibiotics Hives  . Metronidazole Nausea And Vomiting  . Acyclovir And Related   . Benadryl [Diphenhydramine Hcl]     Severe emotional reaction and doesn't work well with Pt. Past history  . Cephalosporins Hives  . Codeine Other (See Comments)    Makes patient feel odd ; "sometimes mild; sometimes severe reaction; mostly severe"  . Cyclobenzaprine Other (See Comments)    Muscle Aches.  . Diclofenac Sodium Nausea And Vomiting  . Linzess [Linaclotide] Diarrhea  . Nitrofurantoin Monohyd Macro     Felt funny  . Prednisone Other (See Comments)    migraine  . Soma [Carisoprodol] Other (See Comments)    Cognitive Function, Daytime sleepiness  . Tizanidine     Other reaction(s): Other (See Comments) insomnia  . Baclofen Nausea Only  . Meloxicam     Other  reaction(s): Confusion  . Sulfamethoxazole Rash    Family History  Problem Relation Age of Onset  . Heart attack Father 63    Living  . Arthritis Father   . Skin cancer Father   . Hypertension Father   . Hyperlipidemia Father   . Leukemia Mother 102    Deceased  . Alzheimer's disease Paternal Aunt     X2  . Stomach cancer Paternal Aunt     x1  . Arthritis/Rheumatoid Paternal Aunt     x1  . Neuropathy Brother     Peripheal  . Fibromyalgia Sister   . HIV Sister   . Arthritis/Rheumatoid Sister   . Diabetes Paternal Grandmother     Social History   Social History  . Marital status: Legally Separated    Spouse name: N/A  . Number of children: 2  . Years of education: 2 yrs coll   Occupational History  . Disabled    Social History Main Topics  . Smoking status: Former Smoker    Packs/day: 1.00    Years: 20.00    Types: Cigarettes    Quit date: 02/29/2004  . Smokeless tobacco: Never Used  . Alcohol use 0.0 oz/week     Comment: "glass of wine q once in awhile; not very often; do it on special occasion"  . Drug use: No  . Sexual activity: No   Other Topics Concern  . None   Social History Narrative   Completed 2 years of college.    On disability.   Lives at home with her husband.   No caffeine use.   Left-handed.   Three children, 2 biological - 1 adopted.    Review of Systems - See HPI.  All other ROS are negative.  BP 120/70   Pulse (!) 57   Temp 97.9 F (36.6 C) (Oral)   Resp 14   Ht '5\' 3"'$  (1.6 m)   Wt 159 lb (72.1 kg)   SpO2 98%   BMI 28.17 kg/m   Physical Exam  Constitutional: She is oriented to person, place, and time and well-developed, well-nourished, and in no distress.  HENT:  Head: Normocephalic and atraumatic.  Eyes: Conjunctivae and EOM are normal. Pupils are equal, round, and reactive to light.  Neck: Neck supple.  Cardiovascular: Normal rate, regular rhythm, normal heart sounds and intact distal pulses.   Pulmonary/Chest: Effort  normal and breath sounds normal. No respiratory distress. She has no wheezes. She has no rales. She exhibits no tenderness.  Musculoskeletal:       Right shoulder: Normal.       Left shoulder: She exhibits decreased  range of motion, tenderness and decreased strength. She exhibits no bony tenderness and no swelling.       Left elbow: Normal.       Left wrist: Normal.       Cervical back: She exhibits tenderness and pain. She exhibits normal range of motion and no bony tenderness.  Pain of L shoulder with external and internal rotation. Some pain with abduction of L arm. Strength 4/5 or L upper extremity.   Neurological: She is alert and oriented to person, place, and time. She has normal reflexes. She displays facial symmetry. Coordination and gait normal. GCS score is 15.  Monofilament testing performed of feet without abnormal findings. Sensory testing of upper extremities within normal limits.    Skin: Skin is warm and dry. No rash noted.  Psychiatric: Affect normal.  Vitals reviewed.   Recent Results (from the past 2160 hour(s))  Basic metabolic panel     Status: Abnormal   Collection Time: 01/04/16  4:30 PM  Result Value Ref Range   Sodium 136 135 - 145 mmol/L   Potassium 3.7 3.5 - 5.1 mmol/L   Chloride 102 101 - 111 mmol/L   CO2 26 22 - 32 mmol/L   Glucose, Bld 104 (H) 65 - 99 mg/dL   BUN 14 6 - 20 mg/dL   Creatinine, Ser 0.64 0.44 - 1.00 mg/dL   Calcium 9.9 8.9 - 10.3 mg/dL   GFR calc non Af Amer >60 >60 mL/min   GFR calc Af Amer >60 >60 mL/min    Comment: (NOTE) The eGFR has been calculated using the CKD EPI equation. This calculation has not been validated in all clinical situations. eGFR's persistently <60 mL/min signify possible Chronic Kidney Disease.    Anion gap 8 5 - 15  Comp Met (CMET)     Status: None   Collection Time: 02/01/16 10:45 AM  Result Value Ref Range   Sodium 142 135 - 145 mEq/L   Potassium 4.2 3.5 - 5.1 mEq/L   Chloride 107 96 - 112 mEq/L   CO2  28 19 - 32 mEq/L   Glucose, Bld 83 70 - 99 mg/dL   BUN 11 6 - 23 mg/dL   Creatinine, Ser 0.60 0.40 - 1.20 mg/dL   Total Bilirubin 0.4 0.2 - 1.2 mg/dL   Alkaline Phosphatase 62 39 - 117 U/L   AST 20 0 - 37 U/L   ALT 21 0 - 35 U/L   Total Protein 6.2 6.0 - 8.3 g/dL   Albumin 4.2 3.5 - 5.2 g/dL   Calcium 9.3 8.4 - 10.5 mg/dL   GFR 108.82 >60.00 mL/min  Lipid panel     Status: None   Collection Time: 02/01/16 10:45 AM  Result Value Ref Range   Cholesterol 137 0 - 200 mg/dL    Comment: ATP III Classification       Desirable:  < 200 mg/dL               Borderline High:  200 - 239 mg/dL          High:  > = 240 mg/dL   Triglycerides 88.0 0.0 - 149.0 mg/dL    Comment: Normal:  <150 mg/dLBorderline High:  150 - 199 mg/dL   HDL 53.40 >39.00 mg/dL   VLDL 17.6 0.0 - 40.0 mg/dL   LDL Cholesterol 66 0 - 99 mg/dL   Total CHOL/HDL Ratio 3     Comment:  Men          Women1/2 Average Risk     3.4          3.3Average Risk          5.0          4.42X Average Risk          9.6          7.13X Average Risk          15.0          11.0                       NonHDL 83.75     Comment: NOTE:  Non-HDL goal should be 30 mg/dL higher than patient's LDL goal (i.e. LDL goal of < 70 mg/dL, would have non-HDL goal of < 100 mg/dL)  POCT Influenza A/B     Status: Normal   Collection Time: 02/09/16 11:43 AM  Result Value Ref Range   Influenza A, POC Negative Negative   Influenza B, POC Negative Negative  POCT Urinalysis Dipstick     Status: Normal   Collection Time: 02/10/16  1:50 PM  Result Value Ref Range   Color, UA yellow    Clarity, UA cloudy    Glucose, UA negative    Bilirubin, UA negative    Ketones, UA negative    Spec Grav, UA 1.010    Blood, UA negative    pH, UA 6.5    Protein, UA negative    Urobilinogen, UA negative    Nitrite, UA negative    Leukocytes, UA Negative Negative  Urine Culture     Status: None   Collection Time: 02/10/16  1:58 PM  Result Value Ref Range   Organism  ID, Bacteria      Three or more organisms present,each greater than 10,000 CFU/mL.These organisms,commonly found on external and internal genitalia,are considered to be colonizers.No further testing performed.   CBC w/Diff     Status: Abnormal   Collection Time: 02/17/16  2:27 PM  Result Value Ref Range   WBC 11.1 (H) 4.0 - 10.5 K/uL   RBC 4.97 3.87 - 5.11 Mil/uL   Hemoglobin 14.1 12.0 - 15.0 g/dL   HCT 42.1 36.0 - 46.0 %   MCV 84.8 78.0 - 100.0 fl   MCHC 33.5 30.0 - 36.0 g/dL   RDW 14.8 11.5 - 15.5 %   Platelets 242.0 150.0 - 400.0 K/uL   Neutrophils Relative % 59.2 43.0 - 77.0 %   Lymphocytes Relative 34.6 12.0 - 46.0 %   Monocytes Relative 5.0 3.0 - 12.0 %   Eosinophils Relative 0.9 0.0 - 5.0 %   Basophils Relative 0.3 0.0 - 3.0 %   Neutro Abs 6.6 1.4 - 7.7 K/uL   Lymphs Abs 3.8 0.7 - 4.0 K/uL   Monocytes Absolute 0.6 0.1 - 1.0 K/uL   Eosinophils Absolute 0.1 0.0 - 0.7 K/uL   Basophils Absolute 0.0 0.0 - 0.1 K/uL  POCT Urinalysis Dipstick     Status: Abnormal   Collection Time: 02/17/16  3:38 PM  Result Value Ref Range   Color, UA straw    Clarity, UA clear    Glucose, UA negative    Bilirubin, UA negative    Ketones, UA negative    Spec Grav, UA <=1.005    Blood, UA trace    pH, UA 7.5    Protein, UA negative    Urobilinogen, UA 0.2  Nitrite, UA negative    Leukocytes, UA Negative Negative    Assessment/Plan: 1. Atypical chest pain Symptoms/exam consistent with MSK etiology. EKG was obtained -- NSR unchanged from prior rhythms. Pectoral strain and rotator cuff tendinopathy. See treatment below. - EKG 12-Lead  2. Numbness and tingling of both lower extremities b12 level today. - B12  3. Chronic left shoulder pain Suspect impingement versus other rotator cuff tendinopathy. Imaging today of cervical spine and left shoulder. Discussed need for Orthopedics. Discussed pain management with patient. Will alter based on imaging results. - DG Shoulder Left;  Future - DG Cervical Spine Complete; Future   Leeanne Rio, PA-C

## 2016-03-31 NOTE — Patient Instructions (Signed)
Please go to the lab for blood work. I will call you with your results.  Go to the [facility] for your x-ray. I will call you with your results. We will likely need to get an Orthopedist on board for further assessment and treatment. Avoid heavy lifting.  Please continue chronic medications as directed. Adding on a 81 mg Aspirin daily.   I am setting you up with a new Neurologist. Giving your history you should be followed by a specialist.   Please start a B complex multivitamin daily.   We will alter your regimen based on results.

## 2016-03-31 NOTE — Telephone Encounter (Signed)
Patient scheduled and appointment to discuss

## 2016-04-01 ENCOUNTER — Other Ambulatory Visit: Payer: Self-pay | Admitting: Physician Assistant

## 2016-04-01 DIAGNOSIS — R200 Anesthesia of skin: Secondary | ICD-10-CM

## 2016-04-01 DIAGNOSIS — R202 Paresthesia of skin: Principal | ICD-10-CM

## 2016-04-01 DIAGNOSIS — G8929 Other chronic pain: Secondary | ICD-10-CM

## 2016-04-01 DIAGNOSIS — M25512 Pain in left shoulder: Secondary | ICD-10-CM

## 2016-04-06 ENCOUNTER — Other Ambulatory Visit: Payer: Self-pay | Admitting: Physician Assistant

## 2016-04-07 DIAGNOSIS — M25561 Pain in right knee: Secondary | ICD-10-CM | POA: Diagnosis not present

## 2016-04-07 DIAGNOSIS — M179 Osteoarthritis of knee, unspecified: Secondary | ICD-10-CM | POA: Diagnosis not present

## 2016-04-08 DIAGNOSIS — M25512 Pain in left shoulder: Secondary | ICD-10-CM | POA: Diagnosis not present

## 2016-04-08 DIAGNOSIS — M1711 Unilateral primary osteoarthritis, right knee: Secondary | ICD-10-CM | POA: Diagnosis not present

## 2016-04-08 DIAGNOSIS — M7542 Impingement syndrome of left shoulder: Secondary | ICD-10-CM | POA: Diagnosis not present

## 2016-04-14 DIAGNOSIS — M25561 Pain in right knee: Secondary | ICD-10-CM | POA: Diagnosis not present

## 2016-04-14 DIAGNOSIS — M25512 Pain in left shoulder: Secondary | ICD-10-CM | POA: Diagnosis not present

## 2016-04-14 DIAGNOSIS — M179 Osteoarthritis of knee, unspecified: Secondary | ICD-10-CM | POA: Diagnosis not present

## 2016-04-18 DIAGNOSIS — M179 Osteoarthritis of knee, unspecified: Secondary | ICD-10-CM | POA: Diagnosis not present

## 2016-04-18 DIAGNOSIS — M25561 Pain in right knee: Secondary | ICD-10-CM | POA: Diagnosis not present

## 2016-04-18 DIAGNOSIS — M25512 Pain in left shoulder: Secondary | ICD-10-CM | POA: Diagnosis not present

## 2016-04-20 ENCOUNTER — Ambulatory Visit (INDEPENDENT_AMBULATORY_CARE_PROVIDER_SITE_OTHER): Payer: 59 | Admitting: Physician Assistant

## 2016-04-20 ENCOUNTER — Encounter: Payer: Self-pay | Admitting: Physician Assistant

## 2016-04-20 VITALS — BP 122/78 | HR 58 | Temp 98.1°F | Resp 14 | Ht 63.0 in | Wt 163.0 lb

## 2016-04-20 DIAGNOSIS — J0100 Acute maxillary sinusitis, unspecified: Secondary | ICD-10-CM | POA: Diagnosis not present

## 2016-04-20 DIAGNOSIS — M26622 Arthralgia of left temporomandibular joint: Secondary | ICD-10-CM

## 2016-04-20 DIAGNOSIS — H6983 Other specified disorders of Eustachian tube, bilateral: Secondary | ICD-10-CM

## 2016-04-20 MED ORDER — DOXYCYCLINE HYCLATE 100 MG PO TABS: 100.0000 mg | ORAL_TABLET | Freq: Two times a day (BID) | ORAL | 0 refills | Status: DC

## 2016-04-20 NOTE — Progress Notes (Signed)
Patient presents to clinic today c/o 3 days of bilateral ear pressure with occasional pain of L ear and anterior to ear. Patient endorses popping of the ears with nasal congestion. Denies fever, or chills but notes getting a bit sweaty at night when her nose is stopped up. Also notes grinding teeth at night, to the extent of chipping one of her bottom incisors last week. Denies any tooth pain, sinus pain.   Past Medical History:  Diagnosis Date  . Anxiety   . Asthma   . Chronic high back pain   . Double vision   . Fibromyalgia   . Heart murmur   . High cholesterol   . Hyperparathyroidism ([place])   . Hypertension   . Hypoglycemia   . Kidney stone   . Meniscus tear    Right  . Migraine 08/07/2015  . PTSD (post-traumatic stress disorder)   . Schizophrenia ([place])    treated by Dr. Casimiro Needle  . Seizures ([place])    last seizure 3 years ago, not on AED  . Stroke ([place]) 07/08/11   "I've had mini strokes before; left side of face  is more down than right "  . Syncope and collapse    One episode - 07/10/15    Current Outpatient Prescriptions on File Prior to Visit  Medication Sig Dispense Refill  . ADVAIR HFA 115-21 MCG/ACT inhaler INHALE ONE PUFF BY MOUTH TWICE DAILY 12 g 5  . albuterol (PROVENTIL) (2.5 MG/3ML) 0.083% nebulizer solution Take 3 mLs (2.5 mg total) by nebulization every 6 (six) hours as needed for wheezing or shortness of breath. 75 mL 5  . azelastine (ASTELIN) 0.1 % nasal spray Place 1 spray into both nostrils 2 (two) times daily. Use in each nostril as directed 30 mL 12  . fluticasone (FLONASE) 50 MCG/ACT nasal spray USE TWO SPRAY(S) IN EACH NOSTRIL ONCE DAILY 16 g 5  . gabapentin (NEURONTIN) 100 MG capsule TAKE ONE CAPSULE BY MOUTH ONCE DAILY IN THE MORNING, ONE CAPSULE AT NOON, AND THREE CAPSULES IN THE EVENING 150 capsule 3  . levalbuterol (XOPENEX HFA) 45 MCG/ACT inhaler INHALE TWO PUFFS EVERY 4 TO 6 HOURS AS NEEDED 15 g 5  . losartan (COZAAR) 25 MG tablet Take 1 tablet (25 mg  total) by mouth 2 (two) times daily. 60 tablet 3  . metoprolol succinate (TOPROL-XL) 25 MG 24 hr tablet TAKE THREE TABLETS BY MOUTH ONCE DAILY 270 tablet 1  . Multiple Vitamin (THERA) TABS Take by mouth.    . rosuvastatin (CRESTOR) 20 MG tablet TAKE ONE TABLET BY MOUTH ONCE DAILY IN THE EVENING 90 tablet 1  . temazepam (RESTORIL) 15 MG capsule     . traMADol (ULTRAM) 50 MG tablet Take 1 tablet (50 mg total) by mouth every 12 (twelve) hours as needed. 30 tablet 0  . orphenadrine (NORFLEX) 100 MG tablet Take 1 tablet (100 mg total) by mouth 2 (two) times daily as needed for muscle spasms. (Patient not taking: Reported on 04/20/2016) 60 tablet 0   No current facility-administered medications on file prior to visit.     Allergies  Allergen Reactions  . Sulfa Antibiotics Hives  . Metronidazole Nausea And Vomiting  . Acyclovir And Related   . Benadryl [Diphenhydramine Hcl]     Severe emotional reaction and doesn't work well with Pt. Past history  . Cephalosporins Hives  . Codeine Other (See Comments)    Makes patient feel odd ; "sometimes mild; sometimes severe reaction; mostly severe"  .  Cyclobenzaprine Other (See Comments)    Muscle Aches.  . Diclofenac Sodium Nausea And Vomiting  . Linzess [Linaclotide] Diarrhea  . Nitrofurantoin Monohyd Macro     Felt funny  . Prednisone Other (See Comments)    migraine  . Soma [Carisoprodol] Other (See Comments)    Cognitive Function, Daytime sleepiness  . Tizanidine     Other reaction(s): Other (See Comments) insomnia  . Baclofen Nausea Only  . Meloxicam     Other reaction(s): Confusion  . Sulfamethoxazole Rash    Family History  Problem Relation Age of Onset  . Heart attack Father 50    Living  . Arthritis Father   . Skin cancer Father   . Hypertension Father   . Hyperlipidemia Father   . Leukemia Mother 59    Deceased  . Alzheimer's disease Paternal Aunt     X2  . Stomach cancer Paternal Aunt     x1  . Arthritis/Rheumatoid  Paternal Aunt     x1  . Neuropathy Brother     Peripheal  . Fibromyalgia Sister   . HIV Sister   . Arthritis/Rheumatoid Sister   . Diabetes Paternal Grandmother     Social History   Social History  . Marital status: Legally Separated    Spouse name: N/A  . Number of children: 2  . Years of education: 2 yrs coll   Occupational History  . Disabled    Social History Main Topics  . Smoking status: Former Smoker    Packs/day: 1.00    Years: 20.00    Types: Cigarettes    Quit date: 02/29/2004  . Smokeless tobacco: Never Used  . Alcohol use 0.0 oz/week     Comment: "glass of wine q once in awhile; not very often; do it on special occasion"  . Drug use: No  . Sexual activity: No   Other Topics Concern  . None   Social History Narrative   Completed 2 years of college.    On disability.   Lives at home with her husband.   No caffeine use.   Left-handed.   Three children, 2 biological - 1 adopted.   Review of Systems - See HPI.  All other ROS are negative.  BP 122/78   Pulse (!) 58   Temp 98.1 F (36.7 C) (Oral)   Resp 14   Ht 5' 3"  (1.6 m)   Wt 163 lb (73.9 kg)   SpO2 98%   BMI 28.87 kg/m   Physical Exam  Constitutional: She is oriented to person, place, and time and well-developed, well-nourished, and in no distress.  HENT:  Head: Normocephalic and atraumatic.  Right Ear: Ear canal normal. No tenderness.  Left Ear: Ear canal normal. No tenderness.  Nose: Rhinorrhea present. Right sinus exhibits maxillary sinus tenderness. Right sinus exhibits no frontal sinus tenderness. Left sinus exhibits maxillary sinus tenderness. Left sinus exhibits no frontal sinus tenderness.  Pain in L TMJ joint with palpation and ROM. No evidence of AOM on examination. Some mild serous fluid noted behind TM bilaterally.  Eyes: Conjunctivae are normal.  Neck: Neck supple.  Cardiovascular: Normal rate, regular rhythm, normal heart sounds and intact distal pulses.   Pulmonary/Chest:  Effort normal and breath sounds normal. No respiratory distress. She has no wheezes. She has no rales. She exhibits no tenderness.  Lymphadenopathy:    She has no cervical adenopathy.  Neurological: She is alert and oriented to person, place, and time.  Skin: Skin is warm and  dry. No rash noted.  Psychiatric: Affect normal.  Vitals reviewed.   Recent Results (from the past 2160 hour(s))  Comp Met (CMET)     Status: None   Collection Time: 02/01/16 10:45 AM  Result Value Ref Range   Sodium 142 135 - 145 mEq/L   Potassium 4.2 3.5 - 5.1 mEq/L   Chloride 107 96 - 112 mEq/L   CO2 28 19 - 32 mEq/L   Glucose, Bld 83 70 - 99 mg/dL   BUN 11 6 - 23 mg/dL   Creatinine, Ser 0.60 0.40 - 1.20 mg/dL   Total Bilirubin 0.4 0.2 - 1.2 mg/dL   Alkaline Phosphatase 62 39 - 117 U/L   AST 20 0 - 37 U/L   ALT 21 0 - 35 U/L   Total Protein 6.2 6.0 - 8.3 g/dL   Albumin 4.2 3.5 - 5.2 g/dL   Calcium 9.3 8.4 - 10.5 mg/dL   GFR 108.82 >60.00 mL/min  Lipid panel     Status: None   Collection Time: 02/01/16 10:45 AM  Result Value Ref Range   Cholesterol 137 0 - 200 mg/dL    Comment: ATP III Classification       Desirable:  < 200 mg/dL               Borderline High:  200 - 239 mg/dL          High:  > = 240 mg/dL   Triglycerides 88.0 0.0 - 149.0 mg/dL    Comment: Normal:  <150 mg/dLBorderline High:  150 - 199 mg/dL   HDL 53.40 >39.00 mg/dL   VLDL 17.6 0.0 - 40.0 mg/dL   LDL Cholesterol 66 0 - 99 mg/dL   Total CHOL/HDL Ratio 3     Comment:                Men          Women1/2 Average Risk     3.4          3.3Average Risk          5.0          4.42X Average Risk          9.6          7.13X Average Risk          15.0          11.0                       NonHDL 83.75     Comment: NOTE:  Non-HDL goal should be 30 mg/dL higher than patient's LDL goal (i.e. LDL goal of < 70 mg/dL, would have non-HDL goal of < 100 mg/dL)  POCT Influenza A/B     Status: Normal   Collection Time: 02/09/16 11:43 AM  Result Value Ref  Range   Influenza A, POC Negative Negative   Influenza B, POC Negative Negative  POCT Urinalysis Dipstick     Status: Normal   Collection Time: 02/10/16  1:50 PM  Result Value Ref Range   Color, UA yellow    Clarity, UA cloudy    Glucose, UA negative    Bilirubin, UA negative    Ketones, UA negative    Spec Grav, UA 1.010    Blood, UA negative    pH, UA 6.5    Protein, UA negative    Urobilinogen, UA negative    Nitrite, UA negative    Leukocytes, UA  Negative Negative  Urine Culture     Status: None   Collection Time: 02/10/16  1:58 PM  Result Value Ref Range   Organism ID, Bacteria      Three or more organisms present,each greater than 10,000 CFU/mL.These organisms,commonly found on external and internal genitalia,are considered to be colonizers.No further testing performed.   CBC w/Diff     Status: Abnormal   Collection Time: 02/17/16  2:27 PM  Result Value Ref Range   WBC 11.1 (H) 4.0 - 10.5 K/uL   RBC 4.97 3.87 - 5.11 Mil/uL   Hemoglobin 14.1 12.0 - 15.0 g/dL   HCT 42.1 36.0 - 46.0 %   MCV 84.8 78.0 - 100.0 fl   MCHC 33.5 30.0 - 36.0 g/dL   RDW 14.8 11.5 - 15.5 %   Platelets 242.0 150.0 - 400.0 K/uL   Neutrophils Relative % 59.2 43.0 - 77.0 %   Lymphocytes Relative 34.6 12.0 - 46.0 %   Monocytes Relative 5.0 3.0 - 12.0 %   Eosinophils Relative 0.9 0.0 - 5.0 %   Basophils Relative 0.3 0.0 - 3.0 %   Neutro Abs 6.6 1.4 - 7.7 K/uL   Lymphs Abs 3.8 0.7 - 4.0 K/uL   Monocytes Absolute 0.6 0.1 - 1.0 K/uL   Eosinophils Absolute 0.1 0.0 - 0.7 K/uL   Basophils Absolute 0.0 0.0 - 0.1 K/uL  POCT Urinalysis Dipstick     Status: Abnormal   Collection Time: 02/17/16  3:38 PM  Result Value Ref Range   Color, UA straw    Clarity, UA clear    Glucose, UA negative    Bilirubin, UA negative    Ketones, UA negative    Spec Grav, UA <=1.005    Blood, UA trace    pH, UA 7.5    Protein, UA negative    Urobilinogen, UA 0.2    Nitrite, UA negative    Leukocytes, UA Negative  Negative  B12     Status: None   Collection Time: 03/31/16  9:36 AM  Result Value Ref Range   Vitamin B-12 773 211 - 911 pg/mL    Assessment/Plan: 1. Acute non-recurrent maxillary sinusitis Rx Doxycycline..  Increase fluids.  Rest.  Saline nasal spray.  Probiotic.  Mucinex as directed.  Humidifier in bedroom.  Call or return to clinic if symptoms are not improving.   2. Eustachian tube dysfunction, bilateral Rx Flonase and Astelin. Supportive measures reviewed.  3. Arthralgia of left temporomandibular joint Secondary to tooth grinding. Patient to start supportive measures reviewed. Topical ant-inflammatory. Tramadol as directed. Mouth guards at night. May need dental or ENT assessment if not improving.    Leeanne Rio, PA-C

## 2016-04-20 NOTE — Progress Notes (Signed)
Pre visit review using our clinic review tool, if applicable. No additional management support is needed unless otherwise documented below in the visit note. 

## 2016-04-20 NOTE — Patient Instructions (Addendum)
I encourage you to increase hydration and the amount of fiber in your diet.  Start a daily probiotic (Align, Culturelle, Digestive Advantage, etc.). If no bowel movement within 24 hours, take 2 Tbs of Milk of Magnesia in a 4 oz glass of warmed prune juice every 2-3 days to help promote bowel movement. If no results within 24 hours, then repeat above regimen, adding a Dulcolax stool softener to regimen. If this does not promote a bowel movement, please call the office.  Get a mouth guard and start wearing at night. Please apply Aspercreme to the skin I around the ear. You are having TMJ pain along with a sinus infection.  You will be contacted by Sports Medicine for further assessment of shoulder.   Please take antibiotic as directed.  Increase fluid intake.  Use Saline nasal spray.  Take a daily multivitamin Continue nasal sprays.  Place a humidifier in the bedroom.  Please call or return clinic if symptoms are not improving.  Sinusitis Sinusitis is redness, soreness, and swelling (inflammation) of the paranasal sinuses. Paranasal sinuses are air pockets within the bones of your face (beneath the eyes, the middle of the forehead, or above the eyes). In healthy paranasal sinuses, mucus is able to drain out, and air is able to circulate through them by way of your nose. However, when your paranasal sinuses are inflamed, mucus and air can become trapped. This can allow bacteria and other germs to grow and cause infection. Sinusitis can develop quickly and last only a short time (acute) or continue over a long period (chronic). Sinusitis that lasts for more than 12 weeks is considered chronic.  CAUSES  Causes of sinusitis include:  Allergies.  Structural abnormalities, such as displacement of the cartilage that separates your nostrils (deviated septum), which can decrease the air flow through your nose and sinuses and affect sinus drainage.  Functional abnormalities, such as when the small hairs  (cilia) that line your sinuses and help remove mucus do not work properly or are not present. SYMPTOMS  Symptoms of acute and chronic sinusitis are the same. The primary symptoms are pain and pressure around the affected sinuses. Other symptoms include:  Upper toothache.  Earache.  Headache.  Bad breath.  Decreased sense of smell and taste.  A cough, which worsens when you are lying flat.  Fatigue.  Fever.  Thick drainage from your nose, which often is green and may contain pus (purulent).  Swelling and warmth over the affected sinuses. DIAGNOSIS  Your caregiver will perform a physical exam. During the exam, your caregiver may:  Look in your nose for signs of abnormal growths in your nostrils (nasal polyps).  Tap over the affected sinus to check for signs of infection.  View the inside of your sinuses (endoscopy) with a special imaging device with a light attached (endoscope), which is inserted into your sinuses. If your caregiver suspects that you have chronic sinusitis, one or more of the following tests may be recommended:  Allergy tests.  Nasal culture A sample of mucus is taken from your nose and sent to a lab and screened for bacteria.  Nasal cytology A sample of mucus is taken from your nose and examined by your caregiver to determine if your sinusitis is related to an allergy. TREATMENT  Most cases of acute sinusitis are related to a viral infection and will resolve on their own within 10 days. Sometimes medicines are prescribed to help relieve symptoms (pain medicine, decongestants, nasal steroid sprays, or  saline sprays).  However, for sinusitis related to a bacterial infection, your caregiver will prescribe antibiotic medicines. These are medicines that will help kill the bacteria causing the infection.  Rarely, sinusitis is caused by a fungal infection. In theses cases, your caregiver will prescribe antifungal medicine. For some cases of chronic sinusitis,  surgery is needed. Generally, these are cases in which sinusitis recurs more than 3 times per year, despite other treatments. HOME CARE INSTRUCTIONS   Drink plenty of water. Water helps thin the mucus so your sinuses can drain more easily.  Use a humidifier.  Inhale steam 3 to 4 times a day (for example, sit in the bathroom with the shower running).  Apply a warm, moist washcloth to your face 3 to 4 times a day, or as directed by your caregiver.  Use saline nasal sprays to help moisten and clean your sinuses.  Take over-the-counter or prescription medicines for pain, discomfort, or fever only as directed by your caregiver. SEEK IMMEDIATE MEDICAL CARE IF:  You have increasing pain or severe headaches.  You have nausea, vomiting, or drowsiness.  You have swelling around your face.  You have vision problems.  You have a stiff neck.  You have difficulty breathing.acute ma MAKE SURE YOU:   Understand these instructions.  Will watch your condition.  Will get help right away if you are not doing well or get worse. Document Released: 02/14/2005 Document Revised: 05/09/2011 Document Reviewed: 03/01/2011 [facility] Patient Information 2014 [place], [place].

## 2016-04-21 ENCOUNTER — Telehealth: Payer: Self-pay | Admitting: Physician Assistant

## 2016-04-21 DIAGNOSIS — M25512 Pain in left shoulder: Secondary | ICD-10-CM | POA: Diagnosis not present

## 2016-04-21 DIAGNOSIS — R1033 Periumbilical pain: Secondary | ICD-10-CM | POA: Diagnosis not present

## 2016-04-21 NOTE — Telephone Encounter (Signed)
Patient walked into our office around 1235 stating she had been trying to get in touch with Cody's office for 2 hours and could not get an answer. Patient stated she is feeling nausea and having shoulder, back and side pain. Informed patient that we had no appointments today in this office. Gave patient a bottle of water and went to talk to a nurse. Spoke with Shirlean Mylar E and explained the situation. Was informed by nurse to apologize to patient that we could not see her and she needed to go downstairs to the ER. Patient informed and stated she would go to the ER.

## 2016-04-21 NOTE — Telephone Encounter (Signed)
FYI

## 2016-04-21 NOTE — Telephone Encounter (Signed)
Pt called back a 2nd time LMOVM wanting a call back, called pt back to inform her that Jasmine [name] was not in the office today and that if she was not feeling well that I could transfer call to [facility], pt ask that I transfer call.

## 2016-04-21 NOTE — Telephone Encounter (Signed)
Patient has been scheduled for reassessment.

## 2016-04-21 NOTE — Telephone Encounter (Signed)
Pt Jasmine [name] asking for a call back regarding pain in knees,shoulders, and neck.

## 2016-04-21 NOTE — Telephone Encounter (Signed)
Patient Name: Jasmine [name]  DOB: 12-20-1955    Initial Comment Caller states she has nausea, earache and side pain    Nurse Assessment  Nurse: Raphael Gibney, RN, Vanita Ingles Date/Time (Eastern Time): 04/21/2016 1:15:40 PM  Confirm and document reason for call. If symptomatic, describe symptoms. ---Caller states she has nausea and pain on the left side in the flank area. Pain level 6. Pain in her left ear. She was diagnosed with ear infection yesterday and prescribed antibiotics.  Does the patient have any new or worsening symptoms? ---Yes  Will a triage be completed? ---Yes  Related visit to physician within the last 2 weeks? ---No  Does the PT have any chronic conditions? (i.e. diabetes, asthma, etc.) ---Yes  List chronic conditions. ---HTN  Is this a behavioral health or substance abuse call? ---No     Guidelines    Guideline Title Affirmed Question Affirmed Notes  Flank Pain MODERATE pain (e.g., interferes with normal activities or awakens from sleep)    Final Disposition User   See Physician within 24 Hours [place], RN, Vanita Ingles    Comments  appt scheduled with Elyn Aquas at 10:00 am on 04/22/2016   Referrals  REFERRED TO PCP OFFICE   Disagree/Comply: Comply

## 2016-04-22 ENCOUNTER — Ambulatory Visit: Payer: Self-pay | Admitting: Physician Assistant

## 2016-04-22 ENCOUNTER — Other Ambulatory Visit: Payer: Self-pay | Admitting: Emergency Medicine

## 2016-04-22 DIAGNOSIS — G43109 Migraine with aura, not intractable, without status migrainosus: Secondary | ICD-10-CM

## 2016-04-22 DIAGNOSIS — R110 Nausea: Secondary | ICD-10-CM

## 2016-04-22 MED ORDER — PROMETHAZINE HCL 12.5 MG PO TABS: 12.5000 mg | ORAL_TABLET | Freq: Four times a day (QID) | ORAL | 0 refills | Status: DC | PRN

## 2016-04-25 ENCOUNTER — Telehealth: Payer: Self-pay | Admitting: Physician Assistant

## 2016-04-25 DIAGNOSIS — M25512 Pain in left shoulder: Secondary | ICD-10-CM | POA: Diagnosis not present

## 2016-04-25 DIAGNOSIS — M179 Osteoarthritis of knee, unspecified: Secondary | ICD-10-CM | POA: Diagnosis not present

## 2016-04-25 DIAGNOSIS — M25561 Pain in right knee: Secondary | ICD-10-CM | POA: Diagnosis not present

## 2016-04-25 NOTE — Telephone Encounter (Signed)
Patient calling to request referral to see ophthalmologist for blurry vision.  She reports blurriness in right eye for the past 3 months.  She reports blurriness in left eye since yesterday.  Patient states she was previously seen by Dr. Bing Plume but does not want to see him again.  Patient also reports [name] was supposed to order an ultrasound of her shoulder due to shoulder pain.  She would like to know status of order.

## 2016-04-25 NOTE — Telephone Encounter (Signed)
Please call patient to assess further as I have just seen this. She needs triage. Would recommend ER for acute vision changes.   In regards to [place], she was being set up with Dr. Barbaraann Barthel with Sports medicine who will be doing an assessment and be the one to [place] the shoulder.

## 2016-04-25 NOTE — Telephone Encounter (Signed)
Spoke to patient regarding symptoms. Patient reports blurriness and fluid in right eye x 3 months, blurriness in left eye since yesterday. Patient denies vision loss, injury, headache or other symptoms. Advised patient to go to Emergency Department for evaluation. Patient states she would discuss this with her husband to determine if she would go due to co-pay cost. Encouraged again to be evaluated in Emergency Department. Patient verbalized understanding.   Advised patient Dr. Barbaraann Barthel will be assessing and treating shoulder, patient verbalized understanding.

## 2016-04-26 ENCOUNTER — Other Ambulatory Visit: Payer: Self-pay

## 2016-04-26 DIAGNOSIS — H5789 Other specified disorders of eye and adnexa: Secondary | ICD-10-CM

## 2016-04-26 DIAGNOSIS — H538 Other visual disturbances: Secondary | ICD-10-CM

## 2016-04-26 NOTE — Telephone Encounter (Signed)
Ok to place referral to ophthalmology for further assessment if she is not coming in for evaluation.

## 2016-04-26 NOTE — Telephone Encounter (Signed)
Please call to assess patient this morning. I cannot see where she went to an ER facility in our system.

## 2016-04-26 NOTE — Telephone Encounter (Signed)
Spoke with patient, reports blurriness has resolved but continues to have "a film" over her eye with dried mucus in the mornings. Patient did not go to Emergency Department as advised yesterday. Patient requesting referral to optometrist. Please advise.

## 2016-04-26 NOTE — Telephone Encounter (Signed)
Opthalmology referral placed per PCP. Patient to return call with preferred provider.

## 2016-04-26 NOTE — Telephone Encounter (Signed)
LM requesting return call regarding vision/symptoms.

## 2016-04-27 DIAGNOSIS — H01004 Unspecified blepharitis left upper eyelid: Secondary | ICD-10-CM | POA: Diagnosis not present

## 2016-04-27 DIAGNOSIS — H01005 Unspecified blepharitis left lower eyelid: Secondary | ICD-10-CM | POA: Diagnosis not present

## 2016-04-27 DIAGNOSIS — H04123 Dry eye syndrome of bilateral lacrimal glands: Secondary | ICD-10-CM

## 2016-04-27 DIAGNOSIS — H01001 Unspecified blepharitis right upper eyelid: Secondary | ICD-10-CM | POA: Diagnosis not present

## 2016-04-27 DIAGNOSIS — H0100A Unspecified blepharitis right eye, upper and lower eyelids: Secondary | ICD-10-CM | POA: Insufficient documentation

## 2016-04-27 DIAGNOSIS — H0100B Unspecified blepharitis left eye, upper and lower eyelids: Secondary | ICD-10-CM

## 2016-04-27 HISTORY — DX: Unspecified blepharitis left eye, upper and lower eyelids: H01.00B

## 2016-04-27 HISTORY — DX: Dry eye syndrome of bilateral lacrimal glands: H04.123

## 2016-04-27 HISTORY — DX: Unspecified blepharitis right eye, upper and lower eyelids: H01.00A

## 2016-04-28 DIAGNOSIS — M25561 Pain in right knee: Secondary | ICD-10-CM | POA: Diagnosis not present

## 2016-04-28 DIAGNOSIS — H9202 Otalgia, left ear: Secondary | ICD-10-CM | POA: Diagnosis not present

## 2016-04-28 DIAGNOSIS — M179 Osteoarthritis of knee, unspecified: Secondary | ICD-10-CM | POA: Diagnosis not present

## 2016-04-28 DIAGNOSIS — M25512 Pain in left shoulder: Secondary | ICD-10-CM | POA: Diagnosis not present

## 2016-05-02 ENCOUNTER — Telehealth: Payer: Self-pay | Admitting: Physician Assistant

## 2016-05-02 DIAGNOSIS — M25561 Pain in right knee: Secondary | ICD-10-CM | POA: Diagnosis not present

## 2016-05-02 DIAGNOSIS — M25512 Pain in left shoulder: Secondary | ICD-10-CM | POA: Diagnosis not present

## 2016-05-02 DIAGNOSIS — M179 Osteoarthritis of knee, unspecified: Secondary | ICD-10-CM | POA: Diagnosis not present

## 2016-05-02 NOTE — Telephone Encounter (Signed)
She has intolerance to numerous skeletal muscle relaxants. As such I am very hesitant to send in a medication in the same class. We may need input from a specialist.

## 2016-05-02 NOTE — Telephone Encounter (Signed)
Advised patient due to her intolerance to several muscle relaxer's, will not start mediation until she sees Neurology. Her appointment is 06/13/16 Patient states she can't wait that long, Is there anything else to take for Fibromyalgia pain

## 2016-05-02 NOTE — Telephone Encounter (Signed)
Could consider increasing Gabapentin dosage or we could add on low-dose Cymbalta as options.

## 2016-05-02 NOTE — Telephone Encounter (Signed)
(  Voice mail received on phone at front desk on 05/02/16 at 2:13pm)  Patient requesting rx for muscle relaxant due to having spasms in her knees, shoulder and thighs.  Her friend recommended she take skelaxin.  Patient requesting call back to discuss.

## 2016-05-03 ENCOUNTER — Other Ambulatory Visit: Payer: Self-pay | Admitting: Emergency Medicine

## 2016-05-03 MED ORDER — GABAPENTIN 100 MG PO CAPS: ORAL_CAPSULE | ORAL | 3 refills | Status: DC

## 2016-05-03 NOTE — Telephone Encounter (Signed)
Sent in Gabapentin 100 mg 2 caps in am, 2 cap in afternoon, 3 caps in evening to the pharmacy. Will call patient and let her know we will need to schedule an appointment for follow up of pain/fibromyalgia.

## 2016-05-03 NOTE — Telephone Encounter (Signed)
Left detailed message on machine advising we can increase dosage of the Gabapentin or low dose of Cymbalta to help with Fibromyalgia pain.

## 2016-05-03 NOTE — Telephone Encounter (Signed)
Can increase to 200 mg AM, 200 mg Noon and 300 mg PM. Want to make small changes at a time due to her history of medication intolerances. FU with me in 1 week.

## 2016-05-03 NOTE — Telephone Encounter (Signed)
Patient call back and wanted to increase the dosage of her Gabapentin for pain control. Please advise

## 2016-05-04 ENCOUNTER — Other Ambulatory Visit: Payer: Self-pay | Admitting: Physician Assistant

## 2016-05-04 DIAGNOSIS — G8929 Other chronic pain: Secondary | ICD-10-CM

## 2016-05-04 DIAGNOSIS — M25512 Pain in left shoulder: Principal | ICD-10-CM

## 2016-05-04 NOTE — Telephone Encounter (Signed)
Pt LMOVM stating that the gabapentin has not been called into pharmacy and also stating that she has not heard anything about shoulder pain. Pt asking for a call back

## 2016-05-05 ENCOUNTER — Encounter: Payer: Self-pay | Admitting: Family Medicine

## 2016-05-05 ENCOUNTER — Ambulatory Visit (INDEPENDENT_AMBULATORY_CARE_PROVIDER_SITE_OTHER): Payer: 59 | Admitting: Family Medicine

## 2016-05-05 VITALS — BP 131/76 | HR 65 | Ht 64.0 in | Wt 165.0 lb

## 2016-05-05 DIAGNOSIS — M25512 Pain in left shoulder: Secondary | ICD-10-CM

## 2016-05-05 DIAGNOSIS — M179 Osteoarthritis of knee, unspecified: Secondary | ICD-10-CM | POA: Diagnosis not present

## 2016-05-05 DIAGNOSIS — M25561 Pain in right knee: Secondary | ICD-10-CM | POA: Diagnosis not present

## 2016-05-05 MED ORDER — METHYLPREDNISOLONE ACETATE 40 MG/ML IJ SUSP
40.0000 mg | Freq: Once | INTRAMUSCULAR | Status: AC
  Administered 2016-05-05: 40 mg

## 2016-05-05 NOTE — Patient Instructions (Addendum)
You have rotator cuff impingement Try to avoid painful activities (overhead activities, lifting with extended arm) as much as possible. Can take tylenol in addition to the shot. Subacromial injection may be beneficial to help with pain and to decrease inflammation - you were given this today. Consider physical therapy with transition to home exercise program. Do home exercise program with theraband and scapular stabilization exercises daily - these are very important for long term relief even if an injection was given.  3 sets of 10 once a day. If not improving at follow-up we will consider further imaging, physical therapy, and/or nitro patches. Follow up with me in 6 weeks.

## 2016-05-05 NOTE — Telephone Encounter (Signed)
Appointment has been scheduled today for Dr. Barbaraann Barthel for shoulder.  Gabapentin has been sent in -- we resent yesterday just to make sure they had received.

## 2016-05-06 ENCOUNTER — Other Ambulatory Visit: Payer: Self-pay | Admitting: Physician Assistant

## 2016-05-06 MED ORDER — ALBUTEROL SULFATE HFA 108 (90 BASE) MCG/ACT IN AERS
2.0000 | INHALATION_SPRAY | Freq: Four times a day (QID) | RESPIRATORY_TRACT | 2 refills | Status: DC | PRN
Start: 2016-05-06 — End: 2018-09-17

## 2016-05-11 DIAGNOSIS — M25512 Pain in left shoulder: Secondary | ICD-10-CM | POA: Insufficient documentation

## 2016-05-11 NOTE — Assessment & Plan Note (Signed)
2/2 rotator cuff impingement.  Reviewed home exercises to do daily.  She opted for injection as well.  Consider physical therapy, further imaging, nitro patches if not improving.  Tylenol if needed for pain.  F/u in 6 weeks.  After informed written consent, patient was seated on exam table. Left shoulder was prepped with alcohol swab and utilizing posterior approach, patient's left subacromial space was injected with 3:1 bupivicaine: depomedrol. Patient tolerated the procedure well without immediate complications.

## 2016-05-11 NOTE — Progress Notes (Signed)
PCP and consultation requested by: Leeanne Rio, PA-C  Subjective:   HPI: Patient is a 61 y.o. female here for left shoulder pain.  Patient reports she's had about 3 months of left shoulder pain. No known injury or trauma. Pain developed insidiously lateral upper arm and anterior aspect of shoulder. Pain up to 5/10 and worse reaching overhead, sharp. Takes gabapentin but no other medications for this. No night pain. No skin changes, numbness. Is left handed.  Past Medical History:  Diagnosis Date  . Anxiety   . Asthma   . Chronic high back pain   . Double vision   . Fibromyalgia   . Heart murmur   . High cholesterol   . Hyperparathyroidism ([place])   . Hypertension   . Hypoglycemia   . Kidney stone   . Meniscus tear    Right  . Migraine 08/07/2015  . PTSD (post-traumatic stress disorder)   . Schizophrenia ([place])    treated by Dr. Casimiro Needle  . Seizures ([place])    last seizure 3 years ago, not on AED  . Stroke ([place]) 07/08/11   "I've had mini strokes before; left side of face  is more down than right "  . Syncope and collapse    One episode - 07/10/15    Current Outpatient Prescriptions on File Prior to Visit  Medication Sig Dispense Refill  . ADVAIR HFA 115-21 MCG/ACT inhaler INHALE ONE PUFF BY MOUTH TWICE DAILY 12 g 5  . azelastine (ASTELIN) 0.1 % nasal spray Place 1 spray into both nostrils 2 (two) times daily. Use in each nostril as directed 30 mL 12  . fluticasone (FLONASE) 50 MCG/ACT nasal spray USE TWO SPRAY(S) IN EACH NOSTRIL ONCE DAILY 16 g 5  . gabapentin (NEURONTIN) 100 MG capsule TAKE TWO CAPSULES BY MOUTH IN THE MORNING, TWO CAPSULES AT NOON, AND THREE CAPSULES IN THE EVENING 210 capsule 3  . losartan (COZAAR) 25 MG tablet Take 1 tablet (25 mg total) by mouth 2 (two) times daily. 60 tablet 3  . Multiple Vitamin (THERA) TABS Take by mouth.    . promethazine (PHENERGAN) 12.5 MG tablet Take 1 tablet (12.5 mg total) by mouth every 6 (six) hours as needed for  nausea. 20 tablet 0  . rosuvastatin (CRESTOR) 20 MG tablet TAKE ONE TABLET BY MOUTH ONCE DAILY IN THE EVENING 90 tablet 1  . temazepam (RESTORIL) 15 MG capsule     . zolpidem (AMBIEN) 10 MG tablet Take 1 tablet by mouth at bedtime.     No current facility-administered medications on file prior to visit.     Past Surgical History:  Procedure Laterality Date  . COCHLEAR IMPLANT  2010   left  . [place]   left  . KIDNEY STONE SURGERY  ~ 2006  . TONSILLECTOMY     "as a child"  . WISDOM TOOTH EXTRACTION      Allergies  Allergen Reactions  . Sulfa Antibiotics Hives  . Metronidazole Nausea And Vomiting  . Acyclovir And Related   . Benadryl [Diphenhydramine Hcl]     Severe emotional reaction and doesn't work well with Pt. Past history  . Cephalosporins Hives  . Codeine Other (See Comments)    Makes patient feel odd ; "sometimes mild; sometimes severe reaction; mostly severe"  . Cyclobenzaprine Other (See Comments)    Muscle Aches.  . Diclofenac Sodium Nausea And Vomiting  . Linzess [Linaclotide] Diarrhea  . Nitrofurantoin Monohyd Macro     Felt  funny  . Prednisone Other (See Comments)    migraine  . Soma [Carisoprodol] Other (See Comments)    Cognitive Function, Daytime sleepiness  . Tizanidine     Other reaction(s): Other (See Comments) insomnia  . Baclofen Nausea Only  . Meloxicam     Other reaction(s): Confusion  . Sulfamethoxazole Rash    Social History   Social History  . Marital status: Legally Separated    Spouse name: N/A  . Number of children: 2  . Years of education: 2 yrs coll   Occupational History  . Disabled    Social History Main Topics  . Smoking status: Former Smoker    Packs/day: 1.00    Years: 20.00    Types: Cigarettes    Quit date: 02/29/2004  . Smokeless tobacco: Never Used  . Alcohol use 0.0 oz/week     Comment: "glass of wine q once in awhile; not very often; do it on special occasion"  . Drug use: No  . Sexual  activity: No   Other Topics Concern  . Not on file   Social History Narrative   Completed 2 years of college.    On disability.   Lives at home with her husband.   No caffeine use.   Left-handed.   Three children, 2 biological - 1 adopted.    Family History  Problem Relation Age of Onset  . Heart attack Father 22    Living  . Arthritis Father   . Skin cancer Father   . Hypertension Father   . Hyperlipidemia Father   . Leukemia Mother 71    Deceased  . Alzheimer's disease Paternal Aunt     X2  . Stomach cancer Paternal Aunt     x1  . Arthritis/Rheumatoid Paternal Aunt     x1  . Neuropathy Brother     Peripheal  . Fibromyalgia Sister   . HIV Sister   . Arthritis/Rheumatoid Sister   . Diabetes Paternal Grandmother     BP 131/76   Pulse 65   Ht 5\' 4"  (1.626 m)   Wt 165 lb (74.8 kg)   BMI 28.32 kg/m   Review of Systems: See HPI above.     Objective:  Physical Exam:  Gen: NAD, comfortable in exam room  Left shoulder: No swelling, ecchymoses.  No gross deformity. TTP lateral shoulder.  No other tenderness. FROM with painful arc. Positive Hawkins, Neers. Negative Yergasons. Strength 5/5 with empty can and resisted internal/external rotation.  Pain empty can and ER. Negative apprehension. NV intact distally.  Right shoulder: FROM without pain.   Assessment & Plan:  1. Left shoulder pain - 2/2 rotator cuff impingement.  Reviewed home exercises to do daily.  She opted for injection as well.  Consider physical therapy, further imaging, nitro patches if not improving.  Tylenol if needed for pain.  F/u in 6 weeks.  After informed written consent, patient was seated on exam table. Left shoulder was prepped with alcohol swab and utilizing posterior approach, patient's left subacromial space was injected with 3:1 bupivicaine: depomedrol. Patient tolerated the procedure well without immediate complications.

## 2016-05-12 DIAGNOSIS — M25512 Pain in left shoulder: Secondary | ICD-10-CM | POA: Diagnosis not present

## 2016-05-12 DIAGNOSIS — M179 Osteoarthritis of knee, unspecified: Secondary | ICD-10-CM | POA: Diagnosis not present

## 2016-05-12 DIAGNOSIS — M25561 Pain in right knee: Secondary | ICD-10-CM | POA: Diagnosis not present

## 2016-05-16 DIAGNOSIS — M1711 Unilateral primary osteoarthritis, right knee: Secondary | ICD-10-CM | POA: Diagnosis not present

## 2016-05-16 DIAGNOSIS — M5136 Other intervertebral disc degeneration, lumbar region: Secondary | ICD-10-CM | POA: Diagnosis not present

## 2016-05-16 DIAGNOSIS — M7542 Impingement syndrome of left shoulder: Secondary | ICD-10-CM | POA: Diagnosis not present

## 2016-05-17 DIAGNOSIS — M25561 Pain in right knee: Secondary | ICD-10-CM | POA: Diagnosis not present

## 2016-05-17 DIAGNOSIS — M179 Osteoarthritis of knee, unspecified: Secondary | ICD-10-CM | POA: Diagnosis not present

## 2016-05-17 DIAGNOSIS — M25512 Pain in left shoulder: Secondary | ICD-10-CM | POA: Diagnosis not present

## 2016-05-19 ENCOUNTER — Encounter: Payer: Self-pay | Admitting: Physician Assistant

## 2016-05-19 ENCOUNTER — Ambulatory Visit (INDEPENDENT_AMBULATORY_CARE_PROVIDER_SITE_OTHER): Payer: 59 | Admitting: Physician Assistant

## 2016-05-19 VITALS — BP 108/70 | HR 67 | Temp 97.5°F | Resp 14 | Ht 64.0 in | Wt 177.0 lb

## 2016-05-19 DIAGNOSIS — B9789 Other viral agents as the cause of diseases classified elsewhere: Secondary | ICD-10-CM

## 2016-05-19 DIAGNOSIS — M25561 Pain in right knee: Secondary | ICD-10-CM | POA: Diagnosis not present

## 2016-05-19 DIAGNOSIS — M79675 Pain in left toe(s): Secondary | ICD-10-CM

## 2016-05-19 DIAGNOSIS — M25512 Pain in left shoulder: Secondary | ICD-10-CM | POA: Diagnosis not present

## 2016-05-19 DIAGNOSIS — M179 Osteoarthritis of knee, unspecified: Secondary | ICD-10-CM | POA: Diagnosis not present

## 2016-05-19 DIAGNOSIS — J069 Acute upper respiratory infection, unspecified: Secondary | ICD-10-CM

## 2016-05-19 NOTE — Progress Notes (Signed)
Patient presents to clinic today to discuss referral to podiatry for toe pain. Has an appointment scheduled for next Monday with Dr. Leane Call. She was told she would need a referral in order to complete appointment.  Patient endorses the great toe of her left foot has been raised and rubbing the top of her foot. Denies trauma or injury. Denies redness, swelling or tenderness of skin or joints. Denies numbness or tingling to the area.  Patient notes 2 days of dry cough with some sinus pressure and nasal congestion. Denies fever, chills, malaise/fatigue. Denies sinus pain, ear pain or tooth pain. Was around her son last week who had similar symptoms. Has not taken anything for symptoms.  Past Medical History:  Diagnosis Date  . Anxiety   . Asthma   . Chronic high back pain   . Double vision   . Fibromyalgia   . Heart murmur   . High cholesterol   . Hyperparathyroidism ([place])   . Hypertension   . Hypoglycemia   . Kidney stone   . Meniscus tear    Right  . Migraine 08/07/2015  . PTSD (post-traumatic stress disorder)   . Schizophrenia ([place])    treated by Dr. Casimiro Needle  . Seizures ([place])    last seizure 3 years ago, not on AED  . Stroke ([place]) 07/08/11   "I've had mini strokes before; left side of face  is more down than right "  . Syncope and collapse    One episode - 07/10/15    Current Outpatient Prescriptions on File Prior to Visit  Medication Sig Dispense Refill  . ADVAIR HFA 115-21 MCG/ACT inhaler INHALE ONE PUFF BY MOUTH TWICE DAILY 12 g 5  . albuterol (PROVENTIL HFA;VENTOLIN HFA) 108 (90 Base) MCG/ACT inhaler Inhale 2 puffs into the lungs every 6 (six) hours as needed for wheezing or shortness of breath. 1 Inhaler 2  . azelastine (ASTELIN) 0.1 % nasal spray Place 1 spray into both nostrils 2 (two) times daily. Use in each nostril as directed 30 mL 12  . fluticasone (FLONASE) 50 MCG/ACT nasal spray USE TWO SPRAY(S) IN EACH NOSTRIL ONCE DAILY 16 g 5  . gabapentin (NEURONTIN)  100 MG capsule TAKE TWO CAPSULES BY MOUTH IN THE MORNING, TWO CAPSULES AT NOON, AND THREE CAPSULES IN THE EVENING 210 capsule 3  . losartan (COZAAR) 25 MG tablet Take 1 tablet (25 mg total) by mouth 2 (two) times daily. 60 tablet 3  . metoprolol succinate (TOPROL-XL) 50 MG 24 hr tablet Take by mouth.    . Multiple Vitamin (THERA) TABS Take by mouth.    . promethazine (PHENERGAN) 12.5 MG tablet Take 1 tablet (12.5 mg total) by mouth every 6 (six) hours as needed for nausea. 20 tablet 0  . rosuvastatin (CRESTOR) 20 MG tablet TAKE ONE TABLET BY MOUTH ONCE DAILY IN THE EVENING 90 tablet 1  . temazepam (RESTORIL) 15 MG capsule     . zolpidem (AMBIEN) 10 MG tablet Take 1 tablet by mouth at bedtime.     No current facility-administered medications on file prior to visit.     Allergies  Allergen Reactions  . Sulfa Antibiotics Hives  . Metronidazole Nausea And Vomiting  . Acyclovir And Related   . Benadryl [Diphenhydramine Hcl]     Severe emotional reaction and doesn't work well with Pt. Past history  . Cephalosporins Hives  . Codeine Other (See Comments)    Makes patient feel odd ; "sometimes mild; sometimes severe reaction; mostly  severe"  . Cyclobenzaprine Other (See Comments)    Muscle Aches.  . Diclofenac Sodium Nausea And Vomiting  . Linzess [Linaclotide] Diarrhea  . Nitrofurantoin Monohyd Macro     Felt funny  . Prednisone Other (See Comments)    migraine  . Soma [Carisoprodol] Other (See Comments)    Cognitive Function, Daytime sleepiness  . Tizanidine     Other reaction(s): Other (See Comments) insomnia  . Baclofen Nausea Only  . Meloxicam     Other reaction(s): Confusion  . Sulfamethoxazole Rash    Family History  Problem Relation Age of Onset  . Heart attack Father 23    Living  . Arthritis Father   . Skin cancer Father   . Hypertension Father   . Hyperlipidemia Father   . Leukemia Mother 29    Deceased  . Alzheimer's disease Paternal Aunt     X2  . Stomach  cancer Paternal Aunt     x1  . Arthritis/Rheumatoid Paternal Aunt     x1  . Neuropathy Brother     Peripheal  . Fibromyalgia Sister   . HIV Sister   . Arthritis/Rheumatoid Sister   . Diabetes Paternal Grandmother     Social History   Social History  . Marital status: Legally Separated    Spouse name: N/A  . Number of children: 2  . Years of education: 2 yrs coll   Occupational History  . Disabled    Social History Main Topics  . Smoking status: Former Smoker    Packs/day: 1.00    Years: 20.00    Types: Cigarettes    Quit date: 02/29/2004  . Smokeless tobacco: Never Used  . Alcohol use 0.0 oz/week     Comment: "glass of wine q once in awhile; not very often; do it on special occasion"  . Drug use: No  . Sexual activity: No   Other Topics Concern  . None   Social History Narrative   Completed 2 years of college.    On disability.   Lives at home with her husband.   No caffeine use.   Left-handed.   Three children, 2 biological - 1 adopted.   Review of Systems - See HPI.  All other ROS are negative.  BP 108/70   Pulse 67   Temp 97.5 F (36.4 C) (Oral)   Resp 14   Ht 5\' 4"  (1.626 m)   Wt 177 lb (80.3 kg)   SpO2 98%   BMI 30.38 kg/m   Physical Exam  Constitutional: She is oriented to person, place, and time and well-developed, well-nourished, and in no distress.  HENT:  Head: Normocephalic and atraumatic.  Right Ear: External ear normal.  Left Ear: External ear normal.  Nose: Nose normal.  Mouth/Throat: Oropharynx is clear and moist. No oropharyngeal exudate.  TM within normal limits bilaterally.  Eyes: Conjunctivae are normal.  Neck: Neck supple.  Cardiovascular: Normal rate, regular rhythm, normal heart sounds and intact distal pulses.   Pulmonary/Chest: Effort normal and breath sounds normal. No respiratory distress. She has no wheezes. She has no rales. She exhibits no tenderness.  Musculoskeletal:       Feet:  Lymphadenopathy:    She has no  cervical adenopathy.  Neurological: She is alert and oriented to person, place, and time.  Skin: Skin is warm and dry. No rash noted.  Psychiatric: Affect normal.  Vitals reviewed.  Recent Results (from the past 2160 hour(s))  B12     Status: None  Collection Time: 03/31/16  9:36 AM  Result Value Ref Range   Vitamin B-12 773 211 - 911 pg/mL   Assessment/Plan: 1. Pain of toe of left foot Referral placed for insurance purposes. Patient to follow-up with specialist as scheduled Monday. No sign of trauma or infection. Joint ROM within normal limits. - Ambulatory referral to Podiatry  2. Viral URI Supportive measures and OTC medications reviewed. Return precautions discussed with patient.    Leeanne Rio, PA-C

## 2016-05-19 NOTE — Patient Instructions (Addendum)
Please stay well hydrated and get plenty of rest. Place a humidifier in the bedroom. Delsym for cough if needed. Symptoms should dissipate on their own. If not or anything worsens, please let me know.   I have placed the referral for podiatry.  Please have them fax a copy of their notes to [place]! [phone]

## 2016-05-19 NOTE — Progress Notes (Signed)
Pre visit review using our clinic review tool, if applicable. No additional management support is needed unless otherwise documented below in the visit note. 

## 2016-05-23 DIAGNOSIS — M25512 Pain in left shoulder: Secondary | ICD-10-CM | POA: Diagnosis not present

## 2016-05-23 DIAGNOSIS — M179 Osteoarthritis of knee, unspecified: Secondary | ICD-10-CM | POA: Diagnosis not present

## 2016-05-23 DIAGNOSIS — M2032 Hallux varus (acquired), left foot: Secondary | ICD-10-CM | POA: Diagnosis not present

## 2016-05-23 DIAGNOSIS — M25561 Pain in right knee: Secondary | ICD-10-CM | POA: Diagnosis not present

## 2016-05-26 DIAGNOSIS — M25561 Pain in right knee: Secondary | ICD-10-CM | POA: Diagnosis not present

## 2016-05-26 DIAGNOSIS — M179 Osteoarthritis of knee, unspecified: Secondary | ICD-10-CM | POA: Diagnosis not present

## 2016-05-26 DIAGNOSIS — M25512 Pain in left shoulder: Secondary | ICD-10-CM | POA: Diagnosis not present

## 2016-05-28 ENCOUNTER — Other Ambulatory Visit: Payer: Self-pay | Admitting: Physician Assistant

## 2016-06-01 DIAGNOSIS — M179 Osteoarthritis of knee, unspecified: Secondary | ICD-10-CM | POA: Diagnosis not present

## 2016-06-01 DIAGNOSIS — M25512 Pain in left shoulder: Secondary | ICD-10-CM | POA: Diagnosis not present

## 2016-06-01 DIAGNOSIS — M25561 Pain in right knee: Secondary | ICD-10-CM | POA: Diagnosis not present

## 2016-06-03 DIAGNOSIS — M25512 Pain in left shoulder: Secondary | ICD-10-CM | POA: Diagnosis not present

## 2016-06-03 DIAGNOSIS — M25561 Pain in right knee: Secondary | ICD-10-CM | POA: Diagnosis not present

## 2016-06-03 DIAGNOSIS — M179 Osteoarthritis of knee, unspecified: Secondary | ICD-10-CM | POA: Diagnosis not present

## 2016-06-06 ENCOUNTER — Encounter: Payer: Self-pay | Admitting: Physician Assistant

## 2016-06-09 ENCOUNTER — Telehealth: Payer: Self-pay | Admitting: Physician Assistant

## 2016-06-09 DIAGNOSIS — M25512 Pain in left shoulder: Secondary | ICD-10-CM | POA: Diagnosis not present

## 2016-06-09 DIAGNOSIS — M25561 Pain in right knee: Secondary | ICD-10-CM | POA: Diagnosis not present

## 2016-06-09 DIAGNOSIS — M179 Osteoarthritis of knee, unspecified: Secondary | ICD-10-CM | POA: Diagnosis not present

## 2016-06-09 DIAGNOSIS — M797 Fibromyalgia: Secondary | ICD-10-CM | POA: Diagnosis not present

## 2016-06-09 NOTE — Telephone Encounter (Signed)
Office closed at [facility] I am seeing this now. Recommend if severe pain she go to ER or an Urgent Care for assessment as we cannot see her until tomorrow morning.

## 2016-06-09 NOTE — Telephone Encounter (Signed)
[facility] Patient Name: Jasmine [name] DOB: [birth year] Initial Comment Caller states she is having a bad flare up of fibromyalgia Nurse Assessment Nurse: Jimmey Ralph, RN, Lissa Date/Time ([place] Time): 06/09/2016 4:17:58 PM Confirm and document reason for call. If symptomatic, describe symptoms. ---Caller states she is having a bad flare up of fibromyalgia. It started a couple of hours ago and really bad last 70minutes, I am hurting both shoulders and both arms and down my back. I am took gabapentin and then another gabapentin and then aleve. The medication isn't helping. My pain level is 10/10 and really sharp pain. Does the patient have any new or worsening symptoms? ---Yes Will a triage be completed? ---Yes Related visit to physician within the last 2 weeks? ---Yes Does the PT have any chronic conditions? (i.e. diabetes, asthma, etc.) ---Yes List chronic conditions. ---Fibromylgia Sleep problems Cholesteraol HTN Is this a behavioral health or substance abuse call? ---No Guidelines Guideline Title Affirmed Question Affirmed Notes Back Pain [1] SEVERE back pain (e.g., excruciating, unable to do any normal activities) AND [2] not improved 2 hours after pain medicine Final Disposition User See Physician within 4 Hours (or PCP triage) Hammonds, RN, Lissa Referrals REFERRED TO PCP OFFICE GO TO FACILITY UNDECIDED REFERRED TO PCP OFFICE Disagree/Comply: Comply

## 2016-06-13 ENCOUNTER — Encounter: Payer: Self-pay | Admitting: Neurology

## 2016-06-13 ENCOUNTER — Ambulatory Visit (INDEPENDENT_AMBULATORY_CARE_PROVIDER_SITE_OTHER): Payer: 59 | Admitting: Neurology

## 2016-06-13 VITALS — BP 120/78 | HR 96 | Ht 64.0 in | Wt 187.2 lb

## 2016-06-13 DIAGNOSIS — R202 Paresthesia of skin: Secondary | ICD-10-CM | POA: Diagnosis not present

## 2016-06-13 DIAGNOSIS — M25561 Pain in right knee: Secondary | ICD-10-CM | POA: Diagnosis not present

## 2016-06-13 DIAGNOSIS — M25512 Pain in left shoulder: Secondary | ICD-10-CM | POA: Diagnosis not present

## 2016-06-13 DIAGNOSIS — M62838 Other muscle spasm: Secondary | ICD-10-CM

## 2016-06-13 DIAGNOSIS — M179 Osteoarthritis of knee, unspecified: Secondary | ICD-10-CM | POA: Diagnosis not present

## 2016-06-13 MED ORDER — CYCLOBENZAPRINE HCL 5 MG PO TABS
2.5000 mg | ORAL_TABLET | Freq: Every evening | ORAL | 3 refills | Status: DC | PRN
Start: 2016-06-13 — End: 2016-06-20

## 2016-06-13 NOTE — Progress Notes (Signed)
[facility] Clinic Note - Initial Visit   Date: 06/13/16  Jasmine [name] MRN: [id number] DOB: 10/13/1955   Dear Hassell Done, Sandria Manly, PA-C:  Thank you for your kind referral of Jasmine [name] for consultation of bilateral leg numbness. Although her history is well known to you, please allow [place] to reiterate it for the purpose of our medical record. The patient was accompanied to the clinic by husband who also provides collateral information.     History of Present Illness: Jasmine [name] is a 61 y.o. left-handed Caucasian female with fibromyalgia, hypertension, hyperlipidemia, left sensorineural hearing loss s/p cochlear implant, TIA, and seizures (off AEDs) and anxiety referred for evaluation of numbness/tingling of the legs.  Patient is confused as to the reason for her referral because she denies having any ongoing symptoms of numbness/tingling of the legs and was under the impression she was referred for management of fibromyalgia.  I apologized and informed her that our office does not manage fibromyalgia or chronic pain.  Review of her epic notes, shows that she was seen by her PCP in February 2018 where she complained of bilateral leg numbness.  She states that was having these symptoms, but they improved.  She is mostly bothered by ongoing issues related to pain, fibromyalgia, and muscle spasms.  Her muscle spasms involve her low back.  She has tried numerous muscle relaxants in the past including flexeril, tizanidine, baclofen, robaxin, and soma without benefit or was unable to tolerate the medication.  She currently takes gabapentin 200-200-300 for fibromyalgia. She has seen Dr. Vertell Limber, neurosurgeon, previously for her low back. CT imaging from 2012 shows moderate disc degeneration and spondylosis C4-5, C5-6, C6-7. MRI lumbar spine from Oct 2017 did not show significant canal stenosis.   She has previously seen Dr. [name] for complex migraines and takes phenergan.  Migraines are well-controlled.  Out-side paper records, electronic medical record, and images have been reviewed where available and summarized as:  Lab Results  Component Value Date   TSH 1.69 12/28/2015   Lab Results  Component Value Date   BOFBPZWC58 527 03/31/2016   Lab Results  Component Value Date   ESRSEDRATE 2 06/28/2007   CT head wo contrast 07/07/2011: Stable exam.  Artifact from the patient's left cochlear implant device obscures large portions of the left supratentorial brain. Within this limitation, no acute intracranial abnormality is evident.  MRI lumbar spine 12/21/2015:  Multilevel facet hypertrophy without significant canal or foraminal stenosis.  Past Medical History:  Diagnosis Date  . Anxiety   . Asthma   . Chronic high back pain   . Double vision   . Fibromyalgia   . Heart murmur   . High cholesterol   . Hypertension   . Hypoglycemia   . Kidney stone   . Meniscus tear    Right  . Migraine 08/07/2015  . PTSD (post-traumatic stress disorder)   . Schizophrenia ([place])    treated by Dr. Casimiro Needle  . Seizures ([place])    last seizure 3 years ago, not on AED  . Stroke ([place]) 07/08/11   "I've had mini strokes before; left side of face  is more down than right "  . Syncope and collapse    One episode - 07/10/15    Past Surgical History:  Procedure Laterality Date  . COCHLEAR IMPLANT  2010   left  . [place]   left  . KIDNEY STONE SURGERY  ~ 2006  . TONSILLECTOMY     "  as a child"  . WISDOM TOOTH EXTRACTION       Medications:  Outpatient Encounter Prescriptions as of 06/13/2016  Medication Sig Note  . ADVAIR HFA 115-21 MCG/ACT inhaler INHALE ONE PUFF BY MOUTH TWICE DAILY 03/31/2016: PRN  . albuterol (PROVENTIL HFA;VENTOLIN HFA) 108 (90 Base) MCG/ACT inhaler Inhale 2 puffs into the lungs every 6 (six) hours as needed for wheezing or shortness of breath.   Marland Kitchen azelastine (ASTELIN) 0.1 % nasal spray Place 1 spray into both nostrils 2 (two)  times daily. Use in each nostril as directed   . fluticasone (FLONASE) 50 MCG/ACT nasal spray USE TWO SPRAY(S) IN EACH NOSTRIL ONCE DAILY   . gabapentin (NEURONTIN) 100 MG capsule TAKE TWO CAPSULES BY MOUTH IN THE MORNING, TWO CAPSULES AT NOON, AND THREE CAPSULES IN THE EVENING   . losartan (COZAAR) 25 MG tablet TAKE ONE TABLET BY MOUTH TWICE DAILY   . metoprolol succinate (TOPROL-XL) 50 MG 24 hr tablet Take by mouth.   . Multiple Vitamin (THERA) TABS Take by mouth. 03/27/2015: Received from: [facility]  . promethazine (PHENERGAN) 12.5 MG tablet Take 1 tablet (12.5 mg total) by mouth every 6 (six) hours as needed for nausea.   . rosuvastatin (CRESTOR) 20 MG tablet TAKE ONE TABLET BY MOUTH ONCE DAILY IN THE EVENING   . temazepam (RESTORIL) 15 MG capsule  02/09/2016: Received from: External Pharmacy  . zolpidem (AMBIEN) 10 MG tablet Take 1 tablet by mouth at bedtime.   . cyclobenzaprine (FLEXERIL) 5 MG tablet Take 0.5-1 tablets (2.5-5 mg total) by mouth at bedtime as needed for muscle spasms.    No facility-administered encounter medications on file as of 06/13/2016.      Allergies:  Allergies  Allergen Reactions  . Sulfa Antibiotics Hives  . Famciclovir Other (See Comments)  . Metronidazole Nausea And Vomiting  . Soma [Carisoprodol] Other (See Comments)    Moody and sleepy Cognitive Function, Daytime sleepiness  . Acyclovir And Related   . Benadryl [Diphenhydramine Hcl]     Severe emotional reaction and doesn't work well with Pt. Past history  . Cephalosporins Hives  . Codeine Other (See Comments)    Makes patient feel odd ; "sometimes mild; sometimes severe reaction; mostly severe"  . Cyclobenzaprine Other (See Comments)    Muscle Aches.  . Diclofenac Sodium Nausea And Vomiting  . Linzess [Linaclotide] Diarrhea  . Nitrofurantoin Monohyd Macro     Felt funny  . Prednisone Other (See Comments)    migraine  . Tizanidine     Other reaction(s): Other (See Comments) insomnia  .  Baclofen Nausea Only  . Meloxicam     Other reaction(s): Confusion  . Sulfamethoxazole Rash    Family History: Family History  Problem Relation Age of Onset  . Heart attack Father 11    Living  . Arthritis Father   . Skin cancer Father   . Hypertension Father   . Hyperlipidemia Father   . Leukemia Mother 60    Deceased  . Alzheimer's disease Paternal Aunt     X2  . Stomach cancer Paternal Aunt     x1  . Arthritis/Rheumatoid Paternal Aunt     x1  . Neuropathy Brother     Peripheal  . Fibromyalgia Sister   . HIV Sister   . Arthritis/Rheumatoid Sister   . Diabetes Paternal Grandmother     Social History: Social History  Substance Use Topics  . Smoking status: Former Smoker    Packs/day: 1.00  Years: 20.00    Types: Cigarettes    Quit date: 02/29/2004  . Smokeless tobacco: Never Used  . Alcohol use 0.0 oz/week     Comment: "glass of wine q once in awhile; not very often; do it on special occasion"   Social History   Social History Narrative   Completed 2 years of college.    On disability.   Lives at home with her husband.   No caffeine use.   Left-handed.   Three children, 2 biological - 1 adopted.    Review of Systems:  CONSTITUTIONAL: No fevers, chills, night sweats, or weight loss.   EYES: No visual changes or eye pain ENT: No hearing changes.  No history of nose bleeds.   RESPIRATORY: No cough, wheezing and shortness of breath.   CARDIOVASCULAR: Negative for chest pain, and palpitations.   GI: Negative for abdominal discomfort, blood in stools or black stools.  No recent change in bowel habits.   GU:  No history of incontinence.   MUSCLOSKELETAL: +history of joint pain or swelling.  +myalgias.   SKIN: Negative for lesions, rash, and itching.   HEMATOLOGY/ONCOLOGY: Negative for prolonged bleeding, bruising easily, and swollen nodes.  No history of cancer.   ENDOCRINE: Negative for cold or heat intolerance, polydipsia or goiter.   PSYCH:  +depression  or anxiety symptoms.   NEURO: As Above.   Vital Signs:  BP 120/78   Pulse 96   Ht 5\' 4"  (1.626 m)   Wt 187 lb 4 oz (84.9 kg)   SpO2 96%   BMI 32.14 kg/m   General Medical Exam:   General:  Well appearing, comfortable.   Eyes/ENT: see cranial nerve examination.   Neck: No masses appreciated.  Full range of motion without tenderness.  No carotid bruits. Respiratory:  Clear to auscultation, good air entry bilaterally.   Cardiac:  Regular rate and rhythm, no murmur.   Extremities:  No deformities, edema, or skin discoloration.  Skin:  No rashes or lesions.  Neurological Exam: MENTAL STATUS including orientation to time, place, person, recent and remote memory, attention span and concentration, language, and fund of knowledge is normal.  Speech is not dysarthric.  CRANIAL NERVES: II:  No visual field defects.  Unremarkable fundi.   III-IV-VI: Pupils equal round and reactive to light.  Normal conjugate, extra-ocular eye movements in all directions of gaze.  No nystagmus.  No ptosis.   V:  Normal facial sensation.    VII:  Mild right facial asymmetry and movements.   VIII:  Reduced hearing and vestibular function.   IX-X:  Normal palatal movement.   XI:  Normal shoulder shrug and head rotation.   XII:  Normal tongue strength and range of motion, no deviation or fasciculation.  MOTOR: Motor strength is 5/5 throughout.   No atrophy, fasciculations or abnormal movements.  No pronator drift.  Tone is normal.    MSRs:  Reflexes are brisk and symmetric 3+/4, except 2+/4 bilateral Achilles.  Plantars are down going.  No ankle clonus.  SENSORY:  Vibration is mildly reduced at the right great toe on the right only.  Otherwise, normal and symmetric perception of light touch, pinprick, and proprioception.   COORDINATION/GAIT: Normal finger-to- nose-finger.  Intact rapid alternating movements bilaterally.  Able to rise from a chair without using arms.  Gait wide-based and antaglic.     IMPRESSION: 1.  Bilateral leg paresthesias - resolved. NCS/EMG offered, if symptoms return. 2.  Chronic muscle spasm.  She would  like to retry flexeril at at low dose to minimize side effects of sedation which she previously experienced.  Start flexeril 2.5mg  at bedtime, if tolerating increase to 5mg  at bedtime 3.  Fibromyalgia - patient informed that our office does not manage fibromyalgia or chronic pain, she may be better suited to see pain management 4.  Hyperreflexia likely due to cervical canal stenosis, clinically asymptomatic - denies weakness or paresthesias.   Return to clinic as needed   The duration of this appointment visit was 45 minutes of face-to-face time with the patient.  Greater than 50% of this time was spent in counseling, explanation of diagnosis, planning of further management, and coordination of care.   Thank you for allowing me to participate in patient's care.  If I can answer any additional questions, I would be pleased to do so.    Sincerely,    Kenzey Birkland K. Posey Pronto, DO

## 2016-06-13 NOTE — Patient Instructions (Signed)
1.  Start flexeril 2.5mg  at bedtime, if this does not make you too sleepy, you can increase to 5mg  at bedtime as needed 2.  Start water exercises 3.  If your numbness/tingling returns, please call my office to schedule nerve testing and needle electrode testing

## 2016-06-14 ENCOUNTER — Ambulatory Visit (INDEPENDENT_AMBULATORY_CARE_PROVIDER_SITE_OTHER): Payer: 59 | Admitting: Physician Assistant

## 2016-06-14 ENCOUNTER — Encounter: Payer: Self-pay | Admitting: Physician Assistant

## 2016-06-14 VITALS — BP 122/80 | HR 60 | Temp 98.3°F | Resp 14 | Ht 64.0 in | Wt 185.0 lb

## 2016-06-14 DIAGNOSIS — E669 Obesity, unspecified: Secondary | ICD-10-CM

## 2016-06-14 DIAGNOSIS — Z6831 Body mass index (BMI) 31.0-31.9, adult: Secondary | ICD-10-CM

## 2016-06-14 LAB — TSH: TSH: 2.76 u[IU]/mL (ref 0.35–4.50)

## 2016-06-14 NOTE — Progress Notes (Signed)
Pre visit review using our clinic review tool, if applicable. No additional management support is needed unless otherwise documented below in the visit note. 

## 2016-06-14 NOTE — Progress Notes (Signed)
Patient presents to clinic today c/o continual weight gain over the past couple of months. Endorses is eating more than she used to but notes a well-balanced diet. States her problem is snacking -- eating 1/2 bag of chips per day. Does drink sodas often. Patient notes she has become more sedentary over the past few months. States she has decreased motivation due to pain from fibromyalgia. Is taking Gabapentin at recently increased dose with improvement in pain. Denies increase in weight gain since medication changes. Patient without history of hypothyroidism. Endorses some occasional constipation and dryness of skin. Is sleeping 7-8 hours nightly. Notes restful sleep. Endorses stable mood overall.  Body mass index is 31.76 kg/m.   Past Medical History:  Diagnosis Date  . Anxiety   . Asthma   . Chronic high back pain   . Double vision   . Fibromyalgia   . Heart murmur   . High cholesterol   . Hypertension   . Hypoglycemia   . Kidney stone   . Meniscus tear    Right  . Migraine 08/07/2015  . PTSD (post-traumatic stress disorder)   . Schizophrenia ([place])    treated by Dr. Casimiro Needle  . Seizures ([place])    last seizure 3 years ago, not on AED  . Stroke ([place]) 07/08/11   "I've had mini strokes before; left side of face  is more down than right "  . Syncope and collapse    One episode - 07/10/15    Current Outpatient Prescriptions on File Prior to Visit  Medication Sig Dispense Refill  . ADVAIR HFA 115-21 MCG/ACT inhaler INHALE ONE PUFF BY MOUTH TWICE DAILY 12 g 5  . albuterol (PROVENTIL HFA;VENTOLIN HFA) 108 (90 Base) MCG/ACT inhaler Inhale 2 puffs into the lungs every 6 (six) hours as needed for wheezing or shortness of breath. 1 Inhaler 2  . azelastine (ASTELIN) 0.1 % nasal spray Place 1 spray into both nostrils 2 (two) times daily. Use in each nostril as directed 30 mL 12  . cyclobenzaprine (FLEXERIL) 5 MG tablet Take 0.5-1 tablets (2.5-5 mg total) by mouth at bedtime as needed for muscle  spasms. 30 tablet 3  . fluticasone (FLONASE) 50 MCG/ACT nasal spray USE TWO SPRAY(S) IN EACH NOSTRIL ONCE DAILY 16 g 5  . gabapentin (NEURONTIN) 100 MG capsule TAKE TWO CAPSULES BY MOUTH IN THE MORNING, TWO CAPSULES AT NOON, AND THREE CAPSULES IN THE EVENING 210 capsule 3  . losartan (COZAAR) 25 MG tablet TAKE ONE TABLET BY MOUTH TWICE DAILY 60 tablet 5  . Multiple Vitamin (THERA) TABS Take by mouth.    . promethazine (PHENERGAN) 12.5 MG tablet Take 1 tablet (12.5 mg total) by mouth every 6 (six) hours as needed for nausea. 20 tablet 0  . rosuvastatin (CRESTOR) 20 MG tablet TAKE ONE TABLET BY MOUTH ONCE DAILY IN THE EVENING 90 tablet 1  . temazepam (RESTORIL) 15 MG capsule Take 2 tablets at bedtime for sleep    . zolpidem (AMBIEN) 10 MG tablet Take 1 tablet by mouth at bedtime.     No current facility-administered medications on file prior to visit.    Allergies  Allergen Reactions  . Sulfa Antibiotics Hives  . Famciclovir Other (See Comments)  . Metronidazole Nausea And Vomiting  . Soma [Carisoprodol] Other (See Comments)    Moody and sleepy Cognitive Function, Daytime sleepiness  . Acyclovir And Related   . Benadryl [Diphenhydramine Hcl]     Severe emotional reaction and doesn't work well with  Pt. Past history  . Cephalosporins Hives  . Codeine Other (See Comments)    Makes patient feel odd ; "sometimes mild; sometimes severe reaction; mostly severe"  . Cyclobenzaprine Other (See Comments)    Muscle Aches.  . Diclofenac Sodium Nausea And Vomiting  . Linzess [Linaclotide] Diarrhea  . Nitrofurantoin Monohyd Macro     Felt funny  . Prednisone Other (See Comments)    migraine  . Tizanidine     Other reaction(s): Other (See Comments) insomnia  . Baclofen Nausea Only  . Meloxicam     Other reaction(s): Confusion  . Sulfamethoxazole Rash    Family History  Problem Relation Age of Onset  . Heart attack Father 88    Living  . Arthritis Father   . Skin cancer Father   .  Hypertension Father   . Hyperlipidemia Father   . Leukemia Mother 71    Deceased  . Alzheimer's disease Paternal Aunt     X2  . Stomach cancer Paternal Aunt     x1  . Arthritis/Rheumatoid Paternal Aunt     x1  . Neuropathy Brother     Peripheal  . Fibromyalgia Sister   . HIV Sister   . Arthritis/Rheumatoid Sister   . Diabetes Paternal Grandmother     Social History   Social History  . Marital status: Married    Spouse name: N/A  . Number of children: 3  . Years of education: 2 yrs coll   Occupational History  . Disabled    Social History Main Topics  . Smoking status: Former Smoker    Packs/day: 1.00    Years: 20.00    Types: Cigarettes    Quit date: 02/29/2004  . Smokeless tobacco: Never Used  . Alcohol use 0.0 oz/week     Comment: "glass of wine q once in awhile; not very often; do it on special occasion"  . Drug use: No  . Sexual activity: No   Other Topics Concern  . None   Social History Narrative   Completed 2 years of college.    On disability.   Lives at home with her husband.   No caffeine use.   Left-handed.   Three children, 2 biological - 1 adopted.   Review of Systems - See HPI.  All other ROS are negative.  BP 122/80   Pulse 60   Temp 98.3 F (36.8 C) (Oral)   Resp 14   Ht 5\' 4"  (1.626 m)   Wt 185 lb (83.9 kg)   SpO2 98%   BMI 31.76 kg/m   Physical Exam  Constitutional: She is oriented to person, place, and time and well-developed, well-nourished, and in no distress.  HENT:  Head: Normocephalic and atraumatic.  Eyes: Conjunctivae are normal.  Neck: Neck supple. No thyromegaly present.  Cardiovascular: Normal rate, regular rhythm, normal heart sounds and intact distal pulses.   Pulmonary/Chest: Effort normal and breath sounds normal. No respiratory distress. She has no wheezes. She has no rales. She exhibits no tenderness.  Neurological: She is alert and oriented to person, place, and time.  Skin: Skin is warm and dry. No rash  noted.  Psychiatric: Affect normal.  Vitals reviewed.  Recent Results (from the past 2160 hour(s))  B12     Status: None   Collection Time: 03/31/16  9:36 AM  Result Value Ref Range   Vitamin B-12 773 211 - 911 pg/mL   Assessment/Plan: 1. Class 1 obesity without serious comorbidity with body mass index (  BMI) of 31.0 to 31.9 in adult, unspecified obesity type Multifactorial -- diet and sedentary lifestyle major contributors. Exercise is limited due to fibromyalgia. Discussed water aerobics as a good option. Dietary recommendations given. Handout given on calorie counting and meal planning. Goal 1500-1700 calories at present. Will obtain labs panel today. Will alter regimen based on findings.  - TSH - T4, free - Vitamin D 1,25 dihydroxy   Leeanne Rio, PA-C

## 2016-06-14 NOTE — Patient Instructions (Signed)
Please go to the lab for blood work. I will call you with your results.   Please work on portion sizes and snacking between meals. I highly encourage you to start water aerobics as this will help with joints, weight and mood.    Calorie Counting for Weight Loss Calories are units of energy. Your body needs a certain amount of calories from food to keep you going throughout the day. When you eat more calories than your body needs, your body stores the extra calories as fat. When you eat fewer calories than your body needs, your body burns fat to get the energy it needs. Calorie counting means keeping track of how many calories you eat and drink each day. Calorie counting can be helpful if you need to lose weight. If you make sure to eat fewer calories than your body needs, you should lose weight. Ask your health care provider what a healthy weight is for you. For calorie counting to work, you will need to eat the right number of calories in a day in order to lose a healthy amount of weight per week. A dietitian can help you determine how many calories you need in a day and will give you suggestions on how to reach your calorie goal.  A healthy amount of weight to lose per week is usually 1-2 lb (0.5-0.9 kg). This usually means that your daily calorie intake should be reduced by 500-750 calories.  Eating 1,200 - 1,500 calories per day can help most women lose weight.  Eating 1,500 - 1,800 calories per day can help most men lose weight. What is my plan? My goal is to have __1500__ calories per day. If I have this many calories per day, I should lose around __________ pounds per week. What do I need to know about calorie counting? In order to meet your daily calorie goal, you will need to:  Find out how many calories are in each food you would like to eat. Try to do this before you eat.  Decide how much of the food you plan to eat.  Write down what you ate and how many calories it had. Doing  this is called keeping a food log. To successfully lose weight, it is important to balance calorie counting with a healthy lifestyle that includes regular activity. Aim for 150 minutes of moderate exercise (such as walking) or 75 minutes of vigorous exercise (such as running) each week. Where do I find calorie information?   The number of calories in a food can be found on a Nutrition Facts label. If a food does not have a Nutrition Facts label, try to look up the calories online or ask your dietitian for help. Remember that calories are listed per serving. If you choose to have more than one serving of a food, you will have to multiply the calories per serving by the amount of servings you plan to eat. For example, the label on a package of bread might say that a serving size is 1 slice and that there are 90 calories in a serving. If you eat 1 slice, you will have eaten 90 calories. If you eat 2 slices, you will have eaten 180 calories. How do I keep a food log? Immediately after each meal, record the following information in your food log:  What you ate. Don't forget to include toppings, sauces, and other extras on the food.  How much you ate. This can be measured in cups, ounces, or  number of items.  How many calories each food and drink had.  The total number of calories in the meal. Keep your food log near you, such as in a small notebook in your pocket, or use a mobile app or website. Some programs will calculate calories for you and show you how many calories you have left for the day to meet your goal. What are some calorie counting tips?  Use your calories on foods and drinks that will fill you up and not leave you hungry:  Some examples of foods that fill you up are nuts and nut butters, vegetables, lean proteins, and high-fiber foods like whole grains. High-fiber foods are foods with more than 5 g fiber per serving.  Drinks such as sodas, specialty coffee drinks, alcohol, and juices  have a lot of calories, yet do not fill you up.  Eat nutritious foods and avoid empty calories. Empty calories are calories you get from foods or beverages that do not have many vitamins or protein, such as candy, sweets, and soda. It is better to have a nutritious high-calorie food (such as an avocado) than a food with few nutrients (such as a bag of chips).  Know how many calories are in the foods you eat most often. This will help you calculate calorie counts faster.  Pay attention to calories in drinks. Low-calorie drinks include water and unsweetened drinks.  Pay attention to nutrition labels for "low fat" or "fat free" foods. These foods sometimes have the same amount of calories or more calories than the full fat versions. They also often have added sugar, starch, or salt, to make up for flavor that was removed with the fat.  Find a way of tracking calories that works for you. Get creative. Try different apps or programs if writing down calories does not work for you. What are some portion control tips?  Know how many calories are in a serving. This will help you know how many servings of a certain food you can have.  Use a measuring cup to measure serving sizes. You could also try weighing out portions on a kitchen scale. With time, you will be able to estimate serving sizes for some foods.  Take some time to put servings of different foods on your favorite plates, bowls, and cups so you know what a serving looks like.  Try not to eat straight from a bag or box. Doing this can lead to overeating. Put the amount you would like to eat in a cup or on a plate to make sure you are eating the right portion.  Use smaller plates, glasses, and bowls to prevent overeating.  Try not to multitask (for example, watch TV or use your computer) while eating. If it is time to eat, sit down at a table and enjoy your food. This will help you to know when you are full. It will also help you to be aware of  what you are eating and how much you are eating. What are tips for following this plan? Reading food labels   Check the calorie count compared to the serving size. The serving size may be smaller than what you are used to eating.  Check the source of the calories. Make sure the food you are eating is high in vitamins and protein and low in saturated and trans fats. Shopping   Read nutrition labels while you shop. This will help you make healthy decisions before you decide to purchase your food.  Make a grocery list and stick to it. Cooking   Try to cook your favorite foods in a healthier way. For example, try baking instead of frying.  Use low-fat dairy products. Meal planning   Use more fruits and vegetables. Half of your plate should be fruits and vegetables.  Include lean proteins like poultry and fish. How do I count calories when eating out?  Ask for smaller portion sizes.  Consider sharing an entree and sides instead of getting your own entree.  If you get your own entree, eat only half. Ask for a box at the beginning of your meal and put the rest of your entree in it so you are not tempted to eat it.  If calories are listed on the menu, choose the lower calorie options.  Choose dishes that include vegetables, fruits, whole grains, low-fat dairy products, and lean protein.  Choose items that are boiled, broiled, grilled, or steamed. Stay away from items that are buttered, battered, fried, or served with cream sauce. Items labeled "crispy" are usually fried, unless stated otherwise.  Choose water, low-fat milk, unsweetened iced tea, or other drinks without added sugar. If you want an alcoholic beverage, choose a lower calorie option such as a glass of wine or light beer.  Ask for dressings, sauces, and syrups on the side. These are usually high in calories, so you should limit the amount you eat.  If you want a salad, choose a garden salad and ask for grilled meats. Avoid  extra toppings like bacon, cheese, or fried items. Ask for the dressing on the side, or ask for olive oil and vinegar or lemon to use as dressing.  Estimate how many servings of a food you are given. For example, a serving of cooked rice is  cup or about the size of half a baseball. Knowing serving sizes will help you be aware of how much food you are eating at restaurants. The list below tells you how big or small some common portion sizes are based on everyday objects:  1 oz-4 stacked dice.  3 oz-1 deck of cards.  1 tsp-1 die.  1 Tbsp- a ping-pong ball.  2 Tbsp-1 ping-pong ball.   cup- baseball.  1 cup-1 baseball. Summary  Calorie counting means keeping track of how many calories you eat and drink each day. If you eat fewer calories than your body needs, you should lose weight.  A healthy amount of weight to lose per week is usually 1-2 lb (0.5-0.9 kg). This usually means reducing your daily calorie intake by 500-750 calories.  The number of calories in a food can be found on a Nutrition Facts label. If a food does not have a Nutrition Facts label, try to look up the calories online or ask your dietitian for help.  Use your calories on foods and drinks that will fill you up, and not on foods and drinks that will leave you hungry.  Use smaller plates, glasses, and bowls to prevent overeating. This information is not intended to replace advice given to you by your health care provider. Make sure you discuss any questions you have with your health care provider. Document Released: 02/14/2005 Document Revised: 01/15/2016 Document Reviewed: 01/15/2016 Elsevier Interactive Patient Education  2017 [employer].

## 2016-06-15 DIAGNOSIS — M9906 Segmental and somatic dysfunction of lower extremity: Secondary | ICD-10-CM | POA: Diagnosis not present

## 2016-06-15 DIAGNOSIS — M25561 Pain in right knee: Secondary | ICD-10-CM | POA: Diagnosis not present

## 2016-06-15 LAB — T4, FREE: Free T4: 0.88 ng/dL (ref 0.60–1.60)

## 2016-06-16 ENCOUNTER — Telehealth: Payer: Self-pay | Admitting: Physician Assistant

## 2016-06-16 DIAGNOSIS — M25512 Pain in left shoulder: Secondary | ICD-10-CM | POA: Diagnosis not present

## 2016-06-16 DIAGNOSIS — M179 Osteoarthritis of knee, unspecified: Secondary | ICD-10-CM | POA: Diagnosis not present

## 2016-06-16 DIAGNOSIS — M25561 Pain in right knee: Secondary | ICD-10-CM | POA: Diagnosis not present

## 2016-06-16 NOTE — Telephone Encounter (Signed)
Patient returning call, requesting to speak with Jasmine [name].  I advised patient Jasmine [name] was with a patient and I will have her call her back when she is available.

## 2016-06-16 NOTE — Telephone Encounter (Signed)
LMOVM advising patient that I verified with [facility] Patient filled Gabapentin 100 mg on 05/27/16 #210.  Called patient to verify how she is taking current rx. We can authorize to fill sooner but her insurance my not cover to be refilled early.

## 2016-06-16 NOTE — Telephone Encounter (Signed)
Spoke with patient and she states she only received #150 pills from the pharmacy.  Spoke with the pharmacist and had the pharmacy tech to count the Gabapentin.  They agreed to give her #60 more pills to last until closer time to get her refill. Patient is agreeable.

## 2016-06-16 NOTE — Telephone Encounter (Signed)
Voicemail message received on phone at front desk on 09/14/16 at 6:49pm.   Patient states she only has 6 pills remaining of her gabapentin (NEURONTIN) 100 MG capsule.  She thinks the script was only written for # 150 and not #210 as it should have been.  Patient requesting call back to discuss.

## 2016-06-17 ENCOUNTER — Ambulatory Visit (INDEPENDENT_AMBULATORY_CARE_PROVIDER_SITE_OTHER): Payer: 59 | Admitting: Family Medicine

## 2016-06-17 ENCOUNTER — Encounter: Payer: Self-pay | Admitting: Family Medicine

## 2016-06-17 DIAGNOSIS — M25512 Pain in left shoulder: Secondary | ICD-10-CM | POA: Diagnosis not present

## 2016-06-17 DIAGNOSIS — Z87898 Personal history of other specified conditions: Secondary | ICD-10-CM | POA: Diagnosis not present

## 2016-06-17 LAB — VITAMIN D 1,25 DIHYDROXY
Vitamin D 1, 25 (OH)2 Total: 34 pg/mL (ref 18–72)
Vitamin D2 1, 25 (OH)2: <8 pg/mL
Vitamin D3 1, 25 (OH)2: 34 pg/mL

## 2016-06-17 NOTE — Patient Instructions (Signed)
Do home exercises 2-3 times a week for 6 more weeks (consider doing them on your right side too). Follow up with me as needed otherwise.

## 2016-06-19 NOTE — Progress Notes (Signed)
PCP and consultation requested by: Leeanne Rio, PA-C  Subjective:   HPI: Patient is a 61 y.o. female here for left shoulder pain.  3/8: Patient reports she's had about 3 months of left shoulder pain. No known injury or trauma. Pain developed insidiously lateral upper arm and anterior aspect of shoulder. Pain up to 5/10 and worse reaching overhead, sharp. Takes gabapentin but no other medications for this. No night pain. No skin changes, numbness. Is left handed.  4/20: Patient reports she feels better. Pain level is 0/10. Doing home exercises now especially in the pool. No skin changes, numbness, swelling.  Past Medical History:  Diagnosis Date  . Anxiety   . Asthma   . Chronic high back pain   . Double vision   . Fibromyalgia   . Heart murmur   . High cholesterol   . Hypertension   . Hypoglycemia   . Kidney stone   . Meniscus tear    Right  . Migraine 08/07/2015  . PTSD (post-traumatic stress disorder)   . Schizophrenia ([place])    treated by Dr. Casimiro Needle  . Seizures ([place])    last seizure 3 years ago, not on AED  . Stroke ([place]) 07/08/11   "I've had mini strokes before; left side of face  is more down than right "  . Syncope and collapse    One episode - 07/10/15    Current Outpatient Prescriptions on File Prior to Visit  Medication Sig Dispense Refill  . ADVAIR HFA 115-21 MCG/ACT inhaler INHALE ONE PUFF BY MOUTH TWICE DAILY 12 g 5  . albuterol (PROVENTIL HFA;VENTOLIN HFA) 108 (90 Base) MCG/ACT inhaler Inhale 2 puffs into the lungs every 6 (six) hours as needed for wheezing or shortness of breath. 1 Inhaler 2  . azelastine (ASTELIN) 0.1 % nasal spray Place 1 spray into both nostrils 2 (two) times daily. Use in each nostril as directed 30 mL 12  . cyclobenzaprine (FLEXERIL) 5 MG tablet Take 0.5-1 tablets (2.5-5 mg total) by mouth at bedtime as needed for muscle spasms. 30 tablet 3  . fluticasone (FLONASE) 50 MCG/ACT nasal spray USE TWO SPRAY(S) IN EACH NOSTRIL  ONCE DAILY 16 g 5  . gabapentin (NEURONTIN) 100 MG capsule TAKE TWO CAPSULES BY MOUTH IN THE MORNING, TWO CAPSULES AT NOON, AND THREE CAPSULES IN THE EVENING 210 capsule 3  . losartan (COZAAR) 25 MG tablet TAKE ONE TABLET BY MOUTH TWICE DAILY 60 tablet 5  . metoprolol succinate (TOPROL-XL) 25 MG 24 hr tablet Take 75 mg by mouth daily. Take 3 tablets daily (75mg  )    . Multiple Vitamin (THERA) TABS Take by mouth.    . promethazine (PHENERGAN) 12.5 MG tablet Take 1 tablet (12.5 mg total) by mouth every 6 (six) hours as needed for nausea. 20 tablet 0  . rosuvastatin (CRESTOR) 20 MG tablet TAKE ONE TABLET BY MOUTH ONCE DAILY IN THE EVENING 90 tablet 1  . temazepam (RESTORIL) 15 MG capsule Take 2 tablets at bedtime for sleep    . zolpidem (AMBIEN) 10 MG tablet Take 1 tablet by mouth at bedtime.     No current facility-administered medications on file prior to visit.     Past Surgical History:  Procedure Laterality Date  . COCHLEAR IMPLANT  2010   left  . [place]   left  . KIDNEY STONE SURGERY  ~ 2006  . TONSILLECTOMY     "as a child"  . WISDOM TOOTH EXTRACTION  Allergies  Allergen Reactions  . Sulfa Antibiotics Hives  . Famciclovir Other (See Comments)  . Metronidazole Nausea And Vomiting  . Soma [Carisoprodol] Other (See Comments)    Moody and sleepy Cognitive Function, Daytime sleepiness  . Acyclovir And Related   . Benadryl [Diphenhydramine Hcl]     Severe emotional reaction and doesn't work well with Pt. Past history  . Cephalosporins Hives  . Codeine Other (See Comments)    Makes patient feel odd ; "sometimes mild; sometimes severe reaction; mostly severe"  . Cyclobenzaprine Other (See Comments)    Muscle Aches.  . Diclofenac Sodium Nausea And Vomiting  . Linzess [Linaclotide] Diarrhea  . Nitrofurantoin Monohyd Macro     Felt funny  . Prednisone Other (See Comments)    migraine  . Tizanidine     Other reaction(s): Other (See Comments) insomnia   . Baclofen Nausea Only  . Meloxicam     Other reaction(s): Confusion  . Sulfamethoxazole Rash    Social History   Social History  . Marital status: Married    Spouse name: N/A  . Number of children: 3  . Years of education: 2 yrs coll   Occupational History  . Disabled    Social History Main Topics  . Smoking status: Former Smoker    Packs/day: 1.00    Years: 20.00    Types: Cigarettes    Quit date: 02/29/2004  . Smokeless tobacco: Never Used  . Alcohol use 0.0 oz/week     Comment: "glass of wine q once in awhile; not very often; do it on special occasion"  . Drug use: No  . Sexual activity: No   Other Topics Concern  . Not on file   Social History Narrative   Completed 2 years of college.    On disability.   Lives at home with her husband.   No caffeine use.   Left-handed.   Three children, 2 biological - 1 adopted.    Family History  Problem Relation Age of Onset  . Heart attack Father 14    Living  . Arthritis Father   . Skin cancer Father   . Hypertension Father   . Hyperlipidemia Father   . Leukemia Mother 14    Deceased  . Alzheimer's disease Paternal Aunt     X2  . Stomach cancer Paternal Aunt     x1  . Arthritis/Rheumatoid Paternal Aunt     x1  . Neuropathy Brother     Peripheal  . Fibromyalgia Sister   . HIV Sister   . Arthritis/Rheumatoid Sister   . Diabetes Paternal Grandmother     BP 116/80   Pulse 67   Ht 5\' 4"  (1.626 m)   Wt 175 lb (79.4 kg)   BMI 30.04 kg/m   Review of Systems: See HPI above.     Objective:  Physical Exam:  Gen: NAD, comfortable in exam room  Left shoulder: No swelling, ecchymoses.  No gross deformity. Minimal TTP lateral shoulder.  No other tenderness. FROM with negative painful arc. Negative Hawkins, Neers. Negative Yergasons. Strength 5/5 with empty can and resisted internal/external rotation.   NV intact distally.  Right shoulder: FROM without pain.   Assessment & Plan:  1. Left shoulder  pain - 2/2 rotator cuff impingement.  Improved following injection, home exercises.  Encouraged home exercises 2-3 times a week for 6 more weeks.  Tylenol if needed for pain.  F/u prn.

## 2016-06-19 NOTE — Assessment & Plan Note (Signed)
2/2 rotator cuff impingement.  Improved following injection, home exercises.  Encouraged home exercises 2-3 times a week for 6 more weeks.  Tylenol if needed for pain.  F/u prn.

## 2016-06-20 ENCOUNTER — Ambulatory Visit (INDEPENDENT_AMBULATORY_CARE_PROVIDER_SITE_OTHER): Payer: 59 | Admitting: Physician Assistant

## 2016-06-20 ENCOUNTER — Encounter: Payer: Self-pay | Admitting: Physician Assistant

## 2016-06-20 VITALS — BP 130/90 | HR 64 | Temp 98.1°F | Resp 14 | Ht 64.0 in | Wt 186.0 lb

## 2016-06-20 DIAGNOSIS — G44209 Tension-type headache, unspecified, not intractable: Secondary | ICD-10-CM | POA: Diagnosis not present

## 2016-06-20 MED ORDER — KETOROLAC TROMETHAMINE 60 MG/2ML IM SOLN
60.0000 mg | Freq: Once | INTRAMUSCULAR | Status: AC
  Administered 2016-06-20: 30 mg via INTRAMUSCULAR

## 2016-06-20 MED ORDER — DIAZEPAM 2 MG PO TABS: 2.0000 mg | ORAL_TABLET | Freq: Three times a day (TID) | ORAL | 0 refills | Status: DC | PRN

## 2016-06-20 NOTE — Progress Notes (Signed)
Patient presents to clinic today c/o flare-up of cervical neck pain along with headache described as tightness in posterior head, neck and upper shoulders. Denies increased stressors. Denies photophobia or phonophobia. Some mild nausea. Denies vision changes. Patient with history of chronic migraines and tension headaches, followed by Neurology. Has taken several OTC medications without significant relief.   Past Medical History:  Diagnosis Date  . Anxiety   . Asthma   . Chronic high back pain   . Double vision   . Fibromyalgia   . Heart murmur   . High cholesterol   . Hypertension   . Hypoglycemia   . Kidney stone   . Meniscus tear    Right  . Migraine 08/07/2015  . PTSD (post-traumatic stress disorder)   . Schizophrenia ([place])    treated by Dr. Casimiro Needle  . Seizures ([place])    last seizure 3 years ago, not on AED  . Stroke ([place]) 07/08/11   "I've had mini strokes before; left side of face  is more down than right "  . Syncope and collapse    One episode - 07/10/15    Current Outpatient Prescriptions on File Prior to Visit  Medication Sig Dispense Refill  . ADVAIR HFA 115-21 MCG/ACT inhaler INHALE ONE PUFF BY MOUTH TWICE DAILY 12 g 5  . albuterol (PROVENTIL HFA;VENTOLIN HFA) 108 (90 Base) MCG/ACT inhaler Inhale 2 puffs into the lungs every 6 (six) hours as needed for wheezing or shortness of breath. 1 Inhaler 2  . azelastine (ASTELIN) 0.1 % nasal spray Place 1 spray into both nostrils 2 (two) times daily. Use in each nostril as directed 30 mL 12  . fluticasone (FLONASE) 50 MCG/ACT nasal spray USE TWO SPRAY(S) IN EACH NOSTRIL ONCE DAILY 16 g 5  . gabapentin (NEURONTIN) 100 MG capsule TAKE TWO CAPSULES BY MOUTH IN THE MORNING, TWO CAPSULES AT NOON, AND THREE CAPSULES IN THE EVENING 210 capsule 3  . losartan (COZAAR) 25 MG tablet TAKE ONE TABLET BY MOUTH TWICE DAILY 60 tablet 5  . metoprolol succinate (TOPROL-XL) 25 MG 24 hr tablet Take 75 mg by mouth daily. Take 3 tablets daily (75mg  )     . Multiple Vitamin (THERA) TABS Take by mouth.    . promethazine (PHENERGAN) 12.5 MG tablet Take 1 tablet (12.5 mg total) by mouth every 6 (six) hours as needed for nausea. 20 tablet 0  . rosuvastatin (CRESTOR) 20 MG tablet TAKE ONE TABLET BY MOUTH ONCE DAILY IN THE EVENING 90 tablet 1  . temazepam (RESTORIL) 15 MG capsule Take 2 tablets at bedtime for sleep    . zolpidem (AMBIEN) 10 MG tablet Take 1 tablet by mouth at bedtime.     No current facility-administered medications on file prior to visit.     Allergies  Allergen Reactions  . Sulfa Antibiotics Hives  . Famciclovir Other (See Comments)  . Metronidazole Nausea And Vomiting  . Soma [Carisoprodol] Other (See Comments)    Moody and sleepy Cognitive Function, Daytime sleepiness  . Acyclovir And Related   . Benadryl [Diphenhydramine Hcl]     Severe emotional reaction and doesn't work well with Pt. Past history  . Cephalosporins Hives  . Codeine Other (See Comments)    Makes patient feel odd ; "sometimes mild; sometimes severe reaction; mostly severe"  . Cyclobenzaprine Other (See Comments)    Muscle Aches.  . Diclofenac Sodium Nausea And Vomiting  . Linzess [Linaclotide] Diarrhea  . Nitrofurantoin Monohyd Macro     Felt  funny  . Prednisone Other (See Comments)    migraine  . Tizanidine     Other reaction(s): Other (See Comments) insomnia  . Baclofen Nausea Only  . Meloxicam     Other reaction(s): Confusion  . Sulfamethoxazole Rash    Family History  Problem Relation Age of Onset  . Heart attack Father 60    Living  . Arthritis Father   . Skin cancer Father   . Hypertension Father   . Hyperlipidemia Father   . Leukemia Mother 85    Deceased  . Alzheimer's disease Paternal Aunt     X2  . Stomach cancer Paternal Aunt     x1  . Arthritis/Rheumatoid Paternal Aunt     x1  . Neuropathy Brother     Peripheal  . Fibromyalgia Sister   . HIV Sister   . Arthritis/Rheumatoid Sister   . Diabetes Paternal  Grandmother     Social History   Social History  . Marital status: Married    Spouse name: N/A  . Number of children: 3  . Years of education: 2 yrs coll   Occupational History  . Disabled    Social History Main Topics  . Smoking status: Former Smoker    Packs/day: 1.00    Years: 20.00    Types: Cigarettes    Quit date: 02/29/2004  . Smokeless tobacco: Never Used  . Alcohol use 0.0 oz/week     Comment: "glass of wine q once in awhile; not very often; do it on special occasion"  . Drug use: No  . Sexual activity: No   Other Topics Concern  . Not on file   Social History Narrative   Completed 2 years of college.    On disability.   Lives at home with her husband.   No caffeine use.   Left-handed.   Three children, 2 biological - 1 adopted.    Review of Systems - See HPI.  All other ROS are negative.  There were no vitals taken for this visit.  Physical Exam  Constitutional: She is oriented to person, place, and time and well-developed, well-nourished, and in no distress.  HENT:  Head: Normocephalic and atraumatic.  Eyes: Conjunctivae are normal.  Neck: Neck supple. Muscular tenderness present. No spinous process tenderness present.  Cardiovascular: Normal rate, regular rhythm, normal heart sounds and intact distal pulses.   Pulmonary/Chest: Effort normal and breath sounds normal. No respiratory distress. She has no wheezes. She has no rales. She exhibits no tenderness.  Musculoskeletal:       Cervical back: She exhibits tenderness.  Neurological: She is alert and oriented to person, place, and time. No cranial nerve deficit.  Skin: Skin is warm and dry. No rash noted.  Psychiatric: Affect normal.  Vitals reviewed.   Recent Results (from the past 2160 hour(s))  B12     Status: None   Collection Time: 03/31/16  9:36 AM  Result Value Ref Range   Vitamin B-12 773 211 - 911 pg/mL  TSH     Status: None   Collection Time: 06/14/16  9:31 AM  Result Value Ref Range    TSH 2.76 0.35 - 4.50 uIU/mL  T4, free     Status: None   Collection Time: 06/14/16  9:31 AM  Result Value Ref Range   Free T4 0.88 0.60 - 1.60 ng/dL    Comment: Specimens from patients who are undergoing biotin therapy and /or ingesting biotin supplements may contain high levels of biotin.  The higher biotin concentration in these specimens interferes with this Free T4 assay.  Specimens that contain high levels  of biotin may cause false high results for this Free T4 assay.  Please interpret results in light of the total clinical presentation of the patient.    Vitamin D 1,25 dihydroxy     Status: None   Collection Time: 06/14/16  9:31 AM  Result Value Ref Range   Vitamin D 1, 25 (OH)2 Total 34 18 - 72 pg/mL   Vitamin D3 1, 25 (OH)2 34 pg/mL   Vitamin D2 1, 25 (OH)2 <8 pg/mL    Comment: Vitamin D3, 1,25(OH)2 indicates both endogenous production and supplementation.  Vitamin D2, 1,25(OH)2 is an indicator of exogeous sources, such as diet or supplementation.  Interpretation and therapy are based on measurement of Vitamin D,1,25(OH)2, Total. This test was developed and its analytical performance characteristics have been determined by [facility], [place], [place]. It has not been cleared or approved by the FDA. This assay has been validated pursuant to the CLIA regulations and is used for clinical purposes.     Assessment/Plan: 1. Tension headache IM toradol given with relief. Supportive measures and OTC medications reviewed. Patient with intolerances to most muscle relaxants. Discussed massage therapy. Recommend Follow-up with Neurology.   - ketorolac (TORADOL) injection 60 mg; Inject 2 mLs (60 mg total) into the muscle once.   Leeanne Rio, PA-C

## 2016-06-20 NOTE — Progress Notes (Signed)
Pre visit review using our clinic review tool, if applicable. No additional management support is needed unless otherwise documented below in the visit note. 

## 2016-06-20 NOTE — Patient Instructions (Signed)
The Toradol injection should help get rid of the migraine. Tylenol if needed for additional headache pain this evening.  Use the Diazepam as directed for muscle spasms. Do not use the Restoril while on this medication.   Please follow-up if symptoms are not resolving. If you note headache > 2 days constant, please go to the ER.

## 2016-06-21 ENCOUNTER — Telehealth: Payer: Self-pay | Admitting: Emergency Medicine

## 2016-06-21 ENCOUNTER — Ambulatory Visit: Payer: 59 | Admitting: Physician Assistant

## 2016-06-21 NOTE — Telephone Encounter (Signed)
Per [name] to start Saline nasal spray twice daily, start Claritin daily and continue flonase and astelin nasal pray as directed. If symptoms are not improving to follow up in office for visit. She is agreeable.    Patient calling about sinus pressure symptoms. Started this morning. Patient was seen yesterday for Migraine. She states the migraine has improved. Just having mild headache, sinus pressure, forehead pressure, and runny nose. She has used her Flonase and Astelin nasal spray today with no relief.

## 2016-06-22 ENCOUNTER — Telehealth: Payer: Self-pay | Admitting: *Deleted

## 2016-06-22 DIAGNOSIS — M25561 Pain in right knee: Secondary | ICD-10-CM | POA: Diagnosis not present

## 2016-06-22 DIAGNOSIS — G43709 Chronic migraine without aura, not intractable, without status migrainosus: Secondary | ICD-10-CM

## 2016-06-22 DIAGNOSIS — M25512 Pain in left shoulder: Secondary | ICD-10-CM | POA: Diagnosis not present

## 2016-06-22 DIAGNOSIS — M179 Osteoarthritis of knee, unspecified: Secondary | ICD-10-CM | POA: Diagnosis not present

## 2016-06-22 NOTE — Telephone Encounter (Signed)
Please let patient know that referral has been placed.

## 2016-06-22 NOTE — Addendum Note (Signed)
Addended by: Brunetta Jeans on: 06/22/2016 08:35 PM   Modules accepted: Orders

## 2016-06-22 NOTE — Telephone Encounter (Signed)
Patient called and would like to know if she could get a referral to [facility] (Dr. Rock Nephew) for her migraines.

## 2016-06-23 ENCOUNTER — Telehealth: Payer: Self-pay | Admitting: Physician Assistant

## 2016-06-23 DIAGNOSIS — N760 Acute vaginitis: Secondary | ICD-10-CM | POA: Diagnosis not present

## 2016-06-23 NOTE — Telephone Encounter (Signed)
LMOVM that referral was placed and patient should be receiving a call for appointment.

## 2016-06-23 NOTE — Telephone Encounter (Signed)
Spoke with [name] and wanted patient to be seen to evaluate sinuses to r/o infection. She is agreeable and scheduled on Friday 06/24/16   Spoke with patient she states her sinus pressure has worsened. Having sinus pressure, ear pressure, eye pressure. Has taken Mucinex but makes the migraines worst. Having congestion.

## 2016-06-23 NOTE — Telephone Encounter (Signed)
Patient requesting a call back from Cody's CMA.  Patient declined to provide reason for call back, she said it was personal.

## 2016-06-24 ENCOUNTER — Ambulatory Visit (INDEPENDENT_AMBULATORY_CARE_PROVIDER_SITE_OTHER): Payer: 59 | Admitting: Physician Assistant

## 2016-06-24 ENCOUNTER — Encounter: Payer: Self-pay | Admitting: Physician Assistant

## 2016-06-24 VITALS — BP 122/80 | HR 62 | Temp 98.6°F | Resp 14 | Ht 64.0 in | Wt 186.0 lb

## 2016-06-24 DIAGNOSIS — J019 Acute sinusitis, unspecified: Secondary | ICD-10-CM | POA: Diagnosis not present

## 2016-06-24 DIAGNOSIS — B9689 Other specified bacterial agents as the cause of diseases classified elsewhere: Secondary | ICD-10-CM

## 2016-06-24 DIAGNOSIS — M179 Osteoarthritis of knee, unspecified: Secondary | ICD-10-CM | POA: Diagnosis not present

## 2016-06-24 DIAGNOSIS — M25512 Pain in left shoulder: Secondary | ICD-10-CM | POA: Diagnosis not present

## 2016-06-24 DIAGNOSIS — M25561 Pain in right knee: Secondary | ICD-10-CM | POA: Diagnosis not present

## 2016-06-24 DIAGNOSIS — M9906 Segmental and somatic dysfunction of lower extremity: Secondary | ICD-10-CM | POA: Diagnosis not present

## 2016-06-24 MED ORDER — AMOXICILLIN-POT CLAVULANATE 875-125 MG PO TABS: 1.0000 | ORAL_TABLET | Freq: Two times a day (BID) | ORAL | 0 refills | Status: DC

## 2016-06-24 MED ORDER — DOXYCYCLINE HYCLATE 100 MG PO CAPS: 100.0000 mg | ORAL_CAPSULE | Freq: Two times a day (BID) | ORAL | 0 refills | Status: DC

## 2016-06-24 NOTE — Progress Notes (Signed)
Patient presents to clinic today c/o worsening sinus pressure, sinus pain bilaterally. Endorses now with tooth pain and ear pain bilaterally. Denies fever, chills, chest congestion. Notes thick foul-smelling rhinorrhea. Denies chest pain, SOB. Has been using her Flonase and Astelin without much improvement.   Past Medical History:  Diagnosis Date  . Anxiety   . Asthma   . Chronic high back pain   . Double vision   . Fibromyalgia   . Heart murmur   . High cholesterol   . Hypertension   . Hypoglycemia   . Kidney stone   . Meniscus tear    Right  . Migraine 08/07/2015  . PTSD (post-traumatic stress disorder)   . Schizophrenia ([place])    treated by Dr. Casimiro Needle  . Seizures ([place])    last seizure 3 years ago, not on AED  . Stroke ([place]) 07/08/11   "I've had mini strokes before; left side of face  is more down than right "  . Syncope and collapse    One episode - 07/10/15    Current Outpatient Prescriptions on File Prior to Visit  Medication Sig Dispense Refill  . ADVAIR HFA 115-21 MCG/ACT inhaler INHALE ONE PUFF BY MOUTH TWICE DAILY 12 g 5  . albuterol (PROVENTIL HFA;VENTOLIN HFA) 108 (90 Base) MCG/ACT inhaler Inhale 2 puffs into the lungs every 6 (six) hours as needed for wheezing or shortness of breath. 1 Inhaler 2  . azelastine (ASTELIN) 0.1 % nasal spray Place 1 spray into both nostrils 2 (two) times daily. Use in each nostril as directed 30 mL 12  . diazepam (VALIUM) 2 MG tablet Take 1 tablet (2 mg total) by mouth every 8 (eight) hours as needed for muscle spasms. 10 tablet 0  . fluticasone (FLONASE) 50 MCG/ACT nasal spray USE TWO SPRAY(S) IN EACH NOSTRIL ONCE DAILY 16 g 5  . gabapentin (NEURONTIN) 100 MG capsule TAKE TWO CAPSULES BY MOUTH IN THE MORNING, TWO CAPSULES AT NOON, AND THREE CAPSULES IN THE EVENING 210 capsule 3  . losartan (COZAAR) 25 MG tablet TAKE ONE TABLET BY MOUTH TWICE DAILY 60 tablet 5  . metoprolol succinate (TOPROL-XL) 25 MG 24 hr tablet Take 75 mg by mouth  daily. Take 3 tablets daily (75mg  )    . Multiple Vitamin (THERA) TABS Take by mouth.    . promethazine (PHENERGAN) 12.5 MG tablet Take 1 tablet (12.5 mg total) by mouth every 6 (six) hours as needed for nausea. 20 tablet 0  . rosuvastatin (CRESTOR) 20 MG tablet TAKE ONE TABLET BY MOUTH ONCE DAILY IN THE EVENING 90 tablet 1  . temazepam (RESTORIL) 15 MG capsule Take 2 tablets at bedtime for sleep    . zolpidem (AMBIEN) 10 MG tablet Take 2 tablets by mouth at bedtime.      No current facility-administered medications on file prior to visit.     Allergies  Allergen Reactions  . Sulfa Antibiotics Hives  . Famciclovir Other (See Comments)  . Metronidazole Nausea And Vomiting  . Soma [Carisoprodol] Other (See Comments)    Moody and sleepy Cognitive Function, Daytime sleepiness  . Acyclovir And Related   . Benadryl [Diphenhydramine Hcl]     Severe emotional reaction and doesn't work well with Pt. Past history  . Cephalosporins Hives  . Codeine Other (See Comments)    Makes patient feel odd ; "sometimes mild; sometimes severe reaction; mostly severe"  . Cyclobenzaprine Other (See Comments)    Muscle Aches.  . Diclofenac Sodium Nausea And Vomiting  .  Linzess [Linaclotide] Diarrhea  . Nitrofurantoin Monohyd Macro     Felt funny  . Prednisone Other (See Comments)    migraine  . Tizanidine     Other reaction(s): Other (See Comments) insomnia  . Baclofen Nausea Only  . Meloxicam     Other reaction(s): Confusion  . Sulfamethoxazole Rash    Family History  Problem Relation Age of Onset  . Heart attack Father 44    Living  . Arthritis Father   . Skin cancer Father   . Hypertension Father   . Hyperlipidemia Father   . Leukemia Mother 27    Deceased  . Alzheimer's disease Paternal Aunt     X2  . Stomach cancer Paternal Aunt     x1  . Arthritis/Rheumatoid Paternal Aunt     x1  . Neuropathy Brother     Peripheal  . Fibromyalgia Sister   . HIV Sister   . Arthritis/Rheumatoid  Sister   . Diabetes Paternal Grandmother     Social History   Social History  . Marital status: Married    Spouse name: N/A  . Number of children: 3  . Years of education: 2 yrs coll   Occupational History  . Disabled    Social History Main Topics  . Smoking status: Former Smoker    Packs/day: 1.00    Years: 20.00    Types: Cigarettes    Quit date: 02/29/2004  . Smokeless tobacco: Never Used  . Alcohol use 0.0 oz/week     Comment: "glass of wine q once in awhile; not very often; do it on special occasion"  . Drug use: No  . Sexual activity: No   Other Topics Concern  . None   Social History Narrative   Completed 2 years of college.    On disability.   Lives at home with her husband.   No caffeine use.   Left-handed.   Three children, 2 biological - 1 adopted.    Review of Systems - See HPI.  All other ROS are negative.  BP 122/80   Pulse 62   Temp 98.6 F (37 C) (Oral)   Resp 14   Ht 5\' 4"  (1.626 m)   Wt 186 lb (84.4 kg)   SpO2 98%   BMI 31.93 kg/m   Physical Exam  Constitutional: She is oriented to person, place, and time and well-developed, well-nourished, and in no distress.  HENT:  Head: Normocephalic and atraumatic.  Right Ear: Tympanic membrane normal.  Left Ear: Tympanic membrane normal.  Nose: Mucosal edema and rhinorrhea present. Right sinus exhibits maxillary sinus tenderness. Right sinus exhibits no frontal sinus tenderness. Left sinus exhibits maxillary sinus tenderness and frontal sinus tenderness.  Mouth/Throat: Uvula is midline, oropharynx is clear and moist and mucous membranes are normal.  Eyes: Conjunctivae are normal.  Neck: Neck supple.  Cardiovascular: Normal rate, regular rhythm, normal heart sounds and intact distal pulses.   Pulmonary/Chest: Effort normal and breath sounds normal. No respiratory distress. She has no wheezes. She has no rales. She exhibits no tenderness.  Neurological: She is alert and oriented to person, place, and  time.  Skin: Skin is warm and dry. No rash noted.  Psychiatric: Affect normal.  Vitals reviewed.   Recent Results (from the past 2160 hour(s))  B12     Status: None   Collection Time: 03/31/16  9:36 AM  Result Value Ref Range   Vitamin B-12 773 211 - 911 pg/mL  TSH  Status: None   Collection Time: 06/14/16  9:31 AM  Result Value Ref Range   TSH 2.76 0.35 - 4.50 uIU/mL  T4, free     Status: None   Collection Time: 06/14/16  9:31 AM  Result Value Ref Range   Free T4 0.88 0.60 - 1.60 ng/dL    Comment: Specimens from patients who are undergoing biotin therapy and /or ingesting biotin supplements may contain high levels of biotin.  The higher biotin concentration in these specimens interferes with this Free T4 assay.  Specimens that contain high levels  of biotin may cause false high results for this Free T4 assay.  Please interpret results in light of the total clinical presentation of the patient.    Vitamin D 1,25 dihydroxy     Status: None   Collection Time: 06/14/16  9:31 AM  Result Value Ref Range   Vitamin D 1, 25 (OH)2 Total 34 18 - 72 pg/mL   Vitamin D3 1, 25 (OH)2 34 pg/mL   Vitamin D2 1, 25 (OH)2 <8 pg/mL    Comment: Vitamin D3, 1,25(OH)2 indicates both endogenous production and supplementation.  Vitamin D2, 1,25(OH)2 is an indicator of exogeous sources, such as diet or supplementation.  Interpretation and therapy are based on measurement of Vitamin D,1,25(OH)2, Total. This test was developed and its analytical performance characteristics have been determined by [facility], [place], [place]. It has not been cleared or approved by the FDA. This assay has been validated pursuant to the CLIA regulations and is used for clinical purposes.     Assessment/Plan: 1. Acute bacterial sinusitis Rx Augmentin.  Increase fluids.  Rest.  Saline nasal spray.  Probiotic.  Mucinex as directed.  Humidifier in bedroom. Continue allergy medications as directed..   Call or return to clinic if symptoms are not improving.    Leeanne Rio, PA-C

## 2016-06-24 NOTE — Progress Notes (Signed)
Pre visit review using our clinic review tool, if applicable. No additional management support is needed unless otherwise documented below in the visit note. 

## 2016-06-24 NOTE — Patient Instructions (Signed)
Please take antibiotic as directed.  Increase fluid intake.  Use Saline nasal spray.  Take a daily multivitamin. Continue Flonase and Astelin.  Place a humidifier in the bedroom.  Please call or return clinic if symptoms are not improving.  Levada Dy up front is working on an appointment for you with Dr. [name].  Sinusitis Sinusitis is redness, soreness, and swelling (inflammation) of the paranasal sinuses. Paranasal sinuses are air pockets within the bones of your face (beneath the eyes, the middle of the forehead, or above the eyes). In healthy paranasal sinuses, mucus is able to drain out, and air is able to circulate through them by way of your nose. However, when your paranasal sinuses are inflamed, mucus and air can become trapped. This can allow bacteria and other germs to grow and cause infection. Sinusitis can develop quickly and last only a short time (acute) or continue over a long period (chronic). Sinusitis that lasts for more than 12 weeks is considered chronic.  CAUSES  Causes of sinusitis include:  Allergies.  Structural abnormalities, such as displacement of the cartilage that separates your nostrils (deviated septum), which can decrease the air flow through your nose and sinuses and affect sinus drainage.  Functional abnormalities, such as when the small hairs (cilia) that line your sinuses and help remove mucus do not work properly or are not present. SYMPTOMS  Symptoms of acute and chronic sinusitis are the same. The primary symptoms are pain and pressure around the affected sinuses. Other symptoms include:  Upper toothache.  Earache.  Headache.  Bad breath.  Decreased sense of smell and taste.  A cough, which worsens when you are lying flat.  Fatigue.  Fever.  Thick drainage from your nose, which often is green and may contain pus (purulent).  Swelling and warmth over the affected sinuses. DIAGNOSIS  Your caregiver will perform a physical exam. During the exam,  your caregiver may:  Look in your nose for signs of abnormal growths in your nostrils (nasal polyps).  Tap over the affected sinus to check for signs of infection.  View the inside of your sinuses (endoscopy) with a special imaging device with a light attached (endoscope), which is inserted into your sinuses. If your caregiver suspects that you have chronic sinusitis, one or more of the following tests may be recommended:  Allergy tests.  Nasal culture A sample of mucus is taken from your nose and sent to a lab and screened for bacteria.  Nasal cytology A sample of mucus is taken from your nose and examined by your caregiver to determine if your sinusitis is related to an allergy. TREATMENT  Most cases of acute sinusitis are related to a viral infection and will resolve on their own within 10 days. Sometimes medicines are prescribed to help relieve symptoms (pain medicine, decongestants, nasal steroid sprays, or saline sprays).  However, for sinusitis related to a bacterial infection, your caregiver will prescribe antibiotic medicines. These are medicines that will help kill the bacteria causing the infection.  Rarely, sinusitis is caused by a fungal infection. In theses cases, your caregiver will prescribe antifungal medicine. For some cases of chronic sinusitis, surgery is needed. Generally, these are cases in which sinusitis recurs more than 3 times per year, despite other treatments. HOME CARE INSTRUCTIONS   Drink plenty of water. Water helps thin the mucus so your sinuses can drain more easily.  Use a humidifier.  Inhale steam 3 to 4 times a day (for example, sit in the bathroom with  the shower running).  Apply a warm, moist washcloth to your face 3 to 4 times a day, or as directed by your caregiver.  Use saline nasal sprays to help moisten and clean your sinuses.  Take over-the-counter or prescription medicines for pain, discomfort, or fever only as directed by your  caregiver. SEEK IMMEDIATE MEDICAL CARE IF:  You have increasing pain or severe headaches.  You have nausea, vomiting, or drowsiness.  You have swelling around your face.  You have vision problems.  You have a stiff neck.  You have difficulty breathing. MAKE SURE YOU:   Understand these instructions.  Will watch your condition.  Will get help right away if you are not doing well or get worse. Document Released: 02/14/2005 Document Revised: 05/09/2011 Document Reviewed: 03/01/2011 [facility] Patient Information 2014 [place], [place].

## 2016-06-27 ENCOUNTER — Telehealth: Payer: Self-pay | Admitting: *Deleted

## 2016-06-27 DIAGNOSIS — M25561 Pain in right knee: Secondary | ICD-10-CM | POA: Diagnosis not present

## 2016-06-27 DIAGNOSIS — M179 Osteoarthritis of knee, unspecified: Secondary | ICD-10-CM | POA: Diagnosis not present

## 2016-06-27 DIAGNOSIS — M25512 Pain in left shoulder: Secondary | ICD-10-CM | POA: Diagnosis not present

## 2016-06-27 NOTE — Telephone Encounter (Signed)
When she left last appointment she was to be setting up an appointment with Dr. [name]. Did she try to get an appointment set up since they are her current Neurology group and she needs to follow with them until she sees her new provider.

## 2016-06-27 NOTE — Telephone Encounter (Signed)
VM left on answering machine this morning:  Pt states that she got in to see Dr. Rock Nephew June 27th.   Stated that she was not sure if [name] would want to do anything for her and her migraine before then. Asked that someone call her at [phone]

## 2016-06-28 DIAGNOSIS — M25561 Pain in right knee: Secondary | ICD-10-CM | POA: Diagnosis not present

## 2016-06-28 DIAGNOSIS — M9906 Segmental and somatic dysfunction of lower extremity: Secondary | ICD-10-CM | POA: Diagnosis not present

## 2016-06-28 MED ORDER — ORPHENADRINE CITRATE ER 100 MG PO TB12: 100.0000 mg | ORAL_TABLET | Freq: Two times a day (BID) | ORAL | 0 refills | Status: DC | PRN

## 2016-06-28 NOTE — Telephone Encounter (Signed)
Pt States that this is her 13th episode of migraine in 2 weeks and she states that she really needs something until she gets in with the other doctor.  She is seeing DR [name] on June 27th.  Patient is aware message will be sent to provider to see if there is something he can do to get her through.

## 2016-06-28 NOTE — Addendum Note (Signed)
Addended by: Brunetta Jeans on: 06/28/2016 02:20 PM   Modules accepted: Orders

## 2016-06-28 NOTE — Telephone Encounter (Signed)
Spoke with patient. Previously on Norflex for several months with significant relief in tension headaches and migraines.  Had episode of nausea and vomiting and thought was from the Norflex but was also sick at the time.  As such she would like to restart. I am ok with this. Rx sent in. Follow-up discussed. Alarm symptoms/signs discussed that would prompt ER assessment. If headache frequency not improving, will need to consider the Effexor.

## 2016-06-28 NOTE — Telephone Encounter (Signed)
I have reviewed her chart including medication allergies/intolerances. Unfortunately she has had "reactions" to most all typical muscle relaxants for the tension component of chronic headaches. She recently saw Dr. Posey Pronto as a new consult who attempted to restart the Cyclobenzaprine at low dose but patient could not tolerate. She also cannot take true pain medication due to side effect. She is currently on a beta-blocker to help with migraine prevention. Has previously been unable to tolerate TCAs like amitriptyline. Also previously refused triptans (Imitrex) for abortive therapy.   We can attempt low dose of something like Effexor XR to help with headaches prevention. I am hesitant to use this giving her prior psychiatric history from when she saw Dr. Casimiro Needle. I did start a short-course of the diazepam (Valium) to help with muscle relaxation. Is she using? Is it helping and to what degree?  Lastly, has she attempted to contact Dr. Posey Pronto, the most recent Neurologist she has seen for their advice?

## 2016-06-28 NOTE — Telephone Encounter (Signed)
Patient states last time she had Norflex and the more she thinks about it she realizes that she was sick when she took it, so she said maybe it didn't affect her like she thought.       She is willing to try iNorflex again or try the Effexor if provider thinks it will help.   Discussed with her calling Dr. Posey Pronto and patient refused.

## 2016-06-29 DIAGNOSIS — M25561 Pain in right knee: Secondary | ICD-10-CM | POA: Diagnosis not present

## 2016-06-29 DIAGNOSIS — M9906 Segmental and somatic dysfunction of lower extremity: Secondary | ICD-10-CM | POA: Diagnosis not present

## 2016-06-30 DIAGNOSIS — M791 Myalgia: Secondary | ICD-10-CM | POA: Diagnosis not present

## 2016-06-30 DIAGNOSIS — M25512 Pain in left shoulder: Secondary | ICD-10-CM | POA: Diagnosis not present

## 2016-06-30 DIAGNOSIS — M179 Osteoarthritis of knee, unspecified: Secondary | ICD-10-CM | POA: Diagnosis not present

## 2016-06-30 DIAGNOSIS — M25561 Pain in right knee: Secondary | ICD-10-CM | POA: Diagnosis not present

## 2016-07-03 DIAGNOSIS — Z23 Encounter for immunization: Secondary | ICD-10-CM | POA: Diagnosis not present

## 2016-07-03 DIAGNOSIS — T148XXA Other injury of unspecified body region, initial encounter: Secondary | ICD-10-CM | POA: Diagnosis not present

## 2016-07-04 DIAGNOSIS — M25561 Pain in right knee: Secondary | ICD-10-CM | POA: Diagnosis not present

## 2016-07-04 DIAGNOSIS — M25512 Pain in left shoulder: Secondary | ICD-10-CM | POA: Diagnosis not present

## 2016-07-04 DIAGNOSIS — M179 Osteoarthritis of knee, unspecified: Secondary | ICD-10-CM | POA: Diagnosis not present

## 2016-07-05 DIAGNOSIS — M9906 Segmental and somatic dysfunction of lower extremity: Secondary | ICD-10-CM | POA: Diagnosis not present

## 2016-07-05 DIAGNOSIS — M25561 Pain in right knee: Secondary | ICD-10-CM | POA: Diagnosis not present

## 2016-07-06 DIAGNOSIS — H903 Sensorineural hearing loss, bilateral: Secondary | ICD-10-CM | POA: Diagnosis not present

## 2016-07-08 DIAGNOSIS — M25561 Pain in right knee: Secondary | ICD-10-CM | POA: Diagnosis not present

## 2016-07-08 DIAGNOSIS — M25512 Pain in left shoulder: Secondary | ICD-10-CM | POA: Diagnosis not present

## 2016-07-08 DIAGNOSIS — M9906 Segmental and somatic dysfunction of lower extremity: Secondary | ICD-10-CM | POA: Diagnosis not present

## 2016-07-08 DIAGNOSIS — M179 Osteoarthritis of knee, unspecified: Secondary | ICD-10-CM | POA: Diagnosis not present

## 2016-07-11 DIAGNOSIS — M25561 Pain in right knee: Secondary | ICD-10-CM | POA: Diagnosis not present

## 2016-07-11 DIAGNOSIS — M9906 Segmental and somatic dysfunction of lower extremity: Secondary | ICD-10-CM | POA: Diagnosis not present

## 2016-07-12 DIAGNOSIS — M9906 Segmental and somatic dysfunction of lower extremity: Secondary | ICD-10-CM | POA: Diagnosis not present

## 2016-07-12 DIAGNOSIS — M25561 Pain in right knee: Secondary | ICD-10-CM | POA: Diagnosis not present

## 2016-07-13 ENCOUNTER — Telehealth: Payer: Self-pay | Admitting: *Deleted

## 2016-07-13 DIAGNOSIS — L853 Xerosis cutis: Secondary | ICD-10-CM | POA: Diagnosis not present

## 2016-07-13 DIAGNOSIS — M25512 Pain in left shoulder: Secondary | ICD-10-CM | POA: Diagnosis not present

## 2016-07-13 DIAGNOSIS — L218 Other seborrheic dermatitis: Secondary | ICD-10-CM | POA: Diagnosis not present

## 2016-07-13 DIAGNOSIS — M25561 Pain in right knee: Secondary | ICD-10-CM | POA: Diagnosis not present

## 2016-07-13 DIAGNOSIS — M179 Osteoarthritis of knee, unspecified: Secondary | ICD-10-CM | POA: Diagnosis not present

## 2016-07-13 NOTE — Telephone Encounter (Signed)
Patient calling to report that she has gained 14 pounds since she was seen.   She would like to know what her provider wants her to do.

## 2016-07-13 NOTE — Telephone Encounter (Signed)
Patient states that she is eating the same foods that she always eats and she is not exercising.   Wanted appointment with provider.  Appt scheduled

## 2016-07-13 NOTE — Telephone Encounter (Signed)
Left message for patient to call back to discuss.

## 2016-07-13 NOTE — Telephone Encounter (Signed)
Recent thyroid function was normal. Could be related to Gabapentin use as this can be a side effect. We could consider decreasing this but would have to figure out replacement medication for pain. She is welcome to schedule an appointment with me tomorrow so we can discuss further. What is she currently eating throughout the day? How myc exercise is she getting?

## 2016-07-14 ENCOUNTER — Encounter: Payer: Self-pay | Admitting: Physician Assistant

## 2016-07-14 ENCOUNTER — Ambulatory Visit (INDEPENDENT_AMBULATORY_CARE_PROVIDER_SITE_OTHER): Payer: 59 | Admitting: Physician Assistant

## 2016-07-14 VITALS — BP 122/84 | HR 58 | Temp 98.4°F | Resp 14 | Ht 64.0 in | Wt 191.0 lb

## 2016-07-14 DIAGNOSIS — M797 Fibromyalgia: Secondary | ICD-10-CM | POA: Diagnosis not present

## 2016-07-14 DIAGNOSIS — R635 Abnormal weight gain: Secondary | ICD-10-CM | POA: Diagnosis not present

## 2016-07-14 MED ORDER — PREGABALIN 75 MG PO CAPS: 75.0000 mg | ORAL_CAPSULE | Freq: Two times a day (BID) | ORAL | 0 refills | Status: DC

## 2016-07-14 NOTE — Progress Notes (Signed)
Patient presents to clinic today c/o significant weight gain over the past several months without change in diet or activity level. Patient endorses eating a well-balanced diet overall but does snack throughout the day. Is not being mindful of calories she is taking in. States her main issue is her love of chips. Patient is not currently engaged in regular physical activity. Has recently started swimming but was only able to go once as she broke out in a rash from the chlorine. Patient without known history of hypothyroidism. Is currently on Gabapentin for her fibromyalgia, currently at a dose of 200 mg AM, 200 mg noon and 300 mg evening. Has noted weight gain since increase in her dose, but notes that her pain is under the best control it has been in quite some time.   Past Medical History:  Diagnosis Date  . Anxiety   . Asthma   . Chronic high back pain   . Double vision   . Fibromyalgia   . Heart murmur   . High cholesterol   . Hypertension   . Hypoglycemia   . Kidney stone   . Meniscus tear    Right  . Migraine 08/07/2015  . PTSD (post-traumatic stress disorder)   . Schizophrenia ([place])    treated by Dr. Casimiro Needle  . Seizures ([place])    last seizure 3 years ago, not on AED  . Stroke ([place]) 07/08/11   "I've had mini strokes before; left side of face  is more down than right "  . Syncope and collapse    One episode - 07/10/15    Current Outpatient Prescriptions on File Prior to Visit  Medication Sig Dispense Refill  . ADVAIR HFA 115-21 MCG/ACT inhaler INHALE ONE PUFF BY MOUTH TWICE DAILY 12 g 5  . albuterol (PROVENTIL HFA;VENTOLIN HFA) 108 (90 Base) MCG/ACT inhaler Inhale 2 puffs into the lungs every 6 (six) hours as needed for wheezing or shortness of breath. 1 Inhaler 2  . azelastine (ASTELIN) 0.1 % nasal spray Place 1 spray into both nostrils 2 (two) times daily. Use in each nostril as directed 30 mL 12  . fluticasone (FLONASE) 50 MCG/ACT nasal spray USE TWO SPRAY(S) IN EACH NOSTRIL  ONCE DAILY 16 g 5  . losartan (COZAAR) 25 MG tablet TAKE ONE TABLET BY MOUTH TWICE DAILY 60 tablet 5  . metoprolol succinate (TOPROL-XL) 25 MG 24 hr tablet Take 75 mg by mouth daily. Take 3 tablets daily (75mg  )    . Multiple Vitamin (THERA) TABS Take by mouth.    . orphenadrine (NORFLEX) 100 MG tablet Take 1 tablet (100 mg total) by mouth 2 (two) times daily as needed for muscle spasms. 60 tablet 0  . promethazine (PHENERGAN) 12.5 MG tablet Take 1 tablet (12.5 mg total) by mouth every 6 (six) hours as needed for nausea. 20 tablet 0  . rosuvastatin (CRESTOR) 20 MG tablet TAKE ONE TABLET BY MOUTH ONCE DAILY IN THE EVENING 90 tablet 1  . zolpidem (AMBIEN) 10 MG tablet Take 2 tablets by mouth at bedtime.      No current facility-administered medications on file prior to visit.     Allergies  Allergen Reactions  . Sulfa Antibiotics Hives  . Famciclovir Other (See Comments)  . Metronidazole Nausea And Vomiting  . Soma [Carisoprodol] Other (See Comments)    Moody and sleepy Cognitive Function, Daytime sleepiness  . Acyclovir And Related   . Benadryl [Diphenhydramine Hcl]     Severe emotional reaction and doesn't  work well with Pt. Past history  . Cephalosporins Hives  . Codeine Other (See Comments)    Makes patient feel odd ; "sometimes mild; sometimes severe reaction; mostly severe"  . Cyclobenzaprine Other (See Comments)    Muscle Aches.  . Diclofenac Sodium Nausea And Vomiting  . Linzess [Linaclotide] Diarrhea  . Nitrofurantoin Monohyd Macro     Felt funny  . Prednisone Other (See Comments)    migraine  . Tizanidine     Other reaction(s): Other (See Comments) insomnia  . Baclofen Nausea Only  . Meloxicam     Other reaction(s): Confusion  . Sulfamethoxazole Rash    Family History  Problem Relation Age of Onset  . Heart attack Father 99       Living  . Arthritis Father   . Skin cancer Father   . Hypertension Father   . Hyperlipidemia Father   . Leukemia Mother 50        Deceased  . Alzheimer's disease Paternal Aunt        X2  . Stomach cancer Paternal Aunt        x1  . Arthritis/Rheumatoid Paternal Aunt        x1  . Neuropathy Brother        Peripheal  . Fibromyalgia Sister   . HIV Sister   . Arthritis/Rheumatoid Sister   . Diabetes Paternal Grandmother     Social History   Social History  . Marital status: Married    Spouse name: N/A  . Number of children: 3  . Years of education: 2 yrs coll   Occupational History  . Disabled    Social History Main Topics  . Smoking status: Former Smoker    Packs/day: 1.00    Years: 20.00    Types: Cigarettes    Quit date: 02/29/2004  . Smokeless tobacco: Never Used  . Alcohol use 0.0 oz/week     Comment: "glass of wine q once in awhile; not very often; do it on special occasion"  . Drug use: No  . Sexual activity: No   Other Topics Concern  . None   Social History Narrative   Completed 2 years of college.    On disability.   Lives at home with her husband.   No caffeine use.   Left-handed.   Three children, 2 biological - 1 adopted.   Review of Systems - See HPI.  All other ROS are negative.  BP 122/84   Pulse (!) 58   Temp 98.4 F (36.9 C) (Oral)   Resp 14   Ht 5\' 4"  (1.626 m)   Wt 191 lb (86.6 kg)   SpO2 97%   BMI 32.79 kg/m   Physical Exam  Constitutional: She is oriented to person, place, and time and well-developed, well-nourished, and in no distress.  HENT:  Head: Normocephalic and atraumatic.  Eyes: Conjunctivae are normal.  Neck: Neck supple.  Cardiovascular: Normal rate, regular rhythm, normal heart sounds and intact distal pulses.   Pulmonary/Chest: Effort normal. No respiratory distress. She has no wheezes. She has no rales. She exhibits no tenderness.  Neurological: She is alert and oriented to person, place, and time.  Skin: Skin is warm and dry. No rash noted.  Psychiatric: Affect normal.  Vitals reviewed.   Recent Results (from the past 2160 hour(s))  TSH      Status: None   Collection Time: 06/14/16  9:31 AM  Result Value Ref Range   TSH 2.76 0.35 - 4.50  uIU/mL  T4, free     Status: None   Collection Time: 06/14/16  9:31 AM  Result Value Ref Range   Free T4 0.88 0.60 - 1.60 ng/dL    Comment: Specimens from patients who are undergoing biotin therapy and /or ingesting biotin supplements may contain high levels of biotin.  The higher biotin concentration in these specimens interferes with this Free T4 assay.  Specimens that contain high levels  of biotin may cause false high results for this Free T4 assay.  Please interpret results in light of the total clinical presentation of the patient.    Vitamin D 1,25 dihydroxy     Status: None   Collection Time: 06/14/16  9:31 AM  Result Value Ref Range   Vitamin D 1, 25 (OH)2 Total 34 18 - 72 pg/mL   Vitamin D3 1, 25 (OH)2 34 pg/mL   Vitamin D2 1, 25 (OH)2 <8 pg/mL    Comment: Vitamin D3, 1,25(OH)2 indicates both endogenous production and supplementation.  Vitamin D2, 1,25(OH)2 is an indicator of exogeous sources, such as diet or supplementation.  Interpretation and therapy are based on measurement of Vitamin D,1,25(OH)2, Total. This test was developed and its analytical performance characteristics have been determined by [facility], [place], [place]. It has not been cleared or approved by the FDA. This assay has been validated pursuant to the CLIA regulations and is used for clinical purposes.     Assessment/Plan: 1. Fibromyalgia Will switch Gabapentin for Lyrica to see if we can control pain at lower dose and lower risk of weight gain. Discussed with patient that there is still chance of weight gain with Lyrica so this is a trial of medication. She voices understanding and agreement with the plan.  2. Weight gain Suspect multifactorial. Discussed calorie counting and avoidance of excess snacking. Patient is going to try a different pool for exercise. Recent thyroid check  negative. Will swap Gabapentin for Lyrica at a lower dose. Close follow-up scheduled.    Leeanne Rio, PA-C

## 2016-07-14 NOTE — Patient Instructions (Signed)
Please start the Lyrica tomorrow instead of the Gabapentin, taking twice daily. Keep up with the exercise you have started to help keep weight down. Check weight once weekly and write down. I want you to keep a food journal daily and bring to your follow-up with me.   Follow-up with me in 2 weeks.

## 2016-07-14 NOTE — Progress Notes (Signed)
Pre visit review using our clinic review tool, if applicable. No additional management support is needed unless otherwise documented below in the visit note. 

## 2016-07-18 DIAGNOSIS — M25512 Pain in left shoulder: Secondary | ICD-10-CM | POA: Diagnosis not present

## 2016-07-18 DIAGNOSIS — M25561 Pain in right knee: Secondary | ICD-10-CM | POA: Diagnosis not present

## 2016-07-18 DIAGNOSIS — M179 Osteoarthritis of knee, unspecified: Secondary | ICD-10-CM | POA: Diagnosis not present

## 2016-07-19 ENCOUNTER — Telehealth: Payer: Self-pay | Admitting: *Deleted

## 2016-07-19 DIAGNOSIS — M25561 Pain in right knee: Secondary | ICD-10-CM | POA: Diagnosis not present

## 2016-07-19 DIAGNOSIS — R635 Abnormal weight gain: Secondary | ICD-10-CM | POA: Insufficient documentation

## 2016-07-19 DIAGNOSIS — M9906 Segmental and somatic dysfunction of lower extremity: Secondary | ICD-10-CM | POA: Diagnosis not present

## 2016-07-19 NOTE — Telephone Encounter (Signed)
PA was completed through Cover My Meds for Lyrica 75mg .  Approval was sent from [place] through 07/18/2017 Approval ID #   [id number]  Paperwork faxed to pharmacy for filling of medication

## 2016-07-20 DIAGNOSIS — M25561 Pain in right knee: Secondary | ICD-10-CM | POA: Diagnosis not present

## 2016-07-20 DIAGNOSIS — M9906 Segmental and somatic dysfunction of lower extremity: Secondary | ICD-10-CM | POA: Diagnosis not present

## 2016-07-21 ENCOUNTER — Telehealth: Payer: Self-pay | Admitting: Physician Assistant

## 2016-07-21 DIAGNOSIS — M797 Fibromyalgia: Secondary | ICD-10-CM

## 2016-07-21 DIAGNOSIS — G8929 Other chronic pain: Secondary | ICD-10-CM

## 2016-07-21 DIAGNOSIS — M25561 Pain in right knee: Secondary | ICD-10-CM | POA: Diagnosis not present

## 2016-07-21 DIAGNOSIS — M25512 Pain in left shoulder: Secondary | ICD-10-CM | POA: Diagnosis not present

## 2016-07-21 DIAGNOSIS — M179 Osteoarthritis of knee, unspecified: Secondary | ICD-10-CM | POA: Diagnosis not present

## 2016-07-21 NOTE — Telephone Encounter (Signed)
Please advise 

## 2016-07-21 NOTE — Telephone Encounter (Signed)
Patient calling to report the pregabalin (LYRICA) 75 MG capsule that [name] started her on does not work as well as the gabapentin.  She is requesting Cody's advise on what she should do.

## 2016-07-21 NOTE — Telephone Encounter (Signed)
We have different options -- the first is to increase the dose of the Lyrica as long as she is tolerating well without side effects. How is her weight on new medication? Second option would be to return to using the Gabapentin but we know this has caused her to gain weight. If we resumed the Gabapentin, I would want her to work hard on diet and exercise, and I would want to set her up with a pain specialist who maybe can offer [place] some other solutions to her pain since she is so sensitive to other medication.   Let me know what she thinks.  Please remind her that I am out of office today and will not be able to respond again until tomorrow.

## 2016-07-22 DIAGNOSIS — M25561 Pain in right knee: Secondary | ICD-10-CM | POA: Diagnosis not present

## 2016-07-22 DIAGNOSIS — M9906 Segmental and somatic dysfunction of lower extremity: Secondary | ICD-10-CM | POA: Diagnosis not present

## 2016-07-22 MED ORDER — GABAPENTIN 100 MG PO CAPS: ORAL_CAPSULE | ORAL | 3 refills | Status: DC

## 2016-07-22 NOTE — Telephone Encounter (Signed)
Patient is aware of all details below - stated verbal understanding.  She does want to go back to Gabapentin.   She is also okay with pain management.   Provider is aware and will be making these changes.

## 2016-07-22 NOTE — Telephone Encounter (Signed)
Pt calling checking status and asking if she could go back on gabapentin, please advise

## 2016-07-22 NOTE — Telephone Encounter (Signed)
See note below as well. Please call patient to discuss.

## 2016-07-26 DIAGNOSIS — M542 Cervicalgia: Secondary | ICD-10-CM | POA: Diagnosis not present

## 2016-07-26 DIAGNOSIS — M7541 Impingement syndrome of right shoulder: Secondary | ICD-10-CM | POA: Diagnosis not present

## 2016-07-26 DIAGNOSIS — M5136 Other intervertebral disc degeneration, lumbar region: Secondary | ICD-10-CM | POA: Diagnosis not present

## 2016-07-27 DIAGNOSIS — M25512 Pain in left shoulder: Secondary | ICD-10-CM | POA: Diagnosis not present

## 2016-07-27 DIAGNOSIS — M9906 Segmental and somatic dysfunction of lower extremity: Secondary | ICD-10-CM | POA: Diagnosis not present

## 2016-07-27 DIAGNOSIS — M179 Osteoarthritis of knee, unspecified: Secondary | ICD-10-CM | POA: Diagnosis not present

## 2016-07-27 DIAGNOSIS — M25561 Pain in right knee: Secondary | ICD-10-CM | POA: Diagnosis not present

## 2016-07-28 ENCOUNTER — Ambulatory Visit: Payer: 59 | Admitting: Physician Assistant

## 2016-07-28 ENCOUNTER — Encounter: Payer: Self-pay | Admitting: Emergency Medicine

## 2016-07-28 DIAGNOSIS — M179 Osteoarthritis of knee, unspecified: Secondary | ICD-10-CM | POA: Diagnosis not present

## 2016-07-28 DIAGNOSIS — M25561 Pain in right knee: Secondary | ICD-10-CM | POA: Diagnosis not present

## 2016-07-28 DIAGNOSIS — M25512 Pain in left shoulder: Secondary | ICD-10-CM | POA: Diagnosis not present

## 2016-07-28 NOTE — Progress Notes (Deleted)
Patient presents to clinic today for follow-up regarding weight gain 2/2 medications and lifestyle. At last visit, an attempt was made to switch patient's gabapentin to Lyrica to see if she tolerated the medication well without continued weight gain as the gabapentin had helped considerably with pain. Patient endorses taking the Lyrica as directed but noted significant deterioration in pain control. In regards to diet and exercise, ***. ***.  Past Medical History:  Diagnosis Date  . Anxiety   . Asthma   . Chronic high back pain   . Double vision   . Fibromyalgia   . Heart murmur   . High cholesterol   . Hypertension   . Hypoglycemia   . Kidney stone   . Meniscus tear    Right  . Migraine 08/07/2015  . PTSD (post-traumatic stress disorder)   . Schizophrenia ([place])    treated by Dr. Casimiro Needle  . Seizures ([place])    last seizure 3 years ago, not on AED  . Stroke ([place]) 07/08/11   "I've had mini strokes before; left side of face  is more down than right "  . Syncope and collapse    One episode - 07/10/15    Current Outpatient Prescriptions on File Prior to Visit  Medication Sig Dispense Refill  . ADVAIR HFA 115-21 MCG/ACT inhaler INHALE ONE PUFF BY MOUTH TWICE DAILY 12 g 5  . albuterol (PROVENTIL HFA;VENTOLIN HFA) 108 (90 Base) MCG/ACT inhaler Inhale 2 puffs into the lungs every 6 (six) hours as needed for wheezing or shortness of breath. 1 Inhaler 2  . azelastine (ASTELIN) 0.1 % nasal spray Place 1 spray into both nostrils 2 (two) times daily. Use in each nostril as directed 30 mL 12  . fluticasone (FLONASE) 50 MCG/ACT nasal spray USE TWO SPRAY(S) IN EACH NOSTRIL ONCE DAILY 16 g 5  . gabapentin (NEURONTIN) 100 MG capsule TAKE TWO CAPSULES BY MOUTH IN THE MORNING, TWO CAPSULES AT NOON, AND THREE CAPSULES IN THE EVENING 210 capsule 3  . hydrocortisone 2.5 % cream     . ketoconazole (NIZORAL) 2 % cream     . losartan (COZAAR) 25 MG tablet TAKE ONE TABLET BY MOUTH TWICE DAILY 60 tablet 5    . metoprolol succinate (TOPROL-XL) 25 MG 24 hr tablet Take 75 mg by mouth daily. Take 3 tablets daily (75mg  )    . Multiple Vitamin (THERA) TABS Take by mouth.    . orphenadrine (NORFLEX) 100 MG tablet Take 1 tablet (100 mg total) by mouth 2 (two) times daily as needed for muscle spasms. 60 tablet 0  . promethazine (PHENERGAN) 12.5 MG tablet Take 1 tablet (12.5 mg total) by mouth every 6 (six) hours as needed for nausea. 20 tablet 0  . rosuvastatin (CRESTOR) 20 MG tablet TAKE ONE TABLET BY MOUTH ONCE DAILY IN THE EVENING 90 tablet 1  . triamcinolone cream (KENALOG) 0.1 %     . zolpidem (AMBIEN) 10 MG tablet Take 2 tablets by mouth at bedtime.      No current facility-administered medications on file prior to visit.     Allergies  Allergen Reactions  . Sulfa Antibiotics Hives  . Famciclovir Other (See Comments)  . Metronidazole Nausea And Vomiting  . Soma [Carisoprodol] Other (See Comments)    Moody and sleepy Cognitive Function, Daytime sleepiness  . Acyclovir And Related   . Benadryl [Diphenhydramine Hcl]     Severe emotional reaction and doesn't work well with Pt. Past history  . Cephalosporins Hives  .  Codeine Other (See Comments)    Makes patient feel odd ; "sometimes mild; sometimes severe reaction; mostly severe"  . Cyclobenzaprine Other (See Comments)    Muscle Aches.  . Diclofenac Sodium Nausea And Vomiting  . Linzess [Linaclotide] Diarrhea  . Nitrofurantoin Monohyd Macro     Felt funny  . Prednisone Other (See Comments)    migraine  . Tizanidine     Other reaction(s): Other (See Comments) insomnia  . Baclofen Nausea Only  . Meloxicam     Other reaction(s): Confusion  . Sulfamethoxazole Rash    Family History  Problem Relation Age of Onset  . Heart attack Father 38       Living  . Arthritis Father   . Skin cancer Father   . Hypertension Father   . Hyperlipidemia Father   . Leukemia Mother 79       Deceased  . Alzheimer's disease Paternal Aunt        X2   . Stomach cancer Paternal Aunt        x1  . Arthritis/Rheumatoid Paternal Aunt        x1  . Neuropathy Brother        Peripheal  . Fibromyalgia Sister   . HIV Sister   . Arthritis/Rheumatoid Sister   . Diabetes Paternal Grandmother     Social History   Social History  . Marital status: Married    Spouse name: N/A  . Number of children: 3  . Years of education: 2 yrs coll   Occupational History  . Disabled    Social History Main Topics  . Smoking status: Former Smoker    Packs/day: 1.00    Years: 20.00    Types: Cigarettes    Quit date: 02/29/2004  . Smokeless tobacco: Never Used  . Alcohol use 0.0 oz/week     Comment: "glass of wine q once in awhile; not very often; do it on special occasion"  . Drug use: No  . Sexual activity: No   Other Topics Concern  . Not on file   Social History Narrative   Completed 2 years of college.    On disability.   Lives at home with her husband.   No caffeine use.   Left-handed.   Three children, 2 biological - 1 adopted.    Review of Systems - See HPI.  All other ROS are negative.  There were no vitals taken for this visit.  Physical Exam  Recent Results (from the past 2160 hour(s))  TSH     Status: None   Collection Time: 06/14/16  9:31 AM  Result Value Ref Range   TSH 2.76 0.35 - 4.50 uIU/mL  T4, free     Status: None   Collection Time: 06/14/16  9:31 AM  Result Value Ref Range   Free T4 0.88 0.60 - 1.60 ng/dL    Comment: Specimens from patients who are undergoing biotin therapy and /or ingesting biotin supplements may contain high levels of biotin.  The higher biotin concentration in these specimens interferes with this Free T4 assay.  Specimens that contain high levels  of biotin may cause false high results for this Free T4 assay.  Please interpret results in light of the total clinical presentation of the patient.    Vitamin D 1,25 dihydroxy     Status: None   Collection Time: 06/14/16  9:31 AM  Result Value Ref  Range   Vitamin D 1, 25 (OH)2 Total 34 18 - 72  pg/mL   Vitamin D3 1, 25 (OH)2 34 pg/mL   Vitamin D2 1, 25 (OH)2 <8 pg/mL    Comment: Vitamin D3, 1,25(OH)2 indicates both endogenous production and supplementation.  Vitamin D2, 1,25(OH)2 is an indicator of exogeous sources, such as diet or supplementation.  Interpretation and therapy are based on measurement of Vitamin D,1,25(OH)2, Total. This test was developed and its analytical performance characteristics have been determined by [facility], [place], [place]. It has not been cleared or approved by the FDA. This assay has been validated pursuant to the CLIA regulations and is used for clinical purposes.     Assessment/Plan: No problem-specific Assessment & Plan notes found for this encounter.    Leeanne Rio, PA-C

## 2016-07-29 DIAGNOSIS — M25561 Pain in right knee: Secondary | ICD-10-CM | POA: Diagnosis not present

## 2016-07-29 DIAGNOSIS — M9906 Segmental and somatic dysfunction of lower extremity: Secondary | ICD-10-CM | POA: Diagnosis not present

## 2016-07-31 DIAGNOSIS — M791 Myalgia: Secondary | ICD-10-CM | POA: Diagnosis not present

## 2016-08-08 DIAGNOSIS — M25512 Pain in left shoulder: Secondary | ICD-10-CM | POA: Diagnosis not present

## 2016-08-08 DIAGNOSIS — M25561 Pain in right knee: Secondary | ICD-10-CM | POA: Diagnosis not present

## 2016-08-08 DIAGNOSIS — M179 Osteoarthritis of knee, unspecified: Secondary | ICD-10-CM | POA: Diagnosis not present

## 2016-08-08 DIAGNOSIS — M9906 Segmental and somatic dysfunction of lower extremity: Secondary | ICD-10-CM | POA: Diagnosis not present

## 2016-08-09 DIAGNOSIS — Z01419 Encounter for gynecological examination (general) (routine) without abnormal findings: Secondary | ICD-10-CM | POA: Diagnosis not present

## 2016-08-10 ENCOUNTER — Telehealth: Payer: Self-pay | Admitting: *Deleted

## 2016-08-10 NOTE — Telephone Encounter (Signed)
Patient states that she called our office 3 times (left voicemails on 5/25 and 5/28  and talked to "someone" on 5/29)   to cancel her appointment on 5/31.   She said that she still got a litter in the mail telling her that she did not show up for her appointment and she is not happy that we would send this after she attempted to cancel.   Patient is aware that I am forwarding message to office manager to review.   Also - Patient is leaving a VM EVERY time she calls the office instead of letting it ring to the front staff.  I reviewed this with her and asked that she let the automated message play through and then it would ring to front staff - advised that she not just call and leave VM's when she needs something.

## 2016-08-12 ENCOUNTER — Encounter: Payer: Self-pay | Admitting: Physician Assistant

## 2016-08-12 ENCOUNTER — Ambulatory Visit (INDEPENDENT_AMBULATORY_CARE_PROVIDER_SITE_OTHER): Payer: 59 | Admitting: Physician Assistant

## 2016-08-12 VITALS — BP 124/78 | HR 69 | Temp 98.3°F | Resp 14 | Ht 64.0 in | Wt 193.0 lb

## 2016-08-12 DIAGNOSIS — R0789 Other chest pain: Secondary | ICD-10-CM | POA: Diagnosis not present

## 2016-08-12 DIAGNOSIS — M9906 Segmental and somatic dysfunction of lower extremity: Secondary | ICD-10-CM | POA: Diagnosis not present

## 2016-08-12 DIAGNOSIS — M25561 Pain in right knee: Secondary | ICD-10-CM | POA: Diagnosis not present

## 2016-08-12 MED ORDER — IBUPROFEN 600 MG PO TABS: 600.0000 mg | ORAL_TABLET | Freq: Three times a day (TID) | ORAL | 0 refills | Status: DC | PRN

## 2016-08-12 MED ORDER — ROSUVASTATIN CALCIUM 20 MG PO TABS: ORAL_TABLET | ORAL | 1 refills | Status: DC

## 2016-08-12 MED ORDER — LOSARTAN POTASSIUM 25 MG PO TABS: 25.0000 mg | ORAL_TABLET | Freq: Two times a day (BID) | ORAL | 5 refills | Status: DC

## 2016-08-12 MED ORDER — METOPROLOL SUCCINATE ER 25 MG PO TB24: 75.0000 mg | ORAL_TABLET | Freq: Every day | ORAL | 5 refills | Status: DC

## 2016-08-12 NOTE — Patient Instructions (Addendum)
Please avoid heavy lifting or overexertion. Wear a supportive bra. Apply topical Icy Hot or Aspercreme to the area. Extra stength tylenol for pain since you are sensitive to anti-inflammatories. Symptoms should gradually resolve over the next 4-5 days.

## 2016-08-12 NOTE — Progress Notes (Signed)
Patient presents to clinic today c/o pain in left breast, upper chest and arm starting a few days ago. Patient endorses pain is aching and sore in nature and is worse with ROM. Denies rash. Denies SOB, palpitations, LH or dizziness. States pain started during her screening mammogram and has continued since that time. Has not taken anything for symptoms.   Past Medical History:  Diagnosis Date  . Anxiety   . Asthma   . Chronic high back pain   . Double vision   . Fibromyalgia   . Heart murmur   . High cholesterol   . Hypertension   . Hypoglycemia   . Kidney stone   . Meniscus tear    Right  . Migraine 08/07/2015  . PTSD (post-traumatic stress disorder)   . Schizophrenia ([place])    treated by Dr. Casimiro Needle  . Seizures ([place])    last seizure 3 years ago, not on AED  . Stroke ([place]) 07/08/11   "I've had mini strokes before; left side of face  is more down than right "  . Syncope and collapse    One episode - 07/10/15    Current Outpatient Prescriptions on File Prior to Visit  Medication Sig Dispense Refill  . ADVAIR HFA 115-21 MCG/ACT inhaler INHALE ONE PUFF BY MOUTH TWICE DAILY 12 g 5  . albuterol (PROVENTIL HFA;VENTOLIN HFA) 108 (90 Base) MCG/ACT inhaler Inhale 2 puffs into the lungs every 6 (six) hours as needed for wheezing or shortness of breath. 1 Inhaler 2  . azelastine (ASTELIN) 0.1 % nasal spray Place 1 spray into both nostrils 2 (two) times daily. Use in each nostril as directed 30 mL 12  . fluticasone (FLONASE) 50 MCG/ACT nasal spray USE TWO SPRAY(S) IN EACH NOSTRIL ONCE DAILY 16 g 5  . gabapentin (NEURONTIN) 100 MG capsule TAKE TWO CAPSULES BY MOUTH IN THE MORNING, TWO CAPSULES AT NOON, AND THREE CAPSULES IN THE EVENING 210 capsule 3  . hydrocortisone 2.5 % cream     . ketoconazole (NIZORAL) 2 % cream     . losartan (COZAAR) 25 MG tablet TAKE ONE TABLET BY MOUTH TWICE DAILY 60 tablet 5  . metoprolol succinate (TOPROL-XL) 25 MG 24 hr tablet Take 75 mg by mouth daily. Take 3  tablets daily (75mg  )    . Multiple Vitamin (THERA) TABS Take by mouth.    . orphenadrine (NORFLEX) 100 MG tablet Take 1 tablet (100 mg total) by mouth 2 (two) times daily as needed for muscle spasms. 60 tablet 0  . promethazine (PHENERGAN) 12.5 MG tablet Take 1 tablet (12.5 mg total) by mouth every 6 (six) hours as needed for nausea. 20 tablet 0  . rosuvastatin (CRESTOR) 20 MG tablet TAKE ONE TABLET BY MOUTH ONCE DAILY IN THE EVENING 90 tablet 1  . triamcinolone cream (KENALOG) 0.1 %     . zolpidem (AMBIEN) 10 MG tablet Take 2 tablets by mouth at bedtime.      No current facility-administered medications on file prior to visit.     Allergies  Allergen Reactions  . Sulfa Antibiotics Hives  . Famciclovir Other (See Comments)  . Metronidazole Nausea And Vomiting  . Soma [Carisoprodol] Other (See Comments)    Moody and sleepy Cognitive Function, Daytime sleepiness  . Acyclovir And Related   . Benadryl [Diphenhydramine Hcl]     Severe emotional reaction and doesn't work well with Pt. Past history  . Cephalosporins Hives  . Codeine Other (See Comments)    Makes  patient feel odd ; "sometimes mild; sometimes severe reaction; mostly severe"  . Cyclobenzaprine Other (See Comments)    Muscle Aches.  . Diclofenac Sodium Nausea And Vomiting  . Linzess [Linaclotide] Diarrhea  . Nitrofurantoin Monohyd Macro     Felt funny  . Prednisone Other (See Comments)    migraine  . Tizanidine     Other reaction(s): Other (See Comments) insomnia  . Baclofen Nausea Only  . Meloxicam     Other reaction(s): Confusion  . Sulfamethoxazole Rash    Family History  Problem Relation Age of Onset  . Heart attack Father 10       Living  . Arthritis Father   . Skin cancer Father   . Hypertension Father   . Hyperlipidemia Father   . Leukemia Mother 74       Deceased  . Alzheimer's disease Paternal Aunt        X2  . Stomach cancer Paternal Aunt        x1  . Arthritis/Rheumatoid Paternal Aunt         x1  . Neuropathy Brother        Peripheal  . Fibromyalgia Sister   . HIV Sister   . Arthritis/Rheumatoid Sister   . Diabetes Paternal Grandmother     Social History   Social History  . Marital status: Married    Spouse name: N/A  . Number of children: 3  . Years of education: 2 yrs coll   Occupational History  . Disabled    Social History Main Topics  . Smoking status: Former Smoker    Packs/day: 1.00    Years: 20.00    Types: Cigarettes    Quit date: 02/29/2004  . Smokeless tobacco: Never Used  . Alcohol use 0.0 oz/week     Comment: "glass of wine q once in awhile; not very often; do it on special occasion"  . Drug use: No  . Sexual activity: No   Other Topics Concern  . None   Social History Narrative   Completed 2 years of college.    On disability.   Lives at home with her husband.   No caffeine use.   Left-handed.   Three children, 2 biological - 1 adopted.    Review of Systems - See HPI.  All other ROS are negative.  BP 124/78   Pulse 69   Temp 98.3 F (36.8 C) (Oral)   Resp 14   Ht 5\' 4"  (1.626 m)   Wt 193 lb (87.5 kg)   SpO2 98%   BMI 33.13 kg/m   Physical Exam  Constitutional: She is well-developed, well-nourished, and in no distress.  HENT:  Head: Normocephalic and atraumatic.  Eyes: Conjunctivae are normal.  Neck: Neck supple.  Cardiovascular: Normal rate, regular rhythm, normal heart sounds and intact distal pulses.   Pulmonary/Chest: Effort normal and breath sounds normal. No respiratory distress. She has no wheezes. She has no rales.  Chaperone present for examination. On visual examination no evidence of trauma or injury noted. Breast are symmetrical without skin changes or nipple inversion. Examination of left without mass or induration. There is tenderness to palpation over L pectoral muscle from upper breast to clavicular area. Pain is reproduced with ROM of arm.  Vitals reviewed.   Recent Results (from the past 2160 hour(s))  TSH      Status: None   Collection Time: 06/14/16  9:31 AM  Result Value Ref Range   TSH 2.76 0.35 - 4.50  uIU/mL  T4, free     Status: None   Collection Time: 06/14/16  9:31 AM  Result Value Ref Range   Free T4 0.88 0.60 - 1.60 ng/dL    Comment: Specimens from patients who are undergoing biotin therapy and /or ingesting biotin supplements may contain high levels of biotin.  The higher biotin concentration in these specimens interferes with this Free T4 assay.  Specimens that contain high levels  of biotin may cause false high results for this Free T4 assay.  Please interpret results in light of the total clinical presentation of the patient.    Vitamin D 1,25 dihydroxy     Status: None   Collection Time: 06/14/16  9:31 AM  Result Value Ref Range   Vitamin D 1, 25 (OH)2 Total 34 18 - 72 pg/mL   Vitamin D3 1, 25 (OH)2 34 pg/mL   Vitamin D2 1, 25 (OH)2 <8 pg/mL    Comment: Vitamin D3, 1,25(OH)2 indicates both endogenous production and supplementation.  Vitamin D2, 1,25(OH)2 is an indicator of exogeous sources, such as diet or supplementation.  Interpretation and therapy are based on measurement of Vitamin D,1,25(OH)2, Total. This test was developed and its analytical performance characteristics have been determined by [facility], [place], [place]. It has not been cleared or approved by the FDA. This assay has been validated pursuant to the CLIA regulations and is used for clinical purposes.     Assessment/Plan: 1. Chest wall tenderness Muscular due to trauma from mammogram. Muscular soreness developing during procedure and worsening over the next couple of days. No evidence of mass or infection. No reason to suspect cardiac cause of pain. Pain reproducible with palpation and ROM. Will start tylenol alternating with ibuprofen (patient can tolerate ibuprofen but not other NSAIDs). Continue Norflex. Supportive measures and topical medications reviewed. Follow-up if symptoms  are not gradually resolving.    Leeanne Rio, PA-C

## 2016-08-12 NOTE — Progress Notes (Signed)
Pre visit review using our clinic review tool, if applicable. No additional management support is needed unless otherwise documented below in the visit note. 

## 2016-08-16 DIAGNOSIS — M25561 Pain in right knee: Secondary | ICD-10-CM | POA: Diagnosis not present

## 2016-08-16 DIAGNOSIS — M25512 Pain in left shoulder: Secondary | ICD-10-CM | POA: Diagnosis not present

## 2016-08-16 DIAGNOSIS — M179 Osteoarthritis of knee, unspecified: Secondary | ICD-10-CM | POA: Diagnosis not present

## 2016-08-17 DIAGNOSIS — M25561 Pain in right knee: Secondary | ICD-10-CM | POA: Diagnosis not present

## 2016-08-17 DIAGNOSIS — M9906 Segmental and somatic dysfunction of lower extremity: Secondary | ICD-10-CM | POA: Diagnosis not present

## 2016-08-18 DIAGNOSIS — M25512 Pain in left shoulder: Secondary | ICD-10-CM | POA: Diagnosis not present

## 2016-08-18 DIAGNOSIS — M25561 Pain in right knee: Secondary | ICD-10-CM | POA: Diagnosis not present

## 2016-08-18 DIAGNOSIS — M179 Osteoarthritis of knee, unspecified: Secondary | ICD-10-CM | POA: Diagnosis not present

## 2016-08-19 DIAGNOSIS — M9906 Segmental and somatic dysfunction of lower extremity: Secondary | ICD-10-CM | POA: Diagnosis not present

## 2016-08-19 DIAGNOSIS — M25561 Pain in right knee: Secondary | ICD-10-CM | POA: Diagnosis not present

## 2016-08-23 ENCOUNTER — Telehealth: Payer: Self-pay | Admitting: *Deleted

## 2016-08-23 DIAGNOSIS — R0789 Other chest pain: Secondary | ICD-10-CM

## 2016-08-23 NOTE — Telephone Encounter (Signed)
Please call patient to assess further. If she had a completed mammogram then she should be fine if mammogram was normal. I have not received any results -- please ask patient who ordered the testing (GYN?). If the mammogram could not be completed then we will need to order screening ultrasounds if she is refusing repeat mammography.

## 2016-08-23 NOTE — Telephone Encounter (Signed)
Patient states that she spoke to the provider [name] during her last visit and they talked about scheduling a Ultrasound for her. Patient states that she was not told by technician that did her mammogram that she would need another one. Patient call back number to get clarification is [phone]. Please advise. Thank you

## 2016-08-24 ENCOUNTER — Encounter: Payer: Self-pay | Admitting: Neurology

## 2016-08-24 ENCOUNTER — Ambulatory Visit (INDEPENDENT_AMBULATORY_CARE_PROVIDER_SITE_OTHER): Payer: 59 | Admitting: Neurology

## 2016-08-24 VITALS — BP 129/74 | HR 79 | Ht 64.0 in | Wt 197.5 lb

## 2016-08-24 DIAGNOSIS — G4486 Cervicogenic headache: Secondary | ICD-10-CM

## 2016-08-24 DIAGNOSIS — M797 Fibromyalgia: Secondary | ICD-10-CM | POA: Diagnosis not present

## 2016-08-24 DIAGNOSIS — M25561 Pain in right knee: Secondary | ICD-10-CM | POA: Diagnosis not present

## 2016-08-24 DIAGNOSIS — R51 Headache: Secondary | ICD-10-CM

## 2016-08-24 DIAGNOSIS — M25512 Pain in left shoulder: Secondary | ICD-10-CM | POA: Diagnosis not present

## 2016-08-24 DIAGNOSIS — G43909 Migraine, unspecified, not intractable, without status migrainosus: Secondary | ICD-10-CM | POA: Diagnosis not present

## 2016-08-24 DIAGNOSIS — M179 Osteoarthritis of knee, unspecified: Secondary | ICD-10-CM | POA: Diagnosis not present

## 2016-08-24 HISTORY — DX: Cervicogenic headache: G44.86

## 2016-08-24 MED ORDER — GABAPENTIN 300 MG PO CAPS: ORAL_CAPSULE | ORAL | 3 refills | Status: DC

## 2016-08-24 NOTE — Telephone Encounter (Signed)
Spoke with patient. Referral to PT -- [facility] -- placed for therapeutic [place].  Will follow-up with this.

## 2016-08-24 NOTE — Telephone Encounter (Signed)
I am in clinic and will not have time to call patient until lunch at the earliest. Jasmine [name] please call patient to assess her concerns so that we can expedite a solution for her.

## 2016-08-24 NOTE — Patient Instructions (Signed)
   We will set up physical therapy, and increase the gabapentin to 300 mg three times a day for 1 week, then take one capsule in the morning and midday and 2 at night, call for any dose adjustments.

## 2016-08-24 NOTE — Telephone Encounter (Signed)
Attempted to reach patient. No answer. LMOVM for callback. I am still seeing patients in clinic this morning but will await her return call.

## 2016-08-24 NOTE — Telephone Encounter (Signed)
Pt calling this morning asking for a call back from [place] himself, would not tell me what call was regarding to that she just wanted to talk to [facility]

## 2016-08-24 NOTE — Telephone Encounter (Signed)
Spoke with patient and she doesn't want to talk or give me any information. It is pertaining to her last visit with you. She is wanting a call from you personally. I advised it would be around lunch time when you had time. She was agreeable.

## 2016-08-24 NOTE — Progress Notes (Signed)
Reason for visit: Fibromyalgia, headache  Jasmine [name] is an 61 y.o. female  History of present illness:  Jasmine [name] is a 61 year old left handed white female with a history of headaches and fibromyalgia. The patient has headaches that come up the back of the head to the top of the head and she has significant neck pain and neck stiffness and shoulder discomfort. The patient has recently undergone some physical therapy that was not specifically centered on the neck and shoulders. The patient has chronic low back pain as well without radiation down the legs. She has a cochlear implant and cannot have MRI evaluation. She has not had recent evaluation of the cervical spine. She does have episodes of dizziness and vertigo at times, but this has not been a recent problem for her. She claims that over the last 6 weeks she has had headaches for 4 weeks. The headaches are not disabling but are constant in nature. She is on gabapentin in low dose taking the 100 mg capsules. She has quite a number of medication intolerances and allergies, she cannot take many of the muscle relaxants such as baclofen or Flexeril. She returns to this office for an evaluation. She apparently has been seen recently by Dr. Posey Pronto from [facility] neurology, but they apparently do not treat issues associated with fibromyalgia.  Past Medical History:  Diagnosis Date  . Anxiety   . Asthma   . Chronic high back pain   . Double vision   . Fibromyalgia   . Heart murmur   . High cholesterol   . Hypertension   . Hypoglycemia   . Kidney stone   . Meniscus tear    Right  . Migraine 08/07/2015  . PTSD (post-traumatic stress disorder)   . Schizophrenia ([place])    treated by Dr. Casimiro Needle  . Seizures ([place])    last seizure 3 years ago, not on AED  . Stroke ([place]) 07/08/11   "I've had mini strokes before; left side of face  is more down than right "  . Syncope and collapse    One episode - 07/10/15    Past Surgical History:  Procedure  Laterality Date  . COCHLEAR IMPLANT  2010   left  . [place]   left  . KIDNEY STONE SURGERY  ~ 2006  . TONSILLECTOMY     "as a child"  . WISDOM TOOTH EXTRACTION      Family History  Problem Relation Age of Onset  . Heart attack Father 86       Living  . Arthritis Father   . Skin cancer Father   . Hypertension Father   . Hyperlipidemia Father   . Leukemia Mother 70       Deceased  . Alzheimer's disease Paternal Aunt        X2  . Stomach cancer Paternal Aunt        x1  . Arthritis/Rheumatoid Paternal Aunt        x1  . Neuropathy Brother        Peripheal  . Fibromyalgia Sister   . HIV Sister   . Arthritis/Rheumatoid Sister   . Diabetes Paternal Grandmother     Social history:  reports that she quit smoking about 12 years ago. Her smoking use included Cigarettes. She has a 20.00 pack-year smoking history. She has never used smokeless tobacco. She reports that she drinks alcohol. She reports that she does not use drugs.  Allergies  Allergen Reactions  . Sulfa Antibiotics Hives  . Famciclovir Other (See Comments)  . Metronidazole Nausea And Vomiting  . Soma [Carisoprodol] Other (See Comments)    Moody and sleepy Cognitive Function, Daytime sleepiness  . Acyclovir And Related   . Benadryl [Diphenhydramine Hcl]     Severe emotional reaction and doesn't work well with Pt. Past history  . Cephalosporins Hives  . Codeine Other (See Comments)    Makes patient feel odd ; "sometimes mild; sometimes severe reaction; mostly severe"  . Cyclobenzaprine Other (See Comments)    Muscle Aches.  . Diclofenac Sodium Nausea And Vomiting  . Linzess [Linaclotide] Diarrhea  . Nitrofurantoin Monohyd Macro     Felt funny  . Prednisone Other (See Comments)    migraine  . Tizanidine     Other reaction(s): Other (See Comments) insomnia  . Baclofen Nausea Only  . Meloxicam     Other reaction(s): Confusion  . Sulfamethoxazole Rash    Medications:  Prior to  Admission medications   Medication Sig Start Date End Date Taking? Authorizing Provider  ADVAIR HFA 115-21 MCG/ACT inhaler INHALE ONE PUFF BY MOUTH TWICE DAILY 01/28/16  Yes Brunetta Jeans, PA-C  albuterol (PROVENTIL HFA;VENTOLIN HFA) 108 (90 Base) MCG/ACT inhaler Inhale 2 puffs into the lungs every 6 (six) hours as needed for wheezing or shortness of breath. 05/06/16  Yes Brunetta Jeans, PA-C  azelastine (ASTELIN) 0.1 % nasal spray Place 1 spray into both nostrils 2 (two) times daily. Use in each nostril as directed 03/31/16  Yes Brunetta Jeans, PA-C  fluticasone [facility]) 50 MCG/ACT nasal spray USE TWO SPRAY(S) IN EACH NOSTRIL ONCE DAILY 11/17/15  Yes Brunetta Jeans, PA-C  gabapentin (NEURONTIN) 100 MG capsule TAKE TWO CAPSULES BY MOUTH IN THE MORNING, TWO CAPSULES AT NOON, AND THREE CAPSULES IN THE EVENING 07/22/16  Yes Brunetta Jeans, PA-C  hydrocortisone 2.5 % cream  07/13/16  Yes [provider]  ibuprofen (ADVIL,MOTRIN) 600 MG tablet Take 1 tablet (600 mg total) by mouth every 8 (eight) hours as needed. 08/12/16  Yes Brunetta Jeans, PA-C  ketoconazole (NIZORAL) 2 % cream  07/13/16  Yes [provider]  losartan (COZAAR) 25 MG tablet Take 1 tablet (25 mg total) by mouth 2 (two) times daily. 08/12/16  Yes Brunetta Jeans, PA-C  metoprolol succinate (TOPROL-XL) 25 MG 24 hr tablet Take 3 tablets (75 mg total) by mouth daily. Take 3 tablets daily (75mg  ) 08/12/16  Yes Brunetta Jeans, PA-C  Multiple Vitamin (THERA) TABS Take by mouth.   Yes [provider]  orphenadrine (NORFLEX) 100 MG tablet Take 1 tablet (100 mg total) by mouth 2 (two) times daily as needed for muscle spasms. 06/28/16  Yes Brunetta Jeans, PA-C  promethazine (PHENERGAN) 12.5 MG tablet Take 1 tablet (12.5 mg total) by mouth every 6 (six) hours as needed for nausea. 04/22/16  Yes Brunetta Jeans, PA-C  rosuvastatin (CRESTOR) 20 MG tablet TAKE ONE TABLET BY MOUTH ONCE DAILY IN THE EVENING  08/12/16  Yes Brunetta Jeans, PA-C  temazepam (RESTORIL) 15 MG capsule Take 2 capsules by mouth at bedtime. 08/15/16  Yes [provider]  triamcinolone cream (KENALOG) 0.1 %  07/13/16  Yes [provider]  zolpidem (AMBIEN) 10 MG tablet Take 2 tablets by mouth at bedtime.  04/06/16  Yes [provider]    ROS:  Out of a complete 14 system review of symptoms, the patient complains only of  the following symptoms, and all other reviewed systems are negative.  Hearing loss Insomnia Back pain, achy muscles, muscle cramps, neck pain Memory loss  Blood pressure 129/74, pulse 79, height 5\' 4"  (1.626 m), weight 197 lb 8 oz (89.6 kg).  Physical Exam  General: The patient is alert and cooperative at the time of the examination.   Neuromuscular: The patient has a significant restriction of rotational movement of the neck, able to turn her head only about 15 bilaterally.  Skin: No significant peripheral edema is noted.   Neurologic Exam  Mental status: The patient is alert and oriented x 3 at the time of the examination. The patient has apparent normal recent and remote memory, with an apparently normal attention span and concentration ability.   Cranial nerves: Facial symmetry is present. Speech is normal, no aphasia or dysarthria is noted. Extraocular movements are full. Visual fields are full.  Motor: The patient has good strength in all 4 extremities.  Sensory examination: Soft touch sensation is symmetric on the face, arms, and legs.  Coordination: The patient has good finger-nose-finger and heel-to-shin bilaterally.  Gait and station: The patient has a normal gait. Tandem gait is unsteady. Romberg is negative. No drift is seen.  Reflexes: Deep tendon reflexes are symmetric.   Assessment/Plan:  1. Fibromyalgia  2. Cervicogenic headache  3. Episodic vertigo  The patient is on a relatively low dose of gabapentin. We will increase the dose to 300 mg  3 times daily for one week and then go to 300 mg twice during the day and 600 mg in the evening. The patient will be set up for physical therapy to work on the neck and shoulders, if she is not improving with these therapies, we will consider a CT scan of the cervical spine. She will follow-up in about 4 months.  Jill Alexanders MD 08/24/2016 9:19 AM  [facility] [address] [place], [place] [address]  Phone [phone] Fax [phone]

## 2016-08-24 NOTE — Telephone Encounter (Signed)
Pt called back stating that she missed a call and would like a call back.

## 2016-08-26 ENCOUNTER — Encounter: Payer: Self-pay | Admitting: Physician Assistant

## 2016-08-26 ENCOUNTER — Ambulatory Visit (INDEPENDENT_AMBULATORY_CARE_PROVIDER_SITE_OTHER): Payer: 59 | Admitting: Physician Assistant

## 2016-08-26 ENCOUNTER — Telehealth: Payer: Self-pay | Admitting: Physician Assistant

## 2016-08-26 VITALS — BP 130/86 | HR 73 | Temp 98.9°F | Resp 14 | Ht 64.0 in | Wt 198.0 lb

## 2016-08-26 DIAGNOSIS — K224 Dyskinesia of esophagus: Secondary | ICD-10-CM | POA: Diagnosis not present

## 2016-08-26 MED ORDER — OMEPRAZOLE 20 MG PO CPDR: 20.0000 mg | DELAYED_RELEASE_CAPSULE | Freq: Every day | ORAL | 3 refills | Status: DC

## 2016-08-26 MED ORDER — GI COCKTAIL ~~LOC~~: 30.0000 mL | Freq: Once | ORAL | Status: DC

## 2016-08-26 NOTE — Telephone Encounter (Signed)
Patient Name: Jasmine [name]  DOB: 1955-07-01    Initial Comment cough, asthma attack last night   Nurse Assessment  Nurse: Mallie Mussel, RN, Alveta Heimlich Date/Time Eilene Ghazi Time): 08/26/2016 7:51:37 AM  Confirm and document reason for call. If symptomatic, describe symptoms. ---Caller states that she has had a cough for the past 2 days. Her cough is dry. She had an asthma attack last night. She has coughed so much she is hoarse. Denies difficulty breathing at present. Denies fever. Denies congestion.  Does the patient have any new or worsening symptoms? ---Yes  Will a triage be completed? ---Yes  Related visit to physician within the last 2 weeks? ---No  Does the PT have any chronic conditions? (i.e. diabetes, asthma, etc.) ---Yes  List chronic conditions. ---Asthma, HTN, Hypercholesterolemia  Is this a behavioral health or substance abuse call? ---No     Guidelines    Guideline Title Affirmed Question Affirmed Notes  Asthma Attack Asthma limits exercise or sports    Final Disposition User   See PCP within [place], RN, Alveta Heimlich    Comments  Caller wants to be seen this morning if possible. I was able to schedule her to be seen at 1:00pm with Elyn Aquas PA   Referrals  REFERRED TO PCP OFFICE   Disagree/Comply: Comply

## 2016-08-26 NOTE — Patient Instructions (Signed)
Please take the Prilosec daily. Also take an over-the-counter Zantac twice daily for a couple of days to help with reflux. Delsym for cough. Symptoms should calm down and resolve.  If still occurring or any worsening symptoms you would need to go to the ER for assessment.

## 2016-08-26 NOTE — Progress Notes (Signed)
Patient presents to clinic today c/o 2 days of dry cough associated with intermittent esophageal discomfort and globus sensation. Notes symptoms are worse first thing in the morning and when lying down at night. Noted an episode last night where the cough and esophagus pain caused her to feel winded. Denies wheezing. Denies fever, chills or other URI symptoms. Notes some heartburn with symptoms.   Past Medical History:  Diagnosis Date  . Anxiety   . Asthma   . Cervicogenic headache 08/24/2016  . Chronic high back pain   . Double vision   . Fibromyalgia   . Heart murmur   . High cholesterol   . Hypertension   . Hypoglycemia   . Kidney stone   . Meniscus tear    Right  . Migraine 08/07/2015  . PTSD (post-traumatic stress disorder)   . Schizophrenia ([place])    treated by Dr. Casimiro Needle  . Seizures ([place])    last seizure 3 years ago, not on AED  . Stroke ([place]) 07/08/11   "I've had mini strokes before; left side of face  is more down than right "  . Syncope and collapse    One episode - 07/10/15    Current Outpatient Prescriptions on File Prior to Visit  Medication Sig Dispense Refill  . ADVAIR HFA 115-21 MCG/ACT inhaler INHALE ONE PUFF BY MOUTH TWICE DAILY 12 g 5  . albuterol (PROVENTIL HFA;VENTOLIN HFA) 108 (90 Base) MCG/ACT inhaler Inhale 2 puffs into the lungs every 6 (six) hours as needed for wheezing or shortness of breath. 1 Inhaler 2  . azelastine (ASTELIN) 0.1 % nasal spray Place 1 spray into both nostrils 2 (two) times daily. Use in each nostril as directed 30 mL 12  . fluticasone (FLONASE) 50 MCG/ACT nasal spray USE TWO SPRAY(S) IN EACH NOSTRIL ONCE DAILY 16 g 5  . gabapentin (NEURONTIN) 300 MG capsule One capsule in the morning and midday and 2 at night 120 capsule 3  . hydrocortisone 2.5 % cream     . ibuprofen (ADVIL,MOTRIN) 600 MG tablet Take 1 tablet (600 mg total) by mouth every 8 (eight) hours as needed. 30 tablet 0  . ketoconazole (NIZORAL) 2 % cream     . losartan  (COZAAR) 25 MG tablet Take 1 tablet (25 mg total) by mouth 2 (two) times daily. 60 tablet 5  . metoprolol succinate (TOPROL-XL) 25 MG 24 hr tablet Take 3 tablets (75 mg total) by mouth daily. Take 3 tablets daily (75mg  ) 90 tablet 5  . Multiple Vitamin (THERA) TABS Take by mouth.    . orphenadrine (NORFLEX) 100 MG tablet Take 1 tablet (100 mg total) by mouth 2 (two) times daily as needed for muscle spasms. 60 tablet 0  . promethazine (PHENERGAN) 12.5 MG tablet Take 1 tablet (12.5 mg total) by mouth every 6 (six) hours as needed for nausea. 20 tablet 0  . rosuvastatin (CRESTOR) 20 MG tablet TAKE ONE TABLET BY MOUTH ONCE DAILY IN THE EVENING 90 tablet 1  . temazepam (RESTORIL) 15 MG capsule Take 2 capsules by mouth at bedtime.    . triamcinolone cream (KENALOG) 0.1 %     . zolpidem (AMBIEN) 10 MG tablet Take 2 tablets by mouth at bedtime.      No current facility-administered medications on file prior to visit.     Allergies  Allergen Reactions  . Sulfa Antibiotics Hives  . Famciclovir Other (See Comments)  . Metronidazole Nausea And Vomiting  . Soma [Carisoprodol] Other (  See Comments)    Moody and sleepy Cognitive Function, Daytime sleepiness  . Acyclovir And Related   . Benadryl [Diphenhydramine Hcl]     Severe emotional reaction and doesn't work well with Pt. Past history  . Cephalosporins Hives  . Codeine Other (See Comments)    Makes patient feel odd ; "sometimes mild; sometimes severe reaction; mostly severe"  . Cyclobenzaprine Other (See Comments)    Muscle Aches.  . Diclofenac Sodium Nausea And Vomiting  . Linzess [Linaclotide] Diarrhea  . Nitrofurantoin Monohyd Macro     Felt funny  . Prednisone Other (See Comments)    migraine  . Tizanidine     Other reaction(s): Other (See Comments) insomnia  . Baclofen Nausea Only  . Meloxicam     Other reaction(s): Confusion  . Sulfamethoxazole Rash    Family History  Problem Relation Age of Onset  . Heart attack Father 61        Living  . Arthritis Father   . Skin cancer Father   . Hypertension Father   . Hyperlipidemia Father   . Leukemia Mother 81       Deceased  . Alzheimer's disease Paternal Aunt        X2  . Stomach cancer Paternal Aunt        x1  . Arthritis/Rheumatoid Paternal Aunt        x1  . Neuropathy Brother        Peripheal  . Fibromyalgia Sister   . HIV Sister   . Arthritis/Rheumatoid Sister   . Diabetes Paternal Grandmother     Social History   Social History  . Marital status: Married    Spouse name: N/A  . Number of children: 3  . Years of education: 2 yrs coll   Occupational History  . Disabled    Social History Main Topics  . Smoking status: Former Smoker    Packs/day: 1.00    Years: 20.00    Types: Cigarettes    Quit date: 02/29/2004  . Smokeless tobacco: Never Used  . Alcohol use 0.0 oz/week     Comment: "glass of wine q once in awhile; not very often; do it on special occasion"  . Drug use: No  . Sexual activity: No   Other Topics Concern  . None   Social History Narrative   Completed 2 years of college.    On disability.   Lives at home with her husband.   No caffeine use.   Left-handed.   Three children, 2 biological - 1 adopted.   Review of Systems - See HPI.  All other ROS are negative.  BP 130/86   Pulse 73   Temp 98.9 F (37.2 C) (Oral)   Resp 14   Ht 5\' 4"  (1.626 m)   Wt 198 lb (89.8 kg)   SpO2 98%   BMI 33.99 kg/m   Physical Exam  Constitutional: She is oriented to person, place, and time and well-developed, well-nourished, and in no distress.  HENT:  Head: Normocephalic and atraumatic.  Eyes: Conjunctivae are normal.  Neck: Neck supple.  Cardiovascular: Normal rate, regular rhythm, normal heart sounds and intact distal pulses.   Pulmonary/Chest: Effort normal and breath sounds normal. No respiratory distress. She has no wheezes. She has no rales. She exhibits no tenderness.  Abdominal: Soft. Bowel sounds are normal. She exhibits no  distension. There is no tenderness.  Neurological: She is alert and oriented to person, place, and time.  Skin: Skin is  warm and dry. No rash noted.  Psychiatric: Affect normal.  Vitals reviewed.   Recent Results (from the past 2160 hour(s))  TSH     Status: None   Collection Time: 06/14/16  9:31 AM  Result Value Ref Range   TSH 2.76 0.35 - 4.50 uIU/mL  T4, free     Status: None   Collection Time: 06/14/16  9:31 AM  Result Value Ref Range   Free T4 0.88 0.60 - 1.60 ng/dL    Comment: Specimens from patients who are undergoing biotin therapy and /or ingesting biotin supplements may contain high levels of biotin.  The higher biotin concentration in these specimens interferes with this Free T4 assay.  Specimens that contain high levels  of biotin may cause false high results for this Free T4 assay.  Please interpret results in light of the total clinical presentation of the patient.    Vitamin D 1,25 dihydroxy     Status: None   Collection Time: 06/14/16  9:31 AM  Result Value Ref Range   Vitamin D 1, 25 (OH)2 Total 34 18 - 72 pg/mL   Vitamin D3 1, 25 (OH)2 34 pg/mL   Vitamin D2 1, 25 (OH)2 <8 pg/mL    Comment: Vitamin D3, 1,25(OH)2 indicates both endogenous production and supplementation.  Vitamin D2, 1,25(OH)2 is an indicator of exogeous sources, such as diet or supplementation.  Interpretation and therapy are based on measurement of Vitamin D,1,25(OH)2, Total. This test was developed and its analytical performance characteristics have been determined by [facility], [place], [place]. It has not been cleared or approved by the FDA. This assay has been validated pursuant to the CLIA regulations and is used for clinical purposes.     Assessment/Plan: 1. Esophageal spasm EKG obtained -- NSR with t wave abnormalities, unchanged from prior tracings. Symptoms most consistent with GERD and esophageal spasm. GI cocktail given with significant relief of symptoms.  Start Prilosec. Supportive measures reviewed. Strict ER precautions reviewed with patient and son. - EKG 12-Lead - gi cocktail (Maalox,Lidocaine,Donnatal); Take 30 mLs by mouth once.   Leeanne Rio, PA-C

## 2016-08-26 NOTE — Telephone Encounter (Signed)
FYI

## 2016-08-26 NOTE — Addendum Note (Signed)
Addended by: Brunetta Jeans on: 08/26/2016 04:43 PM   Modules accepted: Level of Service

## 2016-08-26 NOTE — Progress Notes (Signed)
Pre visit review using our clinic review tool, if applicable. No additional management support is needed unless otherwise documented below in the visit note. 

## 2016-08-29 DIAGNOSIS — M25561 Pain in right knee: Secondary | ICD-10-CM | POA: Diagnosis not present

## 2016-08-29 DIAGNOSIS — M25512 Pain in left shoulder: Secondary | ICD-10-CM | POA: Diagnosis not present

## 2016-08-29 DIAGNOSIS — M179 Osteoarthritis of knee, unspecified: Secondary | ICD-10-CM | POA: Diagnosis not present

## 2016-08-30 DIAGNOSIS — M791 Myalgia: Secondary | ICD-10-CM | POA: Diagnosis not present

## 2016-09-01 ENCOUNTER — Telehealth: Payer: Self-pay | Admitting: Physician Assistant

## 2016-09-01 ENCOUNTER — Emergency Department (HOSPITAL_BASED_OUTPATIENT_CLINIC_OR_DEPARTMENT_OTHER)
Admission: EM | Admit: 2016-09-01 | Discharge: 2016-09-01 | Disposition: A | Discharge status: Home / Self Care | Payer: 59 | Attending: Emergency Medicine | Admitting: Emergency Medicine

## 2016-09-01 ENCOUNTER — Emergency Department (HOSPITAL_BASED_OUTPATIENT_CLINIC_OR_DEPARTMENT_OTHER): Payer: 59

## 2016-09-01 ENCOUNTER — Telehealth: Payer: Self-pay

## 2016-09-01 ENCOUNTER — Encounter (HOSPITAL_BASED_OUTPATIENT_CLINIC_OR_DEPARTMENT_OTHER): Payer: Self-pay | Admitting: *Deleted

## 2016-09-01 DIAGNOSIS — I10 Essential (primary) hypertension: Secondary | ICD-10-CM | POA: Insufficient documentation

## 2016-09-01 DIAGNOSIS — J45909 Unspecified asthma, uncomplicated: Secondary | ICD-10-CM | POA: Insufficient documentation

## 2016-09-01 DIAGNOSIS — F17200 Nicotine dependence, unspecified, uncomplicated: Secondary | ICD-10-CM | POA: Diagnosis not present

## 2016-09-01 DIAGNOSIS — R278 Other lack of coordination: Secondary | ICD-10-CM | POA: Insufficient documentation

## 2016-09-01 DIAGNOSIS — R072 Precordial pain: Secondary | ICD-10-CM | POA: Insufficient documentation

## 2016-09-01 DIAGNOSIS — R279 Unspecified lack of coordination: Secondary | ICD-10-CM

## 2016-09-01 DIAGNOSIS — Z79899 Other long term (current) drug therapy: Secondary | ICD-10-CM | POA: Diagnosis not present

## 2016-09-01 DIAGNOSIS — R531 Weakness: Secondary | ICD-10-CM | POA: Diagnosis not present

## 2016-09-01 DIAGNOSIS — R079 Chest pain, unspecified: Secondary | ICD-10-CM | POA: Diagnosis present

## 2016-09-01 LAB — COMPREHENSIVE METABOLIC PANEL
ALT: 18 U/L (ref 14–54)
AST: 18 U/L (ref 15–41)
Albumin: 4 g/dL (ref 3.5–5.0)
Alkaline Phosphatase: 68 U/L (ref 38–126)
Anion gap: 8 (ref 5–15)
BUN: 15 mg/dL (ref 6–20)
CHLORIDE: 104 mmol/L (ref 101–111)
CO2: 26 mmol/L (ref 22–32)
CREATININE: 0.61 mg/dL (ref 0.44–1.00)
Calcium: 9 mg/dL (ref 8.9–10.3)
GFR calc Af Amer: >60 mL/min (ref >60)
Glucose, Bld: 149 mg/dL — ABNORMAL HIGH (ref 65–99)
POTASSIUM: 3.9 mmol/L (ref 3.5–5.1)
Sodium: 138 mmol/L (ref 135–145)
TOTAL PROTEIN: 7.4 g/dL (ref 6.5–8.1)
Total Bilirubin: 0.8 mg/dL (ref 0.3–1.2)

## 2016-09-01 LAB — TROPONIN I

## 2016-09-01 LAB — CBC
HCT: 40.3 % (ref 36.0–46.0)
Hemoglobin: 13.5 g/dL (ref 12.0–15.0)
MCH: 28.6 pg (ref 26.0–34.0)
MCHC: 33.5 g/dL (ref 30.0–36.0)
MCV: 85.4 fL (ref 78.0–100.0)
PLATELETS: 257 10*3/uL (ref 150–400)
RBC: 4.72 MIL/uL (ref 3.87–5.11)
RDW: 14 % (ref 11.5–15.5)
WBC: 10.2 10*3/uL (ref 4.0–10.5)

## 2016-09-01 MED ORDER — MORPHINE SULFATE (PF) 4 MG/ML IV SOLN
4.0000 mg | Freq: Once | INTRAVENOUS | Status: AC
  Administered 2016-09-01: 4 mg via INTRAVENOUS
  Filled 2016-09-01: qty 1

## 2016-09-01 NOTE — Telephone Encounter (Addendum)
Patient walked in to office today with complaints of BP elevation. Per receptionist patient denied chest pains. Brought patient back to treatment room. Patient denies SOB, Chest pain(states she took Aspirin this am and the chest pain subsided) States she currently has tingling down left arm.   Bp= 134/80 P=72 O2= 95% T= 98.7  Per Mackie Pai, PA-C DOD patient will need to be seen in ED. ADvised patient who sates she has an appointment with her psychiatris at 11:00 am and would prefer not going to ED at this time. Advised if her arm pain continued and she developed SOB and Chest pain we strongly advise she go to ED. Asked patient if I could schedule follow up patient  As soon as possible with her provider Raiford Noble [place]) patient agreed. Took patient to front desk staff to schedule appointment.     I asked Santiago Glad to clarify pt symptoms. I was then  told she had some chest pain yesterday for 2 hours. Then again today again. In addition left arm tingling when she talked with pt. Pt has hx of htn, high cholesterol and 61 yo. In light of these  isk factorsadvised ED evaluation now to get labs/troponin/work up.

## 2016-09-01 NOTE — Telephone Encounter (Signed)
Patient needs to be contacted and urged to go to the ER ASAP.

## 2016-09-01 NOTE — ED Notes (Signed)
Patient transported to CT 

## 2016-09-01 NOTE — ED Notes (Signed)
Pt on monitor 

## 2016-09-01 NOTE — ED Triage Notes (Signed)
Pt reports episode yesterday from 12-5pm of chest pain and generalized "loss of motor function". States she took her aspirin and regular medications and Sx resolved around 5. Chest pain again today at 0800 3/10. Pain also in left arm 6/10. Took 81mg  aspirin at 0900. Pt reports her father and grandfather had MIs and brother died at age 59 of a heart attack.

## 2016-09-01 NOTE — ED Provider Notes (Signed)
[facility] DEPT [facility] Provider Note   CSN: [id number] Arrival date & time: 09/01/16  1056     History   Chief Complaint Chief Complaint  Patient presents with  . Chest Pain    HPI Jasmine [name] is a 61 y.o. female.  HPI Patient presents emergency department complains of chest discomfort and generalized weakness yesterday.  Husband reports that her left side seems slightly weaker than her right side yesterday.  This lasted for 5-6 hours and she eventually went upstairs and went to bed.  When she woke this morning she began having chest discomfort again.  Today she states her weakness is not as severe as it was yesterday but she is now having some mild right-sided weakness.  No fevers or chills.  Her chest discomfort is described as a tightness and ache.  She states is worse with palpation.  She did take aspirin this morning at 9:00.  She does have a family history of cardiac disease.  No other complaints.  No unilateral leg swelling.  No history DVT or pulmonary embolism.   Past Medical History:  Diagnosis Date  . Anxiety   . Asthma   . Cervicogenic headache 08/24/2016  . Chronic high back pain   . Double vision   . Fibromyalgia   . Heart murmur   . High cholesterol   . Hypertension   . Hypoglycemia   . Kidney stone   . Meniscus tear    Right  . Migraine 08/07/2015  . PTSD (post-traumatic stress disorder)   . Schizophrenia ([place])    treated by Dr. Casimiro Needle  . Seizures ([place])    last seizure 3 years ago, not on AED  . Stroke ([place]) 07/08/11   "I've had mini strokes before; left side of face  is more down than right "  . Syncope and collapse    One episode - 07/10/15    Patient Active Problem List   Diagnosis Date Noted  . Cervicogenic headache 08/24/2016  . Weight gain 07/19/2016  . Pain of toe of left foot 05/19/2016  . Viral URI 05/19/2016  . Left shoulder pain 05/11/2016  . Dry eye syndrome of both lacrimal glands 04/27/2016  . Blepharitis of upper and lower  eyelids of both eyes 04/27/2016  . Hyperlipidemia 02/01/2016  . Benign paroxysmal positional vertigo 01/05/2016  . Essential hypertension 01/05/2016  . Fibromyalgia 10/31/2015  . Migraine 08/07/2015  . Neck pain 08/07/2015  . Bilateral leg pain 04/26/2015  . Dysphagia 03/29/2015  . Myalgia and myositis 03/04/2015  . DJD (degenerative joint disease) of knee 12/09/2014  . Osteoporosis 12/23/2013    Past Surgical History:  Procedure Laterality Date  . COCHLEAR IMPLANT  2010   left  . [place]   left  . KIDNEY STONE SURGERY  ~ 2006  . TONSILLECTOMY     "as a child"  . WISDOM TOOTH EXTRACTION      OB History    No data available       Home Medications    Prior to Admission medications   Medication Sig Start Date End Date Taking? Authorizing Provider  ADVAIR [facility] 115-21 MCG/ACT inhaler INHALE ONE PUFF BY MOUTH TWICE DAILY 01/28/16   Brunetta Jeans, PA-C  albuterol (PROVENTIL HFA;VENTOLIN HFA) 108 (90 Base) MCG/ACT inhaler Inhale 2 puffs into the lungs every 6 (six) hours as needed for wheezing or shortness of breath. 05/06/16   Brunetta Jeans, PA-C  azelastine (ASTELIN) 0.1 % nasal spray  Place 1 spray into both nostrils 2 (two) times daily. Use in each nostril as directed 03/31/16   Brunetta Jeans, PA-C  fluticasone [facility]) 50 MCG/ACT nasal spray USE TWO SPRAY(S) IN EACH NOSTRIL ONCE DAILY 11/17/15   Brunetta Jeans, PA-C  gabapentin (NEURONTIN) 300 MG capsule One capsule in the morning and midday and 2 at night 08/24/16   Kathrynn Ducking, MD  hydrocortisone 2.5 % cream  07/13/16   [provider]  ibuprofen (ADVIL,MOTRIN) 600 MG tablet Take 1 tablet (600 mg total) by mouth every 8 (eight) hours as needed. 08/12/16   Brunetta Jeans, PA-C  ketoconazole (NIZORAL) 2 % cream  07/13/16   [provider]  losartan (COZAAR) 25 MG tablet Take 1 tablet (25 mg total) by mouth 2 (two) times daily. 08/12/16   Brunetta Jeans, PA-C  metoprolol  succinate (TOPROL-XL) 25 MG 24 hr tablet Take 3 tablets (75 mg total) by mouth daily. Take 3 tablets daily (75mg  ) 08/12/16   Brunetta Jeans, PA-C  Multiple Vitamin (THERA) TABS Take by mouth.    [provider]  omeprazole (PRILOSEC) 20 MG capsule Take 1 capsule (20 mg total) by mouth daily. 08/26/16   Brunetta Jeans, PA-C  orphenadrine (NORFLEX) 100 MG tablet Take 1 tablet (100 mg total) by mouth 2 (two) times daily as needed for muscle spasms. 06/28/16   Brunetta Jeans, PA-C  promethazine (PHENERGAN) 12.5 MG tablet Take 1 tablet (12.5 mg total) by mouth every 6 (six) hours as needed for nausea. 04/22/16   Brunetta Jeans, PA-C  rosuvastatin (CRESTOR) 20 MG tablet TAKE ONE TABLET BY MOUTH ONCE DAILY IN THE EVENING 08/12/16   Brunetta Jeans, PA-C  temazepam (RESTORIL) 15 MG capsule Take 2 capsules by mouth at bedtime. 08/15/16   [provider]  triamcinolone cream (KENALOG) 0.1 %  07/13/16   [provider]  zolpidem (AMBIEN) 10 MG tablet Take 2 tablets by mouth at bedtime.  04/06/16   [provider]    Family History Family History  Problem Relation Age of Onset  . Heart attack Father 89       Living  . Arthritis Father   . Skin cancer Father   . Hypertension Father   . Hyperlipidemia Father   . Leukemia Mother 59       Deceased  . Alzheimer's disease Paternal Aunt        X2  . Stomach cancer Paternal Aunt        x1  . Arthritis/Rheumatoid Paternal Aunt        x1  . Neuropathy Brother        Peripheal  . Fibromyalgia Sister   . HIV Sister   . Arthritis/Rheumatoid Sister   . Diabetes Paternal Grandmother     Social History Social History  Substance Use Topics  . Smoking status: Former Smoker    Packs/day: 1.00    Years: 20.00    Types: Cigarettes    Quit date: 02/29/2004  . Smokeless tobacco: Never Used  . Alcohol use 0.0 oz/week     Comment: "glass of wine q once in awhile; not very often; do it on special occasion"      Allergies   Sulfa antibiotics; Famciclovir; Metronidazole; Soma [carisoprodol]; Acyclovir and related; Benadryl [diphenhydramine hcl]; Cephalosporins; Codeine; Cyclobenzaprine; Diclofenac sodium; Linzess [linaclotide]; Nitrofurantoin monohyd macro; Prednisone; Tizanidine; Baclofen; Meloxicam; and Sulfamethoxazole   Review of Systems Review of Systems  All other systems reviewed  and are negative.    Physical Exam Updated Vital Signs BP 126/65   Pulse 74   Temp 98.4 F (36.9 C) (Oral)   Resp 12   Ht 5\' 4"  (1.626 m)   Wt 88 kg (194 lb)   SpO2 95%   BMI 33.30 kg/m   Physical Exam  Constitutional: She is oriented to person, place, and time. She appears well-developed and well-nourished. No distress.  HENT:  Head: Normocephalic and atraumatic.  Eyes: EOM are normal. Pupils are equal, round, and reactive to light.  Neck: Normal range of motion.  Cardiovascular: Normal rate, regular rhythm and normal heart sounds.   Pulmonary/Chest: Effort normal and breath sounds normal.  Abdominal: Soft. She exhibits no distension. There is no tenderness.  Musculoskeletal: Normal range of motion.  Neurological: She is alert and oriented to person, place, and time.  5/5 strength in major muscle groups of  bilateral upper and lower extremities. Speech normal. No facial asymetry.   Skin: Skin is warm and dry.  Psychiatric: She has a normal mood and affect. Judgment normal.  Nursing note and vitals reviewed.    ED Treatments / Results  Labs (all labs ordered are listed, but only abnormal results are displayed) Labs Reviewed  COMPREHENSIVE METABOLIC PANEL - Abnormal; Notable for the following:       Result Value   Glucose, Bld 149 (*)    All other components within normal limits  CBC  TROPONIN I  TROPONIN I    EKG  EKG Interpretation  Date/Time:  Thursday September 01 2016 11:07:44 EDT Ventricular Rate:  75 PR Interval:    QRS Duration: 87 QT Interval:  375 QTC  Calculation: 419 R Axis:   60 Text Interpretation:  Sinus rhythm Borderline T abnormalities, anterior leads No significant change was found Confirmed by Jola Schmidt [phone]) on 09/01/2016 11:25:14 AM       Radiology Dg Chest 2 View  Result Date: 09/01/2016 CLINICAL DATA:  Patient with chest pain and tightness for 2 days. EXAM: CHEST  2 VIEW COMPARISON:  Chest radiograph 03/31/2016. FINDINGS: Monitoring leads overlie the patient. Stable cardiac and mediastinal contours. No consolidative pulmonary opacities. No pleural effusion or pneumothorax. Thoracic spine degenerative changes. IMPRESSION: No acute cardiopulmonary process. Electronically Signed   By: Lovey Newcomer M.D.   On: 09/01/2016 11:56   Ct Head Wo Contrast  Result Date: 09/01/2016 CLINICAL DATA:  Cp since yesterday, ha, rt sided weakness, rt eye vision chgs. EXAM: CT HEAD WITHOUT CONTRAST TECHNIQUE: Contiguous axial images were obtained from the base of the skull through the vertex without intravenous contrast. COMPARISON:  Head CT 12/09/2014 FINDINGS: Brain: Significant streak artifact from the cochlear implant in the LEFT parietal bone. No acute intracranial hemorrhage. No focal mass lesion. No CT evidence of acute infarction. No midline shift or mass effect. No hydrocephalus. Basilar cisterns are patent. Vascular: No hyperdense vessel or unexpected calcification. Skull: Normal. Negative for fracture or focal lesion. LEFT cochlear implant. Sinuses/Orbits: Paranasal sinuses and mastoid air cells are clear. Orbits are clear. Other: None. IMPRESSION: No acute intracranial findings. Electronically Signed   By: Suzy Bouchard M.D.   On: 09/01/2016 12:29    Procedures Procedures (including critical care time)  Medications Ordered in ED Medications  morphine 4 MG/ML injection 4 mg (4 mg Intravenous Given 09/01/16 1230)     Initial Impression / Assessment and Plan / ED Course  I have reviewed the triage vital signs and the nursing  notes.  Pertinent labs &  imaging results that were available during my care of the patient were reviewed by me and considered in my medical decision making (see chart for details).     Overall the patient is well-appearing.  She has no focal weakness on examination this time.  Her head CT is normal.  She had abnormal movement bilaterally yesterday followed by some mild left-sided weakness and today has mild right-sided weakness.  This a very atypical presentation.  Low suspicion for cardiac disease I do not believe this is a presentation of a stroke.  Doubt myasthenia.  Patient is overall well-appearing.  My plan will be to discharge home, she can safely ambulate around the ER with close primary care follow-up.  Patient will follow-up with her neurologist Dr. [name] who also follows her for her fibromyalgia.  Final Clinical Impressions(s) / ED Diagnoses   Final diagnoses:  Precordial pain  Discoordination    New Prescriptions New Prescriptions   No medications on file     Jola Schmidt, MD 09/01/16 1512

## 2016-09-01 NOTE — Telephone Encounter (Signed)
Patient Name: Jasmine [name]  DOB: December 14, 1955    Initial Comment Caller states got very weak yesterday   Nurse Assessment  Nurse: Mallie Mussel, RN, Alveta Heimlich Date/Time (Eastern Time): 09/01/2016 9:25:47 AM  Confirm and document reason for call. If symptomatic, describe symptoms. ---Caller states that she felt very weak yesterday and had some chest pain. Denies feeling weak at present, but she still has some chest pain. She describes her pain as sharp and dull at the same time. Currently she rates her pain as 2 on 0-10 scale. She states that yesterday it was 7 yesterday. She denies difficulty breathing. The pain is not constant. She has "a little bit" of the pain at present. She took an ASA and it has helped.  Does the patient have any new or worsening symptoms? ---Yes  Will a triage be completed? ---Yes  Related visit to physician within the last 2 weeks? ---No  Does the PT have any chronic conditions? (i.e. diabetes, asthma, etc.) ---Yes  List chronic conditions. ---HTN, Hypercholesterolemia  Is this a behavioral health or substance abuse call? ---No     Guidelines    Guideline Title Affirmed Question Affirmed Notes  Chest Pain [1] Chest pain lasts > 5 minutes AND [2] age > 48    Final Disposition User   Call EMS 911 Now Mallie Mussel, RN, [name] does not want to call 911, she wants to see if she can see [name].  I called the backline twice. No answwer after 12 rings either time.   Disagree/Comply: Disagree  Disagree/Comply Reason: Disagree with instructions

## 2016-09-01 NOTE — ED Notes (Signed)
Patient transported to X-ray 

## 2016-09-01 NOTE — Telephone Encounter (Signed)
Patient has been instructed to go to ED - She is aware and stated verbal understanding. She states that her husband is taking her to ER at [facility].   Patient was scheduled to come see [place] tomorrow for this issue - patient is aware that this appointment has been canceled due to her going to ED, and we can schedule a follow-up as needed based off of that ER visit

## 2016-09-02 ENCOUNTER — Telehealth: Payer: Self-pay | Admitting: Physician Assistant

## 2016-09-02 ENCOUNTER — Ambulatory Visit: Payer: 59 | Admitting: Physician Assistant

## 2016-09-02 MED ORDER — ONDANSETRON HCL 4 MG PO TABS: 4.0000 mg | ORAL_TABLET | Freq: Three times a day (TID) | ORAL | 0 refills | Status: DC | PRN

## 2016-09-02 NOTE — Telephone Encounter (Signed)
Pt asking for a Rx to be called in to help with nausea and stomach cramps, pt states that she was seen yesterday in ER.

## 2016-09-02 NOTE — Telephone Encounter (Signed)
Have sent in a script for her to take. Make sure she schedules follow-up with Dr. [name] and a follow-up with me next week. Any worsening symptoms over the weekend, needs repeat assessment in ER.

## 2016-09-02 NOTE — Telephone Encounter (Signed)
Advised patient rx for Zofran was sent to the pharmacy for her nausea and stomach cramps. Advised to contact Dr [name] office to schedule an appointment. Advised to call next week for follow up appointment with [facility]. Recommend if sxs worsens to go to the ER. She is agreeable.

## 2016-09-05 ENCOUNTER — Encounter: Payer: Self-pay | Admitting: Interventional Cardiology

## 2016-09-05 ENCOUNTER — Ambulatory Visit (INDEPENDENT_AMBULATORY_CARE_PROVIDER_SITE_OTHER): Payer: 59 | Admitting: Interventional Cardiology

## 2016-09-05 ENCOUNTER — Encounter (INDEPENDENT_AMBULATORY_CARE_PROVIDER_SITE_OTHER): Payer: Self-pay

## 2016-09-05 VITALS — BP 126/80 | HR 78 | Ht 64.0 in | Wt 196.0 lb

## 2016-09-05 DIAGNOSIS — R531 Weakness: Secondary | ICD-10-CM

## 2016-09-05 DIAGNOSIS — E782 Mixed hyperlipidemia: Secondary | ICD-10-CM

## 2016-09-05 DIAGNOSIS — R079 Chest pain, unspecified: Secondary | ICD-10-CM

## 2016-09-05 NOTE — Progress Notes (Signed)
Cardiology Office Note   Date:  09/05/2016   ID:  Jasmine [name], DOB [birth year], MRN [id number]  PCP:  Brunetta Jeans, PA-C    No chief complaint on file. chest pain   Wt Readings from Last 3 Encounters:  09/05/16 196 lb (88.9 kg)  09/01/16 194 lb (88 kg)  08/26/16 198 lb (89.8 kg)       History of Present Illness: Jasmine [name] is a 61 y.o. female who is being seen today for the evaluation of chest pain at the request of Brunetta Jeans, PA-C.  She was seen in the ER on July 5 for weakness and chest pain.    It comes on as a sharp pain.  It then becomes dull.  No specific trigger noted.  Not related to exercise.  Sx can ocur several times a week.   She had a stress test a few years at [facility] center.  Records show that she had a nuclear stress test in 7/16 that was negative for ischemia and normal LV EF.       Past Medical History:  Diagnosis Date  . Anxiety   . Asthma   . Cervicogenic headache 08/24/2016  . Chronic high back pain   . Double vision   . Fibromyalgia   . Heart murmur   . High cholesterol   . Hypertension   . Hypoglycemia   . Kidney stone   . Meniscus tear    Right  . Migraine 08/07/2015  . PTSD (post-traumatic stress disorder)   . Schizophrenia ([place])    treated by Dr. Casimiro Needle  . Seizures ([place])    last seizure 3 years ago, not on AED  . Stroke ([place]) 07/08/11   "I've had mini strokes before; left side of face  is more down than right "  . Syncope and collapse    One episode - 07/10/15    Past Surgical History:  Procedure Laterality Date  . COCHLEAR IMPLANT  2010   left  . [place]   left  . KIDNEY STONE SURGERY  ~ 2006  . TONSILLECTOMY     "as a child"  . WISDOM TOOTH EXTRACTION       Current Outpatient Prescriptions  Medication Sig Dispense Refill  . ADVAIR HFA 115-21 MCG/ACT inhaler INHALE ONE PUFF BY MOUTH TWICE DAILY 12 g 5  . albuterol (PROVENTIL HFA;VENTOLIN HFA) 108 (90 Base) MCG/ACT  inhaler Inhale 2 puffs into the lungs every 6 (six) hours as needed for wheezing or shortness of breath. 1 Inhaler 2  . azelastine (ASTELIN) 0.1 % nasal spray Place 1 spray into both nostrils 2 (two) times daily. Use in each nostril as directed 30 mL 12  . fluticasone (FLONASE) 50 MCG/ACT nasal spray USE TWO SPRAY(S) IN EACH NOSTRIL ONCE DAILY 16 g 5  . gabapentin (NEURONTIN) 300 MG capsule One capsule in the morning and midday and 2 at night 120 capsule 3  . hydrocortisone 2.5 % cream Apply 1 application topically daily as needed.     Marland Kitchen losartan (COZAAR) 25 MG tablet Take 1 tablet (25 mg total) by mouth 2 (two) times daily. 60 tablet 5  . metoprolol succinate (TOPROL-XL) 25 MG 24 hr tablet Take 3 tablets (75 mg total) by mouth daily. Take 3 tablets daily (75mg  ) 90 tablet 5  . Multiple Vitamin (THERA) TABS Take by mouth.    Marland Kitchen omeprazole (PRILOSEC) 20 MG capsule Take 1 capsule (20 mg  total) by mouth daily. 30 capsule 3  . ondansetron (ZOFRAN) 4 MG tablet Take 1 tablet (4 mg total) by mouth every 8 (eight) hours as needed for nausea or vomiting. 20 tablet 0  . orphenadrine (NORFLEX) 100 MG tablet Take 1 tablet (100 mg total) by mouth 2 (two) times daily as needed for muscle spasms. 60 tablet 0  . promethazine (PHENERGAN) 12.5 MG tablet Take 1 tablet (12.5 mg total) by mouth every 6 (six) hours as needed for nausea. 20 tablet 0  . rosuvastatin (CRESTOR) 20 MG tablet TAKE ONE TABLET BY MOUTH ONCE DAILY IN THE EVENING 90 tablet 1  . temazepam (RESTORIL) 15 MG capsule Take 2 capsules by mouth at bedtime.    Marland Kitchen zolpidem (AMBIEN) 10 MG tablet Take 2 tablets by mouth at bedtime.     Marland Kitchen ibuprofen (ADVIL,MOTRIN) 600 MG tablet Take 1 tablet (600 mg total) by mouth every 8 (eight) hours as needed. 30 tablet 0   No current facility-administered medications for this visit.     Allergies:   Sulfa antibiotics; Famciclovir; Metronidazole; Soma [carisoprodol]; Acyclovir and related; Benadryl [diphenhydramine hcl];  Cephalosporins; Codeine; Cyclobenzaprine; Diclofenac sodium; Linzess [linaclotide]; Nitrofurantoin monohyd macro; Prednisone; Tizanidine; Baclofen; Meloxicam; and Sulfamethoxazole    Social History:  The patient  reports that she quit smoking about 12 years ago. Her smoking use included Cigarettes. She has a 20.00 pack-year smoking history. She has never used smokeless tobacco. She reports that she drinks alcohol. She reports that she does not use drugs.   Family History:  The patient's family history includes Alzheimer's disease in her paternal aunt; Arthritis in her father; Arthritis/Rheumatoid in her paternal aunt and sister; Diabetes in her paternal grandmother; Fibromyalgia in her sister; HIV in her sister; Heart attack (age of onset: 73) in her father; Hyperlipidemia in her father; Hypertension in her father; Leukemia (age of onset: 60) in her mother; Neuropathy in her brother; Skin cancer in her father; Stomach cancer in her paternal aunt.    ROS:  Please see the history of present illness.   Otherwise, review of systems are positive for weakness, chest pain.   All other systems are reviewed and negative.    PHYSICAL EXAM: VS:  BP 126/80   Pulse 78   Ht 5\' 4"  (1.626 m)   Wt 196 lb (88.9 kg)   SpO2 97%   BMI 33.64 kg/m  , BMI Body mass index is 33.64 kg/m. GEN: Well nourished, well developed, in no acute distress  HEENT: normal  Neck: no JVD, carotid bruits, or masses Cardiac: RRR; no murmurs, rubs, or gallops,no edema  Respiratory:  clear to auscultation bilaterally, normal work of breathing GI: soft, nontender, nondistended, + BS MS: no deformity or atrophy  Skin: warm and dry, no rash Neuro:  Strength and sensation are intact; slightly abnormal speech Psych: euthymic mood, full affect   EKG:   The ekg ordered on 09/01/16 demonstrates NSR, no ST changes   Recent Labs: 06/14/2016: TSH 2.76 09/01/2016: ALT 18; BUN 15; Creatinine, Ser 0.61; Hemoglobin 13.5; Platelets 257;  Potassium 3.9; Sodium 138   Lipid Panel    Component Value Date/Time   CHOL 137 02/01/2016 1045   TRIG 88.0 02/01/2016 1045   HDL 53.40 02/01/2016 1045   CHOLHDL 3 02/01/2016 1045   VLDL 17.6 02/01/2016 1045   LDLCALC 66 02/01/2016 1045     Other studies Reviewed: Additional studies/ records that were reviewed today with results demonstrating: Normal LV function and normal valvular function in  2009 by echo.2018 head CT negative.   ASSESSMENT AND PLAN:  1. Chest pain: Atypical symptoms.  Negative stress test in 2016.  Normal w/u in the ER.  Negative troponin and normal ECG.  She gets relief of her sx with deep breathing.  Will plan for echo to eval for structural heart disease.   2. Left sided weakness.  SHe is concerned about carotid disease.  Will check carotid DOppler.   3. Hyperlipidemia:  LDL controlled in 12/17. COntinue Crestor.  4. I saw this patient many years ago at [facility] cardiology. Feview of my notes from that time showed, she had  chest pain and required reassurance for her chest pain. She had a negative Cardiolite back in 2008. In 2006, she had a normal echocardiogram.    Current medicines are reviewed at length with the patient today.  The patient concerns regarding her medicines were addressed.  The following changes have been made:  No change  Labs/ tests ordered today include:  No orders of the defined types were placed in this encounter.   Recommend 150 minutes/week of aerobic exercise Low fat, low carb, high fiber diet recommended  Disposition:   FU based on test results   Signed, Larae Grooms, MD  09/05/2016 2:32 PM    [facility] [address], [place], [place]  [address] Phone: [phone]; Fax: [phone]

## 2016-09-05 NOTE — Patient Instructions (Signed)
Medication Instructions:  Your physician recommends that you continue on your current medications as directed. Please refer to the Current Medication list given to you today.   Labwork: None ordered  Testing/Procedures: Your physician has requested that you have a carotid duplex. This test is an ultrasound of the carotid arteries in your neck. It looks at blood flow through these arteries that supply the brain with blood. Allow one hour for this exam. There are no restrictions or special instructions.  Your physician has requested that you have an echocardiogram. Echocardiography is a painless test that uses sound waves to create images of your heart. It provides your doctor with information about the size and shape of your heart and how well your heart's chambers and valves are working. This procedure takes approximately one hour. There are no restrictions for this procedure.    Follow-Up: Based on test results  Any Other Special Instructions Will Be Listed Below (If Applicable).     If you need a refill on your cardiac medications before your next appointment, please call your pharmacy.

## 2016-09-06 ENCOUNTER — Telehealth: Payer: Self-pay | Admitting: Interventional Cardiology

## 2016-09-06 ENCOUNTER — Encounter: Payer: Self-pay | Admitting: Physician Assistant

## 2016-09-06 ENCOUNTER — Ambulatory Visit (INDEPENDENT_AMBULATORY_CARE_PROVIDER_SITE_OTHER): Payer: 59 | Admitting: Physician Assistant

## 2016-09-06 VITALS — BP 124/82 | HR 73 | Temp 97.8°F | Resp 16 | Ht 64.0 in | Wt 194.0 lb

## 2016-09-06 DIAGNOSIS — J019 Acute sinusitis, unspecified: Secondary | ICD-10-CM | POA: Diagnosis not present

## 2016-09-06 DIAGNOSIS — R062 Wheezing: Secondary | ICD-10-CM | POA: Diagnosis not present

## 2016-09-06 DIAGNOSIS — B9689 Other specified bacterial agents as the cause of diseases classified elsewhere: Secondary | ICD-10-CM

## 2016-09-06 MED ORDER — AMOXICILLIN-POT CLAVULANATE 875-125 MG PO TABS: 1.0000 | ORAL_TABLET | Freq: Two times a day (BID) | ORAL | 0 refills | Status: DC

## 2016-09-06 MED ORDER — FLUTICASONE-SALMETEROL 115-21 MCG/ACT IN AERO: 1.0000 | INHALATION_SPRAY | Freq: Two times a day (BID) | RESPIRATORY_TRACT | 5 refills | Status: DC

## 2016-09-06 NOTE — Progress Notes (Signed)
Patient presents to clinic today c/o 3-4 days of worsening nasal congestion with sinus pressure and sinus tenderness associated with chest tightness and some SOB with exertion. Notes chest tightness and wheezing improved with albuterol, but only temporarily. Denies fever, chills, recent travel or sick contact. Is scheduling for carotid US and echo tomorrow for assessment of chronic SOBOE scheduled by Cardiology who evaluated patient yesterday.    Past Medical History:  Diagnosis Date  . Anxiety   . Asthma   . Cervicogenic headache 08/24/2016  . Chronic high back pain   . Double vision   . Fibromyalgia   . Heart murmur   . High cholesterol   . Hypertension   . Hypoglycemia   . Kidney stone   . Meniscus tear    Right  . Migraine 08/07/2015  . PTSD (post-traumatic stress disorder)   . Schizophrenia ([place])    treated by Dr. Casimiro Needle  . Seizures ([place])    last seizure 3 years ago, not on AED  . Stroke ([place]) 07/08/11   "I've had mini strokes before; left side of face  is more down than right "  . Syncope and collapse    One episode - 07/10/15    Current Outpatient Prescriptions on File Prior to Visit  Medication Sig Dispense Refill  . albuterol (PROVENTIL HFA;VENTOLIN HFA) 108 (90 Base) MCG/ACT inhaler Inhale 2 puffs into the lungs every 6 (six) hours as needed for wheezing or shortness of breath. 1 Inhaler 2  . azelastine (ASTELIN) 0.1 % nasal spray Place 1 spray into both nostrils 2 (two) times daily. Use in each nostril as directed 30 mL 12  . fluticasone (FLONASE) 50 MCG/ACT nasal spray USE TWO SPRAY(S) IN EACH NOSTRIL ONCE DAILY 16 g 5  . gabapentin (NEURONTIN) 300 MG capsule One capsule in the morning and midday and 2 at night 120 capsule 3  . hydrocortisone 2.5 % cream Apply 1 application topically daily as needed.     Marland Kitchen ibuprofen (ADVIL,MOTRIN) 600 MG tablet Take 1 tablet (600 mg total) by mouth every 8 (eight) hours as needed. 30 tablet 0  . losartan (COZAAR) 25 MG tablet Take 1  tablet (25 mg total) by mouth 2 (two) times daily. 60 tablet 5  . metoprolol succinate (TOPROL-XL) 25 MG 24 hr tablet Take 3 tablets (75 mg total) by mouth daily. Take 3 tablets daily (62m ) 90 tablet 5  . Multiple Vitamin (THERA) TABS Take by mouth.    .Marland Kitchenomeprazole (PRILOSEC) 20 MG capsule Take 1 capsule (20 mg total) by mouth daily. 30 capsule 3  . ondansetron (ZOFRAN) 4 MG tablet Take 1 tablet (4 mg total) by mouth every 8 (eight) hours as needed for nausea or vomiting. 20 tablet 0  . orphenadrine (NORFLEX) 100 MG tablet Take 1 tablet (100 mg total) by mouth 2 (two) times daily as needed for muscle spasms. 60 tablet 0  . promethazine (PHENERGAN) 12.5 MG tablet Take 1 tablet (12.5 mg total) by mouth every 6 (six) hours as needed for nausea. 20 tablet 0  . rosuvastatin (CRESTOR) 20 MG tablet TAKE ONE TABLET BY MOUTH ONCE DAILY IN THE EVENING 90 tablet 1  . temazepam (RESTORIL) 15 MG capsule Take 2 capsules by mouth at bedtime.    .Marland Kitchenzolpidem (AMBIEN) 10 MG tablet Take 2 tablets by mouth at bedtime.      No current facility-administered medications on file prior to visit.     Allergies  Allergen Reactions  . Sulfa  Antibiotics Hives  . Famciclovir Other (See Comments)  . Metronidazole Nausea And Vomiting  . Soma [Carisoprodol] Other (See Comments)    Moody and sleepy Cognitive Function, Daytime sleepiness  . Acyclovir And Related   . Benadryl [Diphenhydramine Hcl]     Severe emotional reaction and doesn't work well with Pt. Past history  . Cephalosporins Hives  . Codeine Other (See Comments)    Makes patient feel odd ; "sometimes mild; sometimes severe reaction; mostly severe"  . Cyclobenzaprine Other (See Comments)    Muscle Aches.  . Diclofenac Sodium Nausea And Vomiting  . Linzess [Linaclotide] Diarrhea  . Nitrofurantoin Monohyd Macro     Felt funny  . Prednisone Other (See Comments)    migraine  . Tizanidine     Other reaction(s): Other (See Comments) insomnia  . Baclofen  Nausea Only  . Meloxicam     Other reaction(s): Confusion  . Sulfamethoxazole Rash    Family History  Problem Relation Age of Onset  . Heart attack Father 27       Living  . Arthritis Father   . Skin cancer Father   . Hypertension Father   . Hyperlipidemia Father   . Leukemia Mother 18       Deceased  . Alzheimer's disease Paternal Aunt        X2  . Stomach cancer Paternal Aunt        x1  . Arthritis/Rheumatoid Paternal Aunt        x1  . Neuropathy Brother        Peripheal  . Fibromyalgia Sister   . HIV Sister   . Arthritis/Rheumatoid Sister   . Diabetes Paternal Grandmother     Social History   Social History  . Marital status: Married    Spouse name: N/A  . Number of children: 3  . Years of education: 2 yrs coll   Occupational History  . Disabled    Social History Main Topics  . Smoking status: Former Smoker    Packs/day: 1.00    Years: 20.00    Types: Cigarettes    Quit date: 02/29/2004  . Smokeless tobacco: Never Used  . Alcohol use 0.0 oz/week     Comment: "glass of wine q once in awhile; not very often; do it on special occasion"  . Drug use: No  . Sexual activity: No   Other Topics Concern  . None   Social History Narrative   Completed 2 years of college.    On disability.   Lives at home with her husband.   No caffeine use.   Left-handed.   Three children, 2 biological - 1 adopted.   Review of Systems - See HPI.  All other ROS are negative.  BP 124/82   Pulse 73   Temp 97.8 F (36.6 C) (Oral)   Resp 16   Ht 5' 4"  (1.626 m)   Wt 194 lb (88 kg)   SpO2 99%   BMI 33.30 kg/m   Physical Exam  Constitutional: She is oriented to person, place, and time and well-developed, well-nourished, and in no distress.  HENT:  Head: Normocephalic and atraumatic.  Right Ear: External ear normal. A middle ear effusion is present.  Left Ear: External ear normal. A middle ear effusion is present.  Nose: Mucosal edema and rhinorrhea present. Left sinus  exhibits maxillary sinus tenderness and frontal sinus tenderness.  Mouth/Throat: Uvula is midline, oropharynx is clear and moist and mucous membranes are normal.  Eyes:  Conjunctivae are normal.  Neck: Neck supple.  Cardiovascular: Normal rate, regular rhythm, normal heart sounds and intact distal pulses.   Pulmonary/Chest: No respiratory distress. She has wheezes. She has no rales. She exhibits no tenderness.  Neurological: She is alert and oriented to person, place, and time.  Skin: Skin is warm and dry. No rash noted.  Psychiatric: Affect normal.  Vitals reviewed.   Recent Results (from the past 2160 hour(s))  TSH     Status: None   Collection Time: 06/14/16  9:31 AM  Result Value Ref Range   TSH 2.76 0.35 - 4.50 uIU/mL  T4, free     Status: None   Collection Time: 06/14/16  9:31 AM  Result Value Ref Range   Free T4 0.88 0.60 - 1.60 ng/dL    Comment: Specimens from patients who are undergoing biotin therapy and /or ingesting biotin supplements may contain high levels of biotin.  The higher biotin concentration in these specimens interferes with this Free T4 assay.  Specimens that contain high levels  of biotin may cause false high results for this Free T4 assay.  Please interpret results in light of the total clinical presentation of the patient.    Vitamin D 1,25 dihydroxy     Status: None   Collection Time: 06/14/16  9:31 AM  Result Value Ref Range   Vitamin D 1, 25 (OH)2 Total 34 18 - 72 pg/mL   Vitamin D3 1, 25 (OH)2 34 pg/mL   Vitamin D2 1, 25 (OH)2 <8 pg/mL    Comment: Vitamin D3, 1,25(OH)2 indicates both endogenous production and supplementation.  Vitamin D2, 1,25(OH)2 is an indicator of exogeous sources, such as diet or supplementation.  Interpretation and therapy are based on measurement of Vitamin D,1,25(OH)2, Total. This test was developed and its analytical performance characteristics have been determined by [facility], [place], [place]. It  has not been cleared or approved by the FDA. This assay has been validated pursuant to the CLIA regulations and is used for clinical purposes.   CBC     Status: None   Collection Time: 09/01/16 11:24 AM  Result Value Ref Range   WBC 10.2 4.0 - 10.5 K/uL   RBC 4.72 3.87 - 5.11 MIL/uL   Hemoglobin 13.5 12.0 - 15.0 g/dL   HCT 40.3 36.0 - 46.0 %   MCV 85.4 78.0 - 100.0 fL   MCH 28.6 26.0 - 34.0 pg   MCHC 33.5 30.0 - 36.0 g/dL   RDW 14.0 11.5 - 15.5 %   Platelets 257 150 - 400 K/uL  Comprehensive metabolic panel     Status: Abnormal   Collection Time: 09/01/16 11:24 AM  Result Value Ref Range   Sodium 138 135 - 145 mmol/L   Potassium 3.9 3.5 - 5.1 mmol/L   Chloride 104 101 - 111 mmol/L   CO2 26 22 - 32 mmol/L   Glucose, Bld 149 (H) 65 - 99 mg/dL   BUN 15 6 - 20 mg/dL   Creatinine, Ser 0.61 0.44 - 1.00 mg/dL   Calcium 9.0 8.9 - 10.3 mg/dL   Total Protein 7.4 6.5 - 8.1 g/dL   Albumin 4.0 3.5 - 5.0 g/dL   AST 18 15 - 41 U/L   ALT 18 14 - 54 U/L   Alkaline Phosphatase 68 38 - 126 U/L   Total Bilirubin 0.8 0.3 - 1.2 mg/dL   GFR calc non Af Amer >60 >60 mL/min   GFR calc Af Amer >60 >60 mL/min  Comment: (NOTE) The eGFR has been calculated using the CKD EPI equation. This calculation has not been validated in all clinical situations. eGFR's persistently <60 mL/min signify possible Chronic Kidney Disease.    Anion gap 8 5 - 15  Troponin I     Status: None   Collection Time: 09/01/16 11:24 AM  Result Value Ref Range   Troponin I <0.03 <0.03 ng/mL  Troponin I     Status: None   Collection Time: 09/01/16  2:19 PM  Result Value Ref Range   Troponin I <0.03 <0.03 ng/mL    Assessment/Plan: 1. Acute bacterial sinusitis Rx Augmentin.  Increase fluids.  Rest.  Saline nasal spray.  Probiotic. Humidifier in bedroom. OTC medications reviewed.  Call or return to clinic if symptoms are not improving.   2. Wheezing Improved with Albuterol neb. Restart Advair. Continue Albuterol as  directed. Pulmonology referral placed for PFTs.    Leeanne Rio, PA-C

## 2016-09-06 NOTE — Telephone Encounter (Signed)
New Message ° ° pt verbalized that she is returning call for rn °

## 2016-09-06 NOTE — Telephone Encounter (Signed)
New message    Pt is calling asking for a call back. She would like to talk about some testing she is having tomorrow.

## 2016-09-06 NOTE — Telephone Encounter (Signed)
Left message for patient to call back  

## 2016-09-06 NOTE — Patient Instructions (Signed)
Please start the antibiotic as directed. Stay well hydrated and get plenty of rest.  Restart the Advair taking twice daily as directed. Albuterol as directed if needed for breakthrough symptoms.  Get saline nasal rinse to flush out nasal passages. Run a humidifier in the bedroom.  I am sending you to Pulmonology for further assessment of chronic symptoms with pulmonary function testing, etc.  If anything worsens, please go to the ER.

## 2016-09-06 NOTE — Telephone Encounter (Signed)
Patient calling and states that she was having breathing problems yesterday and that she thinks that it is bronchitis and wanted to know if she still needs to have the carotid ultrasound done tomorrow since she thinks it is bronchitis. I advised the patient that the carotid U/S was ordered because she was having left sided weakness and that she should keep her appointment tomorrow. Patient verbalized understanding and was in agreement with this plan.

## 2016-09-06 NOTE — Progress Notes (Signed)
Pre visit review using our clinic review tool, if applicable. No additional management support is needed unless otherwise documented below in the visit note. 

## 2016-09-07 ENCOUNTER — Ambulatory Visit (HOSPITAL_COMMUNITY)
Admission: RE | Admit: 2016-09-07 | Discharge: 2016-09-07 | Disposition: A | Discharge status: Home / Self Care | Payer: 59 | Source: Ambulatory Visit | Attending: Cardiology | Admitting: Cardiology

## 2016-09-07 DIAGNOSIS — I6523 Occlusion and stenosis of bilateral carotid arteries: Secondary | ICD-10-CM | POA: Diagnosis not present

## 2016-09-07 DIAGNOSIS — R531 Weakness: Secondary | ICD-10-CM | POA: Insufficient documentation

## 2016-09-09 DIAGNOSIS — M25561 Pain in right knee: Secondary | ICD-10-CM | POA: Diagnosis not present

## 2016-09-09 DIAGNOSIS — M25512 Pain in left shoulder: Secondary | ICD-10-CM | POA: Diagnosis not present

## 2016-09-09 DIAGNOSIS — M179 Osteoarthritis of knee, unspecified: Secondary | ICD-10-CM | POA: Diagnosis not present

## 2016-09-12 ENCOUNTER — Telehealth: Payer: Self-pay | Admitting: Emergency Medicine

## 2016-09-12 DIAGNOSIS — M25561 Pain in right knee: Secondary | ICD-10-CM | POA: Diagnosis not present

## 2016-09-12 DIAGNOSIS — M25512 Pain in left shoulder: Secondary | ICD-10-CM | POA: Diagnosis not present

## 2016-09-12 DIAGNOSIS — M179 Osteoarthritis of knee, unspecified: Secondary | ICD-10-CM | POA: Diagnosis not present

## 2016-09-12 NOTE — Telephone Encounter (Signed)
Patient called wanting the No show charge taken off for the 07/28/16. Patient states she did call to advise of unable to come to visit. Please advise

## 2016-09-12 NOTE — Telephone Encounter (Signed)
That’s fine with me

## 2016-09-13 ENCOUNTER — Ambulatory Visit: Payer: 59 | Attending: Neurology | Admitting: Physical Therapy

## 2016-09-13 ENCOUNTER — Ambulatory Visit (HOSPITAL_COMMUNITY): Payer: 59 | Attending: Cardiology

## 2016-09-13 ENCOUNTER — Other Ambulatory Visit: Payer: Self-pay

## 2016-09-13 DIAGNOSIS — I5030 Unspecified diastolic (congestive) heart failure: Secondary | ICD-10-CM | POA: Diagnosis not present

## 2016-09-13 DIAGNOSIS — R079 Chest pain, unspecified: Secondary | ICD-10-CM

## 2016-09-13 DIAGNOSIS — I071 Rheumatic tricuspid insufficiency: Secondary | ICD-10-CM | POA: Diagnosis not present

## 2016-09-13 DIAGNOSIS — R293 Abnormal posture: Secondary | ICD-10-CM | POA: Diagnosis not present

## 2016-09-13 DIAGNOSIS — R0789 Other chest pain: Secondary | ICD-10-CM | POA: Diagnosis not present

## 2016-09-13 DIAGNOSIS — M25612 Stiffness of left shoulder, not elsewhere classified: Secondary | ICD-10-CM | POA: Diagnosis not present

## 2016-09-13 NOTE — Telephone Encounter (Signed)
Please see below and remove NS charge. Please let patient know this has been done. Thank you.

## 2016-09-14 DIAGNOSIS — M25512 Pain in left shoulder: Secondary | ICD-10-CM | POA: Diagnosis not present

## 2016-09-14 DIAGNOSIS — M25561 Pain in right knee: Secondary | ICD-10-CM | POA: Diagnosis not present

## 2016-09-14 DIAGNOSIS — M9906 Segmental and somatic dysfunction of lower extremity: Secondary | ICD-10-CM | POA: Diagnosis not present

## 2016-09-14 DIAGNOSIS — M179 Osteoarthritis of knee, unspecified: Secondary | ICD-10-CM | POA: Diagnosis not present

## 2016-09-15 ENCOUNTER — Telehealth: Payer: Self-pay | Admitting: Physician Assistant

## 2016-09-15 NOTE — Telephone Encounter (Signed)
Pt LMOVM asking for a call back from [place], did not say what it was about.

## 2016-09-15 NOTE — Telephone Encounter (Signed)
Would recommend acupuncture. I am not sue if any of our physical therapists do this. She may have to find an independent provider, which she would have to set up herself.

## 2016-09-15 NOTE — Telephone Encounter (Signed)
Patient states insurance is not covering the Physical therapy anymore. She wanted to do accupuncture or something to help with pain.  Please advise

## 2016-09-15 NOTE — Telephone Encounter (Signed)
There is not a NS charge on Jasmine [name] account for 07/28/16, or any other DOS.

## 2016-09-16 NOTE — Telephone Encounter (Signed)
Advised patient recommendation of acupuncture. She was out of town and will call back for recommendations. Patient can try [facility] [phone].

## 2016-09-23 ENCOUNTER — Telehealth: Payer: Self-pay | Admitting: Physician Assistant

## 2016-09-23 DIAGNOSIS — M5136 Other intervertebral disc degeneration, lumbar region: Secondary | ICD-10-CM | POA: Diagnosis not present

## 2016-09-23 DIAGNOSIS — G8929 Other chronic pain: Secondary | ICD-10-CM | POA: Diagnosis not present

## 2016-09-23 DIAGNOSIS — M5441 Lumbago with sciatica, right side: Secondary | ICD-10-CM | POA: Diagnosis not present

## 2016-09-23 NOTE — Telephone Encounter (Signed)
Patient called office and asked that Cody's CMA call her back.  I asked the patient what this was in regards to, she declined to tell me.  She stated she just needs Patina to call her back.  I informed patient, due to Turner seeing patients in the office, her call may not be returned until after lunch or possible at the end of the day.  Patient said this was fine.

## 2016-09-23 NOTE — Telephone Encounter (Signed)
Patient wanted to increase her Gabapentin dosage for pain control. She is still having stabbing back pain and sciatica.  She states she saw Ortho and wanted to increase her Gabapentin dosage. Please advise

## 2016-09-23 NOTE — Telephone Encounter (Signed)
Notes from Dr. [name]' last note on patient:  "The patient is on a relatively low dose of gabapentin. We will increase the dose to 300 mg 3 times daily for one week and then go to 300 mg twice during the day and 600 mg in the evening. The patient will be set up for physical therapy to work on the neck and shoulders, if she is not improving with these therapies, we will consider a CT scan of the cervical spine. She will follow-up in about 4 months"  Is she currently taking 300 mg BID and then 600 mg QHS? If not, she needs to up medication as directed by Dr. [name].  Refills should come from him as he is the one managing this medication at present.

## 2016-09-26 ENCOUNTER — Telehealth: Payer: Self-pay | Admitting: Neurology

## 2016-09-26 MED ORDER — GABAPENTIN 600 MG PO TABS: 600.0000 mg | ORAL_TABLET | Freq: Three times a day (TID) | ORAL | 3 refills | Status: DC

## 2016-09-26 NOTE — Telephone Encounter (Signed)
Patient contacted Dr [name] office for increase of Gabapentin for pain control.

## 2016-09-26 NOTE — Telephone Encounter (Signed)
I called patient. The patient is having ongoing back and neck discomfort. We can go up on the gabapentin, she will go to 6 mg in the morning and evening, 3 her milligrams midday for 7-10 days, then I will convert her to the 600 mg tablets 3 times daily.

## 2016-09-26 NOTE — Telephone Encounter (Signed)
Pt called the office said the back has increased for about 1 week. She is wanting to increase gabapentin. Please call

## 2016-09-26 NOTE — Telephone Encounter (Signed)
LMOVM advising patient to return call about Gabapentin dosage. Which is prescribed by Dr [name].

## 2016-09-30 DIAGNOSIS — M791 Myalgia: Secondary | ICD-10-CM | POA: Diagnosis not present

## 2016-10-05 ENCOUNTER — Telehealth: Payer: Self-pay | Admitting: Emergency Medicine

## 2016-10-05 NOTE — Telephone Encounter (Signed)
Patient calling today concerned about her weight. She states she has been very active (cleaning her house and painting), has not changed diet-eating 3 meals, drinking water(unsure of how much) and Gatorade.

## 2016-10-12 ENCOUNTER — Telehealth: Payer: Self-pay | Admitting: Physician Assistant

## 2016-10-12 NOTE — Telephone Encounter (Signed)
Please call patient back to assess her concerns. Again if she continues to call the office and not give any information to staff or nursing but requesting a call back from me, this may not be accommodated. We need a reason for the call.

## 2016-10-12 NOTE — Telephone Encounter (Signed)
Pt calling asking for a call back from [place] regarding a personal matter and would not say anything else.

## 2016-10-13 NOTE — Telephone Encounter (Signed)
Called patient when she answered the phone. She states "I want to speak to Dr [name]" and hung up the phone.  Still don't know what the personal reason for the phone call.

## 2016-10-14 NOTE — Telephone Encounter (Signed)
Attempted to reach patient personally to discuss concerns and also to discuss need to leave some details with staff concerning calls, etc. Call went straight to VM. Left message for her to call back and speak with CMA since I will be in with patients. If behavior continues, will be very hard to maintain a therapeutic relationship.

## 2016-10-14 NOTE — Telephone Encounter (Signed)
Patient has appt scheduled for Monday.

## 2016-10-17 ENCOUNTER — Ambulatory Visit (INDEPENDENT_AMBULATORY_CARE_PROVIDER_SITE_OTHER): Payer: 59 | Admitting: Physician Assistant

## 2016-10-17 ENCOUNTER — Encounter: Payer: Self-pay | Admitting: Physician Assistant

## 2016-10-17 VITALS — BP 150/80 | HR 78 | Temp 98.3°F | Resp 14 | Ht 64.0 in | Wt 191.0 lb

## 2016-10-17 DIAGNOSIS — S83206A Unspecified tear of unspecified meniscus, current injury, right knee, initial encounter: Secondary | ICD-10-CM | POA: Diagnosis not present

## 2016-10-17 NOTE — Patient Instructions (Signed)
We have helped you set up your mychart account today.  This way you can send message to myself and Patina and do not have to worry about calling in to the office.  I am working on getting an MRI set up for your R knee. Continue chronic medications as directed. Wear your knee brace in the mean time.  Please follow-up with specialists as directed.  I will be getting records for review so we can determine proper treatment.

## 2016-10-17 NOTE — Progress Notes (Signed)
Pre visit review using our clinic review tool, if applicable. No additional management support is needed unless otherwise documented below in the visit note. 

## 2016-10-17 NOTE — Progress Notes (Signed)
Patient presents to clinic today to discuss chronic pain of R knee. Patient with long-standing history of R knee pain, including history of osteoarthritis and meniscal tear. States she was previously evaluated by Dr. Tonita Cong (Ortho) and was told of her tear. She wanted to avoid surgery and let tear heal on its own. Has been still dealing with ongoing pain that has worsened over time. Has been seeing a chiropractor who has been performing adjustments and laser treatments over the past few months without much success. Is wanting to know if there is something worsening in the knee. Is taking medications as directed with some relief in pain. Denies redness of significant swelling of joint. Notes occasional buckling of the knee. Would like repeat imaging.   Past Medical History:  Diagnosis Date  . Anxiety   . Asthma   . Cervicogenic headache 08/24/2016  . Chronic high back pain   . Double vision   . Fibromyalgia   . Heart murmur   . High cholesterol   . Hypertension   . Hypoglycemia   . Kidney stone   . Meniscus tear    Right  . Migraine 08/07/2015  . PTSD (post-traumatic stress disorder)   . Schizophrenia ([place])    treated by Dr. Casimiro Needle  . Seizures ([place])    last seizure 3 years ago, not on AED  . Stroke ([place]) 07/08/11   "I've had mini strokes before; left side of face  is more down than right "  . Syncope and collapse    One episode - 07/10/15    Current Outpatient Prescriptions on File Prior to Visit  Medication Sig Dispense Refill  . albuterol (PROVENTIL HFA;VENTOLIN HFA) 108 (90 Base) MCG/ACT inhaler Inhale 2 puffs into the lungs every 6 (six) hours as needed for wheezing or shortness of breath. 1 Inhaler 2  . azelastine (ASTELIN) 0.1 % nasal spray Place 1 spray into both nostrils 2 (two) times daily. Use in each nostril as directed 30 mL 12  . fluticasone (FLONASE) 50 MCG/ACT nasal spray USE TWO SPRAY(S) IN EACH NOSTRIL ONCE DAILY 16 g 5  . fluticasone-salmeterol (ADVAIR HFA) 115-21  MCG/ACT inhaler Inhale 1 puff into the lungs 2 (two) times daily. 12 g 5  . gabapentin (NEURONTIN) 600 MG tablet Take 1 tablet (600 mg total) by mouth 3 (three) times daily. 90 tablet 3  . ibuprofen (ADVIL,MOTRIN) 600 MG tablet Take 1 tablet (600 mg total) by mouth every 8 (eight) hours as needed. 30 tablet 0  . losartan (COZAAR) 25 MG tablet Take 1 tablet (25 mg total) by mouth 2 (two) times daily. 60 tablet 5  . metoprolol succinate (TOPROL-XL) 25 MG 24 hr tablet Take 3 tablets (75 mg total) by mouth daily. Take 3 tablets daily (30m ) 90 tablet 5  . Multiple Vitamin (THERA) TABS Take by mouth.    .Marland Kitchenomeprazole (PRILOSEC) 20 MG capsule Take 1 capsule (20 mg total) by mouth daily. 30 capsule 3  . ondansetron (ZOFRAN) 4 MG tablet Take 1 tablet (4 mg total) by mouth every 8 (eight) hours as needed for nausea or vomiting. 20 tablet 0  . promethazine (PHENERGAN) 12.5 MG tablet Take 1 tablet (12.5 mg total) by mouth every 6 (six) hours as needed for nausea. 20 tablet 0  . rosuvastatin (CRESTOR) 20 MG tablet TAKE ONE TABLET BY MOUTH ONCE DAILY IN THE EVENING 90 tablet 1  . temazepam (RESTORIL) 15 MG capsule Take 2 capsules by mouth at bedtime.    .Marland Kitchen  zolpidem (AMBIEN) 10 MG tablet Take 2 tablets by mouth at bedtime.      No current facility-administered medications on file prior to visit.     Allergies  Allergen Reactions  . Sulfa Antibiotics Hives  . Famciclovir Other (See Comments)  . Metronidazole Nausea And Vomiting  . Soma [Carisoprodol] Other (See Comments)    Moody and sleepy Cognitive Function, Daytime sleepiness  . Acyclovir And Related   . Benadryl [Diphenhydramine Hcl]     Severe emotional reaction and doesn't work well with Pt. Past history  . Cephalosporins Hives  . Codeine Other (See Comments)    Makes patient feel odd ; "sometimes mild; sometimes severe reaction; mostly severe"  . Cyclobenzaprine Other (See Comments)    Muscle Aches.  . Diclofenac Sodium Nausea And Vomiting  .  Linzess [Linaclotide] Diarrhea  . Nitrofurantoin Monohyd Macro     Felt funny  . Prednisone Other (See Comments)    migraine  . Tizanidine     Other reaction(s): Other (See Comments) insomnia  . Baclofen Nausea Only  . Meloxicam     Other reaction(s): Confusion  . Sulfamethoxazole Rash    Family History  Problem Relation Age of Onset  . Heart attack Father 9       Living  . Arthritis Father   . Skin cancer Father   . Hypertension Father   . Hyperlipidemia Father   . Leukemia Mother 67       Deceased  . Alzheimer's disease Paternal Aunt        X2  . Stomach cancer Paternal Aunt        x1  . Arthritis/Rheumatoid Paternal Aunt        x1  . Neuropathy Brother        Peripheal  . Fibromyalgia Sister   . HIV Sister   . Arthritis/Rheumatoid Sister   . Diabetes Paternal Grandmother     Social History   Social History  . Marital status: Married    Spouse name: N/A  . Number of children: 3  . Years of education: 2 yrs coll   Occupational History  . Disabled    Social History Main Topics  . Smoking status: Former Smoker    Packs/day: 1.00    Years: 20.00    Types: Cigarettes    Quit date: 02/29/2004  . Smokeless tobacco: Never Used  . Alcohol use 0.0 oz/week     Comment: "glass of wine q once in awhile; not very often; do it on special occasion"  . Drug use: No  . Sexual activity: No   Other Topics Concern  . None   Social History Narrative   Completed 2 years of college.    On disability.   Lives at home with her husband.   No caffeine use.   Left-handed.   Three children, 2 biological - 1 adopted.   Review of Systems - See HPI.  All other ROS are negative.  BP (!) 150/80   Pulse 78   Temp 98.3 F (36.8 C) (Oral)   Resp 14   Ht _0  (1.626 m)   Wt 191 lb (86.6 kg)   SpO2 97%   BMI 32.79 kg/m   Physical Exam  Constitutional: She is oriented to person, place, and time and well-developed, well-nourished, and in no distress.  HENT:  Head:  Normocephalic and atraumatic.  Cardiovascular: Normal rate, regular rhythm, normal heart sounds and intact distal pulses.   Pulmonary/Chest: Effort normal.  Musculoskeletal:  Right knee: She exhibits abnormal meniscus. She exhibits normal range of motion, no swelling, no erythema, normal alignment, no LCL laxity, normal patellar mobility, no bony tenderness and no MCL laxity. Tenderness found. Medial joint line tenderness noted.  Neurological: She is alert and oriented to person, place, and time.  Skin: Skin is warm and dry. No rash noted.  Psychiatric: Affect normal.  Vitals reviewed.  Recent Results (from the past 2160 hour(s))  CBC     Status: None   Collection Time: 09/01/16 11:24 AM  Result Value Ref Range   WBC 10.2 4.0 - 10.5 K/uL   RBC 4.72 3.87 - 5.11 MIL/uL   Hemoglobin 13.5 12.0 - 15.0 g/dL   HCT 40.3 36.0 - 46.0 %   MCV 85.4 78.0 - 100.0 fL   MCH 28.6 26.0 - 34.0 pg   MCHC 33.5 30.0 - 36.0 g/dL   RDW 14.0 11.5 - 15.5 %   Platelets 257 150 - 400 K/uL  Comprehensive metabolic panel     Status: Abnormal   Collection Time: 09/01/16 11:24 AM  Result Value Ref Range   Sodium 138 135 - 145 mmol/L   Potassium 3.9 3.5 - 5.1 mmol/L   Chloride 104 101 - 111 mmol/L   CO2 26 22 - 32 mmol/L   Glucose, Bld 149 (H) 65 - 99 mg/dL   BUN 15 6 - 20 mg/dL   Creatinine, Ser 0.61 0.44 - 1.00 mg/dL   Calcium 9.0 8.9 - 10.3 mg/dL   Total Protein 7.4 6.5 - 8.1 g/dL   Albumin 4.0 3.5 - 5.0 g/dL   AST 18 15 - 41 U/L   ALT 18 14 - 54 U/L   Alkaline Phosphatase 68 38 - 126 U/L   Total Bilirubin 0.8 0.3 - 1.2 mg/dL   GFR calc non Af Amer >60 >60 mL/min   GFR calc Af Amer >60 >60 mL/min    Comment: (NOTE) The eGFR has been calculated using the CKD EPI equation. This calculation has not been validated in all clinical situations. eGFR's persistently <60 mL/min signify possible Chronic Kidney Disease.    Anion gap 8 5 - 15  Troponin I     Status: None   Collection Time: 09/01/16 11:24  AM  Result Value Ref Range   Troponin I <0.03 <0.03 ng/mL  Troponin I     Status: None   Collection Time: 09/01/16  2:19 PM  Result Value Ref Range   Troponin I <0.03 <0.03 ng/mL  Hepatitis C antibody     Status: None   Collection Time: 10/25/16  1:50 PM  Result Value Ref Range   HCV Ab NON-REACTIVE NON-REACTIVE    Comment:                                                                        This test is for screening purposes only.  Reactive results should be confirmed by an alternative method.  Suggest HCV Qualitative, PCR, test code 83130.  Specimens will be stable for reflex testing up to 3 days after collection.     Assessment/Plan: 1. Tear of meniscus of right knee as current injury, unspecified meniscus, unspecified tear type, initial encounter Abnormal meniscal testing on exam. Reviewed prior records  on file, specifically MRI R knee report ordered by Dr. Tonita Cong on 08/29/2015 revealing "body, posterior junction and posterior horn medial meniscus tear/re-tear with displaced fragment in the superior medial gutter and along the femoral surface posterior root/posterior horn". Giving extent of meniscal tear highly doubtful this would have improved on its own without surgical intervention. Discussed with patient that surgery is the definitive treatment and current symptoms were most likely due from these tears and worsening OA. She agrees. Supportive measures reviewed. Will repeat MRI today to assess change. WIll need Ortho assessment once complete.  - MR Knee Right Wo Contrast; Future   Leeanne Rio, PA-C

## 2016-10-18 DIAGNOSIS — R197 Diarrhea, unspecified: Secondary | ICD-10-CM | POA: Diagnosis not present

## 2016-10-18 DIAGNOSIS — N816 Rectocele: Secondary | ICD-10-CM | POA: Diagnosis not present

## 2016-10-18 DIAGNOSIS — R1032 Left lower quadrant pain: Secondary | ICD-10-CM | POA: Diagnosis not present

## 2016-10-18 DIAGNOSIS — R8299 Other abnormal findings in urine: Secondary | ICD-10-CM | POA: Diagnosis not present

## 2016-10-18 DIAGNOSIS — K5900 Constipation, unspecified: Secondary | ICD-10-CM | POA: Diagnosis not present

## 2016-10-20 ENCOUNTER — Other Ambulatory Visit (HOSPITAL_BASED_OUTPATIENT_CLINIC_OR_DEPARTMENT_OTHER): Payer: Self-pay | Admitting: Obstetrics and Gynecology

## 2016-10-20 ENCOUNTER — Encounter (HOSPITAL_BASED_OUTPATIENT_CLINIC_OR_DEPARTMENT_OTHER): Payer: Self-pay

## 2016-10-20 ENCOUNTER — Ambulatory Visit (HOSPITAL_BASED_OUTPATIENT_CLINIC_OR_DEPARTMENT_OTHER)
Admission: RE | Admit: 2016-10-20 | Discharge: 2016-10-20 | Disposition: A | Discharge status: Home / Self Care | Payer: 59 | Source: Ambulatory Visit | Attending: Obstetrics and Gynecology | Admitting: Obstetrics and Gynecology

## 2016-10-20 DIAGNOSIS — I700 Atherosclerosis of aorta: Secondary | ICD-10-CM | POA: Diagnosis not present

## 2016-10-20 DIAGNOSIS — R1032 Left lower quadrant pain: Secondary | ICD-10-CM | POA: Diagnosis not present

## 2016-10-20 DIAGNOSIS — N281 Cyst of kidney, acquired: Secondary | ICD-10-CM | POA: Insufficient documentation

## 2016-10-20 DIAGNOSIS — K5730 Diverticulosis of large intestine without perforation or abscess without bleeding: Secondary | ICD-10-CM | POA: Diagnosis not present

## 2016-10-20 MED ORDER — IOPAMIDOL (ISOVUE-300) INJECTION 61%
100.0000 mL | Freq: Once | INTRAVENOUS | Status: AC | PRN
  Administered 2016-10-20: 100 mL via INTRAVENOUS

## 2016-10-21 ENCOUNTER — Encounter: Payer: Self-pay | Admitting: Physician Assistant

## 2016-10-24 ENCOUNTER — Telehealth: Payer: Self-pay | Admitting: Neurology

## 2016-10-24 ENCOUNTER — Other Ambulatory Visit: Payer: Self-pay | Admitting: Physician Assistant

## 2016-10-24 DIAGNOSIS — Z1159 Encounter for screening for other viral diseases: Secondary | ICD-10-CM

## 2016-10-24 NOTE — Telephone Encounter (Signed)
Appointment Request From: Jasmine [name]. Jasmine [name]    With Provider: Dennie Bible, NP [[facility]]    Preferred Date Range: Any date 10/24/2016 or later    Preferred Times: Any    Reason: To address the following health maintenance concerns.  Influenza Vaccine    Comments:  Please get Dr [name] off for Wednesday. I said,in my first message u r a great doctor. Took so much effort to understand app no no show me. Again, sorry confusion my son sent to u .You always care and respect. U go out of ur way .You r great! However now u told me front desk did errors of not sending referrals whole lots and prescriptions and say on phone time . I heard nine she didn't put in computer.Why I got upset I waited 22 minutes patient took wait only five minutes. That why I was tee off at her. I thought u corrected me she doesn't send referrals or anything. Why I also got upset thinking she did. Please again have her say I am sorry! Have a great day all of u guys see u We'd at ten. Thank u!

## 2016-10-24 NOTE — Telephone Encounter (Signed)
LMVM for pt to return call if needs [place], looks like may have meant to go to [place]. PA (pcp). She will call back if needed.

## 2016-10-24 NOTE — Telephone Encounter (Signed)
Patient called stating she would like to know more about the hep c testing and would also like to have them done at this location because the Lab person at [facility] hurts. I told patient I would add to her note and let provider know

## 2016-10-25 ENCOUNTER — Other Ambulatory Visit (INDEPENDENT_AMBULATORY_CARE_PROVIDER_SITE_OTHER): Payer: 59

## 2016-10-25 ENCOUNTER — Ambulatory Visit: Payer: 59

## 2016-10-25 DIAGNOSIS — N760 Acute vaginitis: Secondary | ICD-10-CM | POA: Diagnosis not present

## 2016-10-25 DIAGNOSIS — Z1159 Encounter for screening for other viral diseases: Secondary | ICD-10-CM

## 2016-10-25 DIAGNOSIS — N951 Menopausal and female climacteric states: Secondary | ICD-10-CM | POA: Diagnosis not present

## 2016-10-26 ENCOUNTER — Encounter: Payer: Self-pay | Admitting: Physician Assistant

## 2016-10-26 LAB — HEPATITIS C ANTIBODY: HCV AB: NONREACTIVE

## 2016-10-29 DIAGNOSIS — M25661 Stiffness of right knee, not elsewhere classified: Secondary | ICD-10-CM | POA: Diagnosis not present

## 2016-10-29 DIAGNOSIS — M791 Myalgia: Secondary | ICD-10-CM | POA: Diagnosis not present

## 2016-11-01 ENCOUNTER — Ambulatory Visit (INDEPENDENT_AMBULATORY_CARE_PROVIDER_SITE_OTHER): Payer: 59 | Admitting: Physician Assistant

## 2016-11-01 ENCOUNTER — Encounter: Payer: Self-pay | Admitting: Physician Assistant

## 2016-11-01 VITALS — BP 150/88 | HR 70 | Temp 98.2°F | Resp 16 | Ht 64.0 in | Wt 185.0 lb

## 2016-11-01 DIAGNOSIS — M23203 Derangement of unspecified medial meniscus due to old tear or injury, right knee: Secondary | ICD-10-CM

## 2016-11-01 NOTE — Patient Instructions (Signed)
Please continue chronic medications as directed. Speak with the ladies up front to discuss getting records from the chiropractor.  You will be contacted for assessment by Dr. Reather Littler office.   Please try to send me a test message letter so we can make sure the communication lines are open!

## 2016-11-02 ENCOUNTER — Encounter: Payer: Self-pay | Admitting: Physician Assistant

## 2016-11-02 NOTE — Progress Notes (Signed)
Patient presents to clinic today to review MRI results. Patient with history of recurrent meniscal tear an significant OA of R knee. Patient had MRI through imaging dept and [facility]. A copy or MRI report has been faxed to the office.   Past Medical History:  Diagnosis Date  . Anxiety   . Asthma   . Cervicogenic headache 08/24/2016  . Chronic high back pain   . Double vision   . Fibromyalgia   . Heart murmur   . High cholesterol   . Hypertension   . Hypoglycemia   . Kidney stone   . Meniscus tear    Right  . Migraine 08/07/2015  . PTSD (post-traumatic stress disorder)   . Schizophrenia ([place])    treated by Dr. Casimiro Needle  . Seizures ([place])    last seizure 3 years ago, not on AED  . Stroke ([place]) 07/08/11   "I've had mini strokes before; left side of face  is more down than right "  . Syncope and collapse    One episode - 07/10/15    Current Outpatient Prescriptions on File Prior to Visit  Medication Sig Dispense Refill  . albuterol (PROVENTIL HFA;VENTOLIN HFA) 108 (90 Base) MCG/ACT inhaler Inhale 2 puffs into the lungs every 6 (six) hours as needed for wheezing or shortness of breath. 1 Inhaler 2  . azelastine (ASTELIN) 0.1 % nasal spray Place 1 spray into both nostrils 2 (two) times daily. Use in each nostril as directed 30 mL 12  . fluticasone (FLONASE) 50 MCG/ACT nasal spray USE TWO SPRAY(S) IN EACH NOSTRIL ONCE DAILY 16 g 5  . fluticasone-salmeterol (ADVAIR HFA) 115-21 MCG/ACT inhaler Inhale 1 puff into the lungs 2 (two) times daily. 12 g 5  . gabapentin (NEURONTIN) 600 MG tablet Take 1 tablet (600 mg total) by mouth 3 (three) times daily. 90 tablet 3  . ibuprofen (ADVIL,MOTRIN) 600 MG tablet Take 1 tablet (600 mg total) by mouth every 8 (eight) hours as needed. 30 tablet 0  . losartan (COZAAR) 25 MG tablet Take 1 tablet (25 mg total) by mouth 2 (two) times daily. 60 tablet 5  . metoprolol succinate (TOPROL-XL) 25 MG 24 hr tablet Take 3 tablets (75 mg total) by  mouth daily. Take 3 tablets daily (54m ) 90 tablet 5  . Multiple Vitamin (THERA) TABS Take by mouth.    . promethazine (PHENERGAN) 12.5 MG tablet Take 1 tablet (12.5 mg total) by mouth every 6 (six) hours as needed for nausea. 20 tablet 0  . rosuvastatin (CRESTOR) 20 MG tablet TAKE ONE TABLET BY MOUTH ONCE DAILY IN THE EVENING 90 tablet 1  . temazepam (RESTORIL) 15 MG capsule Take 2 capsules by mouth at bedtime.    .Marland Kitchenzolpidem (AMBIEN) 10 MG tablet Take 2 tablets by mouth at bedtime.      No current facility-administered medications on file prior to visit.     Allergies  Allergen Reactions  . Sulfa Antibiotics Hives  . Famciclovir Other (See Comments)  . Metronidazole Nausea And Vomiting  . Soma [Carisoprodol] Other (See Comments)    Moody and sleepy Cognitive Function, Daytime sleepiness  . Acyclovir And Related   . Benadryl [Diphenhydramine Hcl]     Severe emotional reaction and doesn't work well with Pt. Past history  . Cephalosporins Hives  . Codeine Other (See Comments)    Makes patient feel odd ; "sometimes mild; sometimes severe reaction; mostly severe"  . Cyclobenzaprine Other (See Comments)    Muscle  Aches.  . Diclofenac Sodium Nausea And Vomiting  . Linzess [Linaclotide] Diarrhea  . Nitrofurantoin Monohyd Macro     Felt funny  . Prednisone Other (See Comments)    migraine  . Tizanidine     Other reaction(s): Other (See Comments) insomnia  . Baclofen Nausea Only  . Meloxicam     Other reaction(s): Confusion  . Sulfamethoxazole Rash    Family History  Problem Relation Age of Onset  . Heart attack Father 10       Living  . Arthritis Father   . Skin cancer Father   . Hypertension Father   . Hyperlipidemia Father   . Leukemia Mother 61       Deceased  . Alzheimer's disease Paternal Aunt        X2  . Stomach cancer Paternal Aunt        x1  . Arthritis/Rheumatoid Paternal Aunt        x1  . Neuropathy Brother        Peripheal  . Fibromyalgia Sister   .  HIV Sister   . Arthritis/Rheumatoid Sister   . Diabetes Paternal Grandmother     Social History   Social History  . Marital status: Married    Spouse name: N/A  . Number of children: 3  . Years of education: 2 yrs coll   Occupational History  . Disabled    Social History Main Topics  . Smoking status: Former Smoker    Packs/day: 1.00    Years: 20.00    Types: Cigarettes    Quit date: 02/29/2004  . Smokeless tobacco: Never Used  . Alcohol use 0.0 oz/week     Comment: "glass of wine q once in awhile; not very often; do it on special occasion"  . Drug use: No  . Sexual activity: No   Other Topics Concern  . None   Social History Narrative   Completed 2 years of college.    On disability.   Lives at home with her husband.   No caffeine use.   Left-handed.   Three children, 2 biological - 1 adopted.    Review of Systems - See HPI.  All other ROS are negative.  BP (!) 150/88   Pulse 70   Temp 98.2 F (36.8 C) (Oral)   Resp 16   Ht 5' 4"  (1.626 m)   Wt 185 lb (83.9 kg)   SpO2 99%   BMI 31.76 kg/m   Physical Exam  Constitutional: She is well-developed, well-nourished, and in no distress.  HENT:  Head: Normocephalic and atraumatic.  Eyes: Conjunctivae are normal.  Neck: Neck supple.  Cardiovascular: Normal rate, regular rhythm, normal heart sounds and intact distal pulses.   Pulmonary/Chest: Effort normal.  Skin: Skin is warm and dry. No rash noted.  Psychiatric: Affect normal.  Vitals reviewed.   Recent Results (from the past 2160 hour(s))  CBC     Status: None   Collection Time: 09/01/16 11:24 AM  Result Value Ref Range   WBC 10.2 4.0 - 10.5 K/uL   RBC 4.72 3.87 - 5.11 MIL/uL   Hemoglobin 13.5 12.0 - 15.0 g/dL   HCT 40.3 36.0 - 46.0 %   MCV 85.4 78.0 - 100.0 fL   MCH 28.6 26.0 - 34.0 pg   MCHC 33.5 30.0 - 36.0 g/dL   RDW 14.0 11.5 - 15.5 %   Platelets 257 150 - 400 K/uL  Comprehensive metabolic panel     Status: Abnormal  Collection Time:  09/01/16 11:24 AM  Result Value Ref Range   Sodium 138 135 - 145 mmol/L   Potassium 3.9 3.5 - 5.1 mmol/L   Chloride 104 101 - 111 mmol/L   CO2 26 22 - 32 mmol/L   Glucose, Bld 149 (H) 65 - 99 mg/dL   BUN 15 6 - 20 mg/dL   Creatinine, Ser 0.61 0.44 - 1.00 mg/dL   Calcium 9.0 8.9 - 10.3 mg/dL   Total Protein 7.4 6.5 - 8.1 g/dL   Albumin 4.0 3.5 - 5.0 g/dL   AST 18 15 - 41 U/L   ALT 18 14 - 54 U/L   Alkaline Phosphatase 68 38 - 126 U/L   Total Bilirubin 0.8 0.3 - 1.2 mg/dL   GFR calc non Af Amer >60 >60 mL/min   GFR calc Af Amer >60 >60 mL/min    Comment: (NOTE) The eGFR has been calculated using the CKD EPI equation. This calculation has not been validated in all clinical situations. eGFR's persistently <60 mL/min signify possible Chronic Kidney Disease.    Anion gap 8 5 - 15  Troponin I     Status: None   Collection Time: 09/01/16 11:24 AM  Result Value Ref Range   Troponin I <0.03 <0.03 ng/mL  Troponin I     Status: None   Collection Time: 09/01/16  2:19 PM  Result Value Ref Range   Troponin I <0.03 <0.03 ng/mL  Hepatitis C antibody     Status: None   Collection Time: 10/25/16  1:50 PM  Result Value Ref Range   HCV Ab NON-REACTIVE NON-REACTIVE    Comment:                                                                        This test is for screening purposes only.  Reactive results should be confirmed by an alternative method.  Suggest HCV Qualitative, PCR, test code 83130.  Specimens will be stable for reflex testing up to 3 days after collection.     Assessment/Plan: 1. Old tear of medial meniscus of right knee, unspecified tear type MRI impression as follows "(1) Presumed partial menisectomy changes medially with displaced meniscal tissue noted atop the periphery of the residual posterior horn, compatible with a recurrent tear. Severe chondral thinning with foci of full thickness chondral loss is seen along both sides of medial compartment, and the osteoarthritis at  the medial compartment has significantly progressed. (2) Small free edge horizontal tear at the posterior junction of the lateral mensicus. (3) Moderate chondral thinning and small chondral fissures at the medial patellar facet, mildly progressed. (4) moderately large joint effusion". Referral placed back to Dr. Tonita Cong at [facility] for further management. Patient very upset that her chiropractor charged her 2100.00 for laser treatments with the promise it would fix her issue. Discussed with her that unfortunately we have no control regarding outside treatments she has had. Will get records from chiropractor to sent to [place] for review. She does not want surgery but is aware that this will likely be the only step for alleviation of issues.    Leeanne Rio, PA-C

## 2016-11-03 ENCOUNTER — Encounter: Payer: Self-pay | Admitting: Physician Assistant

## 2016-11-03 MED ORDER — LOSARTAN POTASSIUM 25 MG PO TABS: 25.0000 mg | ORAL_TABLET | Freq: Two times a day (BID) | ORAL | 1 refills | Status: DC

## 2016-11-03 MED ORDER — METOPROLOL SUCCINATE ER 25 MG PO TB24: 75.0000 mg | ORAL_TABLET | Freq: Every day | ORAL | 1 refills | Status: DC

## 2016-11-03 NOTE — Telephone Encounter (Signed)
Jasmine [name] -- please see MyChart message. Patient had a no-show fee that was not supposed to occur. Can you reach out to patient and take care of this? Thank you.

## 2016-11-04 ENCOUNTER — Encounter: Payer: Self-pay | Admitting: Physician Assistant

## 2016-11-07 DIAGNOSIS — N760 Acute vaginitis: Secondary | ICD-10-CM | POA: Diagnosis not present

## 2016-11-08 ENCOUNTER — Encounter: Payer: Self-pay | Admitting: Physician Assistant

## 2016-11-08 DIAGNOSIS — M1711 Unilateral primary osteoarthritis, right knee: Secondary | ICD-10-CM | POA: Diagnosis not present

## 2016-11-08 DIAGNOSIS — M25561 Pain in right knee: Secondary | ICD-10-CM | POA: Diagnosis not present

## 2016-11-08 DIAGNOSIS — G8929 Other chronic pain: Secondary | ICD-10-CM | POA: Diagnosis not present

## 2016-11-08 DIAGNOSIS — M170 Bilateral primary osteoarthritis of knee: Secondary | ICD-10-CM | POA: Diagnosis not present

## 2016-11-14 ENCOUNTER — Encounter: Payer: Self-pay | Admitting: Physician Assistant

## 2016-11-16 ENCOUNTER — Encounter: Payer: Self-pay | Admitting: Physician Assistant

## 2016-11-17 ENCOUNTER — Telehealth: Payer: Self-pay | Admitting: *Deleted

## 2016-11-17 NOTE — Telephone Encounter (Signed)
Pt called back and has been schedule with [name].

## 2016-11-17 NOTE — Telephone Encounter (Signed)
I have responded to each -- the last time being this morning when I was attempting to help her to schedule an appointment for tomorrow giving her appointment options. I am assuming she is having technical difficulty checking her messages. I have showed her how to use MyChart several times so I am not sure what the issue is. I am happy to print out my responses to her messages so she can see them.   Patina if you could reach out to her and let her know that I have reviewed her messages and responded, most recently this morning, I would appreciate it. Also see if she will come see me tomorrow morning or if we can block a time slot in my schedule tomorrow so that I can call her personally.

## 2016-11-17 NOTE — Telephone Encounter (Signed)
Called patient LMOVM advising to schedule an appointment with [facility] in the AM (50min) to discuss her issues per MyChart messages.

## 2016-11-17 NOTE — Telephone Encounter (Signed)
Patient called stating that she has sent mychart messages over the last 7 days and she is upset that no one is responding to her.  I looked through the chart and saw that the messages since 9/11 have been answered by PCP. Patient states that this is not true.  She states that she wants to speak with provider directly and would not give me any additional information.  I explained that provider was out of the office for the day but that I would send a message back - pt stated "well call me and tell me when he's out" and she hung up.

## 2016-11-18 ENCOUNTER — Encounter: Payer: Self-pay | Admitting: Physician Assistant

## 2016-11-18 ENCOUNTER — Ambulatory Visit (INDEPENDENT_AMBULATORY_CARE_PROVIDER_SITE_OTHER): Payer: 59 | Admitting: Physician Assistant

## 2016-11-18 VITALS — BP 128/80 | HR 88 | Temp 98.5°F | Resp 14 | Ht 64.0 in | Wt 179.0 lb

## 2016-11-18 DIAGNOSIS — L749 Eccrine sweat disorder, unspecified: Secondary | ICD-10-CM

## 2016-11-18 DIAGNOSIS — Z23 Encounter for immunization: Secondary | ICD-10-CM

## 2016-11-18 LAB — COMPREHENSIVE METABOLIC PANEL
ALBUMIN: 4.3 g/dL (ref 3.5–5.2)
ALK PHOS: 56 U/L (ref 39–117)
ALT: 22 U/L (ref 0–35)
AST: 24 U/L (ref 0–37)
BUN: 18 mg/dL (ref 6–23)
CALCIUM: 10 mg/dL (ref 8.4–10.5)
CHLORIDE: 109 meq/L (ref 96–112)
CO2: 25 mEq/L (ref 19–32)
CREATININE: 0.76 mg/dL (ref 0.40–1.20)
GFR: 82.61 mL/min (ref >60.00)
Glucose, Bld: 97 mg/dL (ref 70–99)
POTASSIUM: 4.1 meq/L (ref 3.5–5.1)
SODIUM: 142 meq/L (ref 135–145)
TOTAL PROTEIN: 6.4 g/dL (ref 6.0–8.3)
Total Bilirubin: 0.6 mg/dL (ref 0.2–1.2)

## 2016-11-18 LAB — CBC WITH DIFFERENTIAL/PLATELET
BASOS ABS: 0 10*3/uL (ref 0.0–0.1)
Basophils Relative: 0.6 % (ref 0.0–3.0)
EOS ABS: 0.3 10*3/uL (ref 0.0–0.7)
Eosinophils Relative: 3.4 % (ref 0.0–5.0)
HEMATOCRIT: 39.5 % (ref 36.0–46.0)
HEMOGLOBIN: 13 g/dL (ref 12.0–15.0)
LYMPHS PCT: 32.5 % (ref 12.0–46.0)
Lymphs Abs: 2.6 10*3/uL (ref 0.7–4.0)
MCHC: 32.9 g/dL (ref 30.0–36.0)
MCV: 86 fl (ref 78.0–100.0)
MONO ABS: 0.6 10*3/uL (ref 0.1–1.0)
Monocytes Relative: 7.8 % (ref 3.0–12.0)
Neutro Abs: 4.5 10*3/uL (ref 1.4–7.7)
Neutrophils Relative %: 55.7 % (ref 43.0–77.0)
Platelets: 257 10*3/uL (ref 150.0–400.0)
RBC: 4.59 Mil/uL (ref 3.87–5.11)
RDW: 15.1 % (ref 11.5–15.5)
WBC: 8.1 10*3/uL (ref 4.0–10.5)

## 2016-11-18 LAB — TSH: TSH: 1.76 u[IU]/mL (ref 0.35–4.50)

## 2016-11-18 MED ORDER — DICLOFENAC SODIUM 1 % TD GEL: 2.0000 g | Freq: Four times a day (QID) | TRANSDERMAL | 0 refills | Status: DC

## 2016-11-18 MED ORDER — POLYETHYLENE GLYCOL 3350 17 GM/SCOOP PO POWD: 17.0000 g | Freq: Two times a day (BID) | ORAL | 1 refills | Status: DC | PRN

## 2016-11-18 NOTE — Patient Instructions (Addendum)
Pease go to the lab for blood work. I will call with results. Please start the daily Miralax as directed. I am sending in a prescription.  I will call you when I have spoken with [facility].

## 2016-11-18 NOTE — Progress Notes (Signed)
Patient presents to clinic today c/o intermittent episodes of sweating over the past several months. Has been postmenopausal for some time and has history of hot flashes but states these feel different to her. Denies pattern of timing of symptoms. Sometimes will note in the morning. Other times in the evening. Is more generalized and non-focal. Denies palpitations, lightheadedness, shortness of breath or chest pain during episodes. States her sister has similar issues. Would like labs drawn today.  Also is due for flu shot. Agrees to have today.  Past Medical History:  Diagnosis Date  . Anxiety   . Asthma   . Cervicogenic headache 08/24/2016  . Chronic high back pain   . Double vision   . Fibromyalgia   . Heart murmur   . High cholesterol   . Hypertension   . Hypoglycemia   . Kidney stone   . Meniscus tear    Right  . Migraine 08/07/2015  . PTSD (post-traumatic stress disorder)   . Schizophrenia ([place])    treated by Dr. Casimiro Needle  . Seizures ([place])    last seizure 3 years ago, not on AED  . Stroke ([place]) 07/08/11   "I've had mini strokes before; left side of face  is more down than right "  . Syncope and collapse    One episode - 07/10/15    Current Outpatient Prescriptions on File Prior to Visit  Medication Sig Dispense Refill  . albuterol (PROVENTIL HFA;VENTOLIN HFA) 108 (90 Base) MCG/ACT inhaler Inhale 2 puffs into the lungs every 6 (six) hours as needed for wheezing or shortness of breath. 1 Inhaler 2  . azelastine (ASTELIN) 0.1 % nasal spray Place 1 spray into both nostrils 2 (two) times daily. Use in each nostril as directed 30 mL 12  . fluticasone (FLONASE) 50 MCG/ACT nasal spray USE TWO SPRAY(S) IN EACH NOSTRIL ONCE DAILY 16 g 5  . fluticasone-salmeterol (ADVAIR HFA) 115-21 MCG/ACT inhaler Inhale 1 puff into the lungs 2 (two) times daily. 12 g 5  . gabapentin (NEURONTIN) 600 MG tablet Take 1 tablet (600 mg total) by mouth 3 (three) times daily. 90 tablet 3  . ibuprofen  (ADVIL,MOTRIN) 600 MG tablet Take 1 tablet (600 mg total) by mouth every 8 (eight) hours as needed. 30 tablet 0  . losartan (COZAAR) 25 MG tablet Take 1 tablet (25 mg total) by mouth 2 (two) times daily. 180 tablet 1  . metoprolol succinate (TOPROL-XL) 25 MG 24 hr tablet Take 3 tablets (75 mg total) by mouth daily. Take 3 tablets daily (10m ) 270 tablet 1  . Multiple Vitamin (THERA) TABS Take by mouth.    . promethazine (PHENERGAN) 12.5 MG tablet Take 1 tablet (12.5 mg total) by mouth every 6 (six) hours as needed for nausea. 20 tablet 0  . rosuvastatin (CRESTOR) 20 MG tablet TAKE ONE TABLET BY MOUTH ONCE DAILY IN THE EVENING 90 tablet 1  . temazepam (RESTORIL) 15 MG capsule Take 2 capsules by mouth at bedtime.    .Marland Kitchenzolpidem (AMBIEN) 10 MG tablet Take 2 tablets by mouth at bedtime.      No current facility-administered medications on file prior to visit.     Allergies  Allergen Reactions  . Sulfa Antibiotics Hives  . Famciclovir Other (See Comments)  . Metronidazole Nausea And Vomiting  . Soma [Carisoprodol] Other (See Comments)    Moody and sleepy Cognitive Function, Daytime sleepiness  . Acyclovir And Related   . Benadryl [Diphenhydramine Hcl]     Severe  emotional reaction and doesn't work well with Pt. Past history  . Cephalosporins Hives  . Codeine Other (See Comments)    Makes patient feel odd ; "sometimes mild; sometimes severe reaction; mostly severe"  . Cyclobenzaprine Other (See Comments)    Muscle Aches.  . Diclofenac Sodium Nausea And Vomiting  . Linzess [Linaclotide] Diarrhea  . Nitrofurantoin Monohyd Macro     Felt funny  . Prednisone Other (See Comments)    migraine  . Tizanidine     Other reaction(s): Other (See Comments) insomnia  . Baclofen Nausea Only  . Meloxicam     Other reaction(s): Confusion  . Sulfamethoxazole Rash    Family History  Problem Relation Age of Onset  . Heart attack Father 23       Living  . Arthritis Father   . Skin cancer Father    . Hypertension Father   . Hyperlipidemia Father   . Leukemia Mother 76       Deceased  . Alzheimer's disease Paternal Aunt        X2  . Stomach cancer Paternal Aunt        x1  . Arthritis/Rheumatoid Paternal Aunt        x1  . Neuropathy Brother        Peripheal  . Fibromyalgia Sister   . HIV Sister   . Arthritis/Rheumatoid Sister   . Diabetes Paternal Grandmother     Social History   Social History  . Marital status: Married    Spouse name: N/A  . Number of children: 3  . Years of education: 2 yrs coll   Occupational History  . Disabled    Social History Main Topics  . Smoking status: Former Smoker    Packs/day: 1.00    Years: 20.00    Types: Cigarettes    Quit date: 02/29/2004  . Smokeless tobacco: Never Used  . Alcohol use 0.0 oz/week     Comment: "glass of wine q once in awhile; not very often; do it on special occasion"  . Drug use: No  . Sexual activity: No   Other Topics Concern  . None   Social History Narrative   Completed 2 years of college.    On disability.   Lives at home with her husband.   No caffeine use.   Left-handed.   Three children, 2 biological - 1 adopted.   Review of Systems - See HPI.  All other ROS are negative.  BP 128/80   Pulse 88   Temp 98.5 F (36.9 C) (Oral)   Resp 14   Ht 5' 4"  (1.626 m)   Wt 179 lb (81.2 kg)   SpO2 98%   BMI 30.73 kg/m   Physical Exam  Constitutional: She is oriented to person, place, and time and well-developed, well-nourished, and in no distress.  HENT:  Head: Normocephalic and atraumatic.  Eyes: Conjunctivae are normal.  Neck: No thyromegaly present.  Cardiovascular: Normal rate, regular rhythm, normal heart sounds and intact distal pulses.   Pulmonary/Chest: Breath sounds normal. No respiratory distress. She has no wheezes. She has no rales. She exhibits no tenderness.  Lymphadenopathy:    She has no cervical adenopathy.  Neurological: She is alert and oriented to person, place, and time.    Skin: Skin is warm and dry. No rash noted.  Psychiatric: Affect normal.  Vitals reviewed.   Recent Results (from the past 2160 hour(s))  CBC     Status: None   Collection Time:  09/01/16 11:24 AM  Result Value Ref Range   WBC 10.2 4.0 - 10.5 K/uL   RBC 4.72 3.87 - 5.11 MIL/uL   Hemoglobin 13.5 12.0 - 15.0 g/dL   HCT 40.3 36.0 - 46.0 %   MCV 85.4 78.0 - 100.0 fL   MCH 28.6 26.0 - 34.0 pg   MCHC 33.5 30.0 - 36.0 g/dL   RDW 14.0 11.5 - 15.5 %   Platelets 257 150 - 400 K/uL  Comprehensive metabolic panel     Status: Abnormal   Collection Time: 09/01/16 11:24 AM  Result Value Ref Range   Sodium 138 135 - 145 mmol/L   Potassium 3.9 3.5 - 5.1 mmol/L   Chloride 104 101 - 111 mmol/L   CO2 26 22 - 32 mmol/L   Glucose, Bld 149 (H) 65 - 99 mg/dL   BUN 15 6 - 20 mg/dL   Creatinine, Ser 0.61 0.44 - 1.00 mg/dL   Calcium 9.0 8.9 - 10.3 mg/dL   Total Protein 7.4 6.5 - 8.1 g/dL   Albumin 4.0 3.5 - 5.0 g/dL   AST 18 15 - 41 U/L   ALT 18 14 - 54 U/L   Alkaline Phosphatase 68 38 - 126 U/L   Total Bilirubin 0.8 0.3 - 1.2 mg/dL   GFR calc non Af Amer >60 >60 mL/min   GFR calc Af Amer >60 >60 mL/min    Comment: (NOTE) The eGFR has been calculated using the CKD EPI equation. This calculation has not been validated in all clinical situations. eGFR's persistently <60 mL/min signify possible Chronic Kidney Disease.    Anion gap 8 5 - 15  Troponin I     Status: None   Collection Time: 09/01/16 11:24 AM  Result Value Ref Range   Troponin I <0.03 <0.03 ng/mL  Troponin I     Status: None   Collection Time: 09/01/16  2:19 PM  Result Value Ref Range   Troponin I <0.03 <0.03 ng/mL  Hepatitis C antibody     Status: None   Collection Time: 10/25/16  1:50 PM  Result Value Ref Range   HCV Ab NON-REACTIVE NON-REACTIVE    Comment:                                                                        This test is for screening purposes only.  Reactive results should be confirmed by an alternative  method.  Suggest HCV Qualitative, PCR, test code 83130.  Specimens will be stable for reflex testing up to 3 days after collection.   TSH     Status: None   Collection Time: 11/18/16 10:38 AM  Result Value Ref Range   TSH 1.76 0.35 - 4.50 uIU/mL  Comp Met (CMET)     Status: None   Collection Time: 11/18/16 10:38 AM  Result Value Ref Range   Sodium 142 135 - 145 mEq/L   Potassium 4.1 3.5 - 5.1 mEq/L   Chloride 109 96 - 112 mEq/L   CO2 25 19 - 32 mEq/L   Glucose, Bld 97 70 - 99 mg/dL   BUN 18 6 - 23 mg/dL   Creatinine, Ser 0.76 0.40 - 1.20 mg/dL   Total Bilirubin 0.6 0.2 - 1.2  mg/dL   Alkaline Phosphatase 56 39 - 117 U/L   AST 24 0 - 37 U/L   ALT 22 0 - 35 U/L   Total Protein 6.4 6.0 - 8.3 g/dL   Albumin 4.3 3.5 - 5.2 g/dL   Calcium 10.0 8.4 - 10.5 mg/dL   GFR 82.61 >60.00 mL/min  CBC w/Diff     Status: None   Collection Time: 11/18/16 10:38 AM  Result Value Ref Range   WBC 8.1 4.0 - 10.5 K/uL   RBC 4.59 3.87 - 5.11 Mil/uL   Hemoglobin 13.0 12.0 - 15.0 g/dL   HCT 39.5 36.0 - 46.0 %   MCV 86.0 78.0 - 100.0 fl   MCHC 32.9 30.0 - 36.0 g/dL   RDW 15.1 11.5 - 15.5 %   Platelets 257.0 150.0 - 400.0 K/uL   Neutrophils Relative % 55.7 43.0 - 77.0 %   Lymphocytes Relative 32.5 12.0 - 46.0 %   Monocytes Relative 7.8 3.0 - 12.0 %   Eosinophils Relative 3.4 0.0 - 5.0 %   Basophils Relative 0.6 0.0 - 3.0 %   Neutro Abs 4.5 1.4 - 7.7 K/uL   Lymphs Abs 2.6 0.7 - 4.0 K/uL   Monocytes Absolute 0.6 0.1 - 1.0 K/uL   Eosinophils Absolute 0.3 0.0 - 0.7 K/uL   Basophils Absolute 0.0 0.0 - 0.1 K/uL   Assessment/Plan: 1. Sweating abnormality Exam unremarkable. Labs today to further assess.  - TSH - Comp Met (CMET) - CBC w/Diff  2. Need for immunization against influenza Flu shot updated today. - Flu Vaccine QUAD 36+ mos IM   Leeanne Rio, PA-C

## 2016-11-18 NOTE — Progress Notes (Signed)
Pre visit review using our clinic review tool, if applicable. No additional management support is needed unless otherwise documented below in the visit note. 

## 2016-11-21 ENCOUNTER — Encounter: Payer: Self-pay | Admitting: Neurology

## 2016-11-21 ENCOUNTER — Encounter: Payer: Self-pay | Admitting: Physician Assistant

## 2016-11-21 ENCOUNTER — Telehealth: Payer: Self-pay | Admitting: Physician Assistant

## 2016-11-21 NOTE — Telephone Encounter (Signed)
Patient Name: Jasmine [name]  DOB: 06-Sep-1955    Initial Comment Caller states that his wifes leg has some pretty severe swelling around her ankle and her foot. She has iced it and the swelling has not gone down. She has talked to her Neurologist about it and he said that it could be a blood clot.    Nurse Assessment  Nurse: Seward Speck, RN, Izora Gala Date/Time (Eastern Time): 11/21/2016 4:14:01 PM  Confirm and document reason for call. If symptomatic, describe symptoms. ---Caller states that his wife has severe swelling around her around her right ankle and her foot. She has iced it and the swelling has not gone down. She has talked to her Neurologist about it and he said that it could be a blood clot.  Does the patient have any new or worsening symptoms? ---Yes  Will a triage be completed? ---Yes  Related visit to physician within the last 2 weeks? ---Yes  Does the PT have any chronic conditions? (i.e. diabetes, asthma, etc.) ---Yes  List chronic conditions. ---Fibromyalgia  Is this a behavioral health or substance abuse call? ---No     Guidelines    Guideline Title Affirmed Question Affirmed Notes  Ankle Swelling Swollen ankle joint (Exception: area of localized swelling which is itchy)    Final Disposition User   See PCP When Office is Open (within 3 days) Mills-Hernandez, RN, Izora Gala    Comments  Nurse offered to get caller an appointment but she declined and stated that she had already phoned the doctor's office and someone was trying to call her back while on the phone with nurse so she will just phone the office back to get her appointment scheduled.   Referrals  REFERRED TO PCP OFFICE   Caller Disagree/Comply Comply  Caller Understands Yes  PreDisposition Call Doctor

## 2016-11-21 NOTE — Telephone Encounter (Signed)
FYI: Scheduled for 11/22/16 @1  p

## 2016-11-22 ENCOUNTER — Encounter: Payer: Self-pay | Admitting: Physician Assistant

## 2016-11-22 ENCOUNTER — Ambulatory Visit (HOSPITAL_BASED_OUTPATIENT_CLINIC_OR_DEPARTMENT_OTHER)
Admission: RE | Admit: 2016-11-22 | Discharge: 2016-11-22 | Disposition: A | Discharge status: Home / Self Care | Payer: 59 | Source: Ambulatory Visit | Attending: Physician Assistant | Admitting: Physician Assistant

## 2016-11-22 ENCOUNTER — Ambulatory Visit (INDEPENDENT_AMBULATORY_CARE_PROVIDER_SITE_OTHER): Payer: 59 | Admitting: Physician Assistant

## 2016-11-22 VITALS — BP 140/82 | HR 66 | Temp 99.1°F | Resp 14 | Ht 64.0 in | Wt 180.0 lb

## 2016-11-22 DIAGNOSIS — M7989 Other specified soft tissue disorders: Secondary | ICD-10-CM | POA: Diagnosis not present

## 2016-11-22 NOTE — Progress Notes (Signed)
Patient presents to clinic today c/o swelling in left lower leg from ankle to shin over the past few days. Denies trauma or injury. Denies redness or warmth. Notes some mild tenderness of anterior shin. Denies bruising. Is concerned for blood clot as her sister has had issues previously. Patient denies chest pain or SOB. Denies swelling of other extremities. Applied compression to the area this morning which has helped with the swelling.   Past Medical History:  Diagnosis Date  . Anxiety   . Asthma   . Cervicogenic headache 08/24/2016  . Chronic high back pain   . Double vision   . Fibromyalgia   . Heart murmur   . High cholesterol   . Hypertension   . Hypoglycemia   . Kidney stone   . Meniscus tear    Right  . Migraine 08/07/2015  . PTSD (post-traumatic stress disorder)   . Schizophrenia ([place])    treated by Dr. Casimiro Needle  . Seizures ([place])    last seizure 3 years ago, not on AED  . Stroke ([place]) 07/08/11   "I've had mini strokes before; left side of face  is more down than right "  . Syncope and collapse    One episode - 07/10/15    Current Outpatient Prescriptions on File Prior to Visit  Medication Sig Dispense Refill  . albuterol (PROVENTIL HFA;VENTOLIN HFA) 108 (90 Base) MCG/ACT inhaler Inhale 2 puffs into the lungs every 6 (six) hours as needed for wheezing or shortness of breath. 1 Inhaler 2  . azelastine (ASTELIN) 0.1 % nasal spray Place 1 spray into both nostrils 2 (two) times daily. Use in each nostril as directed 30 mL 12  . diclofenac sodium (VOLTAREN) 1 % GEL Apply 2 g topically 4 (four) times daily. 100 g 0  . docusate sodium (DULCOLAX) 100 MG capsule Take 100 mg by mouth 2 (two) times daily.    . fluticasone (FLONASE) 50 MCG/ACT nasal spray USE TWO SPRAY(S) IN EACH NOSTRIL ONCE DAILY 16 g 5  . fluticasone-salmeterol (ADVAIR HFA) 115-21 MCG/ACT inhaler Inhale 1 puff into the lungs 2 (two) times daily. 12 g 5  . gabapentin (NEURONTIN) 600 MG tablet Take 1 tablet (600 mg  total) by mouth 3 (three) times daily. 90 tablet 3  . ibuprofen (ADVIL,MOTRIN) 600 MG tablet Take 1 tablet (600 mg total) by mouth every 8 (eight) hours as needed. 30 tablet 0  . losartan (COZAAR) 25 MG tablet Take 1 tablet (25 mg total) by mouth 2 (two) times daily. 180 tablet 1  . metoprolol succinate (TOPROL-XL) 25 MG 24 hr tablet Take 3 tablets (75 mg total) by mouth daily. Take 3 tablets daily (20m ) 270 tablet 1  . Multiple Vitamin (THERA) TABS Take by mouth.    . polyethylene glycol powder (GLYCOLAX/MIRALAX) powder Take 17 g by mouth 2 (two) times daily as needed. 3350 g 1  . Probiotic Product (PROBIOTIC ADVANCED PO) Take 1 tablet by mouth daily.    . promethazine (PHENERGAN) 12.5 MG tablet Take 1 tablet (12.5 mg total) by mouth every 6 (six) hours as needed for nausea. 20 tablet 0  . ranitidine (ZANTAC) 150 MG tablet Take 150 mg by mouth at bedtime.    . rosuvastatin (CRESTOR) 20 MG tablet TAKE ONE TABLET BY MOUTH ONCE DAILY IN THE EVENING 90 tablet 1  . temazepam (RESTORIL) 15 MG capsule Take 2 capsules by mouth at bedtime.    .Marland Kitchenzolpidem (AMBIEN) 10 MG tablet Take 2 tablets by  mouth at bedtime.      No current facility-administered medications on file prior to visit.     Allergies  Allergen Reactions  . Sulfa Antibiotics Hives  . Famciclovir Other (See Comments)  . Metronidazole Nausea And Vomiting  . Soma [Carisoprodol] Other (See Comments)    Moody and sleepy Cognitive Function, Daytime sleepiness  . Acyclovir And Related   . Benadryl [Diphenhydramine Hcl]     Severe emotional reaction and doesn't work well with Pt. Past history  . Cephalosporins Hives  . Codeine Other (See Comments)    Makes patient feel odd ; "sometimes mild; sometimes severe reaction; mostly severe"  . Cyclobenzaprine Other (See Comments)    Muscle Aches.  . Diclofenac Sodium Nausea And Vomiting  . Linzess [Linaclotide] Diarrhea  . Nitrofurantoin Monohyd Macro     Felt funny  . Prednisone Other (See  Comments)    migraine  . Tizanidine     Other reaction(s): Other (See Comments) insomnia  . Baclofen Nausea Only  . Meloxicam     Other reaction(s): Confusion  . Sulfamethoxazole Rash    Family History  Problem Relation Age of Onset  . Heart attack Father 28       Living  . Arthritis Father   . Skin cancer Father   . Hypertension Father   . Hyperlipidemia Father   . Leukemia Mother 29       Deceased  . Alzheimer's disease Paternal Aunt        X2  . Stomach cancer Paternal Aunt        x1  . Arthritis/Rheumatoid Paternal Aunt        x1  . Neuropathy Brother        Peripheal  . Fibromyalgia Sister   . HIV Sister   . Arthritis/Rheumatoid Sister   . Diabetes Paternal Grandmother     Social History   Social History  . Marital status: Married    Spouse name: N/A  . Number of children: 3  . Years of education: 2 yrs coll   Occupational History  . Disabled    Social History Main Topics  . Smoking status: Former Smoker    Packs/day: 1.00    Years: 20.00    Types: Cigarettes    Quit date: 02/29/2004  . Smokeless tobacco: Never Used  . Alcohol use 0.0 oz/week     Comment: "glass of wine q once in awhile; not very often; do it on special occasion"  . Drug use: No  . Sexual activity: No   Other Topics Concern  . None   Social History Narrative   Completed 2 years of college.    On disability.   Lives at home with her husband.   No caffeine use.   Left-handed.   Three children, 2 biological - 1 adopted.   Review of Systems - See HPI.  All other ROS are negative.  BP 140/82   Pulse 66   Temp 99.1 F (37.3 C) (Oral)   Resp 14   Ht _0  (1.626 m)   Wt 180 lb (81.6 kg)   SpO2 98%   BMI 30.90 kg/m   Physical Exam  Constitutional: She is well-developed, well-nourished, and in no distress.  HENT:  Head: Normocephalic and atraumatic.  Cardiovascular: Normal rate, regular rhythm, normal heart sounds and intact distal pulses.   Pulmonary/Chest: Effort  normal.  Musculoskeletal:       Left ankle: Tenderness. Medial malleolus tenderness found.  Left lower leg: Normal. She exhibits no tenderness, no swelling and no edema.  Neurological: She is alert.  Skin: Skin is warm and dry. No rash noted.  Psychiatric: Affect normal.  Vitals reviewed.   Recent Results (from the past 2160 hour(s))  CBC     Status: None   Collection Time: 09/01/16 11:24 AM  Result Value Ref Range   WBC 10.2 4.0 - 10.5 K/uL   RBC 4.72 3.87 - 5.11 MIL/uL   Hemoglobin 13.5 12.0 - 15.0 g/dL   HCT 40.3 36.0 - 46.0 %   MCV 85.4 78.0 - 100.0 fL   MCH 28.6 26.0 - 34.0 pg   MCHC 33.5 30.0 - 36.0 g/dL   RDW 14.0 11.5 - 15.5 %   Platelets 257 150 - 400 K/uL  Comprehensive metabolic panel     Status: Abnormal   Collection Time: 09/01/16 11:24 AM  Result Value Ref Range   Sodium 138 135 - 145 mmol/L   Potassium 3.9 3.5 - 5.1 mmol/L   Chloride 104 101 - 111 mmol/L   CO2 26 22 - 32 mmol/L   Glucose, Bld 149 (H) 65 - 99 mg/dL   BUN 15 6 - 20 mg/dL   Creatinine, Ser 0.61 0.44 - 1.00 mg/dL   Calcium 9.0 8.9 - 10.3 mg/dL   Total Protein 7.4 6.5 - 8.1 g/dL   Albumin 4.0 3.5 - 5.0 g/dL   AST 18 15 - 41 U/L   ALT 18 14 - 54 U/L   Alkaline Phosphatase 68 38 - 126 U/L   Total Bilirubin 0.8 0.3 - 1.2 mg/dL   GFR calc non Af Amer >60 >60 mL/min   GFR calc Af Amer >60 >60 mL/min    Comment: (NOTE) The eGFR has been calculated using the CKD EPI equation. This calculation has not been validated in all clinical situations. eGFR's persistently <60 mL/min signify possible Chronic Kidney Disease.    Anion gap 8 5 - 15  Troponin I     Status: None   Collection Time: 09/01/16 11:24 AM  Result Value Ref Range   Troponin I <0.03 <0.03 ng/mL  Troponin I     Status: None   Collection Time: 09/01/16  2:19 PM  Result Value Ref Range   Troponin I <0.03 <0.03 ng/mL  Hepatitis C antibody     Status: None   Collection Time: 10/25/16  1:50 PM  Result Value Ref Range   HCV Ab  NON-REACTIVE NON-REACTIVE    Comment:                                                                        This test is for screening purposes only.  Reactive results should be confirmed by an alternative method.  Suggest HCV Qualitative, PCR, test code 83130.  Specimens will be stable for reflex testing up to 3 days after collection.   TSH     Status: None   Collection Time: 11/18/16 10:38 AM  Result Value Ref Range   TSH 1.76 0.35 - 4.50 uIU/mL  Comp Met (CMET)     Status: None   Collection Time: 11/18/16 10:38 AM  Result Value Ref Range   Sodium 142 135 - 145 mEq/L  Potassium 4.1 3.5 - 5.1 mEq/L   Chloride 109 96 - 112 mEq/L   CO2 25 19 - 32 mEq/L   Glucose, Bld 97 70 - 99 mg/dL   BUN 18 6 - 23 mg/dL   Creatinine, Ser 0.76 0.40 - 1.20 mg/dL   Total Bilirubin 0.6 0.2 - 1.2 mg/dL   Alkaline Phosphatase 56 39 - 117 U/L   AST 24 0 - 37 U/L   ALT 22 0 - 35 U/L   Total Protein 6.4 6.0 - 8.3 g/dL   Albumin 4.3 3.5 - 5.2 g/dL   Calcium 10.0 8.4 - 10.5 mg/dL   GFR 82.61 >60.00 mL/min  CBC w/Diff     Status: None   Collection Time: 11/18/16 10:38 AM  Result Value Ref Range   WBC 8.1 4.0 - 10.5 K/uL   RBC 4.59 3.87 - 5.11 Mil/uL   Hemoglobin 13.0 12.0 - 15.0 g/dL   HCT 39.5 36.0 - 46.0 %   MCV 86.0 78.0 - 100.0 fl   MCHC 32.9 30.0 - 36.0 g/dL   RDW 15.1 11.5 - 15.5 %   Platelets 257.0 150.0 - 400.0 K/uL   Neutrophils Relative % 55.7 43.0 - 77.0 %   Lymphocytes Relative 32.5 12.0 - 46.0 %   Monocytes Relative 7.8 3.0 - 12.0 %   Eosinophils Relative 3.4 0.0 - 5.0 %   Basophils Relative 0.6 0.0 - 3.0 %   Neutro Abs 4.5 1.4 - 7.7 K/uL   Lymphs Abs 2.6 0.7 - 4.0 K/uL   Monocytes Absolute 0.6 0.1 - 1.0 K/uL   Eosinophils Absolute 0.3 0.0 - 0.7 K/uL   Basophils Absolute 0.0 0.0 - 0.1 K/uL    Assessment/Plan: 1. Swelling of lower extremity No edema on examination. Patient is highly concerned for a clot. Discussed muscular etiology giving medial malleoli tenderness and lack of  major swelling. Patient Is very anxious about clot despite reassurance given. Will obtain STAT US to rule out clot. Supportive measures including compression and OTC medications reviewed with patient. Will alter regimen according to [place] results.  - US Venous Img Lower Unilateral Left; Future   Leeanne Rio, PA-C

## 2016-11-22 NOTE — Progress Notes (Signed)
Pre visit review using our clinic review tool, if applicable. No additional management support is needed unless otherwise documented below in the visit note. 

## 2016-11-22 NOTE — Patient Instructions (Signed)
Please wear supportive foot wear. Please continue compression garment. Stay well-hydrated.  Limit salt intake.  Please stop by the front desk to speak with Levada Dy to schedule you Ultrasound. I will call you with your results.  If you develop any chest pain or shortness of breath, please go to the ER.

## 2016-11-23 ENCOUNTER — Encounter: Payer: Self-pay | Admitting: Physician Assistant

## 2016-11-28 DIAGNOSIS — M791 Myalgia, unspecified site: Secondary | ICD-10-CM | POA: Diagnosis not present

## 2016-12-05 ENCOUNTER — Encounter: Payer: Self-pay | Admitting: Physician Assistant

## 2016-12-07 ENCOUNTER — Encounter: Payer: Self-pay | Admitting: Physician Assistant

## 2016-12-08 ENCOUNTER — Encounter: Payer: Self-pay | Admitting: Physician Assistant

## 2016-12-08 ENCOUNTER — Ambulatory Visit (INDEPENDENT_AMBULATORY_CARE_PROVIDER_SITE_OTHER): Payer: 59 | Admitting: Physician Assistant

## 2016-12-08 ENCOUNTER — Encounter: Payer: Self-pay | Admitting: Neurology

## 2016-12-08 ENCOUNTER — Other Ambulatory Visit: Payer: Self-pay | Admitting: Physician Assistant

## 2016-12-08 ENCOUNTER — Ambulatory Visit (INDEPENDENT_AMBULATORY_CARE_PROVIDER_SITE_OTHER): Payer: 59

## 2016-12-08 ENCOUNTER — Ambulatory Visit (INDEPENDENT_AMBULATORY_CARE_PROVIDER_SITE_OTHER): Payer: 59 | Admitting: Neurology

## 2016-12-08 VITALS — BP 128/88 | HR 85 | Temp 98.7°F | Resp 14 | Ht 64.0 in | Wt 174.0 lb

## 2016-12-08 VITALS — BP 173/81 | HR 70 | Ht 64.0 in | Wt 175.5 lb

## 2016-12-08 DIAGNOSIS — M7989 Other specified soft tissue disorders: Secondary | ICD-10-CM | POA: Diagnosis not present

## 2016-12-08 DIAGNOSIS — M797 Fibromyalgia: Secondary | ICD-10-CM | POA: Diagnosis not present

## 2016-12-08 DIAGNOSIS — M25572 Pain in left ankle and joints of left foot: Secondary | ICD-10-CM

## 2016-12-08 DIAGNOSIS — M79672 Pain in left foot: Secondary | ICD-10-CM | POA: Diagnosis not present

## 2016-12-08 DIAGNOSIS — G43909 Migraine, unspecified, not intractable, without status migrainosus: Secondary | ICD-10-CM

## 2016-12-08 MED ORDER — OXYCODONE HCL 10 MG PO TABS: 10.0000 mg | ORAL_TABLET | Freq: Four times a day (QID) | ORAL | 0 refills | Status: DC | PRN

## 2016-12-08 MED ORDER — CELECOXIB 100 MG PO CAPS: 100.0000 mg | ORAL_CAPSULE | Freq: Two times a day (BID) | ORAL | 0 refills | Status: DC

## 2016-12-08 NOTE — Progress Notes (Signed)
Patient presents to clinic today c/o pain and swelling in L ankle over the past couple of weeks. Pain is constant and aching, sometimes more sharp with ambulation. Swelling is intermittent per patient. Denies known trauma or injury. Denies redness, warmth of joint. Has been taking her Gabapentin per usual without improvement in ankle symptoms. States she found an ASO brace at home and has been wearing since this morning.   Past Medical History:  Diagnosis Date  . Anxiety   . Asthma   . Cervicogenic headache 08/24/2016  . Chronic high back pain   . Double vision   . Fibromyalgia   . Heart murmur   . High cholesterol   . Hypertension   . Hypoglycemia   . Kidney stone   . Meniscus tear    Right  . Migraine 08/07/2015  . PTSD (post-traumatic stress disorder)   . Schizophrenia ([place])    treated by Dr. Casimiro Needle  . Seizures ([place])    last seizure 3 years ago, not on AED  . Stroke ([place]) 07/08/11   "I've had mini strokes before; left side of face  is more down than right "  . Syncope and collapse    One episode - 07/10/15    Current Outpatient Prescriptions on File Prior to Visit  Medication Sig Dispense Refill  . albuterol (PROVENTIL HFA;VENTOLIN HFA) 108 (90 Base) MCG/ACT inhaler Inhale 2 puffs into the lungs every 6 (six) hours as needed for wheezing or shortness of breath. 1 Inhaler 2  . aspirin EC 81 MG tablet Take 81 mg by mouth daily.    Marland Kitchen azelastine (ASTELIN) 0.1 % nasal spray Place 1 spray into both nostrils 2 (two) times daily. Use in each nostril as directed 30 mL 12  . diclofenac sodium (VOLTAREN) 1 % GEL Apply 2 g topically 4 (four) times daily. 100 g 0  . fluticasone (FLONASE) 50 MCG/ACT nasal spray USE TWO SPRAY(S) IN EACH NOSTRIL ONCE DAILY 16 g 5  . fluticasone-salmeterol (ADVAIR HFA) 115-21 MCG/ACT inhaler Inhale 1 puff into the lungs 2 (two) times daily. 12 g 5  . gabapentin (NEURONTIN) 600 MG tablet Take 1 tablet (600 mg total) by mouth 3 (three) times daily. 90 tablet 3    . ibuprofen (ADVIL,MOTRIN) 600 MG tablet Take 1 tablet (600 mg total) by mouth every 8 (eight) hours as needed. 30 tablet 0  . losartan (COZAAR) 25 MG tablet Take 1 tablet (25 mg total) by mouth 2 (two) times daily. 180 tablet 1  . metoprolol succinate (TOPROL-XL) 25 MG 24 hr tablet Take 3 tablets (75 mg total) by mouth daily. Take 3 tablets daily (35m ) 270 tablet 1  . Multiple Vitamin (THERA) TABS Take by mouth.    . Omega-3 Fatty Acids (FISH OIL) 1000 MG CAPS Take 1 capsule by mouth daily.    . polyethylene glycol powder (GLYCOLAX/MIRALAX) powder Take 17 g by mouth 2 (two) times daily as needed. 3350 g 1  . Probiotic Product (PROBIOTIC ADVANCED PO) Take 1 tablet by mouth daily.    . promethazine (PHENERGAN) 12.5 MG tablet Take 1 tablet (12.5 mg total) by mouth every 6 (six) hours as needed for nausea. 20 tablet 0  . ranitidine (ZANTAC) 150 MG tablet Take 150 mg by mouth at bedtime.    . rosuvastatin (CRESTOR) 20 MG tablet TAKE ONE TABLET BY MOUTH ONCE DAILY IN THE EVENING 90 tablet 1  . temazepam (RESTORIL) 15 MG capsule Take 2 capsules by mouth at bedtime.    .Marland Kitchen  zolpidem (AMBIEN) 10 MG tablet Take 2 tablets by mouth at bedtime.     . Oxycodone HCl 10 MG TABS Take 1 tablet (10 mg total) by mouth every 6 (six) hours as needed. 30 tablet 0   No current facility-administered medications on file prior to visit.     Allergies  Allergen Reactions  . Sulfa Antibiotics Hives  . Famciclovir Other (See Comments)  . Metronidazole Nausea And Vomiting  . Soma [Carisoprodol] Other (See Comments)    Moody and sleepy Cognitive Function, Daytime sleepiness  . Acyclovir And Related   . Benadryl [Diphenhydramine Hcl]     Severe emotional reaction and doesn't work well with Pt. Past history  . Cephalosporins Hives  . Codeine Other (See Comments)    Makes patient feel odd ; "sometimes mild; sometimes severe reaction; mostly severe"  . Cyclobenzaprine Other (See Comments)    Muscle Aches.  .  Diclofenac Sodium Nausea And Vomiting  . Linzess [Linaclotide] Diarrhea  . Nitrofurantoin Monohyd Macro     Felt funny  . Prednisone Other (See Comments)    migraine  . Tizanidine     Other reaction(s): Other (See Comments) insomnia  . Baclofen Nausea Only  . Meloxicam     Other reaction(s): Confusion  . Sulfamethoxazole Rash    Family History  Problem Relation Age of Onset  . Heart attack Father 76       Living  . Arthritis Father   . Skin cancer Father   . Hypertension Father   . Hyperlipidemia Father   . Leukemia Mother 37       Deceased  . Alzheimer's disease Paternal Aunt        X2  . Stomach cancer Paternal Aunt        x1  . Arthritis/Rheumatoid Paternal Aunt        x1  . Neuropathy Brother        Peripheal  . Fibromyalgia Sister   . HIV Sister   . Arthritis/Rheumatoid Sister   . Diabetes Paternal Grandmother     Social History   Social History  . Marital status: Married    Spouse name: N/A  . Number of children: 3  . Years of education: 2 yrs coll   Occupational History  . Disabled    Social History Main Topics  . Smoking status: Former Smoker    Packs/day: 1.00    Years: 20.00    Types: Cigarettes    Quit date: 02/29/2004  . Smokeless tobacco: Never Used  . Alcohol use 0.0 oz/week     Comment: "glass of wine q once in awhile; not very often; do it on special occasion"  . Drug use: No  . Sexual activity: No   Other Topics Concern  . None   Social History Narrative   Completed 2 years of college.    On disability.   Lives at home with her husband.   No caffeine use.   Left-handed.   Three children, 2 biological - 1 adopted.   Review of Systems - See HPI.  All other ROS are negative.  BP 128/88   Pulse 85   Temp 98.7 F (37.1 C) (Oral)   Resp 14   Ht _0  (1.626 m)   Wt 174 lb (78.9 kg)   SpO2 98%   BMI 29.87 kg/m   Physical Exam  Constitutional: She is oriented to person, place, and time and well-developed, well-nourished,  and in no distress.  HENT:  Head:  Normocephalic and atraumatic.  Eyes: Conjunctivae are normal.  Cardiovascular: Normal rate and regular rhythm.   Pulmonary/Chest: Effort normal and breath sounds normal. No respiratory distress. She has no wheezes. She has no rales. She exhibits no tenderness.  Musculoskeletal:       Left ankle: She exhibits swelling. She exhibits normal range of motion. Tenderness. Medial malleolus and AITFL tenderness found. No lateral malleolus tenderness found. Achilles tendon normal.  Neurological: She is alert and oriented to person, place, and time.  Skin: Skin is warm and dry. No rash noted.  Psychiatric: Affect normal.  Vitals reviewed.   Recent Results (from the past 2160 hour(s))  Hepatitis C antibody     Status: None   Collection Time: 10/25/16  1:50 PM  Result Value Ref Range   HCV Ab NON-REACTIVE NON-REACTIVE    Comment:                                                                        This test is for screening purposes only.  Reactive results should be confirmed by an alternative method.  Suggest HCV Qualitative, PCR, test code 83130.  Specimens will be stable for reflex testing up to 3 days after collection.   TSH     Status: None   Collection Time: 11/18/16 10:38 AM  Result Value Ref Range   TSH 1.76 0.35 - 4.50 uIU/mL  Comp Met (CMET)     Status: None   Collection Time: 11/18/16 10:38 AM  Result Value Ref Range   Sodium 142 135 - 145 mEq/L   Potassium 4.1 3.5 - 5.1 mEq/L   Chloride 109 96 - 112 mEq/L   CO2 25 19 - 32 mEq/L   Glucose, Bld 97 70 - 99 mg/dL   BUN 18 6 - 23 mg/dL   Creatinine, Ser 0.76 0.40 - 1.20 mg/dL   Total Bilirubin 0.6 0.2 - 1.2 mg/dL   Alkaline Phosphatase 56 39 - 117 U/L   AST 24 0 - 37 U/L   ALT 22 0 - 35 U/L   Total Protein 6.4 6.0 - 8.3 g/dL   Albumin 4.3 3.5 - 5.2 g/dL   Calcium 10.0 8.4 - 10.5 mg/dL   GFR 82.61 >60.00 mL/min  CBC w/Diff     Status: None   Collection Time: 11/18/16 10:38 AM  Result  Value Ref Range   WBC 8.1 4.0 - 10.5 K/uL   RBC 4.59 3.87 - 5.11 Mil/uL   Hemoglobin 13.0 12.0 - 15.0 g/dL   HCT 39.5 36.0 - 46.0 %   MCV 86.0 78.0 - 100.0 fl   MCHC 32.9 30.0 - 36.0 g/dL   RDW 15.1 11.5 - 15.5 %   Platelets 257.0 150.0 - 400.0 K/uL   Neutrophils Relative % 55.7 43.0 - 77.0 %   Lymphocytes Relative 32.5 12.0 - 46.0 %   Monocytes Relative 7.8 3.0 - 12.0 %   Eosinophils Relative 3.4 0.0 - 5.0 %   Basophils Relative 0.6 0.0 - 3.0 %   Neutro Abs 4.5 1.4 - 7.7 K/uL   Lymphs Abs 2.6 0.7 - 4.0 K/uL   Monocytes Absolute 0.6 0.1 - 1.0 K/uL   Eosinophils Absolute 0.3 0.0 - 0.7 K/uL   Basophils Absolute  0.0 0.0 - 0.1 K/uL    Assessment/Plan: 1. Acute left ankle pain No known trauma but at end of visit states she may have rolled it last week. Pain is out of proportion to exam. Will check imaging today to rule out fracture. Sprain suspected. Continue ASO bracing. RICE. Will attempt trial of Celebrex as Ibuprofen is not helping and patient with numerous medication intolerances which limit options.  - celecoxib (CELEBREX) 100 MG capsule; Take 1 capsule (100 mg total) by mouth 2 (two) times daily.  Dispense: 20 capsule; Refill: 0 - DG Ankle Complete Left; Future - DG Foot Complete Left; Future   Leeanne Rio, PA-C

## 2016-12-08 NOTE — Progress Notes (Signed)
Reason for visit: Fibromyalgia  Jasmine [name] is an 61 y.o. female  History of present illness:  Jasmine [name] is a 61 year old left-handed white female with a history of fibromyalgia that has responded quite well to gabapentin. The patient takes 600 mg 3 times daily, she tolerates medication well. She has developed severe pain and swelling of the left ankle that occurred about 5 weeks ago unassociated with any obvious injury. The patient has undergone venous Doppler studies that were unremarkable, she has been set up for x-rays of the foot and ankle but these have not yet been done. The patient is wearing a brace on the ankle and she is now using a walker to help keep weight off of the ankle. She has been placed on Celebrex that she has not yet started. She uses topical diclofenac ointment. The patient is able sleep fairly well on Ambien. She returns for an evaluation.  Past Medical History:  Diagnosis Date  . Anxiety   . Asthma   . Cervicogenic headache 08/24/2016  . Chronic high back pain   . Double vision   . Fibromyalgia   . Heart murmur   . High cholesterol   . Hypertension   . Hypoglycemia   . Kidney stone   . Meniscus tear    Right  . Migraine 08/07/2015  . PTSD (post-traumatic stress disorder)   . Schizophrenia ([place])    treated by Dr. Casimiro Needle  . Seizures ([place])    last seizure 3 years ago, not on AED  . Stroke ([place]) 07/08/11   "I've had mini strokes before; left side of face  is more down than right "  . Syncope and collapse    One episode - 07/10/15    Past Surgical History:  Procedure Laterality Date  . COCHLEAR IMPLANT  2010   left  . [place]   left  . KIDNEY STONE SURGERY  ~ 2006  . TONSILLECTOMY     "as a child"  . WISDOM TOOTH EXTRACTION      Family History  Problem Relation Age of Onset  . Heart attack Father 36       Living  . Arthritis Father   . Skin cancer Father   . Hypertension Father   . Hyperlipidemia Father   . Leukemia  Mother 68       Deceased  . Alzheimer's disease Paternal Aunt        X2  . Stomach cancer Paternal Aunt        x1  . Arthritis/Rheumatoid Paternal Aunt        x1  . Neuropathy Brother        Peripheal  . Fibromyalgia Sister   . HIV Sister   . Arthritis/Rheumatoid Sister   . Diabetes Paternal Grandmother     Social history:  reports that she quit smoking about 12 years ago. Her smoking use included Cigarettes. She has a 20.00 pack-year smoking history. She has never used smokeless tobacco. She reports that she drinks alcohol. She reports that she does not use drugs.    Allergies  Allergen Reactions  . Sulfa Antibiotics Hives  . Famciclovir Other (See Comments)  . Metronidazole Nausea And Vomiting  . Soma [Carisoprodol] Other (See Comments)    Moody and sleepy Cognitive Function, Daytime sleepiness  . Acyclovir And Related   . Benadryl [Diphenhydramine Hcl]     Severe emotional reaction and doesn't work well with Pt. Past history  .  Cephalosporins Hives  . Codeine Other (See Comments)    Makes patient feel odd ; "sometimes mild; sometimes severe reaction; mostly severe"  . Cyclobenzaprine Other (See Comments)    Muscle Aches.  . Diclofenac Sodium Nausea And Vomiting  . Linzess [Linaclotide] Diarrhea  . Nitrofurantoin Monohyd Macro     Felt funny  . Prednisone Other (See Comments)    migraine  . Tizanidine     Other reaction(s): Other (See Comments) insomnia  . Baclofen Nausea Only  . Meloxicam     Other reaction(s): Confusion  . Sulfamethoxazole Rash    Medications:  Prior to Admission medications   Medication Sig Start Date End Date Taking? Authorizing Provider  albuterol (PROVENTIL HFA;VENTOLIN HFA) 108 (90 Base) MCG/ACT inhaler Inhale 2 puffs into the lungs every 6 (six) hours as needed for wheezing or shortness of breath. 05/06/16  Yes Brunetta Jeans, PA-C  aspirin EC 81 MG tablet Take 81 mg by mouth daily.   Yes [provider]  azelastine (ASTELIN)  0.1 % nasal spray Place 1 spray into both nostrils 2 (two) times daily. Use in each nostril as directed 03/31/16  Yes Brunetta Jeans, PA-C  celecoxib (CELEBREX) 100 MG capsule Take 1 capsule (100 mg total) by mouth 2 (two) times daily. 12/08/16  Yes Brunetta Jeans, PA-C  diclofenac sodium (VOLTAREN) 1 % GEL Apply 2 g topically 4 (four) times daily. 11/18/16  Yes Brunetta Jeans, PA-C  fluticasone [facility]) 50 MCG/ACT nasal spray USE TWO SPRAY(S) IN EACH NOSTRIL ONCE DAILY 11/17/15  Yes Brunetta Jeans, PA-C  fluticasone-salmeterol (ADVAIR [facility]) 115-21 MCG/ACT inhaler Inhale 1 puff into the lungs 2 (two) times daily. 09/06/16  Yes Brunetta Jeans, PA-C  gabapentin (NEURONTIN) 600 MG tablet Take 1 tablet (600 mg total) by mouth 3 (three) times daily. 09/26/16  Yes Kathrynn Ducking, MD  ibuprofen (ADVIL,MOTRIN) 600 MG tablet Take 1 tablet (600 mg total) by mouth every 8 (eight) hours as needed. 08/12/16  Yes Brunetta Jeans, PA-C  losartan (COZAAR) 25 MG tablet Take 1 tablet (25 mg total) by mouth 2 (two) times daily. 11/03/16  Yes Brunetta Jeans, PA-C  metoprolol succinate (TOPROL-XL) 25 MG 24 hr tablet Take 3 tablets (75 mg total) by mouth daily. Take 3 tablets daily (75mg  ) 11/03/16  Yes Brunetta Jeans, PA-C  Multiple Vitamin (THERA) TABS Take by mouth.   Yes [provider]  Omega-3 Fatty Acids (FISH OIL) 1000 MG CAPS Take 1 capsule by mouth daily.   Yes [provider]  polyethylene glycol powder (GLYCOLAX/MIRALAX) powder Take 17 g by mouth 2 (two) times daily as needed. 11/18/16  Yes Brunetta Jeans, PA-C  Probiotic Product (PROBIOTIC ADVANCED PO) Take 1 tablet by mouth daily.   Yes [provider]  promethazine (PHENERGAN) 12.5 MG tablet Take 1 tablet (12.5 mg total) by mouth every 6 (six) hours as needed for nausea. 04/22/16  Yes Brunetta Jeans, PA-C  ranitidine (ZANTAC) 150 MG tablet Take 150 mg by mouth at bedtime.   Yes [provider]    rosuvastatin (CRESTOR) 20 MG tablet TAKE ONE TABLET BY MOUTH ONCE DAILY IN THE EVENING 08/12/16  Yes Brunetta Jeans, PA-C  temazepam (RESTORIL) 15 MG capsule Take 2 capsules by mouth at bedtime. 08/15/16  Yes [provider]  zolpidem (AMBIEN) 10 MG tablet Take 2 tablets by mouth at bedtime.  04/06/16  Yes [provider]    ROS:  Out of  a complete 14 system review of symptoms, the patient complains only of the following symptoms, and all other reviewed systems are negative.  Decreased activity Hearing loss Eye redness Restless legs, insomnia Constipation Heat intolerance Environmental allergies Joint pain, joint swelling, back pain, achy muscles, muscle cramps, walking difficulty Bruising easily Numbness  Blood pressure (!) 173/81, pulse 70, height 5\' 4"  (1.626 m), weight 175 lb 8 oz (79.6 kg).  Physical Exam  General: The patient is alert and cooperative at the time of the examination.  Skin: No significant peripheral edema is noted. The patient has a brace on the left ankle.   Neurologic Exam  Mental status: The patient is alert and oriented x 3 at the time of the examination. The patient has apparent normal recent and remote memory, with an apparently normal attention span and concentration ability.   Cranial nerves: Facial symmetry is present. Speech is normal, no aphasia or dysarthria is noted. Extraocular movements are full. Visual fields are full.  Motor: The patient has good strength in all 4 extremities.  Sensory examination: Soft touch sensation is symmetric on the face, arms, and legs.  Coordination: The patient has good finger-nose-finger and heel-to-shin bilaterally.  Gait and station: The patient has a limping gait on the left foot. She is walking with a walker.  Reflexes: Deep tendon reflexes are symmetric.   Assessment/Plan:  1. Fibromyalgia  2. Left ankle and foot pain  The patient is being evaluated for the left ankle and foot  pain, x-rays have been ordered through her primary care physician. She will be given a one-time prescription for oxycodone 10 mg tablets. She will follow-up through this office in 6 months, she has done well with her fibromyalgia discomfort on gabapentin 600 mg 3 times daily.  Jill Alexanders MD 12/08/2016 11:08 AM  [facility] [address] [place], [place] [address]  Phone [phone] Fax [phone]

## 2016-12-08 NOTE — Progress Notes (Signed)
Pre visit review using our clinic review tool, if applicable. No additional management support is needed unless otherwise documented below in the visit note. 

## 2016-12-09 ENCOUNTER — Encounter: Payer: Self-pay | Admitting: Physician Assistant

## 2016-12-13 ENCOUNTER — Encounter: Payer: Self-pay | Admitting: Physician Assistant

## 2016-12-13 ENCOUNTER — Encounter: Payer: Self-pay | Admitting: Neurology

## 2016-12-14 MED ORDER — IBUPROFEN 600 MG PO TABS: 600.0000 mg | ORAL_TABLET | Freq: Three times a day (TID) | ORAL | 0 refills | Status: DC | PRN

## 2016-12-25 ENCOUNTER — Encounter: Payer: Self-pay | Admitting: Physician Assistant

## 2016-12-26 ENCOUNTER — Encounter: Payer: Self-pay | Admitting: Physician Assistant

## 2016-12-26 ENCOUNTER — Other Ambulatory Visit: Payer: Self-pay | Admitting: Physician Assistant

## 2016-12-26 ENCOUNTER — Ambulatory Visit: Payer: 59 | Admitting: Neurology

## 2016-12-26 ENCOUNTER — Ambulatory Visit (INDEPENDENT_AMBULATORY_CARE_PROVIDER_SITE_OTHER): Payer: 59 | Admitting: Physician Assistant

## 2016-12-26 VITALS — BP 140/78 | HR 73 | Temp 97.9°F | Resp 14 | Ht 64.0 in | Wt 170.0 lb

## 2016-12-26 DIAGNOSIS — K529 Noninfective gastroenteritis and colitis, unspecified: Secondary | ICD-10-CM | POA: Diagnosis not present

## 2016-12-26 DIAGNOSIS — R110 Nausea: Secondary | ICD-10-CM

## 2016-12-26 LAB — COMPREHENSIVE METABOLIC PANEL
ALK PHOS: 64 U/L (ref 39–117)
ALT: 46 U/L — ABNORMAL HIGH (ref 0–35)
AST: 35 U/L (ref 0–37)
Albumin: 3.9 g/dL (ref 3.5–5.2)
BUN: 13 mg/dL (ref 6–23)
CO2: 27 mEq/L (ref 19–32)
Calcium: 9.1 mg/dL (ref 8.4–10.5)
Chloride: 107 mEq/L (ref 96–112)
Creatinine, Ser: 0.5 mg/dL (ref 0.40–1.20)
GFR: 133.89 mL/min (ref >60.00)
GLUCOSE: 96 mg/dL (ref 70–99)
POTASSIUM: 3.7 meq/L (ref 3.5–5.1)
SODIUM: 141 meq/L (ref 135–145)
TOTAL PROTEIN: 6 g/dL (ref 6.0–8.3)
Total Bilirubin: 0.4 mg/dL (ref 0.2–1.2)

## 2016-12-26 LAB — CBC WITH DIFFERENTIAL/PLATELET
BASOS PCT: 0.4 % (ref 0.0–3.0)
Basophils Absolute: 0 10*3/uL (ref 0.0–0.1)
EOS ABS: 0.2 10*3/uL (ref 0.0–0.7)
Eosinophils Relative: 2.3 % (ref 0.0–5.0)
HEMATOCRIT: 41.2 % (ref 36.0–46.0)
Hemoglobin: 13.5 g/dL (ref 12.0–15.0)
LYMPHS ABS: 2.3 10*3/uL (ref 0.7–4.0)
LYMPHS PCT: 34.1 % (ref 12.0–46.0)
MCHC: 32.7 g/dL (ref 30.0–36.0)
MCV: 86 fl (ref 78.0–100.0)
MONOS PCT: 8.2 % (ref 3.0–12.0)
Monocytes Absolute: 0.5 10*3/uL (ref 0.1–1.0)
NEUTROS ABS: 3.6 10*3/uL (ref 1.4–7.7)
NEUTROS PCT: 55 % (ref 43.0–77.0)
Platelets: 247 10*3/uL (ref 150.0–400.0)
RBC: 4.79 Mil/uL (ref 3.87–5.11)
RDW: 14.4 % (ref 11.5–15.5)
WBC: 6.6 10*3/uL (ref 4.0–10.5)

## 2016-12-26 MED ORDER — PANTOPRAZOLE SODIUM 40 MG PO TBEC: 40.0000 mg | DELAYED_RELEASE_TABLET | Freq: Every day | ORAL | 3 refills | Status: DC

## 2016-12-26 MED ORDER — PROMETHAZINE HCL 12.5 MG PO TABS: 12.5000 mg | ORAL_TABLET | Freq: Four times a day (QID) | ORAL | 0 refills | Status: DC | PRN

## 2016-12-26 NOTE — Progress Notes (Signed)
Patient presents to clinic today c/o 2 days of abdominal cramping, loose stools and nausea. States symptoms started Saturday. Noted cooking chicken at home the night before but no one else has symptoms. Denies fever. Notes some increased belching and bloating. Denies melena, hematochezia or tenesmus. Also notes increase in fibromyalgia pains. Has been able to tolerate fluids over the past 24 hours. Is not eating solids.  Past Medical History:  Diagnosis Date  . Anxiety   . Asthma   . Cervicogenic headache 08/24/2016  . Chronic high back pain   . Double vision   . Fibromyalgia   . Heart murmur   . High cholesterol   . Hypertension   . Hypoglycemia   . Kidney stone   . Meniscus tear    Right  . Migraine 08/07/2015  . PTSD (post-traumatic stress disorder)   . Schizophrenia ([place])    treated by Dr. Casimiro Needle  . Seizures ([place])    last seizure 3 years ago, not on AED  . Stroke ([place]) 07/08/11   "I've had mini strokes before; left side of face  is more down than right "  . Syncope and collapse    One episode - 07/10/15    Current Outpatient Prescriptions on File Prior to Visit  Medication Sig Dispense Refill  . albuterol (PROVENTIL HFA;VENTOLIN HFA) 108 (90 Base) MCG/ACT inhaler Inhale 2 puffs into the lungs every 6 (six) hours as needed for wheezing or shortness of breath. 1 Inhaler 2  . aspirin EC 81 MG tablet Take 81 mg by mouth daily.    Marland Kitchen azelastine (ASTELIN) 0.1 % nasal spray Place 1 spray into both nostrils 2 (two) times daily. Use in each nostril as directed 30 mL 12  . celecoxib (CELEBREX) 100 MG capsule Take 1 capsule (100 mg total) by mouth 2 (two) times daily. 20 capsule 0  . diclofenac sodium (VOLTAREN) 1 % GEL Apply 2 g topically 4 (four) times daily. 100 g 0  . fluticasone (FLONASE) 50 MCG/ACT nasal spray USE TWO SPRAY(S) IN EACH NOSTRIL ONCE DAILY 16 g 5  . fluticasone-salmeterol (ADVAIR HFA) 115-21 MCG/ACT inhaler Inhale 1 puff into the lungs 2 (two) times daily. 12 g 5  .  ibuprofen (ADVIL,MOTRIN) 600 MG tablet Take 1 tablet (600 mg total) by mouth every 8 (eight) hours as needed. 30 tablet 0  . losartan (COZAAR) 25 MG tablet Take 1 tablet (25 mg total) by mouth 2 (two) times daily. 180 tablet 1  . metoprolol succinate (TOPROL-XL) 25 MG 24 hr tablet Take 3 tablets (75 mg total) by mouth daily. Take 3 tablets daily (32m ) 270 tablet 1  . Multiple Vitamin (THERA) TABS Take by mouth.    . Omega-3 Fatty Acids (FISH OIL) 1000 MG CAPS Take 1 capsule by mouth daily.    . Oxycodone HCl 10 MG TABS Take 1 tablet (10 mg total) by mouth every 6 (six) hours as needed. 30 tablet 0  . polyethylene glycol powder (GLYCOLAX/MIRALAX) powder Take 17 g by mouth 2 (two) times daily as needed. 3350 g 1  . Probiotic Product (PROBIOTIC ADVANCED PO) Take 1 tablet by mouth daily.    . promethazine (PHENERGAN) 12.5 MG tablet Take 1 tablet (12.5 mg total) by mouth every 6 (six) hours as needed for nausea. 20 tablet 0  . ranitidine (ZANTAC) 150 MG tablet Take 150 mg by mouth at bedtime.    . rosuvastatin (CRESTOR) 20 MG tablet TAKE ONE TABLET BY MOUTH ONCE DAILY IN THE  EVENING 90 tablet 1  . temazepam (RESTORIL) 15 MG capsule Take 2 capsules by mouth at bedtime.    Marland Kitchen zolpidem (AMBIEN) 10 MG tablet Take 2 tablets by mouth at bedtime.     . gabapentin (NEURONTIN) 600 MG tablet Take 1 tablet (600 mg total) by mouth 3 (three) times daily. (Patient not taking: Reported on 12/26/2016) 90 tablet 3   No current facility-administered medications on file prior to visit.     Allergies  Allergen Reactions  . Sulfa Antibiotics Hives  . Famciclovir Other (See Comments)  . Metronidazole Nausea And Vomiting  . Soma [Carisoprodol] Other (See Comments)    Moody and sleepy Cognitive Function, Daytime sleepiness  . Acyclovir And Related   . Benadryl [Diphenhydramine Hcl]     Severe emotional reaction and doesn't work well with Pt. Past history  . Cephalosporins Hives  . Codeine Other (See Comments)     Makes patient feel odd ; "sometimes mild; sometimes severe reaction; mostly severe"  . Cyclobenzaprine Other (See Comments)    Muscle Aches.  . Diclofenac Sodium Nausea And Vomiting  . Linzess [Linaclotide] Diarrhea  . Nitrofurantoin Monohyd Macro     Felt funny  . Prednisone Other (See Comments)    migraine  . Tizanidine     Other reaction(s): Other (See Comments) insomnia  . Baclofen Nausea Only  . Meloxicam     Other reaction(s): Confusion  . Sulfamethoxazole Rash    Family History  Problem Relation Age of Onset  . Heart attack Father 45       Living  . Arthritis Father   . Skin cancer Father   . Hypertension Father   . Hyperlipidemia Father   . Leukemia Mother 41       Deceased  . Alzheimer's disease Paternal Aunt        X2  . Stomach cancer Paternal Aunt        x1  . Arthritis/Rheumatoid Paternal Aunt        x1  . Neuropathy Brother        Peripheal  . Fibromyalgia Sister   . HIV Sister   . Arthritis/Rheumatoid Sister   . Diabetes Paternal Grandmother     Social History   Social History  . Marital status: Married    Spouse name: N/A  . Number of children: 3  . Years of education: 2 yrs coll   Occupational History  . Disabled    Social History Main Topics  . Smoking status: Former Smoker    Packs/day: 1.00    Years: 20.00    Types: Cigarettes    Quit date: 02/29/2004  . Smokeless tobacco: Never Used  . Alcohol use 0.0 oz/week     Comment: "glass of wine q once in awhile; not very often; do it on special occasion"  . Drug use: No  . Sexual activity: No   Other Topics Concern  . None   Social History Narrative   Completed 2 years of college.    On disability.   Lives at home with her husband.   No caffeine use.   Left-handed.   Three children, 2 biological - 1 adopted.   Review of Systems - See HPI.  All other ROS are negative.  BP 140/78   Pulse 73   Temp 97.9 F (36.6 C) (Oral)   Resp 14   Ht _0  (1.626 m)   Wt 170 lb (77.1 kg)    SpO2 99%   BMI 29.18  kg/m   Physical Exam  Constitutional: She is oriented to person, place, and time and well-developed, well-nourished, and in no distress.  HENT:  Head: Normocephalic and atraumatic.  Eyes: Conjunctivae are normal.  Neck: Neck supple.  Cardiovascular: Normal rate, regular rhythm, normal heart sounds and intact distal pulses.   Pulmonary/Chest: Effort normal and breath sounds normal. No respiratory distress. She has no wheezes. She has no rales. She exhibits no tenderness.  Abdominal: Soft. Bowel sounds are normal. She exhibits no distension and no mass. There is tenderness in the epigastric area. There is no rebound and no guarding.  Neurological: She is alert and oriented to person, place, and time.  Skin: Skin is warm and dry. No rash noted.  Psychiatric: Affect normal.  Vitals reviewed.   Recent Results (from the past 2160 hour(s))  Hepatitis C antibody     Status: None   Collection Time: 10/25/16  1:50 PM  Result Value Ref Range   HCV Ab NON-REACTIVE NON-REACTIVE    Comment:                                                                        This test is for screening purposes only.  Reactive results should be confirmed by an alternative method.  Suggest HCV Qualitative, PCR, test code 83130.  Specimens will be stable for reflex testing up to 3 days after collection.   TSH     Status: None   Collection Time: 11/18/16 10:38 AM  Result Value Ref Range   TSH 1.76 0.35 - 4.50 uIU/mL  Comp Met (CMET)     Status: None   Collection Time: 11/18/16 10:38 AM  Result Value Ref Range   Sodium 142 135 - 145 mEq/L   Potassium 4.1 3.5 - 5.1 mEq/L   Chloride 109 96 - 112 mEq/L   CO2 25 19 - 32 mEq/L   Glucose, Bld 97 70 - 99 mg/dL   BUN 18 6 - 23 mg/dL   Creatinine, Ser 0.76 0.40 - 1.20 mg/dL   Total Bilirubin 0.6 0.2 - 1.2 mg/dL   Alkaline Phosphatase 56 39 - 117 U/L   AST 24 0 - 37 U/L   ALT 22 0 - 35 U/L   Total Protein 6.4 6.0 - 8.3 g/dL   Albumin 4.3  3.5 - 5.2 g/dL   Calcium 10.0 8.4 - 10.5 mg/dL   GFR 82.61 >60.00 mL/min  CBC w/Diff     Status: None   Collection Time: 11/18/16 10:38 AM  Result Value Ref Range   WBC 8.1 4.0 - 10.5 K/uL   RBC 4.59 3.87 - 5.11 Mil/uL   Hemoglobin 13.0 12.0 - 15.0 g/dL   HCT 39.5 36.0 - 46.0 %   MCV 86.0 78.0 - 100.0 fl   MCHC 32.9 30.0 - 36.0 g/dL   RDW 15.1 11.5 - 15.5 %   Platelets 257.0 150.0 - 400.0 K/uL   Neutrophils Relative % 55.7 43.0 - 77.0 %   Lymphocytes Relative 32.5 12.0 - 46.0 %   Monocytes Relative 7.8 3.0 - 12.0 %   Eosinophils Relative 3.4 0.0 - 5.0 %   Basophils Relative 0.6 0.0 - 3.0 %   Neutro Abs 4.5 1.4 - 7.7 K/uL  Lymphs Abs 2.6 0.7 - 4.0 K/uL   Monocytes Absolute 0.6 0.1 - 1.0 K/uL   Eosinophils Absolute 0.3 0.0 - 0.7 K/uL   Basophils Absolute 0.0 0.0 - 0.1 K/uL    Assessment/Plan: 1. Gastroenteritis Likely viral. Symptoms improving. Will check CBC w diff and CMP today. Add on Pantoprazole for reflux exacerbation. BRAT diet discussed. Fluids encouraged. Strict ER precautions given. - CBC w/Diff - Comp Met (CMET) - pantoprazole (PROTONIX) 40 MG tablet; Take 1 tablet (40 mg total) by mouth daily.  Dispense: 30 tablet; Refill: 3  2. Nausea Phenergan for nausea.  - promethazine (PHENERGAN) 12.5 MG tablet; Take 1 tablet (12.5 mg total) by mouth every 6 (six) hours as needed for nausea.  Dispense: 20 tablet; Refill: 0   Leeanne Rio, [place]

## 2016-12-26 NOTE — Progress Notes (Signed)
Pre visit review using our clinic review tool, if applicable. No additional management support is needed unless otherwise documented below in the visit note. 

## 2016-12-26 NOTE — Patient Instructions (Addendum)
Please go to the lab for blood work. I will call you with your results.  Your symptoms seems consistent with a mild gastroenteritis.  Stay well hydrated and get plenty of rest.  Start the diet below to help with loose stools.  Continue Immodium as directed.  Use the Protonix. Continue the Zantac.   Bland Diet A bland diet consists of foods that do not have a lot of fat or fiber. Foods without fat or fiber are easier for the body to digest. They are also less likely to irritate your mouth, throat, stomach, and other parts of your gastrointestinal tract. A bland diet is sometimes called a BRAT diet. What is my plan? Your health care provider or dietitian may recommend specific changes to your diet to prevent and treat your symptoms, such as:  Eating small meals often.  Cooking food until it is soft enough to chew easily.  Chewing your food well.  Drinking fluids slowly.  Not eating foods that are very spicy, sour, or fatty.  Not eating citrus fruits, such as oranges and grapefruit.  What do I need to know about this diet?  Eat a variety of foods from the bland diet food list.  Do not follow a bland diet longer than you have to.  Ask your health care provider whether you should take vitamins. What foods can I eat? Grains  Hot cereals, such as cream of wheat. Bread, crackers, or tortillas made from refined white flour. Rice. Vegetables Canned or cooked vegetables. Mashed or boiled potatoes. Fruits Bananas. Applesauce. Other types of cooked or canned fruit with the skin and seeds removed, such as canned peaches or pears. Meats and Other Protein Sources Scrambled eggs. Creamy peanut butter or other nut butters. Lean, well-cooked meats, such as chicken or fish. Tofu. Soups or broths. Dairy Low-fat dairy products, such as milk, cottage cheese, or yogurt. Beverages Water. Herbal tea. Apple juice. Sweets and Desserts Pudding. Custard. Fruit gelatin. Ice cream. Fats and  Oils Mild salad dressings. Canola or olive oil. The items listed above may not be a complete list of allowed foods or beverages. Contact your dietitian for more options. What foods are not recommended? Foods and ingredients that are often not recommended include:  Spicy foods, such as hot sauce or salsa.  Fried foods.  Sour foods, such as pickled or fermented foods.  Raw vegetables or fruits, especially citrus or berries.  Caffeinated drinks.  Alcohol.  Strongly flavored seasonings or condiments.  The items listed above may not be a complete list of foods and beverages that are not allowed. Contact your dietitian for more information. This information is not intended to replace advice given to you by your health care provider. Make sure you discuss any questions you have with your health care provider. Document Released: 06/08/2015 Document Revised: 07/23/2015 Document Reviewed: 02/26/2014 Elsevier Interactive Patient Education  2018 [employer].

## 2016-12-27 ENCOUNTER — Other Ambulatory Visit: Payer: Self-pay | Admitting: Physician Assistant

## 2016-12-27 MED ORDER — IBUPROFEN 600 MG PO TABS: 600.0000 mg | ORAL_TABLET | Freq: Three times a day (TID) | ORAL | 0 refills | Status: DC | PRN

## 2016-12-29 DIAGNOSIS — M791 Myalgia, unspecified site: Secondary | ICD-10-CM | POA: Diagnosis not present

## 2017-01-01 ENCOUNTER — Encounter (HOSPITAL_BASED_OUTPATIENT_CLINIC_OR_DEPARTMENT_OTHER): Payer: Self-pay | Admitting: *Deleted

## 2017-01-01 ENCOUNTER — Emergency Department (HOSPITAL_BASED_OUTPATIENT_CLINIC_OR_DEPARTMENT_OTHER)
Admission: EM | Admit: 2017-01-01 | Discharge: 2017-01-01 | Disposition: A | Discharge status: Home / Self Care | Payer: 59 | Attending: Emergency Medicine | Admitting: Emergency Medicine

## 2017-01-01 ENCOUNTER — Encounter: Payer: Self-pay | Admitting: Physician Assistant

## 2017-01-01 DIAGNOSIS — M797 Fibromyalgia: Secondary | ICD-10-CM

## 2017-01-01 DIAGNOSIS — F209 Schizophrenia, unspecified: Secondary | ICD-10-CM | POA: Insufficient documentation

## 2017-01-01 DIAGNOSIS — Z87891 Personal history of nicotine dependence: Secondary | ICD-10-CM | POA: Insufficient documentation

## 2017-01-01 DIAGNOSIS — J45909 Unspecified asthma, uncomplicated: Secondary | ICD-10-CM | POA: Insufficient documentation

## 2017-01-01 DIAGNOSIS — I10 Essential (primary) hypertension: Secondary | ICD-10-CM | POA: Diagnosis not present

## 2017-01-01 DIAGNOSIS — M5489 Other dorsalgia: Secondary | ICD-10-CM | POA: Diagnosis not present

## 2017-01-01 DIAGNOSIS — F410 Panic disorder [episodic paroxysmal anxiety] without agoraphobia: Secondary | ICD-10-CM | POA: Diagnosis not present

## 2017-01-01 DIAGNOSIS — Z79899 Other long term (current) drug therapy: Secondary | ICD-10-CM | POA: Insufficient documentation

## 2017-01-01 DIAGNOSIS — Z8673 Personal history of transient ischemic attack (TIA), and cerebral infarction without residual deficits: Secondary | ICD-10-CM | POA: Diagnosis not present

## 2017-01-01 DIAGNOSIS — R52 Pain, unspecified: Secondary | ICD-10-CM | POA: Diagnosis not present

## 2017-01-01 DIAGNOSIS — F430 Acute stress reaction: Secondary | ICD-10-CM | POA: Insufficient documentation

## 2017-01-01 LAB — CBC WITH DIFFERENTIAL/PLATELET
BASOS ABS: 0 10*3/uL (ref 0.0–0.1)
Basophils Relative: 0 %
EOS ABS: 0.4 10*3/uL (ref 0.0–0.7)
Eosinophils Relative: 3 %
HCT: 38.6 % (ref 36.0–46.0)
Hemoglobin: 12.5 g/dL (ref 12.0–15.0)
LYMPHS ABS: 6 10*3/uL — AB (ref 0.7–4.0)
Lymphocytes Relative: 43 %
MCH: 27.7 pg (ref 26.0–34.0)
MCHC: 32.4 g/dL (ref 30.0–36.0)
MCV: 85.6 fL (ref 78.0–100.0)
MONO ABS: 1 10*3/uL (ref 0.1–1.0)
Monocytes Relative: 7 %
NEUTROS ABS: 6.6 10*3/uL (ref 1.7–7.7)
Neutrophils Relative %: 47 %
PLATELETS: 360 10*3/uL (ref 150–400)
RBC: 4.51 MIL/uL (ref 3.87–5.11)
RDW: 14 % (ref 11.5–15.5)
WBC: 14 10*3/uL — ABNORMAL HIGH (ref 4.0–10.5)

## 2017-01-01 LAB — BASIC METABOLIC PANEL
Anion gap: 11 (ref 5–15)
BUN: 30 mg/dL — ABNORMAL HIGH (ref 6–20)
CALCIUM: 9.3 mg/dL (ref 8.9–10.3)
CO2: 20 mmol/L — ABNORMAL LOW (ref 22–32)
Chloride: 107 mmol/L (ref 101–111)
Creatinine, Ser: 0.85 mg/dL (ref 0.44–1.00)
GFR calc Af Amer: >60 mL/min (ref >60)
GLUCOSE: 100 mg/dL — AB (ref 65–99)
POTASSIUM: 3.4 mmol/L — AB (ref 3.5–5.1)
Sodium: 138 mmol/L (ref 135–145)

## 2017-01-01 LAB — RAPID URINE DRUG SCREEN, HOSP PERFORMED
Amphetamines: NOT DETECTED
BENZODIAZEPINES: POSITIVE — AB
Barbiturates: NOT DETECTED
COCAINE: NOT DETECTED
OPIATES: NOT DETECTED
Tetrahydrocannabinol: NOT DETECTED

## 2017-01-01 LAB — ETHANOL

## 2017-01-01 MED ORDER — LORAZEPAM 2 MG/ML IJ SOLN
2.0000 mg | Freq: Once | INTRAMUSCULAR | Status: AC
  Administered 2017-01-01: 2 mg via INTRAVENOUS
  Filled 2017-01-01: qty 1

## 2017-01-01 MED ORDER — HALOPERIDOL LACTATE 5 MG/ML IJ SOLN
2.5000 mg | Freq: Once | INTRAMUSCULAR | Status: DC
  Filled 2017-01-01: qty 1

## 2017-01-01 NOTE — ED Notes (Signed)
Pt brought to ED via EMS. Hx fibromyalgia and states that she has been hurting for about 6 hours. Pt hyperventilating on arrival and hands are hyperextended. Pt also c/o legs cramping. Instructed to take slow deep breaths. Refuses O2. Husband at bedside trying to calm pt as well. Pt is also HOH and has difficulty answering questions, but does respond appropriately when she hears [place].

## 2017-01-01 NOTE — ED Provider Notes (Addendum)
[facility] Provider Note: Jasmine Spurling, MD, FACEP  CSN: [id number] MRN: [id number] ARRIVAL: 01/01/17 at [place]  Generalized Body Aches  Level 5 caveat: Altered mental status HISTORY OF PRESENT ILLNESS  01/01/17 1:05 AM Jasmine [name] is a 60 y.o. female with a history of fibromyalgia.  She is on ibuprofen and gabapentin and took these yesterday per her routine.  Her husband called EMS because she was complaining of the worst fibromyalgia pain flare she had ever had onset yesterday evening.  She began yelling, hyperventilating and behaving erratically.  On arrival she continues to be agitated complaining of unbearable pain but also not interacting appropriately.  Part of this may be due to her hearing deficit and her hearing aids not being in place.   Past Medical History:  Diagnosis Date  . Anxiety   . Asthma   . Cervicogenic headache 08/24/2016  . Chronic high back pain   . Double vision   . Fibromyalgia   . Heart murmur   . High cholesterol   . Hypertension   . Hypoglycemia   . Kidney stone   . Meniscus tear    Right  . Migraine 08/07/2015  . PTSD (post-traumatic stress disorder)   . Schizophrenia ([place])    treated by Dr. Casimiro Needle  . Seizures ([place])    last seizure 3 years ago, not on AED  . Stroke ([place]) 07/08/11   "I've had mini strokes before; left side of face  is more down than right "  . Syncope and collapse    One episode - 07/10/15    Past Surgical History:  Procedure Laterality Date  . COCHLEAR IMPLANT  2010   left  . [place]   left  . KIDNEY STONE SURGERY  ~ 2006  . TONSILLECTOMY     "as a child"  . WISDOM TOOTH EXTRACTION      Family History  Problem Relation Age of Onset  . Heart attack Father 57       Living  . Arthritis Father   . Skin cancer Father   . Hypertension Father   . Hyperlipidemia Father   . Leukemia Mother 57       Deceased  . Alzheimer's disease Paternal Aunt       X2  . Stomach cancer Paternal Aunt        x1  . Arthritis/Rheumatoid Paternal Aunt        x1  . Neuropathy Brother        Peripheal  . Fibromyalgia Sister   . HIV Sister   . Arthritis/Rheumatoid Sister   . Diabetes Paternal Grandmother     Social History   Tobacco Use  . Smoking status: Former Smoker    Packs/day: 1.00    Years: 20.00    Pack years: 20.00    Types: Cigarettes    Last attempt to quit: 02/29/2004    Years since quitting: 12.8  . Smokeless tobacco: Never Used  Substance Use Topics  . Alcohol use: Yes    Alcohol/week: 0.0 oz    Comment: "glass of wine q once in awhile; not very often; do it on special occasion"  . Drug use: No    Prior to Admission medications   Medication Sig Start Date End Date Taking? Authorizing Provider  albuterol (PROVENTIL HFA;VENTOLIN HFA) 108 (90 Base) MCG/ACT inhaler Inhale 2 puffs into the lungs every 6 (six) hours as needed for wheezing  or shortness of breath. 05/06/16   Brunetta Jeans, PA-C  aspirin EC 81 MG tablet Take 81 mg by mouth daily.    [provider]  azelastine (ASTELIN) 0.1 % nasal spray Place 1 spray into both nostrils 2 (two) times daily. Use in each nostril as directed 03/31/16   Brunetta Jeans, PA-C  diclofenac sodium (VOLTAREN) 1 % GEL Apply 2 g topically 4 (four) times daily. 11/18/16   Brunetta Jeans, PA-C  fluticasone [facility]) 50 MCG/ACT nasal spray USE TWO SPRAY(S) IN [facility] NOSTRIL ONCE DAILY 11/17/15   Brunetta Jeans, PA-C  fluticasone-salmeterol (ADVAIR HFA) 115-21 MCG/ACT inhaler Inhale 1 puff into the lungs 2 (two) times daily. 09/06/16   Brunetta Jeans, PA-C  gabapentin (NEURONTIN) 600 MG tablet Take 1 tablet (600 mg total) by mouth 3 (three) times daily. Patient not taking: Reported on 12/26/2016 09/26/16   Kathrynn Ducking, MD  ibuprofen (ADVIL,MOTRIN) 600 MG tablet Take 1 tablet (600 mg total) by mouth every 8 (eight) hours as needed. 12/27/16   Brunetta Jeans, PA-C  losartan  (COZAAR) 25 MG tablet Take 1 tablet (25 mg total) by mouth 2 (two) times daily. 11/03/16   Brunetta Jeans, PA-C  metoprolol succinate (TOPROL-XL) 25 MG 24 hr tablet Take 3 tablets (75 mg total) by mouth daily. Take 3 tablets daily (75mg  ) 11/03/16   Brunetta Jeans, PA-C  Multiple Vitamin (THERA) TABS Take by mouth.    [provider]  Omega-3 Fatty Acids (FISH OIL) 1000 MG CAPS Take 1 capsule by mouth daily.    [provider]  Oxycodone HCl 10 MG TABS Take 1 tablet (10 mg total) by mouth every 6 (six) hours as needed. 12/08/16   Kathrynn Ducking, MD  pantoprazole (PROTONIX) 40 MG tablet Take 1 tablet (40 mg total) by mouth daily. 12/26/16   Brunetta Jeans, PA-C  polyethylene glycol powder (GLYCOLAX/MIRALAX) powder Take 17 g by mouth 2 (two) times daily as needed. 11/18/16   Brunetta Jeans, PA-C  Probiotic Product (PROBIOTIC ADVANCED PO) Take 1 tablet by mouth daily.    [provider]  promethazine (PHENERGAN) 12.5 MG tablet Take 1 tablet (12.5 mg total) by mouth every 6 (six) hours as needed for nausea. 12/26/16   Brunetta Jeans, PA-C  ranitidine (ZANTAC) 150 MG tablet Take 150 mg by mouth at bedtime.    [provider]  rosuvastatin (CRESTOR) 20 MG tablet TAKE ONE TABLET BY MOUTH ONCE DAILY IN THE EVENING 08/12/16   Brunetta Jeans, PA-C  temazepam (RESTORIL) 15 MG capsule Take 2 capsules by mouth at bedtime. 08/15/16   [provider]  zolpidem (AMBIEN) 10 MG tablet Take 2 tablets by mouth at bedtime.  04/06/16   [provider]    Allergies Sulfa antibiotics; Famciclovir; Metronidazole; Soma [carisoprodol]; Acyclovir and related; Benadryl [diphenhydramine hcl]; Cephalosporins; Codeine; Cyclobenzaprine; Diclofenac sodium; Linzess [linaclotide]; Nitrofurantoin monohyd macro; Prednisone; Tizanidine; Baclofen; Meloxicam; and Sulfamethoxazole   REVIEW OF SYSTEMS     PHYSICAL EXAMINATION  Initial Vital Signs Blood pressure (!)  172/102, pulse 100, resp. rate (!) 32, SpO2 99 %.  Examination General: Well-developed, well-nourished female in no acute distress; appearance consistent with age of record HENT: normocephalic; atraumatic Eyes: pupils equal, round and reactive to light; extraocular muscles intact Neck: supple Heart: regular rate and rhythm Lungs: clear to auscultation bilaterally Abdomen: soft; nondistended; nontender; bowel sounds present Extremities: No deformity; full range of motion; pulses normal Neurologic: Awake, alert;  motor function intact in all extremities and symmetric; no facial droop Skin: Warm and dry Psychiatric: Agitated; screaming; hyperventilating; inappropriate answers to questions possibly due to hearing deficit   RESULTS  Summary of this visit's results, reviewed by myself:   EKG Interpretation  Date/Time:    Ventricular Rate:    PR Interval:    QRS Duration:   QT Interval:    QTC Calculation:   R Axis:     Text Interpretation:        Laboratory Studies: Results for orders placed or performed during the hospital encounter of 01/01/17 (from the past 24 hour(s))  CBC with Differential/Platelet     Status: Abnormal   Collection Time: 01/01/17  1:25 AM  Result Value Ref Range   WBC 14.0 (H) 4.0 - 10.5 K/uL   RBC 4.51 3.87 - 5.11 MIL/uL   Hemoglobin 12.5 12.0 - 15.0 g/dL   HCT 38.6 36.0 - 46.0 %   MCV 85.6 78.0 - 100.0 fL   MCH 27.7 26.0 - 34.0 pg   MCHC 32.4 30.0 - 36.0 g/dL   RDW 14.0 11.5 - 15.5 %   Platelets 360 150 - 400 K/uL   Neutrophils Relative % 47 %   Lymphocytes Relative 43 %   Monocytes Relative 7 %   Eosinophils Relative 3 %   Basophils Relative 0 %   Neutro Abs 6.6 1.7 - 7.7 K/uL   Lymphs Abs 6.0 (H) 0.7 - 4.0 K/uL   Monocytes Absolute 1.0 0.1 - 1.0 K/uL   Eosinophils Absolute 0.4 0.0 - 0.7 K/uL   Basophils Absolute 0.0 0.0 - 0.1 K/uL   RBC Morphology STOMATOCYTES    WBC Morphology ATYPICAL LYMPHOCYTES    Smear Review PLATELET COUNT CONFIRMED  BY SMEAR   Basic metabolic panel     Status: Abnormal   Collection Time: 01/01/17  1:25 AM  Result Value Ref Range   Sodium 138 135 - 145 mmol/L   Potassium 3.4 (L) 3.5 - 5.1 mmol/L   Chloride 107 101 - 111 mmol/L   CO2 20 (L) 22 - 32 mmol/L   Glucose, Bld 100 (H) 65 - 99 mg/dL   BUN 30 (H) 6 - 20 mg/dL   Creatinine, Ser 0.85 0.44 - 1.00 mg/dL   Calcium 9.3 8.9 - 10.3 mg/dL   GFR calc non Af Amer >60 >60 mL/min   GFR calc Af Amer >60 >60 mL/min   Anion gap 11 5 - 15  Ethanol     Status: None   Collection Time: 01/01/17  1:26 AM  Result Value Ref Range   Alcohol, Ethyl (B) <10 <10 mg/dL  Rapid urine drug screen (hospital performed)     Status: Abnormal   Collection Time: 01/01/17  1:48 AM  Result Value Ref Range   Opiates NONE DETECTED NONE DETECTED   Cocaine NONE DETECTED NONE DETECTED   Benzodiazepines POSITIVE (A) NONE DETECTED   Amphetamines NONE DETECTED NONE DETECTED   Tetrahydrocannabinol NONE DETECTED NONE DETECTED   Barbiturates NONE DETECTED NONE DETECTED   Imaging Studies: No results found.  ED COURSE  Nursing notes and initial vitals signs, including pulse oximetry, reviewed.  Vitals:   01/01/17 0138 01/01/17 0333  BP: (!) 172/102 (!) 147/88  Pulse: 100   Resp: (!) 32 18  SpO2: 99% 97%   3:28 AM Patient now acting appropriately after 2 mg of Ativan IV.  She states her pain is at a manageable level and she does not wish any further treatment.  I suspect her symptomatology on arrival was due to an acute panic attack brought on by an exacerbation of her fibromyalgia.  The panic attack was relieved with Ativan.    PROCEDURES    ED DIAGNOSES     ICD-10-CM   1. Panic attack as reaction to stress F41.0    F43.0   2. Fibromyalgia M79.7        Jordell Outten, Jenny Reichmann, MD 01/01/17 [id number]    Shanon Rosser, MD 01/01/17 [phone]

## 2017-01-01 NOTE — ED Notes (Signed)
MD with pt  

## 2017-01-01 NOTE — ED Notes (Signed)
Report received 

## 2017-01-01 NOTE — ED Triage Notes (Signed)
Pt presents via [place] yelling in pain and tearful.  Pt is hoh.  She is hyperventilating  on arrival. Pt will not cooperate answering questions. Husband arrived and  is helping to obtain history. MD at bedside on arrival to ED.

## 2017-01-01 NOTE — ED Notes (Signed)
Pt's husband when home to get her some clothes prior to d/c

## 2017-01-02 ENCOUNTER — Encounter: Payer: Self-pay | Admitting: Physician Assistant

## 2017-01-03 ENCOUNTER — Encounter: Payer: Self-pay | Admitting: Physician Assistant

## 2017-01-03 ENCOUNTER — Telehealth: Payer: Self-pay

## 2017-01-03 NOTE — Telephone Encounter (Signed)
Noted  

## 2017-01-03 NOTE — Telephone Encounter (Signed)
Pt husband sent fax to [place] asking that pt ambien be sent to [facility].

## 2017-01-03 NOTE — Telephone Encounter (Signed)
Notified RN to call patient regarding message and commentary. No answer.   Please keep trying to reach patient. Per message it seems she is leaving husband but I wanted to verify with her message that she has not thought of harming herself.

## 2017-01-03 NOTE — Telephone Encounter (Signed)
Spoke with patient regarding MyChart messages, to determine if patient is at risk of harming herself. Patient states she is leaving the area since no one here cares about her. Denies plans to hurt herself, stating she "just needs to get away from here". [place]. Patient thanked me for checking on her and hung up.

## 2017-01-03 NOTE — Telephone Encounter (Signed)
LM requesting call back.  

## 2017-01-04 ENCOUNTER — Other Ambulatory Visit: Payer: Self-pay

## 2017-01-04 ENCOUNTER — Encounter: Payer: Self-pay | Admitting: Physician Assistant

## 2017-01-04 ENCOUNTER — Telehealth: Payer: Self-pay | Admitting: Physician Assistant

## 2017-01-04 ENCOUNTER — Ambulatory Visit (INDEPENDENT_AMBULATORY_CARE_PROVIDER_SITE_OTHER): Payer: 59 | Admitting: Physician Assistant

## 2017-01-04 VITALS — BP 160/90 | HR 70 | Temp 98.6°F | Resp 14 | Ht 64.0 in | Wt 170.0 lb

## 2017-01-04 DIAGNOSIS — M25572 Pain in left ankle and joints of left foot: Secondary | ICD-10-CM | POA: Diagnosis not present

## 2017-01-04 DIAGNOSIS — G8929 Other chronic pain: Secondary | ICD-10-CM | POA: Diagnosis not present

## 2017-01-04 LAB — SEDIMENTATION RATE: SED RATE: 12 mm/h (ref 0–30)

## 2017-01-04 LAB — URIC ACID: URIC ACID, SERUM: 4.1 mg/dL (ref 2.4–7.0)

## 2017-01-04 MED ORDER — HYDROCODONE-ACETAMINOPHEN 10-325 MG PO TABS: 1.0000 | ORAL_TABLET | Freq: Three times a day (TID) | ORAL | 0 refills | Status: DC | PRN

## 2017-01-04 NOTE — Progress Notes (Signed)
Patient presents to clinic today to discuss recurrence of ankle pain over the past few days. Endorses pain in left ankle and forefoot in the absence of injury. Was seen for this a month ago at which time x-ray was obtained and negative for fracture. Sprain suspected at that time. Patient notes over the past week pain has recurred and is intense. Notes swelling of forefoot and ankle. Denies swelling of leg. Notes severe pain in forefoot and ankle, 10/10, not alleviated with OTC medications. Was given Oxycodone by Dr. [name] but refused to take the medication. Patient was seen in the ER on 01/01/2017. Workup included UDS . Patient discharged with diagnosis of panic attack and flare-up of fibromyalgia..  Past Medical History:  Diagnosis Date  . Anxiety   . Asthma   . Cervicogenic headache 08/24/2016  . Chronic high back pain   . Double vision   . Fibromyalgia   . Heart murmur   . High cholesterol   . Hypertension   . Hypoglycemia   . Kidney stone   . Meniscus tear    Right  . Migraine 08/07/2015  . PTSD (post-traumatic stress disorder)   . Schizophrenia ([place])    treated by Dr. Casimiro Needle  . Seizures ([place])    last seizure 3 years ago, not on AED  . Stroke ([place]) 07/08/11   "I've had mini strokes before; left side of face  is more down than right "  . Syncope and collapse    One episode - 07/10/15    Current Outpatient Medications on File Prior to Visit  Medication Sig Dispense Refill  . albuterol (PROVENTIL HFA;VENTOLIN HFA) 108 (90 Base) MCG/ACT inhaler Inhale 2 puffs into the lungs every 6 (six) hours as needed for wheezing or shortness of breath. 1 Inhaler 2  . aspirin EC 81 MG tablet Take 81 mg by mouth daily.    Marland Kitchen azelastine (ASTELIN) 0.1 % nasal spray Place 1 spray into both nostrils 2 (two) times daily. Use in each nostril as directed 30 mL 12  . diclofenac sodium (VOLTAREN) 1 % GEL Apply 2 g topically 4 (four) times daily. 100 g 0  . fluticasone (FLONASE) 50 MCG/ACT nasal spray USE  TWO SPRAY(S) IN EACH NOSTRIL ONCE DAILY 16 g 5  . fluticasone-salmeterol (ADVAIR HFA) 115-21 MCG/ACT inhaler Inhale 1 puff into the lungs 2 (two) times daily. 12 g 5  . gabapentin (NEURONTIN) 600 MG tablet Take 1 tablet (600 mg total) by mouth 3 (three) times daily. 90 tablet 3  . ibuprofen (ADVIL,MOTRIN) 600 MG tablet Take 1 tablet (600 mg total) by mouth every 8 (eight) hours as needed. 90 tablet 0  . losartan (COZAAR) 25 MG tablet Take 1 tablet (25 mg total) by mouth 2 (two) times daily. 180 tablet 1  . metoprolol succinate (TOPROL-XL) 25 MG 24 hr tablet Take 3 tablets (75 mg total) by mouth daily. Take 3 tablets daily ('75mg'$  ) 270 tablet 1  . Multiple Vitamin (THERA) TABS Take by mouth.    . Omega-3 Fatty Acids (FISH OIL) 1000 MG CAPS Take 1 capsule by mouth daily.    . pantoprazole (PROTONIX) 40 MG tablet Take 1 tablet (40 mg total) by mouth daily. 30 tablet 3  . polyethylene glycol powder (GLYCOLAX/MIRALAX) powder Take 17 g by mouth 2 (two) times daily as needed. 3350 g 1  . Probiotic Product (PROBIOTIC ADVANCED PO) Take 1 tablet by mouth daily.    . promethazine (PHENERGAN) 12.5 MG tablet Take 1 tablet (  12.5 mg total) by mouth every 6 (six) hours as needed for nausea. 20 tablet 0  . ranitidine (ZANTAC) 150 MG tablet Take 150 mg by mouth at bedtime.    . rosuvastatin (CRESTOR) 20 MG tablet TAKE ONE TABLET BY MOUTH ONCE DAILY IN THE EVENING 90 tablet 1  . temazepam (RESTORIL) 15 MG capsule Take 2 capsules by mouth at bedtime.    Marland Kitchen zolpidem (AMBIEN) 10 MG tablet Take 2 tablets by mouth at bedtime.      No current facility-administered medications on file prior to visit.     Allergies  Allergen Reactions  . Sulfa Antibiotics Hives  . Famciclovir Other (See Comments)  . Metronidazole Nausea And Vomiting  . Soma [Carisoprodol] Other (See Comments)    Moody and sleepy Cognitive Function, Daytime sleepiness  . Acyclovir And Related   . Benadryl [Diphenhydramine Hcl]     Severe emotional  reaction and doesn't work well with Pt. Past history  . Cephalosporins Hives  . Codeine Other (See Comments)    Makes patient feel odd ; "sometimes mild; sometimes severe reaction; mostly severe"  . Cyclobenzaprine Other (See Comments)    Muscle Aches.  . Diclofenac Sodium Nausea And Vomiting  . Linzess [Linaclotide] Diarrhea  . Nitrofurantoin Monohyd Macro     Felt funny  . Prednisone Other (See Comments)    migraine  . Tizanidine     Other reaction(s): Other (See Comments) insomnia  . Baclofen Nausea Only  . Meloxicam     Other reaction(s): Confusion  . Sulfamethoxazole Rash    Family History  Problem Relation Age of Onset  . Heart attack Father 27       Living  . Arthritis Father   . Skin cancer Father   . Hypertension Father   . Hyperlipidemia Father   . Leukemia Mother 8       Deceased  . Alzheimer's disease Paternal Aunt        X2  . Stomach cancer Paternal Aunt        x1  . Arthritis/Rheumatoid Paternal Aunt        x1  . Neuropathy Brother        Peripheal  . Fibromyalgia Sister   . HIV Sister   . Arthritis/Rheumatoid Sister   . Diabetes Paternal Grandmother     Social History   Socioeconomic History  . Marital status: Married    Spouse name: None  . Number of children: 3  . Years of education: 2 yrs coll  . Highest education level: None  Social Needs  . Financial resource strain: None  . Food insecurity - worry: None  . Food insecurity - inability: None  . Transportation needs - medical: None  . Transportation needs - non-medical: None  Occupational History  . Occupation: Disabled  Tobacco Use  . Smoking status: Former Smoker    Packs/day: 1.00    Years: 20.00    Pack years: 20.00    Types: Cigarettes    Last attempt to quit: 02/29/2004    Years since quitting: 12.8  . Smokeless tobacco: Never Used  Substance and Sexual Activity  . Alcohol use: Yes    Alcohol/week: 0.0 oz    Comment: "glass of wine q once in awhile; not very often; do it  on special occasion"  . Drug use: No  . Sexual activity: No  Other Topics Concern  . None  Social History Narrative   Completed 2 years of college.    On  disability.   Lives at home with her husband.   No caffeine use.   Left-handed.   Three children, 2 biological - 1 adopted.   Review of Systems - See HPI.  All other ROS are negative.  BP (!) 160/90   Pulse 70   Temp 98.6 F (37 C) (Oral)   Resp 14   Ht 5' 4"  (1.626 m)   Wt 170 lb (77.1 kg)   SpO2 99%   BMI 29.18 kg/m   Physical Exam  Constitutional: She is well-developed, well-nourished, and in no distress.  HENT:  Head: Normocephalic and atraumatic.  Eyes: Conjunctivae are normal.  Cardiovascular: Normal rate, regular rhythm, normal heart sounds and intact distal pulses.  Musculoskeletal:       Left ankle: She exhibits decreased range of motion (secondary to pain. ) and swelling (of lateral forefoot andlateral ankle. ). Tenderness (Patinet reaction out of proportion to any examination). Lateral malleolus, medial malleolus and AITFL tenderness found.  No calf swelling on examination. ROM of knee within normal limits.  Neurological: She is alert.  Vitals reviewed.  Recent Results (from the past 2160 hour(s))  Hepatitis C antibody     Status: None   Collection Time: 10/25/16  1:50 PM  Result Value Ref Range   HCV Ab NON-REACTIVE NON-REACTIVE    Comment:                                                                        This test is for screening purposes only.  Reactive results should be confirmed by an alternative method.  Suggest HCV Qualitative, PCR, test code 83130.  Specimens will be stable for reflex testing up to 3 days after collection.   TSH     Status: None   Collection Time: 11/18/16 10:38 AM  Result Value Ref Range   TSH 1.76 0.35 - 4.50 uIU/mL  Comp Met (CMET)     Status: None   Collection Time: 11/18/16 10:38 AM  Result Value Ref Range   Sodium 142 135 - 145 mEq/L   Potassium 4.1 3.5 - 5.1  mEq/L   Chloride 109 96 - 112 mEq/L   CO2 25 19 - 32 mEq/L   Glucose, Bld 97 70 - 99 mg/dL   BUN 18 6 - 23 mg/dL   Creatinine, Ser 0.76 0.40 - 1.20 mg/dL   Total Bilirubin 0.6 0.2 - 1.2 mg/dL   Alkaline Phosphatase 56 39 - 117 U/L   AST 24 0 - 37 U/L   ALT 22 0 - 35 U/L   Total Protein 6.4 6.0 - 8.3 g/dL   Albumin 4.3 3.5 - 5.2 g/dL   Calcium 10.0 8.4 - 10.5 mg/dL   GFR 82.61 >60.00 mL/min  CBC w/Diff     Status: None   Collection Time: 11/18/16 10:38 AM  Result Value Ref Range   WBC 8.1 4.0 - 10.5 K/uL   RBC 4.59 3.87 - 5.11 Mil/uL   Hemoglobin 13.0 12.0 - 15.0 g/dL   HCT 39.5 36.0 - 46.0 %   MCV 86.0 78.0 - 100.0 fl   MCHC 32.9 30.0 - 36.0 g/dL   RDW 15.1 11.5 - 15.5 %   Platelets 257.0 150.0 - 400.0 K/uL  Neutrophils Relative % 55.7 43.0 - 77.0 %   Lymphocytes Relative 32.5 12.0 - 46.0 %   Monocytes Relative 7.8 3.0 - 12.0 %   Eosinophils Relative 3.4 0.0 - 5.0 %   Basophils Relative 0.6 0.0 - 3.0 %   Neutro Abs 4.5 1.4 - 7.7 K/uL   Lymphs Abs 2.6 0.7 - 4.0 K/uL   Monocytes Absolute 0.6 0.1 - 1.0 K/uL   Eosinophils Absolute 0.3 0.0 - 0.7 K/uL   Basophils Absolute 0.0 0.0 - 0.1 K/uL  CBC w/Diff     Status: None   Collection Time: 12/26/16 11:11 AM  Result Value Ref Range   WBC 6.6 4.0 - 10.5 K/uL   RBC 4.79 3.87 - 5.11 Mil/uL   Hemoglobin 13.5 12.0 - 15.0 g/dL   HCT 41.2 36.0 - 46.0 %   MCV 86.0 78.0 - 100.0 fl   MCHC 32.7 30.0 - 36.0 g/dL   RDW 14.4 11.5 - 15.5 %   Platelets 247.0 150.0 - 400.0 K/uL   Neutrophils Relative % 55.0 43.0 - 77.0 %   Lymphocytes Relative 34.1 12.0 - 46.0 %   Monocytes Relative 8.2 3.0 - 12.0 %   Eosinophils Relative 2.3 0.0 - 5.0 %   Basophils Relative 0.4 0.0 - 3.0 %   Neutro Abs 3.6 1.4 - 7.7 K/uL   Lymphs Abs 2.3 0.7 - 4.0 K/uL   Monocytes Absolute 0.5 0.1 - 1.0 K/uL   Eosinophils Absolute 0.2 0.0 - 0.7 K/uL   Basophils Absolute 0.0 0.0 - 0.1 K/uL  Comp Met (CMET)     Status: Abnormal   Collection Time: 12/26/16 11:11 AM    Result Value Ref Range   Sodium 141 135 - 145 mEq/L   Potassium 3.7 3.5 - 5.1 mEq/L   Chloride 107 96 - 112 mEq/L   CO2 27 19 - 32 mEq/L   Glucose, Bld 96 70 - 99 mg/dL   BUN 13 6 - 23 mg/dL   Creatinine, Ser 0.50 0.40 - 1.20 mg/dL   Total Bilirubin 0.4 0.2 - 1.2 mg/dL   Alkaline Phosphatase 64 39 - 117 U/L   AST 35 0 - 37 U/L   ALT 46 (H) 0 - 35 U/L   Total Protein 6.0 6.0 - 8.3 g/dL   Albumin 3.9 3.5 - 5.2 g/dL   Calcium 9.1 8.4 - 10.5 mg/dL   GFR 133.89 >60.00 mL/min  CBC with Differential/Platelet     Status: Abnormal   Collection Time: 01/01/17  1:25 AM  Result Value Ref Range   WBC 14.0 (H) 4.0 - 10.5 K/uL   RBC 4.51 3.87 - 5.11 MIL/uL   Hemoglobin 12.5 12.0 - 15.0 g/dL   HCT 38.6 36.0 - 46.0 %   MCV 85.6 78.0 - 100.0 fL   MCH 27.7 26.0 - 34.0 pg   MCHC 32.4 30.0 - 36.0 g/dL   RDW 14.0 11.5 - 15.5 %   Platelets 360 150 - 400 K/uL   Neutrophils Relative % 47 %   Lymphocytes Relative 43 %   Monocytes Relative 7 %   Eosinophils Relative 3 %   Basophils Relative 0 %   Neutro Abs 6.6 1.7 - 7.7 K/uL   Lymphs Abs 6.0 (H) 0.7 - 4.0 K/uL   Monocytes Absolute 1.0 0.1 - 1.0 K/uL   Eosinophils Absolute 0.4 0.0 - 0.7 K/uL   Basophils Absolute 0.0 0.0 - 0.1 K/uL   RBC Morphology STOMATOCYTES     Comment: ELLIPTOCYTES   WBC  Morphology ATYPICAL LYMPHOCYTES     Comment: WHITE COUNT CONFIRMED ON SMEAR   Smear Review PLATELET COUNT CONFIRMED BY SMEAR   Basic metabolic panel     Status: Abnormal   Collection Time: 01/01/17  1:25 AM  Result Value Ref Range   Sodium 138 135 - 145 mmol/L   Potassium 3.4 (L) 3.5 - 5.1 mmol/L   Chloride 107 101 - 111 mmol/L   CO2 20 (L) 22 - 32 mmol/L   Glucose, Bld 100 (H) 65 - 99 mg/dL   BUN 30 (H) 6 - 20 mg/dL   Creatinine, Ser 0.85 0.44 - 1.00 mg/dL   Calcium 9.3 8.9 - 10.3 mg/dL   GFR calc non Af Amer >60 >60 mL/min   GFR calc Af Amer >60 >60 mL/min    Comment: (NOTE) The eGFR has been calculated using the CKD EPI equation. This  calculation has not been validated in all clinical situations. eGFR's persistently <60 mL/min signify possible Chronic Kidney Disease.    Anion gap 11 5 - 15  Ethanol     Status: None   Collection Time: 01/01/17  1:26 AM  Result Value Ref Range   Alcohol, Ethyl (B) <10 <10 mg/dL    Comment:        LOWEST DETECTABLE LIMIT FOR SERUM ALCOHOL IS 10 mg/dL FOR MEDICAL PURPOSES ONLY   Rapid urine drug screen (hospital performed)     Status: Abnormal   Collection Time: 01/01/17  1:48 AM  Result Value Ref Range   Opiates NONE DETECTED NONE DETECTED   Cocaine NONE DETECTED NONE DETECTED   Benzodiazepines POSITIVE (A) NONE DETECTED   Amphetamines NONE DETECTED NONE DETECTED   Tetrahydrocannabinol NONE DETECTED NONE DETECTED   Barbiturates NONE DETECTED NONE DETECTED    Comment:        DRUG SCREEN FOR MEDICAL PURPOSES ONLY.  IF CONFIRMATION IS NEEDED FOR ANY PURPOSE, NOTIFY LAB WITHIN 5 DAYS.        LOWEST DETECTABLE LIMITS FOR URINE DRUG SCREEN Drug Class       Cutoff (ng/mL) Amphetamine      1000 Barbiturate      200 Benzodiazepine   737 Tricyclics       106 Opiates          300 Cocaine          300 THC              50     Assessment/Plan: 1. Chronic pain of left ankle Patient very upset with ER treatment as has been noted in multiple messages from patient over the past few days. Has also showed disrespectful behavior to staff and this provider on multiple occasions. Discussed with her that we are trying to help her and that inappropriate behavior towards staff will not be tolerated.   Will check labs today to assess for signs of gout or other inflammatory cause of symptoms. Patient adamant on MRI. Giving severity of pain and negative x-ray, we feel this is reasonable. MRI ordered. Brace applied. Medications discussed in refill. She notes that she does not have an allergy to codeine and is not sure why this is noted in her chart. Cannot tolerate most NSAIDs. Unable to tolerate  prednisone. OTC medications subtherapeutic. Tramadol subtherapeutic per patient. Will start trial of Hydrocodone for pain relief. OTC medications reviewed. She is followed by Orthopedics. We may need to get her back in with them after imaging is complete. - MR FOOT & ANKLE LEFT WO CONTRAST;  Future - Sedimentation rate - Uric acid   Leeanne Rio, PA-C

## 2017-01-04 NOTE — Telephone Encounter (Signed)
Patient dismissed from [facility] by Raiford Noble PA-C , effective January 03, 2017. Dismissal letter sent out by certified / registered mail.  daj

## 2017-01-04 NOTE — Patient Instructions (Addendum)
Please go to the lab today for blood work.  I will call you with your results. We will alter treatment regimen(s) if indicated by your results.   Please keep your phone on.  Our office will call you to schedule MRI once insurance approves.  Please take the hydrocodone as directed, taking 1/2 tablet on first dose to make sure you tolerate.  Continue the Gabapentin as directed.

## 2017-01-04 NOTE — Progress Notes (Signed)
Pre visit review using our clinic review tool, if applicable. No additional management support is needed unless otherwise documented below in the visit note. 

## 2017-01-05 ENCOUNTER — Encounter: Payer: Self-pay | Admitting: Physician Assistant

## 2017-01-05 DIAGNOSIS — M25572 Pain in left ankle and joints of left foot: Secondary | ICD-10-CM

## 2017-01-06 ENCOUNTER — Encounter: Payer: Self-pay | Admitting: Physician Assistant

## 2017-01-06 DIAGNOSIS — G8929 Other chronic pain: Secondary | ICD-10-CM | POA: Diagnosis not present

## 2017-01-06 DIAGNOSIS — M25472 Effusion, left ankle: Secondary | ICD-10-CM | POA: Diagnosis not present

## 2017-01-06 DIAGNOSIS — M25572 Pain in left ankle and joints of left foot: Secondary | ICD-10-CM | POA: Diagnosis not present

## 2017-01-06 DIAGNOSIS — K6289 Other specified diseases of anus and rectum: Secondary | ICD-10-CM | POA: Diagnosis not present

## 2017-01-06 DIAGNOSIS — R600 Localized edema: Secondary | ICD-10-CM | POA: Diagnosis not present

## 2017-01-06 DIAGNOSIS — M722 Plantar fascial fibromatosis: Secondary | ICD-10-CM | POA: Diagnosis not present

## 2017-01-07 NOTE — Telephone Encounter (Signed)
Please see MyChart message. Patient has already been dismissed for rude behavior but likely has not gotten her letter. I called both [facility] and [facility] to see that she had completed her MRI and to get results since it is Saturday and I did not receive any results yesterday via fax or today via EMR. They noted at [place] that she was to have MRI done today but cancelled her appointment so I am not sure what she is referring to as per her message it seemed she was upset for not receiving her results.   I have gone above an beyond to try to help her with her issues even despite her rudeness to staff as I was trying to be understanding of both mental and physical health issues.   Please inform patient of this and deactivate her MyChart.

## 2017-01-09 ENCOUNTER — Telehealth: Payer: Self-pay | Admitting: Physician Assistant

## 2017-01-09 NOTE — Telephone Encounter (Signed)
Please call patient to discuss imaging results. I just received her results via fax this morning.   This weekend I had called both [facility] (where order initially placed) and [facility] (where she had had imaging done previously). [facility] only noted that she had the MRI scheduled this Saturday but it had been canceled. As such I was not aware that imaging was performed last week. I am not sure why if she had done on Friday morning that I would just be getting results over the weekend via fax. My apologies to her. [facility] did note when I called them that she had an appointment scheduled this Tuesday with one of their providers.   MRI is negative for fracture which is great news. However, it shows edema in certain joint spaces concerning for a subtalar injury as well as chronic achilles tendinosis and tendinitis as well as chronic plantar fasciitis. Is her appointment Tuesday with a foot specialist at [place]? If not, I want to set this up for her ASAP to see if she can be seen today.

## 2017-01-09 NOTE — Telephone Encounter (Signed)
Please see phone note 01/09/17 for updated information.

## 2017-01-09 NOTE — Telephone Encounter (Signed)
LM requesting call back. Will continue to attempt to reach patient.

## 2017-01-10 NOTE — Telephone Encounter (Signed)
Attempted to reach out to patient again to discuss notes below.   Left message to call back.

## 2017-01-10 NOTE — Telephone Encounter (Signed)
Can we please try to reach patient again to discuss MRI result and need for specialist assessment? Please see entirety of patient call documentation from 01/09/17

## 2017-01-10 NOTE — Telephone Encounter (Signed)
At the request of PCP, I have reached out to patient asking that she call regarding results.

## 2017-01-10 NOTE — Telephone Encounter (Signed)
Not much we can do if she will not respond to MyChart messages or phone calls. We can keep trying. I will work on a letter to mail to her as well.

## 2017-01-11 NOTE — Telephone Encounter (Signed)
Attempted to reach patient, VM box full, unable to leave message.

## 2017-01-12 ENCOUNTER — Encounter: Payer: Self-pay | Admitting: Physician Assistant

## 2017-01-13 ENCOUNTER — Telehealth: Payer: Self-pay | Admitting: *Deleted

## 2017-01-13 ENCOUNTER — Telehealth: Payer: Self-pay | Admitting: Physician Assistant

## 2017-01-13 NOTE — Telephone Encounter (Signed)
I called patient and she was upset that I called and not [place].  I explained that [name] was in clinic which is why I was making the phone call for him.  She said "no! I will talk to him"  I again explained that he would not be able to talk with her personally but that I could take her questions and get answers for her.   She replied "NO! I guess he's just not going to call me and apologize so I will not speak to you!" and she hung up.  Copied from [place] [phone]. Topic: General - Other >> Jan 13, 2017  7:30 AM Aurelio Brash B wrote: Reason for CRM: Pt called asking to speak with Dr Hassell Done, pt said he knew what it was about and would not give me any further details

## 2017-01-13 NOTE — Telephone Encounter (Signed)
Called patient and tried to speak with her. She stated that she locked herself out of her MyChart. I advised her that her MRI results were mailed to her yesterday. She cut me off and said that she does not answer the phone when she is in shooting pain, said thank you and hung up.   Patient has been dismissed from [facility] and will only be seen if she has an urgent/acute need through 02/02/17.

## 2017-01-13 NOTE — Telephone Encounter (Signed)
Copied from [place] [id number]. Topic: Quick Communication - See Telephone Encounter >> Jan 13, 2017  3:13 PM Vernona Rieger wrote: CRM for notification. See Telephone encounter for:  Larena Glassman from [name] imaging called and said she needs a order for MRI left ankle without contrast. She is asking if he wants the MRI of the foot & if so it needs to be two separate orders. Please advise. [phone].  01/13/17.

## 2017-01-13 NOTE — Telephone Encounter (Signed)
MRI already completed at [facility]. Order needs to be removed with [facility].

## 2017-01-13 NOTE — Telephone Encounter (Signed)
Received signed domestic return receipt verifying delivery of certified letter on January 07, 2017. Article number [id number] [id number]

## 2017-01-13 NOTE — Telephone Encounter (Signed)
[facility] is now aware that patient has already had this testing done.  Nothing further is needed.

## 2017-01-16 ENCOUNTER — Telehealth: Payer: Self-pay | Admitting: Physician Assistant

## 2017-01-16 DIAGNOSIS — N390 Urinary tract infection, site not specified: Secondary | ICD-10-CM | POA: Diagnosis not present

## 2017-01-16 DIAGNOSIS — R937 Abnormal findings on diagnostic imaging of other parts of musculoskeletal system: Secondary | ICD-10-CM

## 2017-01-16 DIAGNOSIS — M6788 Other specified disorders of synovium and tendon, other site: Secondary | ICD-10-CM

## 2017-01-16 DIAGNOSIS — M722 Plantar fascial fibromatosis: Secondary | ICD-10-CM

## 2017-01-16 MED ORDER — DICLOFENAC SODIUM 1 % TD GEL: 2.0000 g | Freq: Four times a day (QID) | TRANSDERMAL | 0 refills | Status: DC

## 2017-01-16 NOTE — Telephone Encounter (Signed)
Reached pt's VM.   Advised that we have tried her numerous times to give her her results for her recent MRI. Reminded her from conversation last week that results are in the mail. In addition, based on her results a referral is being place to Ortho and medication refill will be sent in to [facility].

## 2017-01-16 NOTE — Telephone Encounter (Signed)
Ok to send in refill. A copy of MRI results has been mailed.  I am setting up Ortho evaluation for her.  Estill Bamberg please talk to patient regarding our inability to speak with her regarding results. If she is willing to receive results via phone, please transfer to [facility].

## 2017-01-16 NOTE — Telephone Encounter (Signed)
Copied from [place] [phone]. Topic: General - Other >> Jan 16, 2017  1:29 PM Darl Householder, RMA wrote: Reason for CRM: patient is requesting a refill on Diclofenac sodium 1% gel to be sent to [facility], pt is also requesting a copy of her MRI results

## 2017-01-16 NOTE — Telephone Encounter (Signed)
Fill sent. Urgent Ortho referral placed. [facility] aware.

## 2017-01-20 ENCOUNTER — Telehealth: Payer: Self-pay | Admitting: Physician Assistant

## 2017-01-20 NOTE — Telephone Encounter (Signed)
Copied from [place]. Topic: Quick Communication - See Telephone Encounter >> Jan 20, 2017  7:33 AM Hewitt Shorts wrote: CRM for notification. See Telephone encounter for: pt is needing to speak with someone about getting a boot to wear for the foot issue until she can see Dr. Maxie Better  Best number   01/20/17.

## 2017-01-20 NOTE — Telephone Encounter (Signed)
Copied from [place]. Topic: Quick Communication - See Telephone Encounter >> Jan 20, 2017  7:33 AM Hewitt Shorts wrote: CRM for notification. See Telephone encounter for: pt is needing to speak with someone about getting a boot to wear for the foot issue until she can see Dr. Maxie Better  Best number   01/20/17.

## 2017-01-20 NOTE — Telephone Encounter (Signed)
Copied from [place]. Topic: Quick Communication - See Telephone Encounter >> Jan 20, 2017  7:33 AM Hewitt Shorts wrote: CRM for notification. See Telephone encounter for: pt is needing to speak with someone about getting a boot to wear for the foot issue until she can see Dr. Maxie Better  Best number   01/20/17.

## 2017-01-23 ENCOUNTER — Telehealth: Payer: Self-pay | Admitting: *Deleted

## 2017-01-23 ENCOUNTER — Encounter: Payer: Self-pay | Admitting: Physician Assistant

## 2017-01-23 DIAGNOSIS — M6788 Other specified disorders of synovium and tendon, other site: Secondary | ICD-10-CM

## 2017-01-23 HISTORY — DX: Other specified disorders of synovium and tendon, other site: M67.88

## 2017-01-23 NOTE — Telephone Encounter (Signed)
We need to inform patient and send over the prescription for her since [facility] cannot accommodate.

## 2017-01-23 NOTE — Telephone Encounter (Signed)
Ok to try [employer] supply. We will need to inform patient of the need to change suppliers or see if [facility] has any similar devices for use

## 2017-01-23 NOTE — Telephone Encounter (Signed)
[place] for walking boot They do have the short walking boot in stock for $59.95 and tall walking boot for $75.00. She will not be charged any taxes with prescription and doesn't file insurance. Patient can purchase. Patient can take off on her taxes.

## 2017-01-23 NOTE — Telephone Encounter (Signed)
Patient states that we can send the order to [facility]

## 2017-01-23 NOTE — Telephone Encounter (Signed)
LM for patient to call to discuss.  

## 2017-01-23 NOTE — Telephone Encounter (Signed)
Copied from [place] [phone]. Topic: Inquiry >> Jan 23, 2017  2:47 PM Oliver Pila B wrote: Reason for CRM: [facility] stated the boot that was requested they do not carry that boot, and wanted to inform you to try [facility], contact jason if needed @ [phone] ext [phone]

## 2017-01-23 NOTE — Telephone Encounter (Signed)
You can call her to let her know we can order through a medical supply store. Does she have a preference of any particular store? Let me know. If no preference, will send to [facility] for her.

## 2017-01-23 NOTE — Telephone Encounter (Signed)
Rx written. Ready to be faxed in.

## 2017-01-23 NOTE — Telephone Encounter (Signed)
Would you like this to be sent to [facility]?

## 2017-01-24 NOTE — Telephone Encounter (Signed)
Attempted to call patient - went straight to VM, VM was full and I was not able to leave a message.  Will try to call again later today.

## 2017-01-25 ENCOUNTER — Telehealth: Payer: Self-pay | Admitting: Physician Assistant

## 2017-01-25 NOTE — Telephone Encounter (Signed)
Received a direct call (ring Jasmine [name]'s phone first then mine) from pt husband stating that [place] does not carry the boot and asking if we could send the order to [facility] Fax # [phone]

## 2017-01-25 NOTE — Telephone Encounter (Signed)
Still unable to reach patient.  Will address notes below if patient calls.

## 2017-01-26 DIAGNOSIS — M722 Plantar fascial fibromatosis: Secondary | ICD-10-CM | POA: Diagnosis not present

## 2017-01-26 DIAGNOSIS — M7662 Achilles tendinitis, left leg: Secondary | ICD-10-CM | POA: Diagnosis not present

## 2017-01-26 NOTE — Telephone Encounter (Signed)
Order faxed to [facility] patient preference

## 2017-01-28 DIAGNOSIS — M791 Myalgia, unspecified site: Secondary | ICD-10-CM | POA: Diagnosis not present

## 2017-01-31 ENCOUNTER — Telehealth: Payer: Self-pay

## 2017-01-31 MED ORDER — GABAPENTIN 800 MG PO TABS: 800.0000 mg | ORAL_TABLET | Freq: Three times a day (TID) | ORAL | 3 refills | Status: DC

## 2017-01-31 NOTE — Telephone Encounter (Signed)
Patient called office back. She has been having increased pain. She is taking gabapentin 600mg  3x/day but wondering if dose can be increased. She stopped taking all other pain meds.  She would like increased dose if okay per CW,MD to be called into: [place], [place]. [address]  Advised I will send request to CW,MD for her. She verbalized understanding.

## 2017-01-31 NOTE — Telephone Encounter (Signed)
Called and LVM returning pt's call. I need some more information as to what her questions are regarding her gabapentin. Gave [facility] phone number for call back

## 2017-01-31 NOTE — Telephone Encounter (Signed)
Patient said that the last time she saw Dr. [name] he had mentioned that she could go as high as 2500mg , but she was always too afraid to do that, but she thinks maybe that will help her pain now

## 2017-01-31 NOTE — Telephone Encounter (Signed)
Patient calling to discuss her gabapentin.

## 2017-01-31 NOTE — Telephone Encounter (Signed)
I called the patient.  Okay to go up to 4 of the 600 mg capsules daily.  I will call in a prescription for the 800 mg capsules taking 1 capsule 3 times daily.

## 2017-01-31 NOTE — Addendum Note (Signed)
Addended by: Kathrynn Ducking on: 01/31/2017 04:47 PM   Modules accepted: Orders

## 2017-02-06 DIAGNOSIS — M5440 Lumbago with sciatica, unspecified side: Secondary | ICD-10-CM | POA: Diagnosis not present

## 2017-02-06 DIAGNOSIS — M5416 Radiculopathy, lumbar region: Secondary | ICD-10-CM | POA: Diagnosis not present

## 2017-02-06 DIAGNOSIS — M791 Myalgia, unspecified site: Secondary | ICD-10-CM | POA: Diagnosis not present

## 2017-02-06 DIAGNOSIS — R410 Disorientation, unspecified: Secondary | ICD-10-CM | POA: Diagnosis not present

## 2017-02-06 DIAGNOSIS — M545 Low back pain: Secondary | ICD-10-CM | POA: Diagnosis not present

## 2017-02-07 ENCOUNTER — Other Ambulatory Visit: Payer: Self-pay | Admitting: Physician Assistant

## 2017-02-10 ENCOUNTER — Telehealth: Payer: Self-pay | Admitting: Neurology

## 2017-02-10 ENCOUNTER — Other Ambulatory Visit: Payer: Self-pay | Admitting: Neurology

## 2017-02-10 MED ORDER — GABAPENTIN 800 MG PO TABS: 800.0000 mg | ORAL_TABLET | Freq: Three times a day (TID) | ORAL | 3 refills | Status: DC

## 2017-02-10 NOTE — Telephone Encounter (Signed)
I received a call from on sitting service that patient requested a refill of gabapentin. I deflated gabapentin 800 mg 3 times daily and called and informed the patient.

## 2017-02-13 ENCOUNTER — Telehealth: Payer: Self-pay | Admitting: Neurology

## 2017-02-13 DIAGNOSIS — R202 Paresthesia of skin: Secondary | ICD-10-CM

## 2017-02-13 MED ORDER — METHOCARBAMOL 500 MG PO TABS: 500.0000 mg | ORAL_TABLET | Freq: Three times a day (TID) | ORAL | 1 refills | Status: DC

## 2017-02-13 NOTE — Telephone Encounter (Signed)
I called the patient.  The patient received a refill for the gabapentin for 90 tablets and 3 refills.  If she needs something more than this, she is to contact our office back.

## 2017-02-13 NOTE — Telephone Encounter (Signed)
I called the patient.  The patient already has hydrocodone through her primary care physician.  She is on gabapentin, I will call in methocarbamol, she is seeing a chiropractor but if desired I will be happy to get her set up with physical therapy.

## 2017-02-13 NOTE — Addendum Note (Signed)
Addended by: Kathrynn Ducking on: 02/13/2017 05:53 PM   Modules accepted: Orders
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
## 2017-02-13 NOTE — Telephone Encounter (Signed)
Pt said she was advised by Dr Leonie Man to call back today and talk with Dr [name] reg refill. She can be reached at [phone]

## 2017-02-16 NOTE — Telephone Encounter (Signed)
I called the patient.  Left message, I will call back later.

## 2017-02-16 NOTE — Telephone Encounter (Signed)
I called again, this time unable to leave a message, I will try to call back tomorrow morning.

## 2017-02-16 NOTE — Telephone Encounter (Signed)
Pt returned providers call. Said she is not interested in any narcotic medication. She did get norflex from ED provider. She is doing much better since starting that again.  Please call her back to discuss
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
## 2017-02-17 NOTE — Telephone Encounter (Signed)
I called the patient again, left another message, I will call back later this afternoon.

## 2017-02-17 NOTE — Addendum Note (Signed)
Addended by: Kathrynn Ducking on: 02/17/2017 11:18 AM   Modules accepted: Orders

## 2017-02-17 NOTE — Telephone Encounter (Signed)
Pt returned providers call. She also said she is wanting to be tested for peripheral neuropathy, 2 other sibling have it. Please call to advise

## 2017-02-24 ENCOUNTER — Other Ambulatory Visit: Payer: Self-pay | Admitting: Physician Assistant

## 2017-02-26 DIAGNOSIS — I10 Essential (primary) hypertension: Secondary | ICD-10-CM | POA: Diagnosis not present

## 2017-02-28 DIAGNOSIS — M791 Myalgia, unspecified site: Secondary | ICD-10-CM | POA: Diagnosis not present

## 2017-03-01 DIAGNOSIS — M546 Pain in thoracic spine: Secondary | ICD-10-CM | POA: Diagnosis not present

## 2017-03-01 DIAGNOSIS — M549 Dorsalgia, unspecified: Secondary | ICD-10-CM | POA: Diagnosis not present

## 2017-03-02 ENCOUNTER — Telehealth: Payer: Self-pay | Admitting: Physician Assistant

## 2017-03-02 NOTE — Telephone Encounter (Signed)
I do not have chronic kidney disease in her chart that I can see. Also recent labs in November at ER showed normal kidney function. She will need to contact her new PCP regarding pain. She is not a patient at our practice anymore as noted in her chart.

## 2017-03-02 NOTE — Telephone Encounter (Signed)
Copied from [place] [id number]. Topic: Quick Communication - See Telephone Encounter >> Mar 02, 2017 10:26 AM Hewitt Shorts wrote: CRM for notification. See Telephone encounter for:  Pt is wanting to let the provider know why she is having so much pain in her back and that is because she has a compression fracture C6 and wants to know why chronic kidney is in her chart   Best number [phone] only information I could get out of patient 03/02/17.

## 2017-03-03 DIAGNOSIS — M797 Fibromyalgia: Secondary | ICD-10-CM | POA: Diagnosis not present

## 2017-03-03 DIAGNOSIS — R531 Weakness: Secondary | ICD-10-CM | POA: Diagnosis not present

## 2017-03-03 DIAGNOSIS — I10 Essential (primary) hypertension: Secondary | ICD-10-CM | POA: Diagnosis not present

## 2017-03-04 DIAGNOSIS — M549 Dorsalgia, unspecified: Secondary | ICD-10-CM | POA: Diagnosis not present

## 2017-03-04 DIAGNOSIS — M546 Pain in thoracic spine: Secondary | ICD-10-CM | POA: Diagnosis not present

## 2017-03-04 DIAGNOSIS — M40204 Unspecified kyphosis, thoracic region: Secondary | ICD-10-CM | POA: Diagnosis not present

## 2017-03-04 DIAGNOSIS — M5134 Other intervertebral disc degeneration, thoracic region: Secondary | ICD-10-CM | POA: Diagnosis not present

## 2017-03-05 DIAGNOSIS — I10 Essential (primary) hypertension: Secondary | ICD-10-CM | POA: Diagnosis not present

## 2017-03-17 DIAGNOSIS — M549 Dorsalgia, unspecified: Secondary | ICD-10-CM | POA: Diagnosis not present

## 2017-03-17 DIAGNOSIS — M47814 Spondylosis without myelopathy or radiculopathy, thoracic region: Secondary | ICD-10-CM | POA: Diagnosis not present

## 2017-03-31 DIAGNOSIS — M791 Myalgia, unspecified site: Secondary | ICD-10-CM | POA: Diagnosis not present

## 2017-04-10 DIAGNOSIS — M797 Fibromyalgia: Secondary | ICD-10-CM | POA: Diagnosis not present

## 2017-04-10 DIAGNOSIS — R200 Anesthesia of skin: Secondary | ICD-10-CM | POA: Diagnosis not present

## 2017-04-12 DIAGNOSIS — K5900 Constipation, unspecified: Secondary | ICD-10-CM | POA: Diagnosis not present

## 2017-04-12 DIAGNOSIS — N816 Rectocele: Secondary | ICD-10-CM | POA: Diagnosis not present

## 2017-04-14 ENCOUNTER — Telehealth: Payer: Self-pay | Admitting: Neurology

## 2017-04-14 ENCOUNTER — Ambulatory Visit: Payer: 59 | Admitting: Neurology

## 2017-04-14 ENCOUNTER — Other Ambulatory Visit: Payer: Self-pay | Admitting: *Deleted

## 2017-04-14 MED ORDER — ROSUVASTATIN CALCIUM 20 MG PO TABS: ORAL_TABLET | ORAL | 1 refills | Status: DC

## 2017-04-14 MED ORDER — LOSARTAN POTASSIUM 25 MG PO TABS: 25.0000 mg | ORAL_TABLET | Freq: Two times a day (BID) | ORAL | 1 refills | Status: DC

## 2017-04-14 MED ORDER — METOPROLOL SUCCINATE ER 25 MG PO TB24: 75.0000 mg | ORAL_TABLET | Freq: Every day | ORAL | 1 refills | Status: DC

## 2017-04-14 NOTE — Telephone Encounter (Signed)
Primary has been refilling these. Patient requesting that Dr Irish Lack refill them as she has a new pcp that she has not been in to see yet. Okay to refill? Please advise. Thanks, MI

## 2017-04-14 NOTE — Telephone Encounter (Signed)
This patient did not show for a revisit appointment today. 

## 2017-04-17 ENCOUNTER — Encounter: Payer: Self-pay | Admitting: Neurology

## 2017-04-20 DIAGNOSIS — H26491 Other secondary cataract, right eye: Secondary | ICD-10-CM | POA: Diagnosis not present

## 2017-04-20 DIAGNOSIS — H01001 Unspecified blepharitis right upper eyelid: Secondary | ICD-10-CM | POA: Diagnosis not present

## 2017-04-20 DIAGNOSIS — S0501XA Injury of conjunctiva and corneal abrasion without foreign body, right eye, initial encounter: Secondary | ICD-10-CM | POA: Diagnosis not present

## 2017-04-24 DIAGNOSIS — H16121 Filamentary keratitis, right eye: Secondary | ICD-10-CM | POA: Diagnosis not present

## 2017-04-24 DIAGNOSIS — S0502XD Injury of conjunctiva and corneal abrasion without foreign body, left eye, subsequent encounter: Secondary | ICD-10-CM | POA: Diagnosis not present

## 2017-04-24 DIAGNOSIS — H04123 Dry eye syndrome of bilateral lacrimal glands: Secondary | ICD-10-CM | POA: Diagnosis not present

## 2017-04-25 DIAGNOSIS — G894 Chronic pain syndrome: Secondary | ICD-10-CM | POA: Diagnosis not present

## 2017-04-25 DIAGNOSIS — M47814 Spondylosis without myelopathy or radiculopathy, thoracic region: Secondary | ICD-10-CM | POA: Diagnosis not present

## 2017-04-25 DIAGNOSIS — R292 Abnormal reflex: Secondary | ICD-10-CM | POA: Diagnosis not present

## 2017-04-25 DIAGNOSIS — Z79899 Other long term (current) drug therapy: Secondary | ICD-10-CM | POA: Diagnosis not present

## 2017-04-25 DIAGNOSIS — Z5181 Encounter for therapeutic drug level monitoring: Secondary | ICD-10-CM | POA: Diagnosis not present

## 2017-04-27 DIAGNOSIS — S0502XD Injury of conjunctiva and corneal abrasion without foreign body, left eye, subsequent encounter: Secondary | ICD-10-CM | POA: Diagnosis not present

## 2017-04-27 DIAGNOSIS — H04123 Dry eye syndrome of bilateral lacrimal glands: Secondary | ICD-10-CM | POA: Diagnosis not present

## 2017-04-27 DIAGNOSIS — H16121 Filamentary keratitis, right eye: Secondary | ICD-10-CM | POA: Diagnosis not present

## 2017-04-28 DIAGNOSIS — M791 Myalgia, unspecified site: Secondary | ICD-10-CM | POA: Diagnosis not present

## 2017-04-30 DIAGNOSIS — J0100 Acute maxillary sinusitis, unspecified: Secondary | ICD-10-CM | POA: Diagnosis not present

## 2017-05-09 DIAGNOSIS — M47814 Spondylosis without myelopathy or radiculopathy, thoracic region: Secondary | ICD-10-CM | POA: Diagnosis not present

## 2017-05-17 DIAGNOSIS — G8929 Other chronic pain: Secondary | ICD-10-CM | POA: Diagnosis not present

## 2017-05-17 DIAGNOSIS — R200 Anesthesia of skin: Secondary | ICD-10-CM | POA: Diagnosis not present

## 2017-05-17 DIAGNOSIS — M545 Low back pain: Secondary | ICD-10-CM | POA: Diagnosis not present

## 2017-05-18 DIAGNOSIS — M5134 Other intervertebral disc degeneration, thoracic region: Secondary | ICD-10-CM | POA: Diagnosis not present

## 2017-05-18 DIAGNOSIS — G894 Chronic pain syndrome: Secondary | ICD-10-CM | POA: Diagnosis not present

## 2017-05-26 DIAGNOSIS — G8929 Other chronic pain: Secondary | ICD-10-CM | POA: Diagnosis not present

## 2017-05-26 DIAGNOSIS — M546 Pain in thoracic spine: Secondary | ICD-10-CM | POA: Diagnosis not present

## 2017-05-27 DIAGNOSIS — T7840XA Allergy, unspecified, initial encounter: Secondary | ICD-10-CM | POA: Diagnosis not present

## 2017-05-29 DIAGNOSIS — M791 Myalgia, unspecified site: Secondary | ICD-10-CM | POA: Diagnosis not present

## 2017-05-31 DIAGNOSIS — Z85828 Personal history of other malignant neoplasm of skin: Secondary | ICD-10-CM | POA: Diagnosis not present

## 2017-05-31 DIAGNOSIS — D2372 Other benign neoplasm of skin of left lower limb, including hip: Secondary | ICD-10-CM | POA: Diagnosis not present

## 2017-05-31 DIAGNOSIS — L821 Other seborrheic keratosis: Secondary | ICD-10-CM | POA: Diagnosis not present

## 2017-06-02 DIAGNOSIS — E210 Primary hyperparathyroidism: Secondary | ICD-10-CM | POA: Diagnosis not present

## 2017-06-02 DIAGNOSIS — E7849 Other hyperlipidemia: Secondary | ICD-10-CM | POA: Diagnosis not present

## 2017-06-02 DIAGNOSIS — I10 Essential (primary) hypertension: Secondary | ICD-10-CM | POA: Diagnosis not present

## 2017-06-12 ENCOUNTER — Ambulatory Visit: Payer: Self-pay | Admitting: Neurology

## 2017-06-12 ENCOUNTER — Ambulatory Visit: Payer: 59 | Admitting: Adult Health

## 2017-06-13 DIAGNOSIS — M546 Pain in thoracic spine: Secondary | ICD-10-CM | POA: Diagnosis not present

## 2017-06-13 DIAGNOSIS — G8929 Other chronic pain: Secondary | ICD-10-CM | POA: Diagnosis not present

## 2017-06-15 DIAGNOSIS — M5134 Other intervertebral disc degeneration, thoracic region: Secondary | ICD-10-CM | POA: Diagnosis not present

## 2017-06-28 DIAGNOSIS — M791 Myalgia, unspecified site: Secondary | ICD-10-CM | POA: Diagnosis not present

## 2017-06-29 DIAGNOSIS — M47814 Spondylosis without myelopathy or radiculopathy, thoracic region: Secondary | ICD-10-CM | POA: Diagnosis not present

## 2017-06-29 DIAGNOSIS — G894 Chronic pain syndrome: Secondary | ICD-10-CM | POA: Diagnosis not present

## 2017-07-10 DIAGNOSIS — Z85828 Personal history of other malignant neoplasm of skin: Secondary | ICD-10-CM | POA: Diagnosis not present

## 2017-07-10 DIAGNOSIS — L821 Other seborrheic keratosis: Secondary | ICD-10-CM | POA: Diagnosis not present

## 2017-07-10 DIAGNOSIS — L738 Other specified follicular disorders: Secondary | ICD-10-CM | POA: Diagnosis not present

## 2017-07-29 DIAGNOSIS — M791 Myalgia, unspecified site: Secondary | ICD-10-CM | POA: Diagnosis not present

## 2017-08-04 DIAGNOSIS — N390 Urinary tract infection, site not specified: Secondary | ICD-10-CM | POA: Diagnosis not present

## 2017-08-04 DIAGNOSIS — R109 Unspecified abdominal pain: Secondary | ICD-10-CM | POA: Diagnosis not present

## 2017-08-04 DIAGNOSIS — Z6831 Body mass index (BMI) 31.0-31.9, adult: Secondary | ICD-10-CM | POA: Diagnosis not present

## 2017-08-04 DIAGNOSIS — R1084 Generalized abdominal pain: Secondary | ICD-10-CM | POA: Diagnosis not present

## 2017-08-04 DIAGNOSIS — M545 Low back pain: Secondary | ICD-10-CM | POA: Diagnosis not present

## 2017-08-10 DIAGNOSIS — M47814 Spondylosis without myelopathy or radiculopathy, thoracic region: Secondary | ICD-10-CM | POA: Diagnosis not present

## 2017-08-10 DIAGNOSIS — G894 Chronic pain syndrome: Secondary | ICD-10-CM | POA: Diagnosis not present

## 2017-08-10 DIAGNOSIS — M5134 Other intervertebral disc degeneration, thoracic region: Secondary | ICD-10-CM | POA: Diagnosis not present

## 2017-08-28 DIAGNOSIS — M791 Myalgia, unspecified site: Secondary | ICD-10-CM | POA: Diagnosis not present

## 2017-08-29 DIAGNOSIS — M545 Low back pain: Secondary | ICD-10-CM | POA: Diagnosis not present

## 2017-08-29 DIAGNOSIS — R2689 Other abnormalities of gait and mobility: Secondary | ICD-10-CM | POA: Diagnosis not present

## 2017-08-29 DIAGNOSIS — R293 Abnormal posture: Secondary | ICD-10-CM | POA: Diagnosis not present

## 2017-09-05 DIAGNOSIS — R2689 Other abnormalities of gait and mobility: Secondary | ICD-10-CM | POA: Diagnosis not present

## 2017-09-05 DIAGNOSIS — R293 Abnormal posture: Secondary | ICD-10-CM | POA: Diagnosis not present

## 2017-09-05 DIAGNOSIS — M545 Low back pain: Secondary | ICD-10-CM | POA: Diagnosis not present

## 2017-09-06 DIAGNOSIS — R2689 Other abnormalities of gait and mobility: Secondary | ICD-10-CM | POA: Diagnosis not present

## 2017-09-06 DIAGNOSIS — M545 Low back pain: Secondary | ICD-10-CM | POA: Diagnosis not present

## 2017-09-06 DIAGNOSIS — R293 Abnormal posture: Secondary | ICD-10-CM | POA: Diagnosis not present

## 2017-09-13 DIAGNOSIS — R2689 Other abnormalities of gait and mobility: Secondary | ICD-10-CM | POA: Diagnosis not present

## 2017-09-13 DIAGNOSIS — R293 Abnormal posture: Secondary | ICD-10-CM | POA: Diagnosis not present

## 2017-09-13 DIAGNOSIS — M545 Low back pain: Secondary | ICD-10-CM | POA: Diagnosis not present

## 2017-09-21 DIAGNOSIS — Z87442 Personal history of urinary calculi: Secondary | ICD-10-CM | POA: Diagnosis not present

## 2017-09-21 DIAGNOSIS — R109 Unspecified abdominal pain: Secondary | ICD-10-CM | POA: Diagnosis not present

## 2017-09-26 DIAGNOSIS — R293 Abnormal posture: Secondary | ICD-10-CM | POA: Diagnosis not present

## 2017-09-26 DIAGNOSIS — M545 Low back pain: Secondary | ICD-10-CM | POA: Diagnosis not present

## 2017-09-26 DIAGNOSIS — R2689 Other abnormalities of gait and mobility: Secondary | ICD-10-CM | POA: Diagnosis not present

## 2017-09-28 DIAGNOSIS — R293 Abnormal posture: Secondary | ICD-10-CM | POA: Diagnosis not present

## 2017-09-28 DIAGNOSIS — R2689 Other abnormalities of gait and mobility: Secondary | ICD-10-CM | POA: Diagnosis not present

## 2017-09-28 DIAGNOSIS — M791 Myalgia, unspecified site: Secondary | ICD-10-CM | POA: Diagnosis not present

## 2017-09-28 DIAGNOSIS — M545 Low back pain: Secondary | ICD-10-CM | POA: Diagnosis not present

## 2017-10-03 DIAGNOSIS — R2689 Other abnormalities of gait and mobility: Secondary | ICD-10-CM | POA: Diagnosis not present

## 2017-10-03 DIAGNOSIS — M545 Low back pain: Secondary | ICD-10-CM | POA: Diagnosis not present

## 2017-10-03 DIAGNOSIS — R293 Abnormal posture: Secondary | ICD-10-CM | POA: Diagnosis not present

## 2017-10-05 DIAGNOSIS — Z1231 Encounter for screening mammogram for malignant neoplasm of breast: Secondary | ICD-10-CM | POA: Diagnosis not present

## 2017-10-05 DIAGNOSIS — Z139 Encounter for screening, unspecified: Secondary | ICD-10-CM | POA: Diagnosis not present

## 2017-10-11 DIAGNOSIS — R2689 Other abnormalities of gait and mobility: Secondary | ICD-10-CM | POA: Diagnosis not present

## 2017-10-11 DIAGNOSIS — M545 Low back pain: Secondary | ICD-10-CM | POA: Diagnosis not present

## 2017-10-11 DIAGNOSIS — R293 Abnormal posture: Secondary | ICD-10-CM | POA: Diagnosis not present

## 2017-10-18 DIAGNOSIS — R293 Abnormal posture: Secondary | ICD-10-CM | POA: Diagnosis not present

## 2017-10-18 DIAGNOSIS — M545 Low back pain: Secondary | ICD-10-CM | POA: Diagnosis not present

## 2017-10-18 DIAGNOSIS — R2689 Other abnormalities of gait and mobility: Secondary | ICD-10-CM | POA: Diagnosis not present

## 2017-10-19 DIAGNOSIS — Z01419 Encounter for gynecological examination (general) (routine) without abnormal findings: Secondary | ICD-10-CM | POA: Diagnosis not present

## 2017-10-19 DIAGNOSIS — I10 Essential (primary) hypertension: Secondary | ICD-10-CM | POA: Diagnosis not present

## 2017-10-19 DIAGNOSIS — R51 Headache: Secondary | ICD-10-CM | POA: Diagnosis not present

## 2017-10-19 DIAGNOSIS — Z6833 Body mass index (BMI) 33.0-33.9, adult: Secondary | ICD-10-CM | POA: Diagnosis not present

## 2017-10-19 DIAGNOSIS — R410 Disorientation, unspecified: Secondary | ICD-10-CM | POA: Diagnosis not present

## 2017-10-29 DIAGNOSIS — M791 Myalgia, unspecified site: Secondary | ICD-10-CM | POA: Diagnosis not present

## 2017-11-02 ENCOUNTER — Other Ambulatory Visit: Payer: Self-pay | Admitting: Interventional Cardiology

## 2017-11-03 DIAGNOSIS — R293 Abnormal posture: Secondary | ICD-10-CM | POA: Diagnosis not present

## 2017-11-03 DIAGNOSIS — R2689 Other abnormalities of gait and mobility: Secondary | ICD-10-CM | POA: Diagnosis not present

## 2017-11-03 DIAGNOSIS — M545 Low back pain: Secondary | ICD-10-CM | POA: Diagnosis not present

## 2017-11-10 DIAGNOSIS — M545 Low back pain: Secondary | ICD-10-CM | POA: Diagnosis not present

## 2017-11-10 DIAGNOSIS — R2689 Other abnormalities of gait and mobility: Secondary | ICD-10-CM | POA: Diagnosis not present

## 2017-11-10 DIAGNOSIS — R293 Abnormal posture: Secondary | ICD-10-CM | POA: Diagnosis not present

## 2017-11-14 ENCOUNTER — Other Ambulatory Visit: Payer: Self-pay | Admitting: Interventional Cardiology

## 2017-11-15 DIAGNOSIS — R2689 Other abnormalities of gait and mobility: Secondary | ICD-10-CM | POA: Diagnosis not present

## 2017-11-15 DIAGNOSIS — R293 Abnormal posture: Secondary | ICD-10-CM | POA: Diagnosis not present

## 2017-11-15 DIAGNOSIS — M545 Low back pain: Secondary | ICD-10-CM | POA: Diagnosis not present

## 2017-11-21 DIAGNOSIS — G894 Chronic pain syndrome: Secondary | ICD-10-CM | POA: Diagnosis not present

## 2017-11-21 DIAGNOSIS — M5134 Other intervertebral disc degeneration, thoracic region: Secondary | ICD-10-CM | POA: Diagnosis not present

## 2017-11-21 DIAGNOSIS — M47814 Spondylosis without myelopathy or radiculopathy, thoracic region: Secondary | ICD-10-CM | POA: Diagnosis not present

## 2017-11-23 DIAGNOSIS — M5414 Radiculopathy, thoracic region: Secondary | ICD-10-CM | POA: Diagnosis not present

## 2017-11-24 DIAGNOSIS — R2689 Other abnormalities of gait and mobility: Secondary | ICD-10-CM | POA: Diagnosis not present

## 2017-11-24 DIAGNOSIS — M545 Low back pain: Secondary | ICD-10-CM | POA: Diagnosis not present

## 2017-11-24 DIAGNOSIS — R293 Abnormal posture: Secondary | ICD-10-CM | POA: Diagnosis not present

## 2017-11-28 DIAGNOSIS — R293 Abnormal posture: Secondary | ICD-10-CM | POA: Diagnosis not present

## 2017-11-28 DIAGNOSIS — R2689 Other abnormalities of gait and mobility: Secondary | ICD-10-CM | POA: Diagnosis not present

## 2017-11-28 DIAGNOSIS — M545 Low back pain: Secondary | ICD-10-CM | POA: Diagnosis not present

## 2017-11-28 DIAGNOSIS — M791 Myalgia, unspecified site: Secondary | ICD-10-CM | POA: Diagnosis not present

## 2017-11-29 ENCOUNTER — Other Ambulatory Visit: Payer: Self-pay | Admitting: Interventional Cardiology

## 2017-11-29 DIAGNOSIS — J209 Acute bronchitis, unspecified: Secondary | ICD-10-CM | POA: Diagnosis not present

## 2017-11-29 DIAGNOSIS — R0602 Shortness of breath: Secondary | ICD-10-CM | POA: Diagnosis not present

## 2017-11-29 DIAGNOSIS — R05 Cough: Secondary | ICD-10-CM | POA: Diagnosis not present

## 2017-12-05 DIAGNOSIS — R2689 Other abnormalities of gait and mobility: Secondary | ICD-10-CM | POA: Diagnosis not present

## 2017-12-05 DIAGNOSIS — R82998 Other abnormal findings in urine: Secondary | ICD-10-CM | POA: Diagnosis not present

## 2017-12-05 DIAGNOSIS — Z0000 Encounter for general adult medical examination without abnormal findings: Secondary | ICD-10-CM | POA: Diagnosis not present

## 2017-12-05 DIAGNOSIS — R293 Abnormal posture: Secondary | ICD-10-CM | POA: Diagnosis not present

## 2017-12-05 DIAGNOSIS — M545 Low back pain: Secondary | ICD-10-CM | POA: Diagnosis not present

## 2017-12-05 DIAGNOSIS — I10 Essential (primary) hypertension: Secondary | ICD-10-CM | POA: Diagnosis not present

## 2017-12-12 DIAGNOSIS — Z9621 Cochlear implant status: Secondary | ICD-10-CM | POA: Diagnosis not present

## 2017-12-12 DIAGNOSIS — Z23 Encounter for immunization: Secondary | ICD-10-CM | POA: Diagnosis not present

## 2017-12-12 DIAGNOSIS — Z0000 Encounter for general adult medical examination without abnormal findings: Secondary | ICD-10-CM | POA: Diagnosis not present

## 2017-12-12 DIAGNOSIS — E210 Primary hyperparathyroidism: Secondary | ICD-10-CM | POA: Diagnosis not present

## 2017-12-12 DIAGNOSIS — I10 Essential (primary) hypertension: Secondary | ICD-10-CM | POA: Diagnosis not present

## 2017-12-14 DIAGNOSIS — Z1212 Encounter for screening for malignant neoplasm of rectum: Secondary | ICD-10-CM | POA: Diagnosis not present

## 2017-12-18 DIAGNOSIS — M542 Cervicalgia: Secondary | ICD-10-CM | POA: Diagnosis not present

## 2017-12-19 DIAGNOSIS — H26491 Other secondary cataract, right eye: Secondary | ICD-10-CM | POA: Diagnosis not present

## 2017-12-19 DIAGNOSIS — H16121 Filamentary keratitis, right eye: Secondary | ICD-10-CM | POA: Diagnosis not present

## 2017-12-19 DIAGNOSIS — H04123 Dry eye syndrome of bilateral lacrimal glands: Secondary | ICD-10-CM | POA: Diagnosis not present

## 2017-12-21 DIAGNOSIS — R2689 Other abnormalities of gait and mobility: Secondary | ICD-10-CM | POA: Diagnosis not present

## 2017-12-21 DIAGNOSIS — M545 Low back pain: Secondary | ICD-10-CM | POA: Diagnosis not present

## 2017-12-21 DIAGNOSIS — R293 Abnormal posture: Secondary | ICD-10-CM | POA: Diagnosis not present

## 2017-12-29 DIAGNOSIS — M791 Myalgia, unspecified site: Secondary | ICD-10-CM | POA: Diagnosis not present

## 2018-01-11 ENCOUNTER — Ambulatory Visit (INDEPENDENT_AMBULATORY_CARE_PROVIDER_SITE_OTHER): Payer: 59 | Admitting: Family Medicine

## 2018-01-11 ENCOUNTER — Ambulatory Visit: Payer: Self-pay

## 2018-01-11 ENCOUNTER — Telehealth: Payer: Self-pay

## 2018-01-11 ENCOUNTER — Encounter: Payer: Self-pay | Admitting: Family Medicine

## 2018-01-11 VITALS — BP 185/104 | HR 61 | Ht 64.0 in | Wt 190.0 lb

## 2018-01-11 DIAGNOSIS — M545 Low back pain: Secondary | ICD-10-CM | POA: Diagnosis not present

## 2018-01-11 DIAGNOSIS — R293 Abnormal posture: Secondary | ICD-10-CM | POA: Diagnosis not present

## 2018-01-11 DIAGNOSIS — M25511 Pain in right shoulder: Secondary | ICD-10-CM | POA: Diagnosis not present

## 2018-01-11 DIAGNOSIS — R2689 Other abnormalities of gait and mobility: Secondary | ICD-10-CM | POA: Diagnosis not present

## 2018-01-11 MED ORDER — TRAMADOL HCL 50 MG PO TABS: 50.0000 mg | ORAL_TABLET | Freq: Four times a day (QID) | ORAL | 0 refills | Status: DC | PRN
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
## 2018-01-11 NOTE — Telephone Encounter (Signed)
Copied from [place] [id number]. Topic: General - Other >> Jan 10, 2018  6:04 PM Ivar Drape wrote: Reason for CRM:   Patient would like the provider to know she does not use MyChart and she would not like for him to converse by means of MyChart anymore.

## 2018-01-11 NOTE — Progress Notes (Signed)
PCP: Velna Hatchet, MD  Subjective:   HPI: Patient is a 62 y.o. female here for right shoulder pain.  Patient reports on 11/11 she was pulling cabinet in her house - did not sustained acute injury but felt pain lateral right shoulder after moving this to other side of house. Pain level is 10/10 and sharp, difficulty moving shoulder at all. Went to PT today and they recommended seeing MD for evaluation. No skin changes, numbness. Has only tried ice/heat which help some.  Past Medical History:  Diagnosis Date  . Anxiety   . Asthma   . Cervicogenic headache 08/24/2016  . Chronic high back pain   . Double vision   . Fibromyalgia   . Heart murmur   . High cholesterol   . Hypertension   . Hypoglycemia   . Kidney stone   . Meniscus tear    Right  . Migraine 08/07/2015  . PTSD (post-traumatic stress disorder)   . Schizophrenia ([place])    treated by Dr. Casimiro Needle  . Seizures ([place])    last seizure 3 years ago, not on AED  . Stroke ([place]) 07/08/11   "I've had mini strokes before; left side of face  is more down than right "  . Syncope and collapse    One episode - 07/10/15    Current Outpatient Medications on File Prior to Visit  Medication Sig Dispense Refill  . albuterol (PROVENTIL HFA;VENTOLIN HFA) 108 (90 Base) MCG/ACT inhaler Inhale 2 puffs into the lungs every 6 (six) hours as needed for wheezing or shortness of breath. 1 Inhaler 2  . aspirin EC 81 MG tablet Take 81 mg by mouth daily.    Marland Kitchen azelastine (ASTELIN) 0.1 % nasal spray Place 1 spray into both nostrils 2 (two) times daily. Use in each nostril as directed 30 mL 12  . cloNIDine (CATAPRES) 0.1 MG tablet Take by mouth.    . diclofenac sodium (VOLTAREN) 1 % GEL Apply 2 g 4 (four) times daily topically. 100 g 0  . fluticasone (FLONASE) 50 MCG/ACT nasal spray USE TWO SPRAY(S) IN EACH NOSTRIL ONCE DAILY 16 g 5  . fluticasone-salmeterol (ADVAIR HFA) 115-21 MCG/ACT inhaler Inhale 1 puff into the lungs 2 (two) times daily. 12 g 5  .  gabapentin (NEURONTIN) 800 MG tablet Take 1 tablet (800 mg total) by mouth 3 (three) times daily. 90 tablet 3  . ibuprofen (ADVIL,MOTRIN) 600 MG tablet Take 1 tablet (600 mg total) by mouth every 8 (eight) hours as needed. 90 tablet 0  . losartan (COZAAR) 25 MG tablet Take 1 tablet (25 mg total) by mouth 2 (two) times daily. 180 tablet 1  . methocarbamol (ROBAXIN) 500 MG tablet Take 1 tablet (500 mg total) by mouth 3 (three) times daily. 90 tablet 1  . metoprolol succinate (TOPROL-XL) 25 MG 24 hr tablet Take 3 tablets (75 mg total) by mouth daily. Please make overdue appt with [place] before anymore refills. 2nd attempt 45 tablet 0  . Multiple Vitamin (THERA) TABS Take by mouth.    . Omega-3 Fatty Acids (FISH OIL) 1000 MG CAPS Take 1 capsule by mouth daily.    . pantoprazole (PROTONIX) 40 MG tablet Take 1 tablet (40 mg total) by mouth daily. 30 tablet 3  . polyethylene glycol powder (GLYCOLAX/MIRALAX) powder Take 17 g by mouth 2 (two) times daily as needed. 3350 g 1  . Probiotic Product (PROBIOTIC ADVANCED PO) Take 1 tablet by mouth daily.    . promethazine (PHENERGAN) 12.5 MG tablet  Take 1 tablet (12.5 mg total) by mouth every 6 (six) hours as needed for nausea. 20 tablet 0  . ranitidine (ZANTAC) 150 MG tablet Take 150 mg by mouth at bedtime.    . rosuvastatin (CRESTOR) 20 MG tablet TAKE ONE TABLET BY MOUTH EVERY EVENING. Please make overdue appt with Dr. Irish Lack before anymore refills. 2nd attempt 15 tablet 0  . temazepam (RESTORIL) 15 MG capsule Take 2 capsules by mouth at bedtime.    Marland Kitchen zolpidem (AMBIEN) 10 MG tablet Take 2 tablets by mouth at bedtime.      No current facility-administered medications on file prior to visit.     Past Surgical History:  Procedure Laterality Date  . COCHLEAR IMPLANT  2010   left  . [place]   left  . KIDNEY STONE SURGERY  ~ 2006  . TONSILLECTOMY     "as a child"  . WISDOM TOOTH EXTRACTION      Allergies  Allergen Reactions  .  Sulfa Antibiotics Hives  . Famciclovir Other (See Comments)  . Metronidazole Nausea And Vomiting  . Soma [Carisoprodol] Other (See Comments)    Moody and sleepy Cognitive Function, Daytime sleepiness  . Acyclovir   . Acyclovir And Related   . Benadryl [Diphenhydramine Hcl]     Severe emotional reaction and doesn't work well with Pt. Past history  . Cephalosporins Hives  . Codeine Other (See Comments)    Makes patient feel odd ; "sometimes mild; sometimes severe reaction; mostly severe"  . Cyclobenzaprine Other (See Comments)    Muscle Aches.  . Diclofenac Sodium Nausea And Vomiting  . Hydrocodone-Acetaminophen   . Linzess [Linaclotide] Diarrhea  . Nitrofurantoin Monohyd Macro     Felt funny  . Prednisone Other (See Comments)    migraine  . Tizanidine     Other reaction(s): Other (See Comments) insomnia  . Baclofen Nausea Only  . Meloxicam     Other reaction(s): Confusion  . Sulfamethoxazole Rash    Social History   Socioeconomic History  . Marital status: Married    Spouse name: Not on file  . Number of children: 3  . Years of education: 2 yrs coll  . Highest education level: Not on file  Occupational History  . Occupation: Disabled  Social Needs  . Financial resource strain: Not on file  . Food insecurity:    Worry: Not on file    Inability: Not on file  . Transportation needs:    Medical: Not on file    Non-medical: Not on file  Tobacco Use  . Smoking status: Former Smoker    Packs/day: 1.00    Years: 20.00    Pack years: 20.00    Types: Cigarettes    Last attempt to quit: 02/29/2004    Years since quitting: 13.8  . Smokeless tobacco: Never Used  Substance and Sexual Activity  . Alcohol use: Yes    Alcohol/week: 0.0 standard drinks    Comment: "glass of wine q once in awhile; not very often; do it on special occasion"  . Drug use: No  . Sexual activity: Never  Lifestyle  . Physical activity:    Days per week: Not on file    Minutes per session: Not  on file  . Stress: Not on file  Relationships  . Social connections:    Talks on phone: Not on file    Gets together: Not on file    Attends religious service: Not on file  Active member of club or organization: Not on file    Attends meetings of clubs or organizations: Not on file    Relationship status: Not on file  . Intimate partner violence:    Fear of current or ex partner: Not on file    Emotionally abused: Not on file    Physically abused: Not on file    Forced sexual activity: Not on file  Other Topics Concern  . Not on file  Social History Narrative   Completed 2 years of college.    On disability.   Lives at home with her husband.   No caffeine use.   Left-handed.   Three children, 2 biological - 1 adopted.    Family History  Problem Relation Age of Onset  . Heart attack Father 22       Living  . Arthritis Father   . Skin cancer Father   . Hypertension Father   . Hyperlipidemia Father   . Leukemia Mother 3       Deceased  . Alzheimer's disease Paternal Aunt        X2  . Stomach cancer Paternal Aunt        x1  . Arthritis/Rheumatoid Paternal Aunt        x1  . Neuropathy Brother        Peripheal  . Fibromyalgia Sister   . HIV Sister   . Arthritis/Rheumatoid Sister   . Diabetes Paternal Grandmother     BP (!) 185/104   Pulse 61   Ht 5\' 4"  (1.626 m)   Wt 190 lb (86.2 kg)   BMI 32.61 kg/m   Review of Systems: See HPI above.     Objective:  Physical Exam:  Gen: NAD, comfortable in exam room  Right shoulder: No swelling, ecchymoses.  No gross deformity. Diffuse TTP anterior, lateral shoulder. ROM limited to 30 degrees flexion and abduction, 20 ER actively. Could not tolerate hawkins, [place]. Positive Yergasons. Strength 5/5 resisted internal/external rotation.  Cannot position for empty can. NV intact distally.  Left shoulder: No swelling, ecchymoses.  No gross deformity. No TTP. FROM. Strength 5/5 with empty can and resisted  internal/external rotation. NV intact distally.   MSK u/s right shoulder:  Biceps tendon intact on long and trans views without tenosynovitis.  Subscapularis intact without abnormalities.  AC joint with mild arthropathy but no effusion.  Infraspinatus intact.  Difficulty positioning in modified crass but visible supraspinatus intact without tears.  Assessment & Plan:  1. Right shoulder pain - history and ultrasound reassuring.  Consistent with rotator cuff strain without tear.  Tylenol with tramadol as needed.  Topical medications, salon pas patches as needed.  Wait to start physical therapy.  Shown motion exercises.  F/u in 2 weeks.  Consider MRI, repeat ultrasound, physical therapy if not improving.

## 2018-01-11 NOTE — Patient Instructions (Signed)
You strained your rotator cuff. Try to avoid painful activities (overhead activities, lifting with extended arm) as much as possible. Tylenol 500mg  1-2 tabs three times a day as needed. Tramadol as needed for severe pain (no driving on this - we also do not refill this medication). Topical capsaicin or aspercreme up to 4 times a day. Salon pas patches can help with pain as well. If you do physical therapy, wait at least a week to start this. When tolerated you can start arm circles, swings 3 sets of 10 once or twice a day. Follow up with me in 2 weeks.

## 2018-01-12 DIAGNOSIS — M25511 Pain in right shoulder: Secondary | ICD-10-CM | POA: Diagnosis not present

## 2018-01-12 DIAGNOSIS — G8929 Other chronic pain: Secondary | ICD-10-CM | POA: Diagnosis not present

## 2018-01-15 DIAGNOSIS — S299XXA Unspecified injury of thorax, initial encounter: Secondary | ICD-10-CM | POA: Diagnosis not present

## 2018-01-15 DIAGNOSIS — M545 Low back pain: Secondary | ICD-10-CM | POA: Diagnosis not present

## 2018-01-15 DIAGNOSIS — M25511 Pain in right shoulder: Secondary | ICD-10-CM | POA: Diagnosis not present

## 2018-01-15 DIAGNOSIS — M25512 Pain in left shoulder: Secondary | ICD-10-CM | POA: Diagnosis not present

## 2018-01-15 DIAGNOSIS — M546 Pain in thoracic spine: Secondary | ICD-10-CM | POA: Diagnosis not present

## 2018-01-15 DIAGNOSIS — S4991XA Unspecified injury of right shoulder and upper arm, initial encounter: Secondary | ICD-10-CM | POA: Diagnosis not present

## 2018-01-15 DIAGNOSIS — G8929 Other chronic pain: Secondary | ICD-10-CM | POA: Diagnosis not present

## 2018-01-15 DIAGNOSIS — S4992XA Unspecified injury of left shoulder and upper arm, initial encounter: Secondary | ICD-10-CM | POA: Diagnosis not present

## 2018-01-15 DIAGNOSIS — S39012A Strain of muscle, fascia and tendon of lower back, initial encounter: Secondary | ICD-10-CM | POA: Diagnosis not present

## 2018-01-18 ENCOUNTER — Encounter: Payer: Self-pay | Admitting: Family Medicine

## 2018-01-18 ENCOUNTER — Ambulatory Visit (INDEPENDENT_AMBULATORY_CARE_PROVIDER_SITE_OTHER): Payer: 59 | Admitting: Family Medicine

## 2018-01-18 VITALS — BP 131/83 | HR 64 | Ht 64.0 in | Wt 193.0 lb

## 2018-01-18 DIAGNOSIS — S39012A Strain of muscle, fascia and tendon of lower back, initial encounter: Secondary | ICD-10-CM | POA: Diagnosis not present

## 2018-01-18 DIAGNOSIS — R2689 Other abnormalities of gait and mobility: Secondary | ICD-10-CM | POA: Diagnosis not present

## 2018-01-18 DIAGNOSIS — M545 Low back pain: Secondary | ICD-10-CM | POA: Diagnosis not present

## 2018-01-18 DIAGNOSIS — M25511 Pain in right shoulder: Secondary | ICD-10-CM | POA: Diagnosis not present

## 2018-01-18 DIAGNOSIS — S29019A Strain of muscle and tendon of unspecified wall of thorax, initial encounter: Secondary | ICD-10-CM | POA: Diagnosis not present

## 2018-01-18 DIAGNOSIS — R293 Abnormal posture: Secondary | ICD-10-CM | POA: Diagnosis not present

## 2018-01-18 NOTE — Progress Notes (Signed)
PCP: Velna Hatchet, MD  Subjective:   HPI: Patient is a 62 y.o. female here for injuries from a fall.  Patient reports on 11/18 at [facility] she slipped and fell forward onto her arms. States increase in pain to posterior right moreso than left shoulder, upper-mid back, and low back pain. She was given toradol injection twice since visit here a week ago for her shoulder. Has been using voltaren gel, tylenol, norflex.  States tramadol made her nauseous and had trouble swallowing. States salon pas patches don't work. Taking gabapentin 800mg  qid also. No radiation into extremities. No bowel/bladder dysfunction. Pain level 2/10 and sharp in mid-back especially.  Past Medical History:  Diagnosis Date  . Anxiety   . Asthma   . Cervicogenic headache 08/24/2016  . Chronic high back pain   . Double vision   . Fibromyalgia   . Heart murmur   . High cholesterol   . Hypertension   . Hypoglycemia   . Kidney stone   . Meniscus tear    Right  . Migraine 08/07/2015  . PTSD (post-traumatic stress disorder)   . Schizophrenia ([place])    treated by Dr. Casimiro Needle  . Seizures ([place])    last seizure 3 years ago, not on AED  . Stroke ([place]) 07/08/11   "I've had mini strokes before; left side of face  is more down than right "  . Syncope and collapse    One episode - 07/10/15    Current Outpatient Medications on File Prior to Visit  Medication Sig Dispense Refill  . orphenadrine (NORFLEX) 100 MG tablet Take by mouth.    Marland Kitchen albuterol (PROVENTIL HFA;VENTOLIN HFA) 108 (90 Base) MCG/ACT inhaler Inhale 2 puffs into the lungs every 6 (six) hours as needed for wheezing or shortness of breath. 1 Inhaler 2  . aspirin EC 81 MG tablet Take 81 mg by mouth daily.    Marland Kitchen azelastine (ASTELIN) 0.1 % nasal spray Place 1 spray into both nostrils 2 (two) times daily. Use in each nostril as directed 30 mL 12  . cloNIDine (CATAPRES) 0.1 MG tablet Take by mouth.    . diclofenac sodium (VOLTAREN) 1 % GEL Apply 2 g 4  (four) times daily topically. 100 g 0  . fluticasone (FLONASE) 50 MCG/ACT nasal spray USE TWO SPRAY(S) IN EACH NOSTRIL ONCE DAILY 16 g 5  . fluticasone-salmeterol (ADVAIR HFA) 115-21 MCG/ACT inhaler Inhale 1 puff into the lungs 2 (two) times daily. 12 g 5  . gabapentin (NEURONTIN) 800 MG tablet Take 1 tablet (800 mg total) by mouth 3 (three) times daily. 90 tablet 3  . losartan (COZAAR) 25 MG tablet Take 1 tablet (25 mg total) by mouth 2 (two) times daily. 180 tablet 1  . methocarbamol (ROBAXIN) 500 MG tablet Take 1 tablet (500 mg total) by mouth 3 (three) times daily. 90 tablet 1  . metoprolol succinate (TOPROL-XL) 25 MG 24 hr tablet Take 3 tablets (75 mg total) by mouth daily. Please make overdue appt with [place] before anymore refills. 2nd attempt 45 tablet 0  . Multiple Vitamin (THERA) TABS Take by mouth.    . Omega-3 Fatty Acids (FISH OIL) 1000 MG CAPS Take 1 capsule by mouth daily.    . pantoprazole (PROTONIX) 40 MG tablet Take 1 tablet (40 mg total) by mouth daily. 30 tablet 3  . polyethylene glycol powder (GLYCOLAX/MIRALAX) powder Take 17 g by mouth 2 (two) times daily as needed. 3350 g 1  . Probiotic Product (PROBIOTIC ADVANCED PO)  Take 1 tablet by mouth daily.    . promethazine (PHENERGAN) 12.5 MG tablet Take 1 tablet (12.5 mg total) by mouth every 6 (six) hours as needed for nausea. 20 tablet 0  . ranitidine (ZANTAC) 150 MG tablet Take 150 mg by mouth at bedtime.    . rosuvastatin (CRESTOR) 20 MG tablet TAKE ONE TABLET BY MOUTH EVERY EVENING. Please make overdue appt with Dr. Irish Lack before anymore refills. 2nd attempt 15 tablet 0  . temazepam (RESTORIL) 15 MG capsule Take 2 capsules by mouth at bedtime.    . traZODone (DESYREL) 50 MG tablet     . zolpidem (AMBIEN) 10 MG tablet Take 2 tablets by mouth at bedtime.      No current facility-administered medications on file prior to visit.     Past Surgical History:  Procedure Laterality Date  . COCHLEAR IMPLANT  2010   left  .  [place]   left  . KIDNEY STONE SURGERY  ~ 2006  . TONSILLECTOMY     "as a child"  . WISDOM TOOTH EXTRACTION      Allergies  Allergen Reactions  . Sulfa Antibiotics Hives  . Famciclovir Other (See Comments)  . Metronidazole Nausea And Vomiting  . Soma [Carisoprodol] Other (See Comments)    Moody and sleepy Cognitive Function, Daytime sleepiness  . Acyclovir   . Acyclovir And Related   . Benadryl [Diphenhydramine Hcl]     Severe emotional reaction and doesn't work well with Pt. Past history  . Cephalosporins Hives  . Codeine Other (See Comments)    Makes patient feel odd ; "sometimes mild; sometimes severe reaction; mostly severe"  . Cyclobenzaprine Other (See Comments)    Muscle Aches.  . Diclofenac Sodium Nausea And Vomiting  . Hydrocodone-Acetaminophen   . Linzess [Linaclotide] Diarrhea  . Nitrofurantoin Monohyd Macro     Felt funny  . Prednisone Other (See Comments)    migraine  . Tizanidine     Other reaction(s): Other (See Comments) insomnia  . Baclofen Nausea Only  . Meloxicam     Other reaction(s): Confusion  . Sulfamethoxazole Rash    Social History   Socioeconomic History  . Marital status: Married    Spouse name: Not on file  . Number of children: 3  . Years of education: 2 yrs coll  . Highest education level: Not on file  Occupational History  . Occupation: Disabled  Social Needs  . Financial resource strain: Not on file  . Food insecurity:    Worry: Not on file    Inability: Not on file  . Transportation needs:    Medical: Not on file    Non-medical: Not on file  Tobacco Use  . Smoking status: Former Smoker    Packs/day: 1.00    Years: 20.00    Pack years: 20.00    Types: Cigarettes    Last attempt to quit: 02/29/2004    Years since quitting: 13.8  . Smokeless tobacco: Never Used  Substance and Sexual Activity  . Alcohol use: Yes    Alcohol/week: 0.0 standard drinks    Comment: "glass of wine q once in awhile; not  very often; do it on special occasion"  . Drug use: No  . Sexual activity: Never  Lifestyle  . Physical activity:    Days per week: Not on file    Minutes per session: Not on file  . Stress: Not on file  Relationships  . Social connections:  Talks on phone: Not on file    Gets together: Not on file    Attends religious service: Not on file    Active member of club or organization: Not on file    Attends meetings of clubs or organizations: Not on file    Relationship status: Not on file  . Intimate partner violence:    Fear of current or ex partner: Not on file    Emotionally abused: Not on file    Physically abused: Not on file    Forced sexual activity: Not on file  Other Topics Concern  . Not on file  Social History Narrative   Completed 2 years of college.    On disability.   Lives at home with her husband.   No caffeine use.   Left-handed.   Three children, 2 biological - 1 adopted.    Family History  Problem Relation Age of Onset  . Heart attack Father 90       Living  . Arthritis Father   . Skin cancer Father   . Hypertension Father   . Hyperlipidemia Father   . Leukemia Mother 63       Deceased  . Alzheimer's disease Paternal Aunt        X2  . Stomach cancer Paternal Aunt        x1  . Arthritis/Rheumatoid Paternal Aunt        x1  . Neuropathy Brother        Peripheal  . Fibromyalgia Sister   . HIV Sister   . Arthritis/Rheumatoid Sister   . Diabetes Paternal Grandmother     BP 131/83   Pulse 64   Ht 5\' 4"  (1.626 m)   Wt 193 lb (87.5 kg)   BMI 33.13 kg/m   Review of Systems: See HPI above.     Objective:  Physical Exam:  Gen: NAD, comfortable in exam room  Upper back: No deformity. FROM with 5/5 strength but pain on trunk rotation. TTP bilateral thoracic paraspinal regions. NVI distally.  Low back: No gross deformity. TTP L > R paraspinal lumbar regions.  No bony TTP. Extension 5 degrees, flexion 30 degrees, rotation limited  bilaterally to 15 degrees. Strength LEs 5/5 all muscle groups.   2+ MSRs in patellar and achilles tendons, equal bilaterally. Negative SLRs. Sensation intact to light touch bilaterally. Negative logroll bilateral hips  Right shoulder: No swelling, ecchymoses.  No gross deformity. TTP posterior shoulder. ROM limited to 90 degrees flexion and abduction, improved.  Full ER. Positive Hawkins, Neers. Negative Yergasons. Strength 4/5 with empty can and 5/5 resisted internal/external rotation. NV intact distally.   Assessment & Plan:  1. Back pain - 2/2 thoracic and lumbar strains.  Tylenol, topical aspercreme, voltaren gel.  Aleve.  Continue gabapentin.  Continue norflex from urgent care.  Radiographs of thoracic and lumbar spine negative though not available for review.  F/u in 2 weeks.  Start physical therapy - begin with modalities for now.  2. Shoulder pain - 2/2 rotator cuff strain.  Tylenol, topical aspercreme, voltaren gel.  Aleve.  Continue gabapentin.   Physical therapy and home exercises.

## 2018-01-18 NOTE — Patient Instructions (Signed)
You have thoracic, lumbar strains and are still dealing with your rotator cuff strain. Tylenol 500mg  1-2 tabs three times a day. Topical aspercreme or voltaren gel up to 4 times a day. Aleve 2 tabs twice a day with food for pain and inflammation. Continue your gabapentin. Continue the norflex as needed. Start physical therapy for modalities only for now - as you improve you can ask them to add in the stretching and strengthening exercises. Move your follow-up with me to 2 weeks from now.

## 2018-01-19 DIAGNOSIS — M545 Low back pain: Secondary | ICD-10-CM | POA: Diagnosis not present

## 2018-01-19 DIAGNOSIS — R2689 Other abnormalities of gait and mobility: Secondary | ICD-10-CM | POA: Diagnosis not present

## 2018-01-19 DIAGNOSIS — R293 Abnormal posture: Secondary | ICD-10-CM | POA: Diagnosis not present

## 2018-01-22 DIAGNOSIS — R293 Abnormal posture: Secondary | ICD-10-CM | POA: Diagnosis not present

## 2018-01-22 DIAGNOSIS — M545 Low back pain: Secondary | ICD-10-CM | POA: Diagnosis not present

## 2018-01-22 DIAGNOSIS — R2689 Other abnormalities of gait and mobility: Secondary | ICD-10-CM | POA: Diagnosis not present

## 2018-01-23 ENCOUNTER — Ambulatory Visit: Payer: 59 | Admitting: Family Medicine

## 2018-01-23 DIAGNOSIS — M545 Low back pain: Secondary | ICD-10-CM | POA: Diagnosis not present

## 2018-01-23 DIAGNOSIS — R293 Abnormal posture: Secondary | ICD-10-CM | POA: Diagnosis not present

## 2018-01-23 DIAGNOSIS — R2689 Other abnormalities of gait and mobility: Secondary | ICD-10-CM | POA: Diagnosis not present

## 2018-01-26 DIAGNOSIS — R2689 Other abnormalities of gait and mobility: Secondary | ICD-10-CM | POA: Diagnosis not present

## 2018-01-26 DIAGNOSIS — M545 Low back pain: Secondary | ICD-10-CM | POA: Diagnosis not present

## 2018-01-26 DIAGNOSIS — R293 Abnormal posture: Secondary | ICD-10-CM | POA: Diagnosis not present

## 2018-01-28 DIAGNOSIS — M791 Myalgia, unspecified site: Secondary | ICD-10-CM | POA: Diagnosis not present

## 2018-01-30 DIAGNOSIS — M545 Low back pain: Secondary | ICD-10-CM | POA: Diagnosis not present

## 2018-01-30 DIAGNOSIS — R2689 Other abnormalities of gait and mobility: Secondary | ICD-10-CM | POA: Diagnosis not present

## 2018-01-30 DIAGNOSIS — R293 Abnormal posture: Secondary | ICD-10-CM | POA: Diagnosis not present

## 2018-01-31 DIAGNOSIS — M546 Pain in thoracic spine: Secondary | ICD-10-CM | POA: Diagnosis not present

## 2018-02-01 ENCOUNTER — Ambulatory Visit: Payer: 59 | Admitting: Family Medicine

## 2018-02-01 DIAGNOSIS — M7918 Myalgia, other site: Secondary | ICD-10-CM | POA: Diagnosis not present

## 2018-02-01 DIAGNOSIS — R293 Abnormal posture: Secondary | ICD-10-CM | POA: Diagnosis not present

## 2018-02-01 DIAGNOSIS — M5134 Other intervertebral disc degeneration, thoracic region: Secondary | ICD-10-CM | POA: Diagnosis not present

## 2018-02-01 DIAGNOSIS — G894 Chronic pain syndrome: Secondary | ICD-10-CM | POA: Diagnosis not present

## 2018-02-01 DIAGNOSIS — M545 Low back pain: Secondary | ICD-10-CM | POA: Diagnosis not present

## 2018-02-01 DIAGNOSIS — M5414 Radiculopathy, thoracic region: Secondary | ICD-10-CM | POA: Diagnosis not present

## 2018-02-01 DIAGNOSIS — R2689 Other abnormalities of gait and mobility: Secondary | ICD-10-CM | POA: Diagnosis not present

## 2018-02-06 DIAGNOSIS — R293 Abnormal posture: Secondary | ICD-10-CM | POA: Diagnosis not present

## 2018-02-06 DIAGNOSIS — M545 Low back pain: Secondary | ICD-10-CM | POA: Diagnosis not present

## 2018-02-06 DIAGNOSIS — R2689 Other abnormalities of gait and mobility: Secondary | ICD-10-CM | POA: Diagnosis not present

## 2018-02-08 DIAGNOSIS — R2689 Other abnormalities of gait and mobility: Secondary | ICD-10-CM | POA: Diagnosis not present

## 2018-02-08 DIAGNOSIS — M545 Low back pain: Secondary | ICD-10-CM | POA: Diagnosis not present

## 2018-02-08 DIAGNOSIS — R293 Abnormal posture: Secondary | ICD-10-CM | POA: Diagnosis not present

## 2018-02-13 DIAGNOSIS — M545 Low back pain: Secondary | ICD-10-CM | POA: Diagnosis not present

## 2018-02-13 DIAGNOSIS — R61 Generalized hyperhidrosis: Secondary | ICD-10-CM | POA: Diagnosis not present

## 2018-02-13 DIAGNOSIS — R293 Abnormal posture: Secondary | ICD-10-CM | POA: Diagnosis not present

## 2018-02-13 DIAGNOSIS — E7800 Pure hypercholesterolemia, unspecified: Secondary | ICD-10-CM | POA: Diagnosis not present

## 2018-02-13 DIAGNOSIS — R5383 Other fatigue: Secondary | ICD-10-CM | POA: Diagnosis not present

## 2018-02-13 DIAGNOSIS — R2689 Other abnormalities of gait and mobility: Secondary | ICD-10-CM | POA: Diagnosis not present

## 2018-02-13 DIAGNOSIS — J45909 Unspecified asthma, uncomplicated: Secondary | ICD-10-CM | POA: Diagnosis not present

## 2018-02-13 DIAGNOSIS — R739 Hyperglycemia, unspecified: Secondary | ICD-10-CM | POA: Diagnosis not present

## 2018-02-15 DIAGNOSIS — R293 Abnormal posture: Secondary | ICD-10-CM | POA: Diagnosis not present

## 2018-02-15 DIAGNOSIS — M545 Low back pain: Secondary | ICD-10-CM | POA: Diagnosis not present

## 2018-02-15 DIAGNOSIS — R2689 Other abnormalities of gait and mobility: Secondary | ICD-10-CM | POA: Diagnosis not present

## 2018-02-16 DIAGNOSIS — R293 Abnormal posture: Secondary | ICD-10-CM | POA: Diagnosis not present

## 2018-02-16 DIAGNOSIS — R2689 Other abnormalities of gait and mobility: Secondary | ICD-10-CM | POA: Diagnosis not present

## 2018-02-16 DIAGNOSIS — M545 Low back pain: Secondary | ICD-10-CM | POA: Diagnosis not present

## 2018-02-26 DIAGNOSIS — G894 Chronic pain syndrome: Secondary | ICD-10-CM | POA: Diagnosis not present

## 2018-02-27 DIAGNOSIS — R293 Abnormal posture: Secondary | ICD-10-CM | POA: Diagnosis not present

## 2018-02-27 DIAGNOSIS — R2689 Other abnormalities of gait and mobility: Secondary | ICD-10-CM | POA: Diagnosis not present

## 2018-02-27 DIAGNOSIS — M545 Low back pain: Secondary | ICD-10-CM | POA: Diagnosis not present

## 2018-02-28 DIAGNOSIS — M791 Myalgia, unspecified site: Secondary | ICD-10-CM | POA: Diagnosis not present

## 2018-03-01 DIAGNOSIS — M545 Low back pain: Secondary | ICD-10-CM | POA: Diagnosis not present

## 2018-03-01 DIAGNOSIS — R293 Abnormal posture: Secondary | ICD-10-CM | POA: Diagnosis not present

## 2018-03-01 DIAGNOSIS — R300 Dysuria: Secondary | ICD-10-CM | POA: Diagnosis not present

## 2018-03-01 DIAGNOSIS — R2689 Other abnormalities of gait and mobility: Secondary | ICD-10-CM | POA: Diagnosis not present

## 2018-03-01 DIAGNOSIS — R159 Full incontinence of feces: Secondary | ICD-10-CM | POA: Diagnosis not present

## 2018-03-01 DIAGNOSIS — N390 Urinary tract infection, site not specified: Secondary | ICD-10-CM | POA: Diagnosis not present

## 2018-03-01 DIAGNOSIS — R32 Unspecified urinary incontinence: Secondary | ICD-10-CM | POA: Diagnosis not present

## 2018-03-06 DIAGNOSIS — R2689 Other abnormalities of gait and mobility: Secondary | ICD-10-CM | POA: Diagnosis not present

## 2018-03-06 DIAGNOSIS — M545 Low back pain: Secondary | ICD-10-CM | POA: Diagnosis not present

## 2018-03-06 DIAGNOSIS — R293 Abnormal posture: Secondary | ICD-10-CM | POA: Diagnosis not present

## 2018-03-07 DIAGNOSIS — N390 Urinary tract infection, site not specified: Secondary | ICD-10-CM | POA: Diagnosis not present

## 2018-03-07 DIAGNOSIS — M5412 Radiculopathy, cervical region: Secondary | ICD-10-CM | POA: Diagnosis not present

## 2018-03-07 DIAGNOSIS — N3946 Mixed incontinence: Secondary | ICD-10-CM | POA: Diagnosis not present

## 2018-03-07 DIAGNOSIS — N398 Other specified disorders of urinary system: Secondary | ICD-10-CM | POA: Diagnosis not present

## 2018-03-07 DIAGNOSIS — N816 Rectocele: Secondary | ICD-10-CM | POA: Diagnosis not present

## 2018-03-09 DIAGNOSIS — R293 Abnormal posture: Secondary | ICD-10-CM | POA: Diagnosis not present

## 2018-03-09 DIAGNOSIS — M545 Low back pain: Secondary | ICD-10-CM | POA: Diagnosis not present

## 2018-03-09 DIAGNOSIS — R2689 Other abnormalities of gait and mobility: Secondary | ICD-10-CM | POA: Diagnosis not present

## 2018-03-13 DIAGNOSIS — R2689 Other abnormalities of gait and mobility: Secondary | ICD-10-CM | POA: Diagnosis not present

## 2018-03-13 DIAGNOSIS — M545 Low back pain: Secondary | ICD-10-CM | POA: Diagnosis not present

## 2018-03-13 DIAGNOSIS — R293 Abnormal posture: Secondary | ICD-10-CM | POA: Diagnosis not present

## 2018-03-16 DIAGNOSIS — R293 Abnormal posture: Secondary | ICD-10-CM | POA: Diagnosis not present

## 2018-03-16 DIAGNOSIS — M545 Low back pain: Secondary | ICD-10-CM | POA: Diagnosis not present

## 2018-03-16 DIAGNOSIS — R2689 Other abnormalities of gait and mobility: Secondary | ICD-10-CM | POA: Diagnosis not present

## 2018-03-22 DIAGNOSIS — M545 Low back pain: Secondary | ICD-10-CM | POA: Diagnosis not present

## 2018-03-22 DIAGNOSIS — R2689 Other abnormalities of gait and mobility: Secondary | ICD-10-CM | POA: Diagnosis not present

## 2018-03-22 DIAGNOSIS — R293 Abnormal posture: Secondary | ICD-10-CM | POA: Diagnosis not present

## 2018-03-23 DIAGNOSIS — Z87891 Personal history of nicotine dependence: Secondary | ICD-10-CM | POA: Diagnosis not present

## 2018-03-23 DIAGNOSIS — B9789 Other viral agents as the cause of diseases classified elsewhere: Secondary | ICD-10-CM | POA: Diagnosis not present

## 2018-03-23 DIAGNOSIS — J069 Acute upper respiratory infection, unspecified: Secondary | ICD-10-CM | POA: Diagnosis not present

## 2018-03-24 DIAGNOSIS — R159 Full incontinence of feces: Secondary | ICD-10-CM | POA: Diagnosis not present

## 2018-03-24 DIAGNOSIS — M5126 Other intervertebral disc displacement, lumbar region: Secondary | ICD-10-CM | POA: Diagnosis not present

## 2018-03-26 DIAGNOSIS — M5134 Other intervertebral disc degeneration, thoracic region: Secondary | ICD-10-CM | POA: Diagnosis not present

## 2018-03-26 DIAGNOSIS — G894 Chronic pain syndrome: Secondary | ICD-10-CM | POA: Diagnosis not present

## 2018-03-26 DIAGNOSIS — M5414 Radiculopathy, thoracic region: Secondary | ICD-10-CM | POA: Diagnosis not present

## 2018-03-27 DIAGNOSIS — M545 Low back pain: Secondary | ICD-10-CM | POA: Diagnosis not present

## 2018-03-27 DIAGNOSIS — M546 Pain in thoracic spine: Secondary | ICD-10-CM | POA: Diagnosis not present

## 2018-03-27 DIAGNOSIS — M47814 Spondylosis without myelopathy or radiculopathy, thoracic region: Secondary | ICD-10-CM | POA: Diagnosis not present

## 2018-03-28 DIAGNOSIS — M545 Low back pain: Secondary | ICD-10-CM | POA: Diagnosis not present

## 2018-03-28 DIAGNOSIS — H04123 Dry eye syndrome of bilateral lacrimal glands: Secondary | ICD-10-CM | POA: Diagnosis not present

## 2018-03-28 DIAGNOSIS — R293 Abnormal posture: Secondary | ICD-10-CM | POA: Diagnosis not present

## 2018-03-28 DIAGNOSIS — R2689 Other abnormalities of gait and mobility: Secondary | ICD-10-CM | POA: Diagnosis not present

## 2018-03-28 DIAGNOSIS — H01001 Unspecified blepharitis right upper eyelid: Secondary | ICD-10-CM | POA: Diagnosis not present

## 2018-03-28 DIAGNOSIS — H26493 Other secondary cataract, bilateral: Secondary | ICD-10-CM | POA: Diagnosis not present

## 2018-03-31 DIAGNOSIS — M791 Myalgia, unspecified site: Secondary | ICD-10-CM | POA: Diagnosis not present

## 2018-04-05 DIAGNOSIS — R2689 Other abnormalities of gait and mobility: Secondary | ICD-10-CM | POA: Diagnosis not present

## 2018-04-05 DIAGNOSIS — M545 Low back pain: Secondary | ICD-10-CM | POA: Diagnosis not present

## 2018-04-05 DIAGNOSIS — R293 Abnormal posture: Secondary | ICD-10-CM | POA: Diagnosis not present

## 2018-04-10 DIAGNOSIS — M5136 Other intervertebral disc degeneration, lumbar region: Secondary | ICD-10-CM | POA: Diagnosis not present

## 2018-04-10 DIAGNOSIS — R42 Dizziness and giddiness: Secondary | ICD-10-CM | POA: Diagnosis not present

## 2018-04-10 DIAGNOSIS — M79604 Pain in right leg: Secondary | ICD-10-CM | POA: Diagnosis not present

## 2018-04-10 DIAGNOSIS — M79605 Pain in left leg: Secondary | ICD-10-CM | POA: Diagnosis not present

## 2018-04-10 DIAGNOSIS — Z79899 Other long term (current) drug therapy: Secondary | ICD-10-CM | POA: Diagnosis not present

## 2018-04-15 DIAGNOSIS — R51 Headache: Secondary | ICD-10-CM | POA: Diagnosis not present

## 2018-04-15 DIAGNOSIS — R42 Dizziness and giddiness: Secondary | ICD-10-CM | POA: Diagnosis not present

## 2018-04-15 DIAGNOSIS — Z7982 Long term (current) use of aspirin: Secondary | ICD-10-CM | POA: Diagnosis not present

## 2018-04-23 DIAGNOSIS — R2689 Other abnormalities of gait and mobility: Secondary | ICD-10-CM | POA: Diagnosis not present

## 2018-04-23 DIAGNOSIS — M545 Low back pain: Secondary | ICD-10-CM | POA: Diagnosis not present

## 2018-04-23 DIAGNOSIS — R293 Abnormal posture: Secondary | ICD-10-CM | POA: Diagnosis not present

## 2018-04-25 DIAGNOSIS — R293 Abnormal posture: Secondary | ICD-10-CM | POA: Diagnosis not present

## 2018-04-25 DIAGNOSIS — R2689 Other abnormalities of gait and mobility: Secondary | ICD-10-CM | POA: Diagnosis not present

## 2018-04-25 DIAGNOSIS — M545 Low back pain: Secondary | ICD-10-CM | POA: Diagnosis not present

## 2018-04-29 DIAGNOSIS — M791 Myalgia, unspecified site: Secondary | ICD-10-CM | POA: Diagnosis not present

## 2018-05-01 DIAGNOSIS — R2689 Other abnormalities of gait and mobility: Secondary | ICD-10-CM | POA: Diagnosis not present

## 2018-05-01 DIAGNOSIS — R293 Abnormal posture: Secondary | ICD-10-CM | POA: Diagnosis not present

## 2018-05-01 DIAGNOSIS — M545 Low back pain: Secondary | ICD-10-CM | POA: Diagnosis not present

## 2018-05-03 DIAGNOSIS — M545 Low back pain: Secondary | ICD-10-CM | POA: Diagnosis not present

## 2018-05-03 DIAGNOSIS — R2689 Other abnormalities of gait and mobility: Secondary | ICD-10-CM | POA: Diagnosis not present

## 2018-05-03 DIAGNOSIS — R293 Abnormal posture: Secondary | ICD-10-CM | POA: Diagnosis not present

## 2018-05-08 DIAGNOSIS — R2689 Other abnormalities of gait and mobility: Secondary | ICD-10-CM | POA: Diagnosis not present

## 2018-05-08 DIAGNOSIS — M545 Low back pain: Secondary | ICD-10-CM | POA: Diagnosis not present

## 2018-05-08 DIAGNOSIS — R293 Abnormal posture: Secondary | ICD-10-CM | POA: Diagnosis not present

## 2018-05-14 DIAGNOSIS — Z86010 Personal history of colonic polyps: Secondary | ICD-10-CM | POA: Diagnosis not present

## 2018-05-14 DIAGNOSIS — R634 Abnormal weight loss: Secondary | ICD-10-CM | POA: Diagnosis not present

## 2018-05-14 DIAGNOSIS — K219 Gastro-esophageal reflux disease without esophagitis: Secondary | ICD-10-CM | POA: Diagnosis not present

## 2018-05-15 DIAGNOSIS — M545 Low back pain: Secondary | ICD-10-CM | POA: Diagnosis not present

## 2018-05-15 DIAGNOSIS — I10 Essential (primary) hypertension: Secondary | ICD-10-CM | POA: Diagnosis not present

## 2018-05-15 DIAGNOSIS — R2689 Other abnormalities of gait and mobility: Secondary | ICD-10-CM | POA: Diagnosis not present

## 2018-05-15 DIAGNOSIS — R293 Abnormal posture: Secondary | ICD-10-CM | POA: Diagnosis not present

## 2018-05-15 DIAGNOSIS — G43909 Migraine, unspecified, not intractable, without status migrainosus: Secondary | ICD-10-CM | POA: Diagnosis not present

## 2018-05-15 DIAGNOSIS — M5136 Other intervertebral disc degeneration, lumbar region: Secondary | ICD-10-CM | POA: Diagnosis not present

## 2018-05-22 DIAGNOSIS — R634 Abnormal weight loss: Secondary | ICD-10-CM | POA: Insufficient documentation

## 2018-05-22 DIAGNOSIS — F32A Depression, unspecified: Secondary | ICD-10-CM | POA: Insufficient documentation

## 2018-05-22 DIAGNOSIS — F339 Major depressive disorder, recurrent, unspecified: Secondary | ICD-10-CM | POA: Insufficient documentation

## 2018-05-22 DIAGNOSIS — F329 Major depressive disorder, single episode, unspecified: Secondary | ICD-10-CM | POA: Insufficient documentation

## 2018-05-22 DIAGNOSIS — M479 Spondylosis, unspecified: Secondary | ICD-10-CM | POA: Insufficient documentation

## 2018-05-22 DIAGNOSIS — M47819 Spondylosis without myelopathy or radiculopathy, site unspecified: Secondary | ICD-10-CM | POA: Insufficient documentation

## 2018-05-22 DIAGNOSIS — F5231 Female orgasmic disorder: Secondary | ICD-10-CM | POA: Insufficient documentation

## 2018-05-22 DIAGNOSIS — K5792 Diverticulitis of intestine, part unspecified, without perforation or abscess without bleeding: Secondary | ICD-10-CM | POA: Insufficient documentation

## 2018-05-23 DIAGNOSIS — R1032 Left lower quadrant pain: Secondary | ICD-10-CM | POA: Diagnosis not present

## 2018-05-28 ENCOUNTER — Telehealth: Payer: Self-pay | Admitting: Neurology

## 2018-05-28 NOTE — Telephone Encounter (Signed)
I have called twice today, the answer machine picks up immediately, unable to leave a message because the machine is full.  I will call back again.

## 2018-05-28 NOTE — Telephone Encounter (Signed)
Pt is wanting to speak with Dr [name]. She said the message was too long to leave. Please call to advise

## 2018-05-28 NOTE — Telephone Encounter (Signed)
I called back a third time, unable to leave a message or talk to the patient, I will call back tomorrow morning.

## 2018-05-29 NOTE — Telephone Encounter (Signed)
I called the fourth time, the telephone goes directly to answering machine, answering machine is full, unable to leave a message.  I will need to wait for the patient to contact our office again, she will need to leave a number where she can be reached.

## 2018-06-20 ENCOUNTER — Other Ambulatory Visit: Payer: Self-pay

## 2018-06-20 ENCOUNTER — Telehealth: Payer: 59 | Admitting: Family Medicine

## 2018-06-21 ENCOUNTER — Telehealth: Payer: Self-pay | Admitting: Registered Nurse

## 2018-06-21 NOTE — Telephone Encounter (Signed)
06/20/2018 - PATIENT HAD A TELEMED SCHEDULED WITH DR. Benay Spice ON Wednesday 06/20/2018. SHE WAS A NO-SHOW. I CALLED TO RESCHEDULE HER. SHE WANTED TO BE SEEN FOR MIGRAINES BUT SHE REALLY NEEDED A PRIMARY CARE PROVIDER. I SCHEDULED HER FOR Monday 06/25/18 WITH RICH MORROW. IT REALLY SHOULD HAVE BEEN SCHEDULED FOR Monday AFTERNOON INSTEAD OF Monday MORNING. I CALLED AND LEFT HER A MESSAGE TO CALL BACK BUT SHE DIDN'T. WHEN I LOOKED AT RICH'S SCHEDULE ON THURS. 06/21/18 TO SEE IF HE STILL HAD THE 3:00pm, IT WAS ALREADY TAKEN. WE WILL JUST KEEP IT LIKE IT IS. [place]

## 2018-06-25 ENCOUNTER — Other Ambulatory Visit: Payer: Self-pay

## 2018-06-25 ENCOUNTER — Encounter: Payer: Self-pay | Admitting: Registered Nurse

## 2018-06-25 ENCOUNTER — Telehealth (INDEPENDENT_AMBULATORY_CARE_PROVIDER_SITE_OTHER): Payer: 59 | Admitting: Registered Nurse

## 2018-06-25 VITALS — Wt 159.0 lb

## 2018-06-25 DIAGNOSIS — G43009 Migraine without aura, not intractable, without status migrainosus: Secondary | ICD-10-CM

## 2018-06-25 NOTE — Progress Notes (Signed)
Pt states she has been having migraines x 4 months and would like to discuss a medication to help with her migrains.

## 2018-06-25 NOTE — Progress Notes (Signed)
Telemedicine Encounter- SOAP NOTE Established Patient  This telephone encounter was conducted with the patient's (or proxy's) verbal consent via audio telecommunications: yes Patient was instructed to have this encounter in a suitably private space; and to only have persons present to whom they give permission to participate. In addition, patient identity was confirmed by use of name plus two identifiers (DOB and address).  I discussed the limitations, risks, security and privacy concerns of performing an evaluation and management service by telephone and the availability of in person appointments. I also discussed with the patient that there may be a patient responsible charge related to this service. The patient expressed understanding and agreed to proceed.  I spent a total of 15 minutes talking with the patient or their proxy.  CC: Migraine symptoms  Subjective   Jasmine [name] is a 63 y.o. established patient. Telephone visit today for migraines  Pt states migraines started around 4 months prior to visit. She notes sharp, unilateral pain in R side of head. She reports that these happen roughly 5-7 times each month. She states that episodes last 2-4 hours. She states that light and noise have been aggravating factors. She has found rest and ice applied to her head to alleviate her pain.   She has a hx significant for degenerative changes in her cervical spine and chronic neck pain.  She reports that around the time of the onset of these headaches, she was on a medication that caused her to have intense episodes of vomiting that exacerbated existing pain in her neck and back. The headaches have been regularly occurring since.  She reports that she has had similar headaches in the past, between 15-20 years prior to today's visit. She states that at that time, she received "injections" into her neck from her provider, though she could not remember what medication was injected. These  provided relief.   She does not regularly monitor her blood pressure at home. She reports mild blurry vision during headache episodes. Denies worsening symptoms. Denies temporal pain.     Patient Active Problem List   Diagnosis Date Noted  . Anorgasmia of female 05/22/2018  . Abnormal weight loss 05/22/2018  . Degeneration of spine 05/22/2018  . Depressive disorder 05/22/2018  . Diverticulitis 05/22/2018  . Achilles tendinosis of left lower extremity 01/23/2017  . Cervicogenic headache 08/24/2016  . Pain of toe of left foot 05/19/2016  . Left shoulder pain 05/11/2016  . Dry eye syndrome of both lacrimal glands 04/27/2016  . Hyperlipidemia 02/01/2016  . Benign paroxysmal positional vertigo 01/05/2016  . Fibromyalgia 10/31/2015  . Migraine 08/07/2015  . Neck pain 08/07/2015  . Myalgia and myositis 03/04/2015  . Bilateral sensorineural hearing loss 12/22/2014  . DJD (degenerative joint disease) of knee 12/09/2014  . Essential hypertension 12/08/2014  . Bradycardia 09/22/2014  . Hypercalcemia 09/22/2014  . Osteoporosis 12/23/2013  . DJD (degenerative joint disease) 08/19/2013  . Renal cyst 05/12/2013  . Obesity 09/19/2012  . Chronic low back pain 11/30/2011  . Schizophrenia ([place]) 07/14/2011  . Insomnia 09/22/2010  . Perennial allergic rhinitis with seasonal variation 09/22/2010  . Mild intermittent asthma without complication 85/27/7824  . Hypercholesteremia 09/22/2010    Past Medical History:  Diagnosis Date  . Anxiety   . Asthma   . Cervicogenic headache 08/24/2016  . Chronic high back pain   . Double vision   . Fibromyalgia   . Heart murmur   . High cholesterol   . Hypertension   .  Hypoglycemia   . Kidney stone   . Meniscus tear    Right  . Migraine 08/07/2015  . PTSD (post-traumatic stress disorder)   . Schizophrenia ([place])    treated by Dr. Casimiro Needle  . Seizures ([place])    last seizure 3 years ago, not on AED  . Stroke ([place]) 07/08/11   "I've had mini strokes  before; left side of face  is more down than right "  . Syncope and collapse    One episode - 07/10/15    Current Outpatient Medications  Medication Sig Dispense Refill  . albuterol (PROVENTIL HFA;VENTOLIN HFA) 108 (90 Base) MCG/ACT inhaler Inhale 2 puffs into the lungs every 6 (six) hours as needed for wheezing or shortness of breath. 1 Inhaler 2  . azelastine (ASTELIN) 0.1 % nasal spray Place 1 spray into both nostrils 2 (two) times daily. Use in each nostril as directed 30 mL 12  . cloNIDine (CATAPRES) 0.1 MG tablet Take by mouth.    . fluticasone (FLONASE) 50 MCG/ACT nasal spray USE TWO SPRAY(S) IN EACH NOSTRIL ONCE DAILY 16 g 5  . fluticasone-salmeterol (ADVAIR HFA) 115-21 MCG/ACT inhaler Inhale 1 puff into the lungs 2 (two) times daily. 12 g 5  . gabapentin (NEURONTIN) 800 MG tablet Take 1 tablet (800 mg total) by mouth 3 (three) times daily. 90 tablet 3  . losartan (COZAAR) 25 MG tablet Take 1 tablet (25 mg total) by mouth 2 (two) times daily. 180 tablet 1  . metoprolol succinate (TOPROL-XL) 25 MG 24 hr tablet Take 3 tablets (75 mg total) by mouth daily. Please make overdue appt with [place] before anymore refills. 2nd attempt 45 tablet 0  . Multiple Vitamin (THERA) TABS Take by mouth.    . Omega-3 Fatty Acids (FISH OIL) 1000 MG CAPS Take 1 capsule by mouth daily.    . pantoprazole (PROTONIX) 40 MG tablet Take 1 tablet (40 mg total) by mouth daily. 30 tablet 3  . polyethylene glycol powder (GLYCOLAX/MIRALAX) powder Take 17 g by mouth 2 (two) times daily as needed. 3350 g 1  . Probiotic Product (PROBIOTIC ADVANCED PO) Take 1 tablet by mouth daily.    . promethazine (PHENERGAN) 12.5 MG tablet Take 1 tablet (12.5 mg total) by mouth every 6 (six) hours as needed for nausea. 20 tablet 0  . ranitidine (ZANTAC) 150 MG tablet Take 150 mg by mouth at bedtime.    . rosuvastatin (CRESTOR) 20 MG tablet TAKE ONE TABLET BY MOUTH EVERY EVENING. Please make overdue appt with Dr. Irish Lack before anymore  refills. 2nd attempt 15 tablet 0  . temazepam (RESTORIL) 15 MG capsule Take 2 capsules by mouth at bedtime.    Marland Kitchen zolpidem (AMBIEN) 10 MG tablet Take 2 tablets by mouth at bedtime.      No current facility-administered medications for this visit.     Allergies  Allergen Reactions  . Sulfa Antibiotics Hives  . Famciclovir Other (See Comments)  . Metronidazole Nausea And Vomiting  . Soma [Carisoprodol] Other (See Comments)    Moody and sleepy Cognitive Function, Daytime sleepiness  . Acyclovir   . Acyclovir And Related   . Benadryl [Diphenhydramine Hcl]     Severe emotional reaction and doesn't work well with Pt. Past history  . Cephalosporins Hives  . Codeine Other (See Comments)    Makes patient feel odd ; "sometimes mild; sometimes severe reaction; mostly severe"  . Cyclobenzaprine Other (See Comments)    Muscle Aches.  . Diclofenac Sodium Nausea And Vomiting  .  Hydrocodone-Acetaminophen   . Linzess [Linaclotide] Diarrhea  . Nitrofurantoin Monohyd Macro     Felt funny  . Prednisone Other (See Comments)    migraine  . Tizanidine     Other reaction(s): Other (See Comments) insomnia  . Baclofen Nausea Only  . Meloxicam     Other reaction(s): Confusion  . Sulfamethoxazole Rash    Social History   Socioeconomic History  . Marital status: Married    Spouse name: Not on file  . Number of children: 3  . Years of education: 2 yrs coll  . Highest education level: Not on file  Occupational History  . Occupation: Disabled  Social Needs  . Financial resource strain: Not on file  . Food insecurity:    Worry: Not on file    Inability: Not on file  . Transportation needs:    Medical: Not on file    Non-medical: Not on file  Tobacco Use  . Smoking status: Former Smoker    Packs/day: 1.00    Years: 20.00    Pack years: 20.00    Types: Cigarettes    Last attempt to quit: 02/29/2004    Years since quitting: 14.3  . Smokeless tobacco: Never Used  Substance and Sexual  Activity  . Alcohol use: Yes    Alcohol/week: 0.0 standard drinks    Comment: "glass of wine q once in awhile; not very often; do it on special occasion"  . Drug use: No  . Sexual activity: Never  Lifestyle  . Physical activity:    Days per week: Not on file    Minutes per session: Not on file  . Stress: Not on file  Relationships  . Social connections:    Talks on phone: Not on file    Gets together: Not on file    Attends religious service: Not on file    Active member of club or organization: Not on file    Attends meetings of clubs or organizations: Not on file    Relationship status: Not on file  . Intimate partner violence:    Fear of current or ex partner: Not on file    Emotionally abused: Not on file    Physically abused: Not on file    Forced sexual activity: Not on file  Other Topics Concern  . Not on file  Social History Narrative   Completed 2 years of college.    On disability.   Lives at home with her husband.   No caffeine use.   Left-handed.   Three children, 2 biological - 1 adopted.    Review of Systems  Constitutional: Positive for weight loss. Negative for fever.  HENT: Negative for sore throat.   Eyes: Positive for blurred vision and photophobia. Negative for double vision and pain.  Respiratory: Negative for cough and shortness of breath.   Cardiovascular: Negative for chest pain and palpitations.  Gastrointestinal: Negative for constipation, diarrhea, nausea and vomiting.  Musculoskeletal: Positive for back pain and neck pain.  Neurological: Positive for headaches.    Objective   Vitals as reported by the patient: Today's Vitals   06/25/18 1042  Weight: 159 lb (72.1 kg)    Diagnoses and all orders for this visit:  Migraine without aura and without status migrainosus, not intractable  PLAN  At this time, strong suspicion for Cervicogenic etiology to headaches d/t: hx of DJD in cervical spine, preceding event to exacerbate neck pain,  hx of relief with injections.  Encouraged patient to use  ibuprofen, caffeine, and alternating ice and heat on neck immediately at onset of headache. With hx of bradycardia and use of ambien and restoril, there is hesitation in using a sedative abortive therapy.  Physical therapy will be sought post COVID-19  Pt encouraged to start "headache journal" to assist in determining etiology.   Patient encouraged to call clinic with changing or worsening in symptoms or frequency.    I discussed the assessment and treatment plan with the patient. The patient was provided an opportunity to ask questions and all were answered. The patient agreed with the plan and demonstrated an understanding of the instructions.   The patient was advised to call back or seek an in-person evaluation if the symptoms worsen or if the condition fails to improve as anticipated.  I provided 15 minutes of non-face-to-face time during this encounter.  Maximiano Coss, NP  Primary Care at [facility]

## 2018-06-25 NOTE — Patient Instructions (Signed)
Cervicogenic Headache    A cervicogenic headache is a headache caused by a condition that affects the bones and tissues in your neck (cervical spine). In a cervicogenic headache, the pain moves from your neck to your head. Most cervicogenic headaches start in the upper part of the neck with the first three cervical bones (cervical vertebrae).  A cervicogenic headache is diagnosed when a cause can be found in the cervical spine and other causes of headaches can be ruled out.  What are the causes?  The most common cause of this condition is a traumatic injury to the cervical spine, such as whiplash.  Other causes include:  · Arthritis.  · Broken bone (fracture).  · Infection.  · Tumor.  What are the signs or symptoms?  The most common symptoms are neck and head pain. The pain is often located on one side. In some cases, there may be head pain without neck pain. Pain may be felt in the neck, back or side of the head, face, or behind the eyes.  Other symptoms include:  · Limited movement in the neck.  · Arm or shoulder pain.  How is this diagnosed?  This condition may be diagnosed based on:  · Your symptoms.  · A physical exam.  · An injection that blocks nerve signals (nerve block).  · Imaging tests, such as:  ? X-rays.  ? CT scan.  ? MRI.  How is this treated?  Treatment for this condition may depend on the underlying condition. Treatment may include:  · Medicines, such as:  ? NSAIDs.  ? Muscle relaxants.  · Physical therapy.  · Massage therapy.  · Complementary therapies, such as:  ? Biofeedback.  ? Meditation.  ? Acupuncture.  · Nerve block injections.  · Botulinum toxin injections.  Your treatment plan may involve working with a pain management team that includes your primary health care provider, a pain management specialist, a neurologist, and a physical therapist.  Follow these instructions at home:  · Take over-the-counter and prescription medicines only as told by your health care provider.  · Do exercises at  home as told by your physical therapist.  · Return to your normal activities as told by your health care provider. Ask your health care provider what activities are safe for you. Avoid activities that trigger your headaches.  · Maintain good neck support and posture at home and at work.  · Keep all follow-up visits as told by your health care provider. This is important.  Contact a health care provider if you have:  · Headaches that are getting worse and happening more often.  · Headaches with any of the following:  ? Fever.  ? Numbness.  ? Weakness.  ? Dizziness.  ? Nausea or vomiting.  Get help right away if:  · You have a very sudden and severe headache.  Summary  · A cervicogenic headache is a headache caused by a condition that affects the bones and tissues in your cervical spine.  · Your health care provider may diagnose this condition with a physical exam, a nerve block, and imaging tests.  · Treatment may include medicine to reduce pain and inflammation, physical therapy, and nerve block injections.  · Complementary therapies, such as acupuncture and meditation, may be added to other treatments.  · Your treatment plan may involve working with a pain management team that includes your primary health care provider, a pain management specialist, a neurologist, and a physical therapist.  This 

## 2018-08-03 ENCOUNTER — Ambulatory Visit (INDEPENDENT_AMBULATORY_CARE_PROVIDER_SITE_OTHER): Payer: 59 | Admitting: Registered Nurse

## 2018-08-03 ENCOUNTER — Encounter: Payer: Self-pay | Admitting: Registered Nurse

## 2018-08-03 ENCOUNTER — Other Ambulatory Visit: Payer: Self-pay

## 2018-08-03 VITALS — BP 142/82 | HR 65 | Temp 98.1°F | Resp 18 | Ht 63.98 in | Wt 166.0 lb

## 2018-08-03 DIAGNOSIS — Z1329 Encounter for screening for other suspected endocrine disorder: Secondary | ICD-10-CM | POA: Diagnosis not present

## 2018-08-03 DIAGNOSIS — Z13220 Encounter for screening for lipoid disorders: Secondary | ICD-10-CM

## 2018-08-03 DIAGNOSIS — Z13228 Encounter for screening for other metabolic disorders: Secondary | ICD-10-CM | POA: Diagnosis not present

## 2018-08-03 DIAGNOSIS — M25511 Pain in right shoulder: Secondary | ICD-10-CM | POA: Diagnosis not present

## 2018-08-03 DIAGNOSIS — Z130 Encounter for screening for diseases of the blood and blood-forming organs and certain disorders involving the immune mechanism: Secondary | ICD-10-CM

## 2018-08-03 NOTE — Patient Instructions (Signed)
° ° ° °  If you have lab work done today you will be contacted with your lab results within the next 2 weeks.  If you have not heard from us then please contact us. The fastest way to get your results is to register for My Chart. ° ° °IF you received an x-ray today, you will receive an invoice from [facility]. Please contact [facility] at [phone] with questions or concerns regarding your invoice.  ° °IF you received labwork today, you will receive an invoice from [facility]. Please contact [facility] at [phone] with questions or concerns regarding your invoice.  ° °Our billing staff will not be able to assist you with questions regarding bills from these companies. ° °You will be contacted with the lab results as soon as they are available. The fastest way to get your results is to activate your My Chart account. Instructions are located on the last page of this paperwork. If you have not heard from us regarding the results in 2 weeks, please contact this office. °  ° ° ° °

## 2018-08-03 NOTE — Progress Notes (Signed)
Jasmine [name] is a 63 y.o. female  Shoulder Pain: Patient complaints of right shoulder pain. This is evaluated as a personal injury. The pain is described as sharp pain.  The onset of the pain was 4 mos prior to this visit.  The pain occurs intermittently, mostly when moving joint and when trying to sleep. Symptoms are aggravated by movement. Symptoms are diminisohed by OTC patches.   Limited activities include: flexion, abduction, internal rotation.    Past Medical History:  Diagnosis Date  . Anxiety   . Asthma   . Cervicogenic headache 08/24/2016  . Chronic high back pain   . Double vision   . Fibromyalgia   . Heart murmur   . High cholesterol   . Hypertension   . Hypoglycemia   . Kidney stone   . Meniscus tear    Right  . Migraine 08/07/2015  . PTSD (post-traumatic stress disorder)   . Schizophrenia ([place])    treated by Dr. Casimiro Needle  . Seizures ([place])    last seizure 3 years ago, not on AED  . Stroke ([place]) 07/08/11   "I've had mini strokes before; left side of face  is more down than right "  . Syncope and collapse    One episode - 07/10/15   Past Surgical History:  Procedure Laterality Date  . COCHLEAR IMPLANT  2010   left  . [place]   left  . KIDNEY STONE SURGERY  ~ 2006  . TONSILLECTOMY     "as a child"  . WISDOM TOOTH EXTRACTION      Current Outpatient Medications:  .  albuterol (PROVENTIL HFA;VENTOLIN HFA) 108 (90 Base) MCG/ACT inhaler, Inhale 2 puffs into the lungs every 6 (six) hours as needed for wheezing or shortness of breath., Disp: 1 Inhaler, Rfl: 2 .  azelastine (ASTELIN) 0.1 % nasal spray, Place 1 spray into both nostrils 2 (two) times daily. Use in each nostril as directed, Disp: 30 mL, Rfl: 12 .  cloNIDine (CATAPRES) 0.1 MG tablet, Take by mouth., Disp: , Rfl:  .  fluticasone (FLONASE) 50 MCG/ACT nasal spray, USE TWO SPRAY(S) IN EACH NOSTRIL ONCE DAILY, Disp: 16 g, Rfl: 5 .  fluticasone-salmeterol (ADVAIR HFA) 115-21 MCG/ACT inhaler,  Inhale 1 puff into the lungs 2 (two) times daily., Disp: 12 g, Rfl: 5 .  gabapentin (NEURONTIN) 800 MG tablet, Take 1 tablet (800 mg total) by mouth 3 (three) times daily., Disp: 90 tablet, Rfl: 3 .  losartan (COZAAR) 25 MG tablet, Take 1 tablet (25 mg total) by mouth 2 (two) times daily., Disp: 180 tablet, Rfl: 1 .  metoprolol succinate (TOPROL-XL) 25 MG 24 hr tablet, Take 3 tablets (75 mg total) by mouth daily. Please make overdue appt with [place] before anymore refills. 2nd attempt, Disp: 45 tablet, Rfl: 0 .  Multiple Vitamin (THERA) TABS, Take by mouth., Disp: , Rfl:  .  polyethylene glycol powder (GLYCOLAX/MIRALAX) powder, Take 17 g by mouth 2 (two) times daily as needed., Disp: 3350 g, Rfl: 1 .  Probiotic Product (PROBIOTIC ADVANCED PO), Take 1 tablet by mouth daily., Disp: , Rfl:  .  rosuvastatin (CRESTOR) 20 MG tablet, TAKE ONE TABLET BY MOUTH EVERY EVENING. Please make overdue appt with Dr. Irish Lack before anymore refills. 2nd attempt, Disp: 15 tablet, Rfl: 0 .  temazepam (RESTORIL) 15 MG capsule, Take 2 capsules by mouth at bedtime., Disp: , Rfl:  .  zolpidem (AMBIEN) 10 MG tablet, Take 2 tablets by mouth at bedtime. ,  Disp: , Rfl:  Allergies  Allergen Reactions  . Sulfa Antibiotics Hives  . Famciclovir Other (See Comments)  . Metronidazole Nausea And Vomiting  . Soma [Carisoprodol] Other (See Comments)    Moody and sleepy Cognitive Function, Daytime sleepiness  . Acyclovir   . Acyclovir And Related   . Benadryl [Diphenhydramine Hcl]     Severe emotional reaction and doesn't work well with Pt. Past history  . Cephalosporins Hives  . Codeine Other (See Comments)    Makes patient feel odd ; "sometimes mild; sometimes severe reaction; mostly severe"  . Cyclobenzaprine Other (See Comments)    Muscle Aches.  . Diclofenac Sodium Nausea And Vomiting  . Hydrocodone   . Hydrocodone-Acetaminophen   . Linzess [Linaclotide] Diarrhea  . Nitrofurantoin Monohyd Macro     Felt funny  .  Tizanidine     Other reaction(s): Other (See Comments) insomnia  . Tramadol   . Baclofen Nausea Only  . Meloxicam     Other reaction(s): Confusion  . Sulfamethoxazole Rash    reports that she quit smoking about 14 years ago. Her smoking use included cigarettes. She has a 20.00 pack-year smoking history. She has never used smokeless tobacco. She reports current alcohol use. She reports that she does not use drugs. Family History  Problem Relation Age of Onset  . Heart attack Father 69       Living  . Arthritis Father   . Skin cancer Father   . Hypertension Father   . Hyperlipidemia Father   . Leukemia Mother 72       Deceased  . Alzheimer's disease Paternal Aunt        X2  . Stomach cancer Paternal Aunt        x1  . Arthritis/Rheumatoid Paternal Aunt        x1  . Neuropathy Brother        Peripheal  . Fibromyalgia Sister   . HIV Sister   . Arthritis/Rheumatoid Sister   . Diabetes Paternal Grandmother     Shoulder: Inspection reveals no abnormalities, atrophy or asymmetry. TTP across joint line. ROM is limited, flexion and abduction to only 90 degrees. Rotator cuff strength weakened as compared to opposite shoulder.  Pt has positive Hawkins, Neer, can emptying. Drop arm sign positive.  Orders Placed This Encounter  Procedures  . CBC  . Comprehensive metabolic panel  . Hemoglobin A1c  . Lipid panel  . TSH  . Ambulatory referral to Orthopedics    Referral Priority:   Routine    Referral Type:   Consultation    Referred to Provider:   Susa Day, MD    Number of Visits Requested:   1    PLAN:  Pt originally presenting for CPE - however, unsure of last CPE date and if her ins will cover a CPE. Opting to make this visit about her current shoulder pain as detailed in HPI.  Refer to Ortho - after failing PT, next step to be managed by Ortho. Pt appears to have injury of rotator cuff, though her pain is pretty consistent through most motions involving the  shoulder.  She will receive routine labs today as she is fasting - she will return for CPE and we can follow up on results at that time or sooner. As she is a new patient to my care and has not had labs on file since 2018, I feel it prudent to get these done at this time rather than delay.  Patient  encouraged to call clinic with any questions, comments, or concerns.    Maximiano Coss, NP [facility] [address] [place], [place] [address] [phone] - 0000

## 2018-08-04 LAB — COMPREHENSIVE METABOLIC PANEL
ALT: 10 IU/L (ref 0–32)
AST: 17 IU/L (ref 0–40)
Albumin/Globulin Ratio: 2 (ref 1.2–2.2)
Albumin: 4.2 g/dL (ref 3.8–4.8)
Alkaline Phosphatase: 65 IU/L (ref 39–117)
BUN/Creatinine Ratio: 25 (ref 12–28)
BUN: 16 mg/dL (ref 8–27)
Bilirubin Total: 0.5 mg/dL (ref 0.0–1.2)
CO2: 22 mmol/L (ref 20–29)
Calcium: 9.6 mg/dL (ref 8.7–10.3)
Chloride: 105 mmol/L (ref 96–106)
Creatinine, Ser: 0.64 mg/dL (ref 0.57–1.00)
GFR calc Af Amer: 111 mL/min/{1.73_m2} (ref >59)
GFR calc non Af Amer: 97 mL/min/{1.73_m2} (ref >59)
Globulin, Total: 2.1 g/dL (ref 1.5–4.5)
Glucose: 97 mg/dL (ref 65–99)
Potassium: 4.3 mmol/L (ref 3.5–5.2)
Sodium: 143 mmol/L (ref 134–144)
Total Protein: 6.3 g/dL (ref 6.0–8.5)

## 2018-08-04 LAB — TSH: TSH: 2.35 u[IU]/mL (ref 0.450–4.500)

## 2018-08-04 LAB — CBC
Hematocrit: 41.6 % (ref 34.0–46.6)
Hemoglobin: 13.6 g/dL (ref 11.1–15.9)
MCH: 28.3 pg (ref 26.6–33.0)
MCHC: 32.7 g/dL (ref 31.5–35.7)
MCV: 87 fL (ref 79–97)
Platelets: 235 10*3/uL (ref 150–450)
RBC: 4.8 x10E6/uL (ref 3.77–5.28)
RDW: 12.8 % (ref 11.7–15.4)
WBC: 7.4 10*3/uL (ref 3.4–10.8)

## 2018-08-04 LAB — LIPID PANEL
Chol/HDL Ratio: 2.8 ratio (ref 0.0–4.4)
Cholesterol, Total: 165 mg/dL (ref 100–199)
HDL: 58 mg/dL (ref >39)
LDL Calculated: 97 mg/dL (ref 0–99)
Triglycerides: 50 mg/dL (ref 0–149)
VLDL Cholesterol Cal: 10 mg/dL (ref 5–40)

## 2018-08-04 LAB — HEMOGLOBIN A1C
Est. average glucose Bld gHb Est-mCnc: 120 mg/dL
Hgb A1c MFr Bld: 5.8 % — ABNORMAL HIGH (ref 4.8–5.6)

## 2018-08-15 ENCOUNTER — Telehealth: Payer: Self-pay | Admitting: Registered Nurse

## 2018-08-15 NOTE — Telephone Encounter (Signed)
Patient calling for a medication that she has taken before for her shoulder pain. She does not know how it's spelled.

## 2018-08-15 NOTE — Telephone Encounter (Signed)
Rich,  Do you want to prescribe her anything for her shoulder?

## 2018-08-16 ENCOUNTER — Other Ambulatory Visit: Payer: Self-pay | Admitting: Registered Nurse

## 2018-08-16 DIAGNOSIS — M25511 Pain in right shoulder: Secondary | ICD-10-CM

## 2018-08-16 NOTE — Progress Notes (Signed)
Pt is having ongoing pain in R shoulder. Requesting pain relief, however, given intolerances to a number of analgesics and muscle relaxers, Pt would benefit from management through pain medicine.  Kathrin Ruddy, NP

## 2018-08-22 ENCOUNTER — Telehealth: Payer: Self-pay | Admitting: Registered Nurse

## 2018-08-22 NOTE — Telephone Encounter (Signed)
Spoke with pt to inform her that her appt will be at 7:50. Confirmed

## 2018-09-04 DIAGNOSIS — M75101 Unspecified rotator cuff tear or rupture of right shoulder, not specified as traumatic: Secondary | ICD-10-CM | POA: Insufficient documentation

## 2018-09-12 ENCOUNTER — Ambulatory Visit: Payer: 59 | Admitting: Neurology

## 2018-09-12 ENCOUNTER — Encounter

## 2018-09-17 ENCOUNTER — Other Ambulatory Visit: Payer: Self-pay

## 2018-09-17 ENCOUNTER — Telehealth (INDEPENDENT_AMBULATORY_CARE_PROVIDER_SITE_OTHER): Payer: 59 | Admitting: Registered Nurse

## 2018-09-17 DIAGNOSIS — J3089 Other allergic rhinitis: Secondary | ICD-10-CM | POA: Diagnosis not present

## 2018-09-17 DIAGNOSIS — J302 Other seasonal allergic rhinitis: Secondary | ICD-10-CM | POA: Diagnosis not present

## 2018-09-17 MED ORDER — FLUTICASONE PROPIONATE 50 MCG/ACT NA SUSP: NASAL | 5 refills | Status: DC

## 2018-09-17 MED ORDER — AZELASTINE HCL 0.1 % NA SOLN: 1.0000 | Freq: Two times a day (BID) | NASAL | 12 refills | Status: DC

## 2018-09-17 MED ORDER — ALBUTEROL SULFATE HFA 108 (90 BASE) MCG/ACT IN AERS: 2.0000 | INHALATION_SPRAY | Freq: Four times a day (QID) | RESPIRATORY_TRACT | 2 refills | Status: DC | PRN

## 2018-09-17 NOTE — Progress Notes (Signed)
Spoke with pt this afternoon and she states she has been having some sinues issues x 1 week. She states she has been having a scratchy throat with some nasal drainage. Pt states there has not been any SHOB,chest pain or fever.

## 2018-09-17 NOTE — Progress Notes (Signed)
Telemedicine Encounter- SOAP NOTE Established Patient  This telephone encounter was conducted with the patient's (or proxy's) verbal consent via audio telecommunications: yes   Patient was instructed to have this encounter in a suitably private space; and to only have persons present to whom they give permission to participate. In addition, patient identity was confirmed by use of name plus two identifiers (DOB and address).  I discussed the limitations, risks, security and privacy concerns of performing an evaluation and management service by telephone and the availability of in person appointments. I also discussed with the patient that there may be a patient responsible charge related to this service. The patient expressed understanding and agreed to proceed.  I spent a total of 15 minutes talking with the patient or their proxy.  No chief complaint on file.   Subjective   Jasmine [name] is a 63 y.o. established patient. Telephone visit today for scratchy throat and rhinorrhea  HPI Pt states symptoms have been ongoing for around 1-2 weeks. She states that she has had some PND. She has a history of seasonal allergies, suspects these symptoms may be related. Does not currently have Flonase, Azelastine, or Albuterol available to her.   Denies fever, headache, SHOB, Gi symptoms, changes to taste or smell. Does not have known exposure to COVID.    Patient Active Problem List   Diagnosis Date Noted  . Anorgasmia of female 05/22/2018  . Abnormal weight loss 05/22/2018  . Degeneration of spine 05/22/2018  . Depressive disorder 05/22/2018  . Diverticulitis 05/22/2018  . Achilles tendinosis of left lower extremity 01/23/2017  . Cervicogenic headache 08/24/2016  . Pain of toe of left foot 05/19/2016  . Left shoulder pain 05/11/2016  . Dry eye syndrome of both lacrimal glands 04/27/2016  . Hyperlipidemia 02/01/2016  . Benign paroxysmal positional vertigo 01/05/2016  . Fibromyalgia  10/31/2015  . Migraine 08/07/2015  . Neck pain 08/07/2015  . Myalgia and myositis 03/04/2015  . Bilateral sensorineural hearing loss 12/22/2014  . DJD (degenerative joint disease) of knee 12/09/2014  . Essential hypertension 12/08/2014  . Bradycardia 09/22/2014  . Hypercalcemia 09/22/2014  . Osteoporosis 12/23/2013  . DJD (degenerative joint disease) 08/19/2013  . Renal cyst 05/12/2013  . Obesity 09/19/2012  . Chronic low back pain 11/30/2011  . Schizophrenia ([place]) 07/14/2011  . Insomnia 09/22/2010  . Perennial allergic rhinitis with seasonal variation 09/22/2010  . Mild intermittent asthma without complication 54/65/6812  . Hypercholesteremia 09/22/2010    Past Medical History:  Diagnosis Date  . Anxiety   . Asthma   . Cervicogenic headache 08/24/2016  . Chronic high back pain   . Double vision   . Fibromyalgia   . Heart murmur   . High cholesterol   . Hypertension   . Hypoglycemia   . Kidney stone   . Meniscus tear    Right  . Migraine 08/07/2015  . PTSD (post-traumatic stress disorder)   . Schizophrenia ([place])    treated by Dr. Casimiro Needle  . Seizures ([place])    last seizure 3 years ago, not on AED  . Stroke ([place]) 07/08/11   "I've had mini strokes before; left side of face  is more down than right "  . Syncope and collapse    One episode - 07/10/15    Current Outpatient Medications  Medication Sig Dispense Refill  . albuterol (VENTOLIN HFA) 108 (90 Base) MCG/ACT inhaler Inhale 2 puffs into the lungs every 6 (six) hours as needed for wheezing or shortness  of breath. 6.7 g 2  . azelastine (ASTELIN) 0.1 % nasal spray Place 1 spray into both nostrils 2 (two) times daily. Use in each nostril as directed 30 mL 12  . cloNIDine (CATAPRES) 0.1 MG tablet Take by mouth.    . fluticasone (FLONASE) 50 MCG/ACT nasal spray USE TWO SPRAY(S) IN EACH NOSTRIL ONCE DAILY 16 g 5  . fluticasone-salmeterol (ADVAIR HFA) 115-21 MCG/ACT inhaler Inhale 1 puff into the lungs 2 (two) times daily. 12  g 5  . gabapentin (NEURONTIN) 800 MG tablet Take 1 tablet (800 mg total) by mouth 3 (three) times daily. 90 tablet 3  . losartan (COZAAR) 25 MG tablet Take 1 tablet (25 mg total) by mouth 2 (two) times daily. 180 tablet 1  . metoprolol succinate (TOPROL-XL) 25 MG 24 hr tablet Take 3 tablets (75 mg total) by mouth daily. Please make overdue appt with [place] before anymore refills. 2nd attempt 45 tablet 0  . Multiple Vitamin (THERA) TABS Take by mouth.    . polyethylene glycol powder (GLYCOLAX/MIRALAX) powder Take 17 g by mouth 2 (two) times daily as needed. 3350 g 1  . Probiotic Product (PROBIOTIC ADVANCED PO) Take 1 tablet by mouth daily.    . rosuvastatin (CRESTOR) 20 MG tablet TAKE ONE TABLET BY MOUTH EVERY EVENING. Please make overdue appt with Dr. Irish Lack before anymore refills. 2nd attempt 15 tablet 0  . temazepam (RESTORIL) 15 MG capsule Take 2 capsules by mouth at bedtime.    Marland Kitchen zolpidem (AMBIEN) 10 MG tablet Take 2 tablets by mouth at bedtime.      No current facility-administered medications for this visit.     Allergies  Allergen Reactions  . Sulfa Antibiotics Hives  . Famciclovir Other (See Comments)  . Metronidazole Nausea And Vomiting  . Soma [Carisoprodol] Other (See Comments)    Moody and sleepy Cognitive Function, Daytime sleepiness  . Acyclovir   . Acyclovir And Related   . Benadryl [Diphenhydramine Hcl]     Severe emotional reaction and doesn't work well with Pt. Past history  . Cephalosporins Hives  . Codeine Other (See Comments)    Makes patient feel odd ; "sometimes mild; sometimes severe reaction; mostly severe"  . Cyclobenzaprine Other (See Comments)    Muscle Aches.  . Diclofenac Sodium Nausea And Vomiting  . Hydrocodone   . Hydrocodone-Acetaminophen   . Linzess [Linaclotide] Diarrhea  . Nitrofurantoin Monohyd Macro     Felt funny  . Tizanidine     Other reaction(s): Other (See Comments) insomnia  . Tramadol   . Baclofen Nausea Only  . Meloxicam      Other reaction(s): Confusion  . Sulfamethoxazole Rash    Social History   Socioeconomic History  . Marital status: Married    Spouse name: Not on file  . Number of children: 3  . Years of education: 2 yrs coll  . Highest education level: Not on file  Occupational History  . Occupation: Disabled  Social Needs  . Financial resource strain: Not on file  . Food insecurity    Worry: Not on file    Inability: Not on file  . Transportation needs    Medical: Not on file    Non-medical: Not on file  Tobacco Use  . Smoking status: Former Smoker    Packs/day: 1.00    Years: 20.00    Pack years: 20.00    Types: Cigarettes    Quit date: 02/29/2004    Years since quitting: 14.5  . Smokeless  tobacco: Never Used  Substance and Sexual Activity  . Alcohol use: Yes    Alcohol/week: 0.0 standard drinks    Comment: "glass of wine q once in awhile; not very often; do it on special occasion"  . Drug use: No  . Sexual activity: Never  Lifestyle  . Physical activity    Days per week: Not on file    Minutes per session: Not on file  . Stress: Not on file  Relationships  . Social Herbalist on phone: Not on file    Gets together: Not on file    Attends religious service: Not on file    Active member of club or organization: Not on file    Attends meetings of clubs or organizations: Not on file    Relationship status: Not on file  . Intimate partner violence    Fear of current or ex partner: Not on file    Emotionally abused: Not on file    Physically abused: Not on file    Forced sexual activity: Not on file  Other Topics Concern  . Not on file  Social History Narrative   Completed 2 years of college.    On disability.   Lives at home with her husband.   No caffeine use.   Left-handed.   Three children, 2 biological - 1 adopted.    ROS Per HPI  Objective   Vitals as reported by the patient: There were no vitals filed for this visit.  Diagnoses and all orders  for this visit:  Perennial allergic rhinitis with seasonal variation -     azelastine (ASTELIN) 0.1 % nasal spray; Place 1 spray into both nostrils 2 (two) times daily. Use in each nostril as directed -     albuterol (VENTOLIN HFA) 108 (90 Base) MCG/ACT inhaler; Inhale 2 puffs into the lungs every 6 (six) hours as needed for wheezing or shortness of breath. -     fluticasone (FLONASE) 50 MCG/ACT nasal spray; USE TWO SPRAY(S) IN EACH NOSTRIL ONCE DAILY   PLAN  Likely Allergic Rhinitis. Reordered Flonase, Albuterol, and Azelastine. Will try to see if these relieve symptoms. If not, pt encouraged to follow up in 3-4 days.  Suggested COVID testing at drive through [facility] center. Explained protocol and hours available. Pt will go to be tested.  Patient encouraged to call clinic with any questions, comments, or concerns.    I discussed the assessment and treatment plan with the patient. The patient was provided an opportunity to ask questions and all were answered. The patient agreed with the plan and demonstrated an understanding of the instructions.   The patient was advised to call back or seek an in-person evaluation if the symptoms worsen or if the condition fails to improve as anticipated.  I provided 15 minutes of non-face-to-face time during this encounter.  Maximiano Coss, NP  Primary Care at [facility]

## 2018-09-24 ENCOUNTER — Other Ambulatory Visit: Payer: Self-pay | Admitting: Registered Nurse

## 2018-09-24 ENCOUNTER — Ambulatory Visit: Payer: 59

## 2018-09-24 ENCOUNTER — Other Ambulatory Visit: Payer: Self-pay

## 2018-09-24 DIAGNOSIS — J310 Chronic rhinitis: Secondary | ICD-10-CM

## 2018-09-24 NOTE — Progress Notes (Signed)
Placed order for COVID-19 testing Pt will come to office today and perform swab  Kathrin Ruddy, NP

## 2018-09-26 ENCOUNTER — Telehealth: Payer: Self-pay

## 2018-09-26 DIAGNOSIS — M25511 Pain in right shoulder: Secondary | ICD-10-CM | POA: Insufficient documentation

## 2018-09-26 NOTE — Telephone Encounter (Signed)
Pt called to discuss encounter with staff.  Discussed concerns.  Will address with staff.

## 2018-09-27 ENCOUNTER — Telehealth: Payer: Self-pay

## 2018-09-27 NOTE — Telephone Encounter (Signed)
Pt has question only wants to speak to provider

## 2018-09-28 NOTE — Telephone Encounter (Signed)
Pt called back requesting to speak with Clinical manager. When asked why pt stated "she knows why"  Best contact: [phone]

## 2019-01-22 NOTE — H&P (Signed)
Patient's anticipated LOS is less than 2 midnights, meeting these requirements: - Younger than 34 - Lives within 1 hour of care - Has a competent adult at home to recover with post-op recover - NO history of  - Chronic pain requiring opiods  - Diabetes  - Coronary Artery Disease  - Heart failure  - Heart attack  - Stroke  - DVT/VTE  - Cardiac arrhythmia  - Respiratory Failure/COPD  - Renal failure  - Anemia  - Advanced Liver disease       Jasmine [name] is an 63 y.o. female.    Chief Complaint: right shoulder pain  HPI: Pt is a 63 y.o. female complaining of right shoulder pain for multiple years. Pain had continually increased since the beginning. X-rays in the clinic show end-stage arthritic changes of the right shoulder. Pt has tried various conservative treatments which have failed to alleviate their symptoms, including injections and therapy. Various options are discussed with the patient. Risks, benefits and expectations were discussed with the patient. Patient understand the risks, benefits and expectations and wishes to proceed with surgery.   PCP:  Maximiano Coss, NP  D/C Plans: Home  PMH: Past Medical History:  Diagnosis Date  . Anxiety   . Asthma   . Cervicogenic headache 08/24/2016  . Chronic high back pain   . Double vision   . Fibromyalgia   . Heart murmur   . High cholesterol   . Hypertension   . Hypoglycemia   . Kidney stone   . Meniscus tear    Right  . Migraine 08/07/2015  . PTSD (post-traumatic stress disorder)   . Schizophrenia ([place])    treated by Dr. Casimiro Needle  . Seizures ([place])    last seizure 3 years ago, not on AED  . Stroke ([place]) 07/08/11   "I've had mini strokes before; left side of face  is more down than right "  . Syncope and collapse    One episode - 07/10/15    PSH: Past Surgical History:  Procedure Laterality Date  . COCHLEAR IMPLANT  2010   left  . [place]   left  . KIDNEY STONE SURGERY  ~ 2006  .  TONSILLECTOMY     "as a child"  . WISDOM TOOTH EXTRACTION      Social History:  reports that she quit smoking about 14 years ago. Her smoking use included cigarettes. She has a 20.00 pack-year smoking history. She has never used smokeless tobacco. She reports current alcohol use. She reports that she does not use drugs.  Allergies:  Allergies  Allergen Reactions  . Sulfa Antibiotics Hives  . Famciclovir Other (See Comments)  . Metronidazole Nausea And Vomiting  . Soma [Carisoprodol] Other (See Comments)    Moody and sleepy Cognitive Function, Daytime sleepiness  . Acyclovir   . Acyclovir And Related   . Benadryl [Diphenhydramine Hcl]     Severe emotional reaction and doesn't work well with Pt. Past history  . Cephalosporins Hives  . Codeine Other (See Comments)    Makes patient feel odd ; "sometimes mild; sometimes severe reaction; mostly severe"  . Cyclobenzaprine Other (See Comments)    Muscle Aches.  . Diclofenac Sodium Nausea And Vomiting  . Hydrocodone   . Hydrocodone-Acetaminophen   . Linzess [Linaclotide] Diarrhea  . Nitrofurantoin Monohyd Macro     Felt funny  . Tizanidine     Other reaction(s): Other (See Comments) insomnia  . Tramadol   . Baclofen  Nausea Only  . Meloxicam     Other reaction(s): Confusion  . Sulfamethoxazole Rash    Medications: No current facility-administered medications for this encounter.    Current Outpatient Medications  Medication Sig Dispense Refill  . albuterol (VENTOLIN HFA) 108 (90 Base) MCG/ACT inhaler Inhale 2 puffs into the lungs every 6 (six) hours as needed for wheezing or shortness of breath. 6.7 g 2  . azelastine (ASTELIN) 0.1 % nasal spray Place 1 spray into both nostrils 2 (two) times daily. Use in each nostril as directed 30 mL 12  . cloNIDine (CATAPRES) 0.1 MG tablet Take by mouth.    . fluticasone (FLONASE) 50 MCG/ACT nasal spray USE TWO SPRAY(S) IN EACH NOSTRIL ONCE DAILY 16 g 5  . fluticasone-salmeterol (ADVAIR HFA)  115-21 MCG/ACT inhaler Inhale 1 puff into the lungs 2 (two) times daily. 12 g 5  . gabapentin (NEURONTIN) 800 MG tablet Take 1 tablet (800 mg total) by mouth 3 (three) times daily. 90 tablet 3  . losartan (COZAAR) 25 MG tablet Take 1 tablet (25 mg total) by mouth 2 (two) times daily. 180 tablet 1  . metoprolol succinate (TOPROL-XL) 25 MG 24 hr tablet Take 3 tablets (75 mg total) by mouth daily. Please make overdue appt with [place] before anymore refills. 2nd attempt 45 tablet 0  . Multiple Vitamin (THERA) TABS Take by mouth.    . polyethylene glycol powder (GLYCOLAX/MIRALAX) powder Take 17 g by mouth 2 (two) times daily as needed. 3350 g 1  . Probiotic Product (PROBIOTIC ADVANCED PO) Take 1 tablet by mouth daily.    . rosuvastatin (CRESTOR) 20 MG tablet TAKE ONE TABLET BY MOUTH EVERY EVENING. Please make overdue appt with Dr. Irish Lack before anymore refills. 2nd attempt 15 tablet 0  . temazepam (RESTORIL) 15 MG capsule Take 2 capsules by mouth at bedtime.    Marland Kitchen zolpidem (AMBIEN) 10 MG tablet Take 2 tablets by mouth at bedtime.       No results found for this or any previous visit (from the past 48 hour(s)). No results found.  ROS: Pain with rom of the right upper extremity  Physical Exam: Alert and oriented 63 y.o. female in no acute distress Cranial nerves 2-12 intact Cervical spine: full rom with no tenderness, nv intact distally Chest: active breath sounds bilaterally, no wheeze rhonchi or rales Heart: regular rate and rhythm, no murmur Abd: non tender non distended with active bowel sounds Hip is stable with rom  Right shoulder painful and limited rom nv intact distally Moderate weakness with ER and IR No rashes or edema  Assessment/Plan Assessment: right shoulder cuff tear  Plan:  Patient will undergo a right shoulder cuff repair by Dr. Veverly Fells at [facility]. Risks benefits and expectations were discussed with the patient. Patient understand risks, benefits and  expectations and wishes to proceed. Preoperative templating of the joint replacement has been completed, documented, and submitted to the Operating Room personnel in order to optimize intra-operative equipment management.   Merla Riches PA-C, MPAS [facility] is now [employer] [address] [place], [place], [place] [address] Phone: [phone] [email or web address] Facebook  [employer]

## 2019-02-04 ENCOUNTER — Ambulatory Visit: Payer: 59 | Admitting: Registered Nurse

## 2019-02-05 ENCOUNTER — Encounter (HOSPITAL_COMMUNITY): Payer: Self-pay | Admitting: Physician Assistant

## 2019-02-12 ENCOUNTER — Telehealth: Payer: Self-pay | Admitting: Interventional Cardiology

## 2019-02-12 NOTE — Telephone Encounter (Signed)
Spoke with pt and she states her BP has been running high over the last few days.  Today higher than usual.  Takes Metoprolol Succ 100mg  and Losartan 100mg  every night.  This morning BP was 165/97.  Checked again around lunch and it was 181/101.  Pt took an extra 75mg  of Losartan (had old bottle of Losartan 25 or 50mg  tabs).  Rechecked BP prior to calling and it was 179/97.  HR in the 50s.  Currently only having slight blurred vision but did have a HA and dizziness with elevated BPs.  Denies CP, SOB, increased salt, swelling or other issues.  States she was suppose to have shoulder surgery Friday and is in a lot of pain because she can only take Tylenol.  Advised pt to lay down and relax and I will send to Dr. Irish Lack for review.  Pt has not been seen in the office since 08/2016.  Scheduled her to see NP tomorrow at 11A.  Pt appreciative for call.

## 2019-02-12 NOTE — Telephone Encounter (Signed)
Pt c/o BP issue: STAT if pt c/o blurred vision, one-sided weakness or slurred speech  1. What are your last 5 BP readings?  165/97 181/101 179/97  2. Are you having any other symptoms (ex. Dizziness, headache, blurred vision, passed out)? Dizziness, Headache, & Blurred Vision   3. What is your BP issue? Patient is calling stating she has been experiencing high BP since Sunday 02/10/19 that causes dizziness, headache, and blurred vision. She states the headache is gone today but is still having issues with blurred vision and dizziness.

## 2019-02-13 ENCOUNTER — Encounter: Payer: Self-pay | Admitting: Cardiology

## 2019-02-13 ENCOUNTER — Emergency Department (HOSPITAL_COMMUNITY): Payer: 59

## 2019-02-13 ENCOUNTER — Telehealth: Payer: Self-pay | Admitting: Cardiology

## 2019-02-13 ENCOUNTER — Ambulatory Visit (INDEPENDENT_AMBULATORY_CARE_PROVIDER_SITE_OTHER): Payer: 59 | Admitting: Cardiology

## 2019-02-13 ENCOUNTER — Emergency Department (HOSPITAL_COMMUNITY)
Admission: EM | Admit: 2019-02-13 | Discharge: 2019-02-14 | Disposition: A | Discharge status: Home / Self Care | Payer: 59 | Attending: Emergency Medicine | Admitting: Emergency Medicine

## 2019-02-13 ENCOUNTER — Encounter (HOSPITAL_COMMUNITY): Payer: Self-pay | Admitting: Emergency Medicine

## 2019-02-13 ENCOUNTER — Telehealth: Payer: Self-pay | Admitting: Interventional Cardiology

## 2019-02-13 ENCOUNTER — Other Ambulatory Visit: Payer: Self-pay

## 2019-02-13 VITALS — BP 162/98 | HR 61 | Ht 64.0 in | Wt 177.0 lb

## 2019-02-13 DIAGNOSIS — Z87891 Personal history of nicotine dependence: Secondary | ICD-10-CM | POA: Diagnosis not present

## 2019-02-13 DIAGNOSIS — E785 Hyperlipidemia, unspecified: Secondary | ICD-10-CM | POA: Diagnosis not present

## 2019-02-13 DIAGNOSIS — Z79899 Other long term (current) drug therapy: Secondary | ICD-10-CM | POA: Insufficient documentation

## 2019-02-13 DIAGNOSIS — Z9621 Cochlear implant status: Secondary | ICD-10-CM | POA: Diagnosis not present

## 2019-02-13 DIAGNOSIS — R531 Weakness: Secondary | ICD-10-CM | POA: Insufficient documentation

## 2019-02-13 DIAGNOSIS — R519 Headache, unspecified: Secondary | ICD-10-CM | POA: Diagnosis not present

## 2019-02-13 DIAGNOSIS — F209 Schizophrenia, unspecified: Secondary | ICD-10-CM | POA: Insufficient documentation

## 2019-02-13 DIAGNOSIS — I10 Essential (primary) hypertension: Secondary | ICD-10-CM | POA: Insufficient documentation

## 2019-02-13 DIAGNOSIS — R42 Dizziness and giddiness: Secondary | ICD-10-CM | POA: Diagnosis not present

## 2019-02-13 LAB — URINALYSIS, ROUTINE W REFLEX MICROSCOPIC
Bacteria, UA: NONE SEEN
Bilirubin Urine: NEGATIVE
Glucose, UA: NEGATIVE mg/dL
Hgb urine dipstick: NEGATIVE
Ketones, ur: NEGATIVE mg/dL
Nitrite: NEGATIVE
Protein, ur: NEGATIVE mg/dL
Specific Gravity, Urine: 1.024 (ref 1.005–1.030)
pH: 7 (ref 5.0–8.0)

## 2019-02-13 LAB — I-STAT CHEM 8, ED
BUN: 26 mg/dL — ABNORMAL HIGH (ref 8–23)
Calcium, Ion: 1.09 mmol/L — ABNORMAL LOW (ref 1.15–1.40)
Chloride: 104 mmol/L (ref 98–111)
Creatinine, Ser: 0.6 mg/dL (ref 0.44–1.00)
Glucose, Bld: 132 mg/dL — ABNORMAL HIGH (ref 70–99)
HCT: 45 % (ref 36.0–46.0)
Hemoglobin: 15.3 g/dL — ABNORMAL HIGH (ref 12.0–15.0)
Potassium: 3.7 mmol/L (ref 3.5–5.1)
Sodium: 143 mmol/L (ref 135–145)
TCO2: 22 mmol/L (ref 22–32)

## 2019-02-13 LAB — TROPONIN I (HIGH SENSITIVITY): Troponin I (High Sensitivity): 4 ng/L (ref <18)

## 2019-02-13 LAB — CBG MONITORING, ED: Glucose-Capillary: 119 mg/dL — ABNORMAL HIGH (ref 70–99)

## 2019-02-13 MED ORDER — FELODIPINE ER 10 MG PO TB24: 10.0000 mg | ORAL_TABLET | Freq: Every day | ORAL | 3 refills | Status: DC

## 2019-02-13 MED ORDER — SODIUM CHLORIDE 0.9 % IV SOLN: INTRAVENOUS | Status: DC

## 2019-02-13 MED ORDER — METOCLOPRAMIDE HCL 5 MG/ML IJ SOLN
10.0000 mg | Freq: Once | INTRAMUSCULAR | Status: AC
  Administered 2019-02-13: 10 mg via INTRAVENOUS
  Filled 2019-02-13: qty 2

## 2019-02-13 MED ORDER — IOHEXOL 350 MG/ML SOLN
100.0000 mL | Freq: Once | INTRAVENOUS | Status: AC | PRN
  Administered 2019-02-13: 100 mL via INTRAVENOUS

## 2019-02-13 MED ORDER — SODIUM CHLORIDE 0.9% FLUSH: 3.0000 mL | Freq: Once | INTRAVENOUS | Status: DC

## 2019-02-13 MED ORDER — SODIUM CHLORIDE 0.9 % IV BOLUS
1000.0000 mL | Freq: Once | INTRAVENOUS | Status: AC
  Administered 2019-02-13: 1000 mL via INTRAVENOUS

## 2019-02-13 MED ORDER — CLONIDINE HCL 0.1 MG PO TABS: 0.1000 mg | ORAL_TABLET | ORAL | 3 refills | Status: DC | PRN

## 2019-02-13 NOTE — Telephone Encounter (Signed)
Spoke with pt and son.  Pt called stating that she forgot to mention at her appt today about the symptoms of the "mini strokes" she has been having off and on for a few weeks.  Son states she has had about 3-4 episodes in the last 3 weeks of partial paralysis, minor HA, difficulty concentrating and chest pain/pressure.  Currently having slight CP but states it is the same as when she was here in the office and much improved from what was occurring yesterday.  Advised pt needs to pick up medication that was prescribed today.  Son concerned pt will have a stroke.  Advised if paralysis returns or increased confusion, pt needs to go to ER.  Advised will send to Pecolia Ades, NP and Dr. Irish Lack for review to see if any further recommendations.

## 2019-02-13 NOTE — Progress Notes (Signed)
Cardiology Office Note:    Date:  02/13/2019   ID:  Jasmine [name], DOB 1955/03/12, MRN [id number]  PCP:  Maximiano Coss, NP  Cardiologist:  Larae Grooms, MD  Referring MD: Maximiano Coss, NP   Chief Complaint  Patient presents with  . Hypertension    History of Present Illness:    Jasmine [name] is a 63 y.o. female with a past medical history significant for hypertension, hyperlipidemia, self-reported "mini strokes", seizures, asthma and anxiety.  The patient was seen in 08/2016 by Dr. Irish Lack for evaluation of atypical chest pain after a trip to the ER.  An echocardiogram was done showing normal LV function and normal valvular function. She also has a history of prior normal stress test in 2008 and normal echocardiogram in 2006. She also noted left-sided weakness and was concerned about possible carotid disease.  Carotid Doppler showed minimal cardiac plaque bilaterally.  Patient is here today for complaints of elevated blood pressure. She is near deaf and somewhat difficult to follow her story. We got her son on the phone to assist. Her BP has been up since Sunday. Her PCP has been trying to work with her BP for several weeks. She tries amlodipine but this caused doubling over abdominal pain. She was then started on low dose HCTZ. She only took the HCTZ for 3 doses and stopped because her BP did not come down 2 hours after taking it. She thinks there is something wrong with her body causing her BP to be elevated. 181/101 at home.   She is unhappy with recent medication changes and thinks that clonidine helped her more in the past than anything.   She states that she has had some chest pressure lasting several hours and attributes this to high blood pressure.  No current chest pain, edema, orthopnea or PND.   She thinks she may have a UTI as she has Burning with urination, no foul odor.   She also complains of left sided weakness and difficulty getting up the stairs. I mentioned  that this can be a sign of stroke. She insists that it is due to high blood pressure and not like her prior stroke. Her son thinks this is just her known left sided weakness from prior stroke.    Cardiac studies   Echocardiogram 09/13/2016 Study Conclusions  - Left ventricle: The cavity size was normal. Wall thickness was   increased in a pattern of mild LVH. Systolic function was normal.   The estimated ejection fraction was in the range of 60% to 65%.   Wall motion was normal; there were no regional wall motion   abnormalities. Doppler parameters are consistent with abnormal   left ventricular relaxation (grade 1 diastolic dysfunction).   There was no evidence of elevated ventricular filling pressure by   Doppler parameters. - Aortic valve: Transvalvular velocity was within the normal range.   There was no stenosis. There was no regurgitation. - Mitral valve: There was no regurgitation. - Left atrium: The atrium was normal in size. - Right ventricle: Systolic function was normal. - Right atrium: The atrium was normal in size. - Tricuspid valve: There was trivial regurgitation. - Pulmonary arteries: Systolic pressure was within the normal   range. - Pericardium, extracardiac: There was no pericardial effusion.  Past Medical History:  Diagnosis Date  . Anxiety   . Asthma   . Cervicogenic headache 08/24/2016  . Chronic high back pain   . Double vision   . Fibromyalgia   .  Heart murmur   . High cholesterol   . Hypertension   . Hypoglycemia   . Kidney stone   . Meniscus tear    Right  . Migraine 08/07/2015  . PTSD (post-traumatic stress disorder)   . Schizophrenia ([place])    treated by Dr. Casimiro Needle  . Seizures ([place])    last seizure 3 years ago, not on AED  . Stroke ([place]) 07/08/11   "I've had mini strokes before; left side of face  is more down than right "  . Syncope and collapse    One episode - 07/10/15    Past Surgical History:  Procedure Laterality Date  . COCHLEAR  IMPLANT  2010   left  . [place]   left  . KIDNEY STONE SURGERY  ~ 2006  . TONSILLECTOMY     "as a child"  . WISDOM TOOTH EXTRACTION      Current Medications: Current Meds  Medication Sig  . acetaminophen (TYLENOL) 500 MG tablet Take 500 mg by mouth every 8 (eight) hours as needed for moderate pain.  Marland Kitchen albuterol (VENTOLIN HFA) 108 (90 Base) MCG/ACT inhaler Inhale 2 puffs into the lungs every 6 (six) hours as needed for wheezing or shortness of breath.  Marland Kitchen azelastine (ASTELIN) 0.1 % nasal spray Place 1 spray into both nostrils 2 (two) times daily. Use in each nostril as directed (Patient taking differently: Place 1 spray into both nostrils 2 (two) times daily as needed for rhinitis. Use in each nostril as directed)  . fluticasone (FLONASE) 50 MCG/ACT nasal spray USE TWO SPRAY(S) IN EACH NOSTRIL ONCE DAILY (Patient taking differently: Place 1 spray into both nostrils daily as needed for allergies. USE TWO SPRAY(S) IN EACH NOSTRIL ONCE DAILY AS NEEEDED)  . gabapentin (NEURONTIN) 800 MG tablet Take 1 tablet (800 mg total) by mouth 3 (three) times daily.  . hydrochlorothiazide (MICROZIDE) 12.5 MG capsule Take 12.5 mg by mouth daily.  Marland Kitchen losartan (COZAAR) 100 MG tablet Take 100 mg by mouth daily.  . metoprolol succinate (TOPROL-XL) 100 MG 24 hr tablet Take 100 mg by mouth daily. Take with or immediately following a meal.  . naproxen sodium (ALEVE) 220 MG tablet Take 440 mg by mouth 2 (two) times daily as needed (pain).  . polyethylene glycol powder (GLYCOLAX/MIRALAX) powder Take 17 g by mouth 2 (two) times daily as needed. (Patient taking differently: Take 17 g by mouth 2 (two) times daily as needed for moderate constipation. )  . rosuvastatin (CRESTOR) 20 MG tablet TAKE ONE TABLET BY MOUTH EVERY EVENING. Please make overdue appt with Dr. Irish Lack before anymore refills. 2nd attempt  . temazepam (RESTORIL) 30 MG capsule Take 30 mg by mouth at bedtime.   Marland Kitchen zolpidem (AMBIEN) 10 MG  tablet Take 2 tablets by mouth at bedtime.   . [DISCONTINUED] hydrochlorothiazide (MICROZIDE) 12.5 MG capsule Take 12.5 mg by mouth daily.     Allergies:   Amlodipine, Sulfa antibiotics, Famciclovir, Metronidazole, Soma [carisoprodol], Acyclovir, Acyclovir and related, Benadryl [diphenhydramine hcl], Cephalosporins, Codeine, Cyclobenzaprine, Diclofenac sodium, Hydrocodone, Hydrocodone-acetaminophen, Linzess [linaclotide], Nitrofurantoin monohyd macro, Tizanidine, Tramadol, Baclofen, Meloxicam, and Sulfamethoxazole   Social History   Socioeconomic History  . Marital status: Married    Spouse name: Not on file  . Number of children: 3  . Years of education: 2 yrs coll  . Highest education level: Not on file  Occupational History  . Occupation: Disabled  Tobacco Use  . Smoking status: Former Smoker    Packs/day: 1.00  Years: 20.00    Pack years: 20.00    Types: Cigarettes    Quit date: 02/29/2004    Years since quitting: 14.9  . Smokeless tobacco: Never Used  Substance and Sexual Activity  . Alcohol use: Yes    Alcohol/week: 0.0 standard drinks    Comment: "glass of wine q once in awhile; not very often; do it on special occasion"  . Drug use: No  . Sexual activity: Never  Other Topics Concern  . Not on file  Social History Narrative   Completed 2 years of college.    On disability.   Lives at home with her husband.   No caffeine use.   Left-handed.   Three children, 2 biological - 1 adopted.   Social Determinants of Health   Financial Resource Strain:   . Difficulty of Paying Living Expenses: Not on file  Food Insecurity:   . Worried About Charity fundraiser in the Last Year: Not on file  . Ran Out of Food in the Last Year: Not on file  Transportation Needs:   . Lack of Transportation (Medical): Not on file  . Lack of Transportation (Non-Medical): Not on file  Physical Activity:   . Days of Exercise per Week: Not on file  . Minutes of Exercise per Session: Not on  file  Stress:   . Feeling of Stress : Not on file  Social Connections:   . Frequency of Communication with Friends and Family: Not on file  . Frequency of Social Gatherings with Friends and Family: Not on file  . Attends Religious Services: Not on file  . Active Member of Clubs or Organizations: Not on file  . Attends Archivist Meetings: Not on file  . Marital Status: Not on file     Family History: The patient's family history includes Alzheimer's disease in her paternal aunt; Arthritis in her father; Arthritis/Rheumatoid in her paternal aunt and sister; Diabetes in her paternal grandmother; Fibromyalgia in her sister; HIV in her sister; Heart attack (age of onset: 54) in her father; Hyperlipidemia in her father; Hypertension in her father; Leukemia (age of onset: 32) in her mother; Neuropathy in her brother; Skin cancer in her father; Stomach cancer in her paternal aunt. ROS:   Please see the history of present illness.     All other systems reviewed and are negative.   EKG:  EKG is ordered today.  The ekg ordered today demonstrates NSR, 61 bpm, No ST/T changes  Recent Labs: 08/03/2018: ALT 10; BUN 16; Creatinine, Ser 0.64; Hemoglobin 13.6; Platelets 235; Potassium 4.3; Sodium 143; TSH 2.350   Recent Lipid Panel    Component Value Date/Time   CHOL 165 08/03/2018 1023   TRIG 50 08/03/2018 1023   HDL 58 08/03/2018 1023   CHOLHDL 2.8 08/03/2018 1023   CHOLHDL 3 02/01/2016 1045   VLDL 17.6 02/01/2016 1045   LDLCALC 97 08/03/2018 1023    Physical Exam:    VS:  BP (!) 162/98   Pulse 61   Ht 5\' 4"  (1.626 m)   Wt 177 lb (80.3 kg)   BMI 30.38 kg/m     Wt Readings from Last 6 Encounters:  02/13/19 177 lb (80.3 kg)  08/03/18 166 lb (75.3 kg)  06/25/18 159 lb (72.1 kg)  01/18/18 193 lb (87.5 kg)  01/11/18 190 lb (86.2 kg)  01/04/17 170 lb (77.1 kg)     Physical Exam  Constitutional: She is oriented to person, place, and time.  She appears well-developed. No  distress.  HENT:  Near deafness  Neck: No JVD present.  Cardiovascular: Normal rate, regular rhythm, normal heart sounds and intact distal pulses. Exam reveals no gallop and no friction rub.  No murmur heard. Pulmonary/Chest: Effort normal and breath sounds normal. No respiratory distress. She has no wheezes. She has no rales.  Abdominal: Soft. Bowel sounds are normal.  Musculoskeletal:        General: No edema. Normal range of motion.     Cervical back: Normal range of motion and neck supple.  Neurological: She is alert and oriented to person, place, and time.  Skin: Skin is warm and dry.  Psychiatric: She has a normal mood and affect. Her behavior is normal. Judgment and thought content normal.  Vitals reviewed.   ASSESSMENT:    1. Essential (primary) hypertension   2. Hyperlipidemia, unspecified hyperlipidemia type    PLAN:    In order of problems listed above:  Hypertension -On losartan 100 mg daily, metoprolol succinate 100 mg daily. -BP elevated. Pt did not tolerate amlodipine recently. Started on low dose HCTZ. Pt stopped this after 3 doses as her BP did not come down after 2 hours. I discussed pt with Dr. Irish Lack. We will have her try felodipine 10 mg and she can use clonidine 0.1 mg as needed as the patient feels that this has worked well for her in the past. I will have her seen in our HTN clinic in 2 weeks to reassess.  -She had some mild chest pressure that she attributes to her elevated BP. Will work on BP control and have her seen in the office in 3 months with Dr. Irish Lack.   Hyperlipidemia -On rosuvastatin 20 mg daily -Lipid panel in 07/2018 showed TC 165, HDL 58, LDL 97, triglycerides 50 -Controlled, continue current therapy  Medication Adjustments/Labs and Tests Ordered: Current medicines are reviewed at length with the patient today.  Concerns regarding medicines are outlined above. Labs and tests ordered and medication changes are outlined in the patient  instructions below:  Patient Instructions  Medication Instructions:  Start, Felodipine 10 mg once a day   Start, Clonidine 0.1 mg as needed for blood pressure greater than 160/100    *If you need a refill on your cardiac medications before your next appointment, please call your pharmacy*  Lab Work: None   If you have labs (blood work) drawn today and your tests are completely normal, you will receive your results only by: Marland Kitchen MyChart Message (if you have MyChart) OR . A paper copy in the mail If you have any lab test that is abnormal or we need to change your treatment, we will call you to review the results.  Testing/Procedures: None   Follow-Up:  You are scheduled to see Dr. Irish Lack on 04/30/2019 @ 9:20 AM  Follow up with our hypertension clinic on 03/04/2019 @ 9:30 AM  Other Instructions Lifestyle Modifications to Prevent and Treat Heart Disease -Recommend heart healthy/Mediterranean diet, with whole grains, fruits, vegetables, fish, lean meats, nuts, olive oil and avocado oil.  -Limit salt intake to less than 2000 mg per day.  -Recommend moderate walking, starting slowly with a few minutes and working up to 3-5 times/week for 30-50 minutes each session. Aim for at least 150 minutes.week. Goal should be pace of 3 miles/hours, or walking 1.5 miles in 30 minutes -Recommend avoidance of tobacco products. Avoid excess alcohol. -Keep blood pressure well controlled, ideally less than 130/80.  Signed, Daune Perch, NP  02/13/2019 12:43 PM    [facility]

## 2019-02-13 NOTE — ED Notes (Signed)
Jasmine [name] [phone] husband looking for update on patient

## 2019-02-13 NOTE — ED Triage Notes (Signed)
Pt came in via [facility] as code stroke. Pt reports that around 1830 she felt weaker on the left side. Ems reports, L side weakness, L facial droop, slurred speech, left side numbness. Pt also c/o chest pain, EMS gave nitro and aspirin. Neuro spoke w/ pt's son who stated that the left side weakness is an issue that has been on and off for awhile now.

## 2019-02-13 NOTE — Telephone Encounter (Signed)
I called the patient's son back to discuss our office visit and plans.  He had been on the phone during our office visit.  I advised him that if he is concerned about stroke, the patient should go immediately to the emergency department.  He says that her "partial paralysis" is just that her usual left-sided weakness becomes more pronounced at times.  I advised him that this can occur when she is fatigued.  Also discussed the importance of getting her blood pressure under control and the new medications that we initiated.  He says that he will monitor her and follow-up on the medicine changes.  He verbalizes appreciation of the return phone call.

## 2019-02-13 NOTE — ED Notes (Signed)
[facility] called to cancel code stroke per Dr. Rory Percy

## 2019-02-13 NOTE — Consult Note (Signed)
Neurology Consultation  Reason for Consult: Code stroke, left-sided weakness Referring Physician: Dr. Isla Pence  CC: Confusion, left-sided weakness  History is obtained from: Patient, chart  HPI: Jasmine [name] is a 63 y.o. female past medical history of anxiety, fibromyalgia, hypertension, chronic back pain, migraine, schizophrenia, history of strokes in the past with residual left-sided weakness that waxes and wanes, PTSD, history of seizures many years ago not on antiepileptics, coming to the ER for evaluation of left-sided weakness that worsened this evening. According to the patient, she noticed around 6 PM that she was much weaker on the left side than baseline.  According to the chart review, she had been to her cardiologist this morning and left-sided weakness was noted at that time as well.  She was asked to come to the doctor but the family said that she has that weakness off and on and did not want to be evaluated in the ER.  At some point she called her doctor's office back in the evening and was asked to call 911.  On EMS arrival, they noticed some left-sided weakness and activated code stroke and brought her into the ER. She reports a multitude of personal stressors at this time. She reports that the left-sided weakness does come and go but this is worse than her usual baseline. She also reports of a headache-in the CT scanner and her headache was 10 out of 10 and then it gradually subsided to a much lower level. She had also complained of crushing chest pain, with this left-sided weakness.  She also reports feeling confused and foggy over the past 3 to 4 weeks.  Also complains of slurred speech, left-sided facial numbness. Imaging was performed per acute code stroke protocol-CT head and CTA head and neck.  No acute findings-details below. Review of systems positive for anxiety, current stressors out of proportion to her norm, chest pain, some shortness of breath.   LKW: Patient  reports sudden change 6 PM today but says symptoms have been ongoing now for a few weeks. tpa given?: no, outside the window, inconsistent exam Premorbid modified Rankin scale (mRS):1  ROS:  ROS was performed and is negative except as noted in the HPI.    Past Medical History:  Diagnosis Date  . Anxiety   . Asthma   . Cervicogenic headache 08/24/2016  . Chronic high back pain   . Double vision   . Fibromyalgia   . Heart murmur   . High cholesterol   . Hypertension   . Hypoglycemia   . Kidney stone   . Meniscus tear    Right  . Migraine 08/07/2015  . PTSD (post-traumatic stress disorder)   . Schizophrenia ([place])    treated by Dr. Casimiro Needle  . Seizures ([place])    last seizure 3 years ago, not on AED  . Stroke ([place]) 07/08/11   "I've had mini strokes before; left side of face  is more down than right "  . Syncope and collapse    One episode - 07/10/15    Family History  Problem Relation Age of Onset  . Heart attack Father 70       Living  . Arthritis Father   . Skin cancer Father   . Hypertension Father   . Hyperlipidemia Father   . Leukemia Mother 8       Deceased  . Alzheimer's disease Paternal Aunt        X2  . Stomach cancer Paternal Aunt  x1  . Arthritis/Rheumatoid Paternal Aunt        x1  . Neuropathy Brother        Peripheal  . Fibromyalgia Sister   . HIV Sister   . Arthritis/Rheumatoid Sister   . Diabetes Paternal Grandmother    Social History:   reports that she quit smoking about 14 years ago. Her smoking use included cigarettes. She has a 20.00 pack-year smoking history. She has never used smokeless tobacco. She reports current alcohol use. She reports that she does not use drugs.  Medications  Current Facility-Administered Medications:  .  sodium chloride flush (NS) 0.9 % injection 3 mL, 3 mL, Intravenous, Once, Isla Pence, MD  Current Outpatient Medications:  .  acetaminophen (TYLENOL) 500 MG tablet, Take 500 mg by mouth every 8 (eight)  hours as needed for moderate pain., Disp: , Rfl:  .  albuterol (VENTOLIN HFA) 108 (90 Base) MCG/ACT inhaler, Inhale 2 puffs into the lungs every 6 (six) hours as needed for wheezing or shortness of breath., Disp: 6.7 g, Rfl: 2 .  azelastine (ASTELIN) 0.1 % nasal spray, Place 1 spray into both nostrils 2 (two) times daily. Use in each nostril as directed (Patient taking differently: Place 1 spray into both nostrils 2 (two) times daily as needed for rhinitis. Use in each nostril as directed), Disp: 30 mL, Rfl: 12 .  cloNIDine (CATAPRES) 0.1 MG tablet, Take 1 tablet (0.1 mg total) by mouth as needed. Use as needed for blood pressure greater than 160/100, Disp: 30 tablet, Rfl: 3 .  dicyclomine (BENTYL) 20 MG tablet, Take 20 mg by mouth 3 (three) times daily as needed for spasms., Disp: , Rfl:  .  felodipine (PLENDIL) 10 MG 24 hr tablet, Take 1 tablet (10 mg total) by mouth daily., Disp: 90 tablet, Rfl: 3 .  fluticasone (FLONASE) 50 MCG/ACT nasal spray, USE TWO SPRAY(S) IN EACH NOSTRIL ONCE DAILY (Patient taking differently: Place 1 spray into both nostrils daily as needed for allergies. USE TWO SPRAY(S) IN EACH NOSTRIL ONCE DAILY AS NEEEDED), Disp: 16 g, Rfl: 5 .  gabapentin (NEURONTIN) 800 MG tablet, Take 1 tablet (800 mg total) by mouth 3 (three) times daily., Disp: 90 tablet, Rfl: 3 .  hydrochlorothiazide (MICROZIDE) 12.5 MG capsule, Take 12.5 mg by mouth daily., Disp: , Rfl:  .  hyoscyamine (LEVBID) 0.375 MG 12 hr tablet, Take 0.375 mg by mouth every 12 (twelve) hours as needed for cramping., Disp: , Rfl:  .  losartan (COZAAR) 100 MG tablet, Take 100 mg by mouth daily., Disp: , Rfl:  .  metoprolol succinate (TOPROL-XL) 100 MG 24 hr tablet, Take 100 mg by mouth daily. Take with or immediately following a meal., Disp: , Rfl:  .  naproxen sodium (ALEVE) 220 MG tablet, Take 440 mg by mouth 2 (two) times daily as needed (pain)., Disp: , Rfl:  .  polyethylene glycol powder (GLYCOLAX/MIRALAX) powder, Take 17 g  by mouth 2 (two) times daily as needed. (Patient taking differently: Take 17 g by mouth 2 (two) times daily as needed for moderate constipation. ), Disp: 3350 g, Rfl: 1 .  rosuvastatin (CRESTOR) 20 MG tablet, TAKE ONE TABLET BY MOUTH EVERY EVENING. Please make overdue appt with Dr. Irish Lack before anymore refills. 2nd attempt, Disp: 15 tablet, Rfl: 0 .  temazepam (RESTORIL) 30 MG capsule, Take 30 mg by mouth at bedtime. , Disp: , Rfl:  .  zolpidem (AMBIEN) 10 MG tablet, Take 2 tablets by mouth at bedtime. , Disp: ,  Rfl:   Exam: Current vital signs: BP (!) 163/80 (BP Location: Right Arm) Comment: Simultaneous filing. User may not have seen previous data.  Pulse 85 Comment: Simultaneous filing. User may not have seen previous data.  Temp 98.7 F (37.1 C) (Oral)   Resp 14 Comment: Simultaneous filing. User may not have seen previous data.  SpO2 96% Comment: Simultaneous filing. User may not have seen previous data. Vital signs in last 24 hours: Temp:  [98.7 F (37.1 C)] 98.7 F (37.1 C) (12/16 2117) Pulse Rate:  [61-85] 85 (12/16 2117) Resp:  [14] 14 (12/16 2117) BP: (162-163)/(80-98) 163/80 (12/16 2117) SpO2:  [96 %] 96 % (12/16 2117) Weight:  [80.3 kg] 80.3 kg (12/16 1103)  General: Awake alert in no distress HEENT: Cephalic atraumatic Lungs: Clear to auscultation CVS: Regular rate rhythm Abdomen: Soft nondistended nontender Extremities: Warm well perfused with intact pulses Neurological exam Awake alert oriented x3 Speech is mildly dysarthric No evidence of aphasia Patient has poor attention concentration and is extremely hard of hearing even with her cochlear implant. Cranial nerves: Pupils equal round react light, extraocular movements intact, visual fields-she says she cannot see from part of her left eye which is new but blinks to threat from both sides.  Also closes the eye pretty tight when the light is flashed into her eye.  Extremely hard of hearing from both ears.   Decreased facial sensation on the left.  Face symmetric strength bilaterally.  Tongue and palate midline. Motor exam: Effort dependent left upper and lower extremity weakness inconsistent exam.  Right upper and lower extremity are antigravity with full strength and also some giveaway weakness at times. Has some tremor which is distractible in both her arms. Sensory exam: Reports diminished sensation to the left with a sharp cut off in the midline and also reports extension when both legs are tight simultaneously but not when slight noxious stimulation is applied. Coordination: No dysmetria Gait testing deferred at this time  NIHSS-7   Labs I have reviewed labs in epic and the results pertinent to this consultation are: CBC    Component Value Date/Time   WBC 7.4 08/03/2018 1023   WBC 14.0 (H) 01/01/2017 0125   RBC 4.80 08/03/2018 1023   RBC 4.51 01/01/2017 0125   HGB 15.3 (H) 02/13/2019 2046   HGB 13.6 08/03/2018 1023   HCT 45.0 02/13/2019 2046   HCT 41.6 08/03/2018 1023   PLT 235 08/03/2018 1023   MCV 87 08/03/2018 1023   MCH 28.3 08/03/2018 1023   MCH 27.7 01/01/2017 0125   MCHC 32.7 08/03/2018 1023   MCHC 32.4 01/01/2017 0125   RDW 12.8 08/03/2018 1023   LYMPHSABS 6.0 (H) 01/01/2017 0125   MONOABS 1.0 01/01/2017 0125   EOSABS 0.4 01/01/2017 0125   BASOSABS 0.0 01/01/2017 0125    CMP     Component Value Date/Time   NA 143 02/13/2019 2046   NA 143 08/03/2018 1023   K 3.7 02/13/2019 2046   CL 104 02/13/2019 2046   CO2 22 08/03/2018 1023   GLUCOSE 132 (H) 02/13/2019 2046   BUN 26 (H) 02/13/2019 2046   BUN 16 08/03/2018 1023   CREATININE 0.60 02/13/2019 2046   CALCIUM 9.6 08/03/2018 1023   PROT 6.3 08/03/2018 1023   ALBUMIN 4.2 08/03/2018 1023   AST 17 08/03/2018 1023   ALT 10 08/03/2018 1023   ALKPHOS 65 08/03/2018 1023   BILITOT 0.5 08/03/2018 1023   GFRNONAA 97 08/03/2018 1023   GFRAA 111  08/03/2018 1023   Imaging I have reviewed the images obtained: CT  head-no acute changes.  Artifact from cochlear implant.  Also noted erosion of the cochlear implant into the bone of the skull.  Recommend ENT follow-up. CTA head and neck with no LVO.  Assessment:  63 year old with past medical history of anxiety, fibromyalgia, chronic back pain, schizophrenia, history of strokes in the past with residual left-sided weakness that waxes and wanes, history of seizures many years ago not on antiepileptics, chronic back pain, migraine, headaches, insomnia, presenting with worsening left-sided weakness with sudden worsening around 6 PM today. Exam is inconsistent with given weakness on the left.  History is suggestive of multiple psychological stressors which might be exacerbating prior existent deficits/symptoms. Due to those reasons, not a candidate for IV TPA. No cortical signs or evidence of E LVO on CTA head and neck hence not a candidate for endovascular intervention  Impression: --Left-sided weakness-probably recrudescence of old symptoms in the setting of stressors.  Evaluate for any underlying infection such as UTI which might be exacerbating this.  --Incidental note made of erosion of cochlear implant into the skull-needs outpatient ENT follow-up  Recommendations: -Check basic labs. -Check UA and chest x-ray -No further neurological work-up at this time if symptoms resolving. -Outpatient ENT follow-up for cochlear implant erosion. -Discussed my plan with Dr. Gilford Raid. -Cancel code stroke.  -- Amie Portland, MD [facility] Pager: [phone] If 7pm to 7am, please call on call as listed on [facility].

## 2019-02-13 NOTE — Patient Instructions (Addendum)
Medication Instructions:  Start, Felodipine 10 mg once a day   Start, Clonidine 0.1 mg as needed for blood pressure greater than 160/100    *If you need a refill on your cardiac medications before your next appointment, please call your pharmacy*  Lab Work: None   If you have labs (blood work) drawn today and your tests are completely normal, you will receive your results only by: Marland Kitchen MyChart Message (if you have MyChart) OR . A paper copy in the mail If you have any lab test that is abnormal or we need to change your treatment, we will call you to review the results.  Testing/Procedures: None   Follow-Up:  You are scheduled to see Dr. Irish Lack on 04/30/2019 @ 9:20 AM  Follow up with our hypertension clinic on 03/04/2019 @ 9:30 AM  Other Instructions Lifestyle Modifications to Prevent and Treat Heart Disease -Recommend heart healthy/Mediterranean diet, with whole grains, fruits, vegetables, fish, lean meats, nuts, olive oil and avocado oil.  -Limit salt intake to less than 2000 mg per day.  -Recommend moderate walking, starting slowly with a few minutes and working up to 3-5 times/week for 30-50 minutes each session. Aim for at least 150 minutes.week. Goal should be pace of 3 miles/hours, or walking 1.5 miles in 30 minutes -Recommend avoidance of tobacco products. Avoid excess alcohol. -Keep blood pressure well controlled, ideally less than 130/80.

## 2019-02-13 NOTE — Telephone Encounter (Signed)
  Pt c/o of Chest Pain: STAT if CP now or developed within 24 hours  1. Are you having CP right now? yes  2. Are you experiencing any other symptoms (ex. SOB, nausea, vomiting, sweating)?  minor partial paralysis, minor headache, diffficulty concentrating, chest pressure   3. How long have you been experiencing CP? 3 weeks  4. Is your CP continuous or coming and going? Comes and goes  5. Have you taken Nitroglycerin? no ? Patient's son calling stating that the patient is exhibiting similar symptoms to those in the past with mini strokes. She is having minor partial paralysis, minor headache, diffficulty concentrating, chest pressure.

## 2019-02-13 NOTE — ED Provider Notes (Signed)
[facility] EMERGENCY DEPARTMENT Provider Note   CSN: [id number] Arrival date & time: 02/13/19  2038  An emergency department physician performed an initial assessment on this suspected stroke patient at 2039.  History Chief Complaint  Patient presents with  . Code Stroke    Jasmine [name] is a 63 y.o. female.  Pt presents to the ED today with left sided weakness.  The pt has had intermittent dizziness, headache, and difficulty concentrating on and off for a few weeks.  She has also had intermittent left side weakness.  The pt has also had some left sided cp.  She did see cardiology today and was put on prn clonidine and felodipine.  The pt did not tell the cardiologist about these neurologic sx until the son called back and told them.  EMS was called when she developed left sided headache and arm/leg weakness.  A code stroke was called and she went straight to the CT scanner.           Past Medical History:  Diagnosis Date  . Anxiety   . Asthma   . Cervicogenic headache 08/24/2016  . Chronic high back pain   . Double vision   . Fibromyalgia   . Heart murmur   . High cholesterol   . Hypertension   . Hypoglycemia   . Kidney stone   . Meniscus tear    Right  . Migraine 08/07/2015  . PTSD (post-traumatic stress disorder)   . Schizophrenia ([place])    treated by Dr. Casimiro Needle  . Seizures ([place])    last seizure 3 years ago, not on AED  . Stroke ([place]) 07/08/11   "I've had mini strokes before; left side of face  is more down than right "  . Syncope and collapse    One episode - 07/10/15    Patient Active Problem List   Diagnosis Date Noted  . Anorgasmia of female 05/22/2018  . Abnormal weight loss 05/22/2018  . Degeneration of spine 05/22/2018  . Depressive disorder 05/22/2018  . Diverticulitis 05/22/2018  . Achilles tendinosis of left lower extremity 01/23/2017  . Cervicogenic headache 08/24/2016  . Pain of toe of left foot 05/19/2016  . Left shoulder  pain 05/11/2016  . Dry eye syndrome of both lacrimal glands 04/27/2016  . Hyperlipidemia 02/01/2016  . Benign paroxysmal positional vertigo 01/05/2016  . Fibromyalgia 10/31/2015  . Migraine 08/07/2015  . Neck pain 08/07/2015  . Myalgia and myositis 03/04/2015  . Bilateral sensorineural hearing loss 12/22/2014  . DJD (degenerative joint disease) of knee 12/09/2014  . Essential hypertension 12/08/2014  . Bradycardia 09/22/2014  . Hypercalcemia 09/22/2014  . Osteoporosis 12/23/2013  . DJD (degenerative joint disease) 08/19/2013  . Renal cyst 05/12/2013  . Obesity 09/19/2012  . Chronic low back pain 11/30/2011  . Schizophrenia ([place]) 07/14/2011  . Insomnia 09/22/2010  . Perennial allergic rhinitis with seasonal variation 09/22/2010  . Mild intermittent asthma without complication 99991111  . Hypercholesteremia 09/22/2010    Past Surgical History:  Procedure Laterality Date  . COCHLEAR IMPLANT  2010   left  . [place]   left  . KIDNEY STONE SURGERY  ~ 2006  . TONSILLECTOMY     "as a child"  . WISDOM TOOTH EXTRACTION       OB History   No obstetric history on file.     Family History  Problem Relation Age of Onset  . Heart attack Father 11  Living  . Arthritis Father   . Skin cancer Father   . Hypertension Father   . Hyperlipidemia Father   . Leukemia Mother 83       Deceased  . Alzheimer's disease Paternal Aunt        X2  . Stomach cancer Paternal Aunt        x1  . Arthritis/Rheumatoid Paternal Aunt        x1  . Neuropathy Brother        Peripheal  . Fibromyalgia Sister   . HIV Sister   . Arthritis/Rheumatoid Sister   . Diabetes Paternal Grandmother     Social History   Tobacco Use  . Smoking status: Former Smoker    Packs/day: 1.00    Years: 20.00    Pack years: 20.00    Types: Cigarettes    Quit date: 02/29/2004    Years since quitting: 14.9  . Smokeless tobacco: Never Used  Substance Use Topics  . Alcohol use: Yes     Alcohol/week: 0.0 standard drinks    Comment: "glass of wine q once in awhile; not very often; do it on special occasion"  . Drug use: No    Home Medications Prior to Admission medications   Medication Sig Start Date End Date Taking? Authorizing Provider  acetaminophen (TYLENOL) 500 MG tablet Take 500 mg by mouth every 8 (eight) hours as needed for moderate pain.    [provider]  albuterol (VENTOLIN HFA) 108 (90 Base) MCG/ACT inhaler Inhale 2 puffs into the lungs every 6 (six) hours as needed for wheezing or shortness of breath. 09/17/18   Maximiano Coss, NP  azelastine (ASTELIN) 0.1 % nasal spray Place 1 spray into both nostrils 2 (two) times daily. Use in each nostril as directed Patient taking differently: Place 1 spray into both nostrils 2 (two) times daily as needed for rhinitis. Use in each nostril as directed 09/17/18   Maximiano Coss, NP  cloNIDine (CATAPRES) 0.1 MG tablet Take 1 tablet (0.1 mg total) by mouth as needed. Use as needed for blood pressure greater than 160/100 02/13/19   Daune Perch, NP  dicyclomine (BENTYL) 20 MG tablet Take 20 mg by mouth 3 (three) times daily as needed for spasms.    [provider]  felodipine (PLENDIL) 10 MG 24 hr tablet Take 1 tablet (10 mg total) by mouth daily. 02/13/19   Daune Perch, NP  fluticasone (FLONASE) 50 MCG/ACT nasal spray USE TWO SPRAY(S) IN EACH NOSTRIL ONCE DAILY Patient taking differently: Place 1 spray into both nostrils daily as needed for allergies. USE TWO SPRAY(S) IN EACH NOSTRIL ONCE DAILY AS NEEEDED 09/17/18   Maximiano Coss, NP  gabapentin (NEURONTIN) 800 MG tablet Take 1 tablet (800 mg total) by mouth 3 (three) times daily. 02/10/17   Garvin Fila, MD  hydrochlorothiazide (MICROZIDE) 12.5 MG capsule Take 12.5 mg by mouth daily.    [provider]  hyoscyamine (LEVBID) 0.375 MG 12 hr tablet Take 0.375 mg by mouth every 12 (twelve) hours as needed for cramping.    [provider]    losartan (COZAAR) 100 MG tablet Take 100 mg by mouth daily.    [provider]  metoprolol succinate (TOPROL-XL) 100 MG 24 hr tablet Take 100 mg by mouth daily. Take with or immediately following a meal.    [provider]  naproxen sodium (ALEVE) 220 MG tablet Take 440 mg by mouth 2 (two) times daily as needed (pain).    [provider]  polyethylene glycol powder (GLYCOLAX/MIRALAX) powder Take 17 g by mouth 2 (two) times daily as needed. Patient taking differently: Take 17 g by mouth 2 (two) times daily as needed for moderate constipation.  11/18/16   Brunetta Jeans, PA-C  rosuvastatin (CRESTOR) 20 MG tablet TAKE ONE TABLET BY MOUTH EVERY EVENING. Please make overdue appt with Dr. Irish Lack before anymore refills. 2nd attempt 11/14/17   Jettie Booze, MD  temazepam (RESTORIL) 30 MG capsule Take 30 mg by mouth at bedtime.  08/15/16   [provider]  zolpidem (AMBIEN) 10 MG tablet Take 2 tablets by mouth at bedtime.  04/06/16   [provider]    Allergies    Amlodipine, Sulfa antibiotics, Famciclovir, Metronidazole, Soma [carisoprodol], Acyclovir, Acyclovir and related, Benadryl [diphenhydramine hcl], Cephalosporins, Codeine, Cyclobenzaprine, Diclofenac sodium, Hydrocodone, Hydrocodone-acetaminophen, Linzess [linaclotide], Nitrofurantoin monohyd macro, Tizanidine, Tramadol, Baclofen, Meloxicam, and Sulfamethoxazole  Review of Systems   Review of Systems  Neurological: Positive for headaches.  Psychiatric/Behavioral:       Left arm/leg weakness  All other systems reviewed and are negative.   Physical Exam Updated Vital Signs BP 138/68   Pulse 72   Temp 98.7 F (37.1 C) (Oral)   Resp 20   SpO2 96%   Physical Exam Vitals and nursing note reviewed.  Constitutional:      Appearance: Normal appearance.  HENT:     Head: Normocephalic and atraumatic.     Right Ear: External ear normal.     Left Ear: External ear normal.     Nose: Nose  normal.     Mouth/Throat:     Mouth: Mucous membranes are moist.     Pharynx: Oropharynx is clear.  Eyes:     Extraocular Movements: Extraocular movements intact.     Conjunctiva/sclera: Conjunctivae normal.     Pupils: Pupils are equal, round, and reactive to light.  Cardiovascular:     Rate and Rhythm: Normal rate and regular rhythm.     Pulses: Normal pulses.     Heart sounds: Normal heart sounds.  Pulmonary:     Effort: Pulmonary effort is normal.     Breath sounds: Normal breath sounds.  Abdominal:     General: Abdomen is flat. Bowel sounds are normal.     Palpations: Abdomen is soft.  Musculoskeletal:        General: Normal range of motion.     Cervical back: Normal range of motion and neck supple.  Skin:    General: Skin is warm.     Capillary Refill: Capillary refill takes less than 2 seconds.  Neurological:     Mental Status: She is alert and oriented to person, place, and time.  Psychiatric:        Mood and Affect: Mood normal.        Behavior: Behavior normal.        Thought Content: Thought content normal.        Judgment: Judgment normal.     ED Results / Procedures / Treatments   Labs (all labs ordered are listed, but only abnormal results are displayed) Labs Reviewed  URINALYSIS, ROUTINE W REFLEX MICROSCOPIC - Abnormal; Notable for the following components:      Result Value   Color, Urine COLORLESS (*)    Leukocytes,Ua MODERATE (*)    All other components within normal limits  I-STAT CHEM 8, ED - Abnormal; Notable for the following components:   BUN 26 (*)    Glucose, Bld 132 (*)  Calcium, Ion 1.09 (*)    Hemoglobin 15.3 (*)    All other components within normal limits  CBG MONITORING, ED - Abnormal; Notable for the following components:   Glucose-Capillary 119 (*)    All other components within normal limits  TROPONIN I (HIGH SENSITIVITY)  TROPONIN I (HIGH SENSITIVITY)    EKG None  Radiology CT Code Stroke CTA Head W/WO contrast  Result  Date: 02/13/2019 CLINICAL DATA:  Initial evaluation for acute aphasia, cognitive issues. EXAM: CT ANGIOGRAPHY HEAD AND NECK CT PERFUSION BRAIN TECHNIQUE: Multidetector CT imaging of the head and neck was performed using the standard protocol during bolus administration of intravenous contrast. Multiplanar CT image reconstructions and MIPs were obtained to evaluate the vascular anatomy. Carotid stenosis measurements (when applicable) are obtained utilizing NASCET criteria, using the distal internal carotid diameter as the denominator. Multiphase CT imaging of the brain was performed following IV bolus contrast injection. Subsequent parametric perfusion maps were calculated using RAPID software. CONTRAST:  172mL OMNIPAQUE IOHEXOL 350 MG/ML SOLN COMPARISON:  Prior head CT from earlier same day. FINDINGS: CTA NECK FINDINGS Aortic arch: Examination mildly degraded by motion artifact. Visualized aortic arch of normal caliber with normal branch pattern. No hemodynamically significant stenosis seen about the origin of the great vessels. Visualized subclavian arteries widely patent. Right carotid system: Right CCA patent from its origin to the bifurcation without stenosis. Mild calcified plaque about the right bifurcation without hemodynamically significant stenosis. Right ICA widely patent distally to the skull base without stenosis, dissection or occlusion. Left carotid system: Left CCA patent from its origin to the bifurcation without stenosis. Mild eccentric calcified plaque at the left bifurcation without stenosis. Left ICA widely patent distally without stenosis, dissection or occlusion. Vertebral arteries: Both vertebral arteries arise from the subclavian arteries. Both vertebral arteries widely patent without stenosis, dissection or occlusion. Skeleton: Osseous erosion about a left cochlear implant again noted. No other acute osseous abnormality. No discrete lytic or blastic osseous lesions. Other neck: No other  acute soft tissue abnormality within the neck. No mass lesion or adenopathy. Upper chest: Visualized upper chest demonstrates no acute finding. 3 mm nodule noted at the superior segment of the left lower lobe (series 5, image 167). Review of the MIP images confirms the above findings CTA HEAD FINDINGS Anterior circulation: Petrous segments widely patent bilaterally. Mild scattered atheromatous plaque within the cavernous/supraclinoid ICAs without high-grade stenosis. ICA termini well perfused. A1 segments patent bilaterally. Normal anterior communicating artery complex. Anterior cerebral arteries widely patent to their distal aspects without stenosis. No M1 stenosis or occlusion. Normal MCA bifurcations. Distal MCA branches well perfused and symmetric. Posterior circulation: Both vertebral arteries widely patent to the vertebrobasilar junction without stenosis. Posterior inferior cerebral arteries patent bilaterally. Basilar widely patent to its distal aspect without stenosis. Superior cerebral arteries patent bilaterally. Both PCAs primarily supplied via the basilar and are well perfused to their distal aspects. Small bilateral posterior communicating arteries noted. Venous sinuses: Grossly patent allowing for timing the contrast bolus. Anatomic variants: None significant. Review of the MIP images confirms the above findings CT Brain Perfusion Findings: ASPECTS: 10 CBF (<30%) Volume: 71mL Perfusion (Tmax>6.0s) volume: 27mL Mismatch Volume: 13mL Infarction Location:Negative CT perfusion for acute core infarct. Apparent 8 cc perfusion deficit seen involving the posterior left frontotemporal region, favored to be artifactual in nature due to streak artifact from adjacent cochlear implant. Possible ischemia not excluded. IMPRESSION: CTA HEAD AND NECK IMPRESSION: 1. Negative CTA for emergent large vessel occlusion. 2. Mild atheromatous change  about the carotid bifurcations and carotid siphons without hemodynamically  significant stenosis. CT PERFUSION IMPRESSION: 1. Negative CT perfusion for acute core infarct. 2. Apparent 8 cc perfusion deficit involving the posterior left frontotemporal region. Finding favored to be artifactual due to streak artifact from adjacent cochlear implant. Possible ischemia not entirely excluded. Finding could be further assessed with MRI and/or follow-up CT as warranted. These results were communicated to Dr. Rory Percy at 9:30 pmon 12/16/2020by text page via the [facility] messaging system. Electronically Signed   By: Jeannine Boga M.D.   On: 02/13/2019 21:52   CT Code Stroke CTA Neck W/WO contrast  Result Date: 02/13/2019 CLINICAL DATA:  Initial evaluation for acute aphasia, cognitive issues. EXAM: CT ANGIOGRAPHY HEAD AND NECK CT PERFUSION BRAIN TECHNIQUE: Multidetector CT imaging of the head and neck was performed using the standard protocol during bolus administration of intravenous contrast. Multiplanar CT image reconstructions and MIPs were obtained to evaluate the vascular anatomy. Carotid stenosis measurements (when applicable) are obtained utilizing NASCET criteria, using the distal internal carotid diameter as the denominator. Multiphase CT imaging of the brain was performed following IV bolus contrast injection. Subsequent parametric perfusion maps were calculated using RAPID software. CONTRAST:  131mL OMNIPAQUE IOHEXOL 350 MG/ML SOLN COMPARISON:  Prior head CT from earlier same day. FINDINGS: CTA NECK FINDINGS Aortic arch: Examination mildly degraded by motion artifact. Visualized aortic arch of normal caliber with normal branch pattern. No hemodynamically significant stenosis seen about the origin of the great vessels. Visualized subclavian arteries widely patent. Right carotid system: Right CCA patent from its origin to the bifurcation without stenosis. Mild calcified plaque about the right bifurcation without hemodynamically significant stenosis. Right ICA widely patent distally to  the skull base without stenosis, dissection or occlusion. Left carotid system: Left CCA patent from its origin to the bifurcation without stenosis. Mild eccentric calcified plaque at the left bifurcation without stenosis. Left ICA widely patent distally without stenosis, dissection or occlusion. Vertebral arteries: Both vertebral arteries arise from the subclavian arteries. Both vertebral arteries widely patent without stenosis, dissection or occlusion. Skeleton: Osseous erosion about a left cochlear implant again noted. No other acute osseous abnormality. No discrete lytic or blastic osseous lesions. Other neck: No other acute soft tissue abnormality within the neck. No mass lesion or adenopathy. Upper chest: Visualized upper chest demonstrates no acute finding. 3 mm nodule noted at the superior segment of the left lower lobe (series 5, image 167). Review of the MIP images confirms the above findings CTA HEAD FINDINGS Anterior circulation: Petrous segments widely patent bilaterally. Mild scattered atheromatous plaque within the cavernous/supraclinoid ICAs without high-grade stenosis. ICA termini well perfused. A1 segments patent bilaterally. Normal anterior communicating artery complex. Anterior cerebral arteries widely patent to their distal aspects without stenosis. No M1 stenosis or occlusion. Normal MCA bifurcations. Distal MCA branches well perfused and symmetric. Posterior circulation: Both vertebral arteries widely patent to the vertebrobasilar junction without stenosis. Posterior inferior cerebral arteries patent bilaterally. Basilar widely patent to its distal aspect without stenosis. Superior cerebral arteries patent bilaterally. Both PCAs primarily supplied via the basilar and are well perfused to their distal aspects. Small bilateral posterior communicating arteries noted. Venous sinuses: Grossly patent allowing for timing the contrast bolus. Anatomic variants: None significant. Review of the MIP images  confirms the above findings CT Brain Perfusion Findings: ASPECTS: 10 CBF (<30%) Volume: 54mL Perfusion (Tmax>6.0s) volume: 64mL Mismatch Volume: 61mL Infarction Location:Negative CT perfusion for acute core infarct. Apparent 8 cc perfusion deficit seen involving the posterior left  frontotemporal region, favored to be artifactual in nature due to streak artifact from adjacent cochlear implant. Possible ischemia not excluded. IMPRESSION: CTA HEAD AND NECK IMPRESSION: 1. Negative CTA for emergent large vessel occlusion. 2. Mild atheromatous change about the carotid bifurcations and carotid siphons without hemodynamically significant stenosis. CT PERFUSION IMPRESSION: 1. Negative CT perfusion for acute core infarct. 2. Apparent 8 cc perfusion deficit involving the posterior left frontotemporal region. Finding favored to be artifactual due to streak artifact from adjacent cochlear implant. Possible ischemia not entirely excluded. Finding could be further assessed with MRI and/or follow-up CT as warranted. These results were communicated to Dr. Rory Percy at 9:30 pmon 12/16/2020by text page via the [facility] messaging system. Electronically Signed   By: Jeannine Boga M.D.   On: 02/13/2019 21:52   CT Code Stroke Cerebral Perfusion with contrast  Result Date: 02/13/2019 CLINICAL DATA:  Initial evaluation for acute aphasia, cognitive issues. EXAM: CT ANGIOGRAPHY HEAD AND NECK CT PERFUSION BRAIN TECHNIQUE: Multidetector CT imaging of the head and neck was performed using the standard protocol during bolus administration of intravenous contrast. Multiplanar CT image reconstructions and MIPs were obtained to evaluate the vascular anatomy. Carotid stenosis measurements (when applicable) are obtained utilizing NASCET criteria, using the distal internal carotid diameter as the denominator. Multiphase CT imaging of the brain was performed following IV bolus contrast injection. Subsequent parametric perfusion maps were calculated  using RAPID software. CONTRAST:  150mL OMNIPAQUE IOHEXOL 350 MG/ML SOLN COMPARISON:  Prior head CT from earlier same day. FINDINGS: CTA NECK FINDINGS Aortic arch: Examination mildly degraded by motion artifact. Visualized aortic arch of normal caliber with normal branch pattern. No hemodynamically significant stenosis seen about the origin of the great vessels. Visualized subclavian arteries widely patent. Right carotid system: Right CCA patent from its origin to the bifurcation without stenosis. Mild calcified plaque about the right bifurcation without hemodynamically significant stenosis. Right ICA widely patent distally to the skull base without stenosis, dissection or occlusion. Left carotid system: Left CCA patent from its origin to the bifurcation without stenosis. Mild eccentric calcified plaque at the left bifurcation without stenosis. Left ICA widely patent distally without stenosis, dissection or occlusion. Vertebral arteries: Both vertebral arteries arise from the subclavian arteries. Both vertebral arteries widely patent without stenosis, dissection or occlusion. Skeleton: Osseous erosion about a left cochlear implant again noted. No other acute osseous abnormality. No discrete lytic or blastic osseous lesions. Other neck: No other acute soft tissue abnormality within the neck. No mass lesion or adenopathy. Upper chest: Visualized upper chest demonstrates no acute finding. 3 mm nodule noted at the superior segment of the left lower lobe (series 5, image 167). Review of the MIP images confirms the above findings CTA HEAD FINDINGS Anterior circulation: Petrous segments widely patent bilaterally. Mild scattered atheromatous plaque within the cavernous/supraclinoid ICAs without high-grade stenosis. ICA termini well perfused. A1 segments patent bilaterally. Normal anterior communicating artery complex. Anterior cerebral arteries widely patent to their distal aspects without stenosis. No M1 stenosis or  occlusion. Normal MCA bifurcations. Distal MCA branches well perfused and symmetric. Posterior circulation: Both vertebral arteries widely patent to the vertebrobasilar junction without stenosis. Posterior inferior cerebral arteries patent bilaterally. Basilar widely patent to its distal aspect without stenosis. Superior cerebral arteries patent bilaterally. Both PCAs primarily supplied via the basilar and are well perfused to their distal aspects. Small bilateral posterior communicating arteries noted. Venous sinuses: Grossly patent allowing for timing the contrast bolus. Anatomic variants: None significant. Review of the MIP images confirms the  above findings CT Brain Perfusion Findings: ASPECTS: 10 CBF (<30%) Volume: 72mL Perfusion (Tmax>6.0s) volume: 25mL Mismatch Volume: 41mL Infarction Location:Negative CT perfusion for acute core infarct. Apparent 8 cc perfusion deficit seen involving the posterior left frontotemporal region, favored to be artifactual in nature due to streak artifact from adjacent cochlear implant. Possible ischemia not excluded. IMPRESSION: CTA HEAD AND NECK IMPRESSION: 1. Negative CTA for emergent large vessel occlusion. 2. Mild atheromatous change about the carotid bifurcations and carotid siphons without hemodynamically significant stenosis. CT PERFUSION IMPRESSION: 1. Negative CT perfusion for acute core infarct. 2. Apparent 8 cc perfusion deficit involving the posterior left frontotemporal region. Finding favored to be artifactual due to streak artifact from adjacent cochlear implant. Possible ischemia not entirely excluded. Finding could be further assessed with MRI and/or follow-up CT as warranted. These results were communicated to Dr. Rory Percy at 9:30 pmon 12/16/2020by text page via the [facility] messaging system. Electronically Signed   By: Jeannine Boga M.D.   On: 02/13/2019 21:52   DG Chest Portable 1 View  Result Date: 02/13/2019 CLINICAL DATA:  Weakness EXAM: PORTABLE CHEST 1  VIEW COMPARISON:  September 01, 2016 FINDINGS: The heart size and mediastinal contours are within normal limits. Both lungs are clear. The visualized skeletal structures are unremarkable. IMPRESSION: No active disease. Electronically Signed   By: Constance Holster M.D.   On: 02/13/2019 23:28   CT HEAD CODE STROKE WO CONTRAST  Result Date: 02/13/2019 CLINICAL DATA:  Code stroke.  Left sided aphasia.  Cognitive issues. EXAM: CT HEAD WITHOUT CONTRAST TECHNIQUE: Contiguous axial images were obtained from the base of the skull through the vertex without intravenous contrast. COMPARISON:  CT head 09/01/2016 FINDINGS: Brain: Left-sided cochlear implant causing significant artifact. Allowing for this, no acute infarct, hemorrhage, mass. Vascular: Negative for hyperdense vessel Skull: Cochlear implant on the left. There is interval extensive bony destruction of the parietal bone deep to the implant. The parietal bone was normal on the prior study. Left mastoidectomy with mastoid tip effusion. Wire extends into the cochlea. Right middle ear and mastoid sinus clear. Sinuses/Orbits: Paranasal sinuses clear.  Bilateral cataract surgery Other: None ASPECTS ([place] Stroke Program Early CT Score) - Ganglionic level infarction (caudate, lentiform nuclei, internal capsule, insula, M1-M3 cortex): 7 - Supraganglionic infarction (M4-M6 cortex): 3 Total score (0-10 with 10 being normal): 10 IMPRESSION: 1. No acute abnormality 2. ASPECTS is 10 3. Cochlear implant with interval destruction of bone deep to the implant compared to the prior study. Question chronic infection 4. These results were called by telephone at the time of interpretation on 02/13/2019 at 9:13 pm to provider Rory Percy, who verbally acknowledged these results. Electronically Signed   By: Franchot Gallo M.D.   On: 02/13/2019 21:14    Procedures Procedures (including critical care time)  Medications Ordered in ED Medications  sodium chloride flush (NS) 0.9 %  injection 3 mL (3 mLs Intravenous Not Given 02/13/19 2127)  sodium chloride 0.9 % bolus 1,000 mL (1,000 mLs Intravenous New Bag/Given 02/13/19 2213)    And  0.9 %  sodium chloride infusion (has no administration in time range)  iohexol (OMNIPAQUE) 350 MG/ML injection 100 mL (100 mLs Intravenous Contrast Given 02/13/19 2116)  metoCLOPramide (REGLAN) injection 10 mg (10 mg Intravenous Given 02/13/19 2213)    ED Course  I have reviewed the triage vital signs and the nursing notes.  Pertinent labs & imaging results that were available during my care of the patient were reviewed by me and considered in my  medical decision making (see chart for details).    MDM Rules/Calculators/A&P                     There is no evidence of stroke.  No evidence of CAD.  Pt to f/u with pcp, cards, ENT.     Final Clinical Impression(s) / ED Diagnoses Final diagnoses:  Weakness  Cochlear implant in place    Rx / DC Orders ED Discharge Orders    None       Isla Pence, MD 02/13/19 2337

## 2019-02-13 NOTE — Telephone Encounter (Signed)
Pt called with complaints of headache, shaking for 40 min, Lt side numbness and elevated BP.  Also chest pain.  I instructed her to call 911, we were concerned about a stroke.  She was agreeable to this.

## 2019-02-15 ENCOUNTER — Ambulatory Visit: Admit: 2019-02-15 | Payer: 59 | Admitting: Orthopedic Surgery

## 2019-02-15 SURGERY — ARTHROSCOPY, SHOULDER, WITH ROTATOR CUFF REPAIR
Anesthesia: General | Laterality: Right

## 2019-02-20 ENCOUNTER — Telehealth: Payer: Self-pay | Admitting: Neurology

## 2019-02-20 NOTE — Telephone Encounter (Addendum)
I called pt about being dizzy and having BPV. Pt stated she had therapy in the past. Pt was last seen 11/2016. Pt was recently in the ED on 02/13/2019 for weakness and had COVD 19 test and it was negative. I advise pt to contact her PCP for further evaluation or do a video visit if she cant come in to their office.I advise her to call their office today because of the office being close next two days.I also advise pt to have PCP send over a referral if they determine its a neurology issue. Pt has seen cardiology and urgent care in the last month. PT verbalized understanding.

## 2019-02-20 NOTE — Telephone Encounter (Signed)
Revised. 

## 2019-02-20 NOTE — Telephone Encounter (Signed)
Patient called to inform that she has had a COVID test done and the result has come back negative.  Patient states she believes her vertigo could be causing her dizziness.  States the room feels like it is spinning Please follow up.

## 2019-03-04 ENCOUNTER — Ambulatory Visit (INDEPENDENT_AMBULATORY_CARE_PROVIDER_SITE_OTHER): Payer: 59 | Admitting: Pharmacist

## 2019-03-04 ENCOUNTER — Other Ambulatory Visit: Payer: Self-pay

## 2019-03-04 VITALS — BP 126/80 | HR 77

## 2019-03-04 DIAGNOSIS — I10 Essential (primary) hypertension: Secondary | ICD-10-CM

## 2019-03-04 NOTE — Progress Notes (Signed)
Patient ID: Jasmine [name]                 DOB: [birth year]                      MRN: [id number]     HPI: Jasmine [name] is a 64 y.o. female referred by Dr. Irish Lack to HTN clinic. PMH is significant for HTn, HLD, self-reported mini strokes with residual left sided weakness, seizures, schizophrenia, asthma, anxiety, fibromyalgia, and near deafness. Carotid doppler showed minimal cardiac plaque bilaterally. Pt was last seen 02/13/19 in the office with BP elevated at 162/98. She was started on felodipine 10mg  daily and was advised to use clonidine prn BP < 160/100. She also reported some mild chest pressure that she attributed to elevated BP and was scheduled for f/u with Dr Irish Lack. She did present to the ED on 12/16 with dizziness, headache, and intermittent left sided weakness. Code stroke was called, CT negative for stroke or CAD.  Pt presents today in good spirits accompanied by her husband as she is very hard of hearing. She has noted some dizziness and migraines, reports hx of vertigo in the past. Home BP fluctuating greatly up to 180s/90s, some in the 120s as well. Reports low of 80s/50s. BP in clinic normal today and pt feeling well. She took her BP medications 1 hour prior to visit, denies NSAID use, occasionally adds salt to food, minimum caffeine intake. She has used her prn clonidine twice in the past 2 weeks.  Current HTN meds: felodipine 10mg  daily, HCTZ 12.5mg  daily, losartan 100mg  daily, Toprol 100mg  daily, clonidine 0.1mg  prn BP > 160/183mmHg  Previously tried: amlodipine - abdominal pain and SOB, self-d/c'ed HCTZ after 3 doses stating it didn't work  BP goal: < 130/25mmHg  Family History: Alzheimer's disease in her paternal aunt; Arthritis in her father; Arthritis/Rheumatoid in her paternal aunt and sister; Diabetes in her paternal grandmother; Fibromyalgia in her sister; HIV in her sister; Heart attack (age of onset: 63) in her father; Hyperlipidemia in her father; Hypertension in her  father; Leukemia (age of onset: 86) in her mother; Neuropathy in her brother; Skin cancer in her father; Stomach cancer in her paternal aunt.  Social History: Former smoker 1 PPD for 20 years, quit in 2006. Rare alcohol use, denies illicit drug use. Married, 2 years college, on disability.  Diet: cereal for breakfast, skips lunch, likes lasagna, beans, corn for dinner. Occasionally adds salt to food. Not much caffeine  Exercise: Has lost 15 lbs, staying active in daily routine  Home BP readings: Home bicep cuff for 4-5 years. BP fluctuates at home   Wt Readings from Last 3 Encounters:  02/13/19 177 lb (80.3 kg)  08/03/18 166 lb (75.3 kg)  06/25/18 159 lb (72.1 kg)   BP Readings from Last 3 Encounters:  02/13/19 (!) 180/78  02/13/19 (!) 162/98  08/03/18 (!) 142/82   Pulse Readings from Last 3 Encounters:  02/13/19 62  02/13/19 61  08/03/18 65    Renal function: CrCl cannot be calculated (Unknown ideal weight.).  Past Medical History:  Diagnosis Date  . Anxiety   . Asthma   . Cervicogenic headache 08/24/2016  . Chronic high back pain   . Double vision   . Fibromyalgia   . Heart murmur   . High cholesterol   . Hypertension   . Hypoglycemia   . Kidney stone   . Meniscus tear    Right  . Migraine 08/07/2015  .  PTSD (post-traumatic stress disorder)   . Schizophrenia ([place])    treated by Dr. Casimiro Needle  . Seizures ([place])    last seizure 3 years ago, not on AED  . Stroke ([place]) 07/08/11   "I've had mini strokes before; left side of face  is more down than right "  . Syncope and collapse    One episode - 07/10/15    Current Outpatient Medications on File Prior to Visit  Medication Sig Dispense Refill  . acetaminophen (TYLENOL) 500 MG tablet Take 500 mg by mouth every 8 (eight) hours as needed for moderate pain.    Marland Kitchen albuterol (VENTOLIN HFA) 108 (90 Base) MCG/ACT inhaler Inhale 2 puffs into the lungs every 6 (six) hours as needed for wheezing or shortness of breath. 6.7 g 2  .  azelastine (ASTELIN) 0.1 % nasal spray Place 1 spray into both nostrils 2 (two) times daily. Use in each nostril as directed (Patient taking differently: Place 1 spray into both nostrils 2 (two) times daily as needed for rhinitis. Use in each nostril as directed) 30 mL 12  . cloNIDine (CATAPRES) 0.1 MG tablet Take 1 tablet (0.1 mg total) by mouth as needed. Use as needed for blood pressure greater than 160/100 30 tablet 3  . dicyclomine (BENTYL) 20 MG tablet Take 20 mg by mouth 3 (three) times daily as needed for spasms.    . felodipine (PLENDIL) 10 MG 24 hr tablet Take 1 tablet (10 mg total) by mouth daily. 90 tablet 3  . fluticasone (FLONASE) 50 MCG/ACT nasal spray USE TWO SPRAY(S) IN EACH NOSTRIL ONCE DAILY (Patient taking differently: Place 1 spray into both nostrils daily as needed for allergies. USE TWO SPRAY(S) IN EACH NOSTRIL ONCE DAILY AS NEEEDED) 16 g 5  . gabapentin (NEURONTIN) 800 MG tablet Take 1 tablet (800 mg total) by mouth 3 (three) times daily. 90 tablet 3  . hydrochlorothiazide (MICROZIDE) 12.5 MG capsule Take 12.5 mg by mouth daily.    . hyoscyamine (LEVBID) 0.375 MG 12 hr tablet Take 0.375 mg by mouth every 12 (twelve) hours as needed for cramping.    Marland Kitchen losartan (COZAAR) 100 MG tablet Take 100 mg by mouth daily.    . metoprolol succinate (TOPROL-XL) 100 MG 24 hr tablet Take 100 mg by mouth daily. Take with or immediately following a meal.    . naproxen sodium (ALEVE) 220 MG tablet Take 440 mg by mouth 2 (two) times daily as needed (pain).    . polyethylene glycol powder (GLYCOLAX/MIRALAX) powder Take 17 g by mouth 2 (two) times daily as needed. (Patient taking differently: Take 17 g by mouth 2 (two) times daily as needed for moderate constipation. ) 3350 g 1  . rosuvastatin (CRESTOR) 20 MG tablet TAKE ONE TABLET BY MOUTH EVERY EVENING. Please make overdue appt with Dr. Irish Lack before anymore refills. 2nd attempt 15 tablet 0  . temazepam (RESTORIL) 30 MG capsule Take 30 mg by mouth  at bedtime.     Marland Kitchen zolpidem (AMBIEN) 10 MG tablet Take 2 tablets by mouth at bedtime.      No current facility-administered medications on file prior to visit.    Allergies  Allergen Reactions  . Amlodipine Shortness Of Breath    Pt states she also had very bad pain   . Sulfa Antibiotics Hives  . Famciclovir Other (See Comments)  . Metronidazole Nausea And Vomiting  . Soma [Carisoprodol] Other (See Comments)    Moody and sleepy Cognitive Function, Daytime sleepiness  .  Acyclovir   . Acyclovir And Related   . Benadryl [Diphenhydramine Hcl]     Severe emotional reaction and doesn't work well with Pt. Past history  . Cephalosporins Hives  . Codeine Other (See Comments)    Makes patient feel odd ; "sometimes mild; sometimes severe reaction; mostly severe"  . Cyclobenzaprine Other (See Comments)    Muscle Aches.  . Diclofenac Sodium Nausea And Vomiting  . Hydrocodone   . Hydrocodone-Acetaminophen   . Linzess [Linaclotide] Diarrhea  . Nitrofurantoin Monohyd Macro     Felt funny  . Tizanidine     Other reaction(s): Other (See Comments) insomnia  . Tramadol   . Baclofen Nausea Only  . Meloxicam     Other reaction(s): Confusion  . Sulfamethoxazole Rash     Assessment/Plan:  1. Hypertension - BP at goal <130/69mmHg. Will continue current medications including felodipine 10mg  daily, HCTZ 12.5mg  daily, losartan 100mg  daily, Toprol 100mg  daily, clonidine 0.1mg  prn BP > 160/117mmHg. Reviewed proper BP monitoring technique at home as home readings have fluctuated. She will keep f/u with Dr Irish Lack in March. If BP readings at home continue to vary greatly, pt may benefit from 24 hour ambulatory BP monitor. F/u in HTN clinic as needed.  Jasmine [name] E. Evangelia Whitaker, PharmD, BCACP, [place] [id number] [place], [place] [address] Phone: [phone]; Fax: [phone] 03/04/2019 10:21 AM

## 2019-03-04 NOTE — Patient Instructions (Addendum)
It was nice to meet you  Your blood pressure is at goal < 130/68mmHg  Continue taking your blood pressure medications - felodipine 10mg  daily, hydrochlorothiazide 12.5mg  daily, losartan 100mg  daily, metoprolol 100mg  daily, and clonidine if needed for elevated blood pressure > 160/100  Limit your daily sodium intake to < 2,000mg  daily  Keep follow up with Dr Irish Lack in March  Call clinic with any concerns before then

## 2019-03-06 ENCOUNTER — Telehealth: Payer: Self-pay | Admitting: Interventional Cardiology

## 2019-03-06 ENCOUNTER — Telehealth: Payer: Self-pay

## 2019-03-06 NOTE — Telephone Encounter (Signed)
New Messages  Pt c/o Syncope: STAT if syncope occurred within 30 minutes and pt complains of lightheadedness High Priority if episode of passing out, completely, today or in last 24 hours   1. Did you pass out today? Yes   2. When is the last time you passed out? A couple days ago  3. Has this occurred multiple times? Yes  4. Did you have any symptoms prior to passing out? Confusion, lightheadness, numbness on one side of body

## 2019-03-06 NOTE — Telephone Encounter (Signed)
After discussing the patient's symptoms with Dr Hassell Done nurse, I called the patient and recommended the ED for further evaluation.  They verbalized understanding.

## 2019-03-06 NOTE — Telephone Encounter (Signed)
I spoke to the patient and her husband who said that the patient has passed out a few times lately.  She was very hard to discuss her symptoms with and I recommended going to the ED and following up with PCP.  I also asked her about the OV on 1/4 with Megan and she does not remember coming to the office, because she had a migraine, so I could not get any information from the visit.  She also said that she has a hx of TIAs, so I highly recommended the ED for further evaluation.

## 2019-03-07 DIAGNOSIS — Z79899 Other long term (current) drug therapy: Secondary | ICD-10-CM | POA: Insufficient documentation

## 2019-03-19 DIAGNOSIS — Z8673 Personal history of transient ischemic attack (TIA), and cerebral infarction without residual deficits: Secondary | ICD-10-CM | POA: Insufficient documentation

## 2019-03-21 DIAGNOSIS — J189 Pneumonia, unspecified organism: Secondary | ICD-10-CM | POA: Insufficient documentation

## 2019-04-09 ENCOUNTER — Ambulatory Visit: Payer: 59 | Admitting: Interventional Cardiology

## 2019-04-18 ENCOUNTER — Telehealth: Payer: Self-pay | Admitting: *Deleted

## 2019-04-18 NOTE — Telephone Encounter (Signed)
Left message letting her know that our office is closing on 04/19/19 due to inclement weather. Provided our number to call back to reschedule her appt.

## 2019-04-19 ENCOUNTER — Institutional Professional Consult (permissible substitution): Payer: 59 | Admitting: Neurology

## 2019-04-22 NOTE — Telephone Encounter (Signed)
Left message on pt's VM to r/s

## 2019-04-30 ENCOUNTER — Ambulatory Visit: Payer: 59 | Admitting: Interventional Cardiology

## 2019-05-14 ENCOUNTER — Other Ambulatory Visit: Payer: Self-pay

## 2019-05-14 ENCOUNTER — Ambulatory Visit (INDEPENDENT_AMBULATORY_CARE_PROVIDER_SITE_OTHER): Payer: 59 | Admitting: Registered Nurse

## 2019-05-14 ENCOUNTER — Encounter: Payer: Self-pay | Admitting: Registered Nurse

## 2019-05-14 VITALS — BP 144/71 | HR 90 | Temp 97.7°F | Ht 64.0 in | Wt 167.0 lb

## 2019-05-14 DIAGNOSIS — M546 Pain in thoracic spine: Secondary | ICD-10-CM

## 2019-05-14 MED ORDER — METHOCARBAMOL 750 MG PO TABS: 750.0000 mg | ORAL_TABLET | Freq: Three times a day (TID) | ORAL | 0 refills | Status: DC | PRN

## 2019-05-14 NOTE — Patient Instructions (Signed)
° ° ° °  If you have lab work done today you will be contacted with your lab results within the next 2 weeks.  If you have not heard from us then please contact us. The fastest way to get your results is to register for My Chart. ° ° °IF you received an x-ray today, you will receive an invoice from [facility]. Please contact [facility] at [phone] with questions or concerns regarding your invoice.  ° °IF you received labwork today, you will receive an invoice from [facility]. Please contact [facility] at [phone] with questions or concerns regarding your invoice.  ° °Our billing staff will not be able to assist you with questions regarding bills from these companies. ° °You will be contacted with the lab results as soon as they are available. The fastest way to get your results is to activate your My Chart account. Instructions are located on the last page of this paperwork. If you have not heard from us regarding the results in 2 weeks, please contact this office. °  ° ° ° °

## 2019-05-15 ENCOUNTER — Ambulatory Visit: Payer: 59 | Admitting: Registered Nurse

## 2019-05-16 ENCOUNTER — Telehealth: Payer: Self-pay | Admitting: Registered Nurse

## 2019-05-16 NOTE — Telephone Encounter (Signed)
Please Advise

## 2019-05-16 NOTE — Telephone Encounter (Signed)
Pt stated her methocarbamol (ROBAXIN) 750 MG tablet Kept her up all night. She has not taken it today and would like to know if she can take Norflex 2x per day instead. Please advise.

## 2019-05-16 NOTE — Telephone Encounter (Signed)
Patient was here on the 17th  and was  seen by  Maximiano Coss.Patient is  looking for CD of MRIs  done at [facility] at [facility] 2 disc. That needed to be returned to them. Suggestion was made that ortho office call [facility] as urgent request to see if  [facility]  would be willing to fax paper report with final conclusion to office where patient was calling from. Ortho doc wants to look at CDs   Please advise

## 2019-05-17 NOTE — Telephone Encounter (Signed)
Yes this would be fine  Thank you  Kathrin Ruddy, NP

## 2019-05-20 ENCOUNTER — Telehealth: Payer: Self-pay

## 2019-05-21 ENCOUNTER — Telehealth: Payer: Self-pay | Admitting: Registered Nurse

## 2019-05-21 NOTE — Telephone Encounter (Signed)
Pt called missed call from our office . No documentation

## 2019-05-21 NOTE — Telephone Encounter (Signed)
LVM for pt to let her know that it would be ok to take the Norflex instead of the Rpbaxin.

## 2019-05-23 ENCOUNTER — Encounter: Payer: Self-pay | Admitting: Registered Nurse

## 2019-05-23 NOTE — Progress Notes (Signed)
Acute Office Visit  Subjective:    Patient ID: Jasmine [name], female    DOB: 07/10/1955, 64 y.o.   MRN: [id number]  Chief Complaint  Patient presents with  . Back Pain    patient is here to talk about ongoing back pain per patient she is already taking gabapentin and tylenol    HPI Patient is in today for back and shoulder pain Has been seen at [facility] for this and has been dissatisfied with the care she received there She brings in MRI with her today, unfortunately they do not have the impression available. Pain is sharp and achy along thoracic spine.  Worsening, worst while active, better with rest. She has had a history of orthopedic pain and has been to physical therapy without success.  Denies radiculopathies, neuro symptoms, loss of bowel or bladder control, acute injury Currently taking gabapentin and acetaminophen with some relief, but not enough  Past Medical History:  Diagnosis Date  . Anxiety   . Asthma   . Cervicogenic headache 08/24/2016  . Chronic high back pain   . Double vision   . Fibromyalgia   . Heart murmur   . High cholesterol   . Hypertension   . Hypoglycemia   . Kidney stone   . Meniscus tear    Right  . Migraine 08/07/2015  . PTSD (post-traumatic stress disorder)   . Schizophrenia ([place])    treated by Dr. Casimiro Needle  . Seizures ([place])    last seizure 3 years ago, not on AED  . Stroke ([place]) 07/08/11   "I've had mini strokes before; left side of face  is more down than right "  . Syncope and collapse    One episode - 07/10/15    Past Surgical History:  Procedure Laterality Date  . COCHLEAR IMPLANT  2010   left  . [place]   left  . KIDNEY STONE SURGERY  ~ 2006  . TONSILLECTOMY     "as a child"  . WISDOM TOOTH EXTRACTION      Family History  Problem Relation Age of Onset  . Heart attack Father 51       Living  . Arthritis Father   . Skin cancer Father   . Hypertension Father   . Hyperlipidemia Father   .  Leukemia Mother 56       Deceased  . Alzheimer's disease Paternal Aunt        X2  . Stomach cancer Paternal Aunt        x1  . Arthritis/Rheumatoid Paternal Aunt        x1  . Neuropathy Brother        Peripheal  . Fibromyalgia Sister   . HIV Sister   . Arthritis/Rheumatoid Sister   . Diabetes Paternal Grandmother     Social History   Socioeconomic History  . Marital status: Married    Spouse name: Not on file  . Number of children: 3  . Years of education: 2 yrs coll  . Highest education level: Not on file  Occupational History  . Occupation: Disabled  Tobacco Use  . Smoking status: Former Smoker    Packs/day: 1.00    Years: 20.00    Pack years: 20.00    Types: Cigarettes    Quit date: 02/29/2004    Years since quitting: 15.2  . Smokeless tobacco: Never Used  Substance and Sexual Activity  . Alcohol use: Yes    Alcohol/week: 0.0  standard drinks    Comment: "glass of wine q once in awhile; not very often; do it on special occasion"  . Drug use: No  . Sexual activity: Never  Other Topics Concern  . Not on file  Social History Narrative   Completed 2 years of college.    On disability.   Lives at home with her husband.   No caffeine use.   Left-handed.   Three children, 2 biological - 1 adopted.   Social Determinants of Health   Financial Resource Strain:   . Difficulty of Paying Living Expenses:   Food Insecurity:   . Worried About Charity fundraiser in the Last Year:   . Arboriculturist in the Last Year:   Transportation Needs:   . Film/video editor (Medical):   Marland Kitchen Lack of Transportation (Non-Medical):   Physical Activity:   . Days of Exercise per Week:   . Minutes of Exercise per Session:   Stress:   . Feeling of Stress :   Social Connections:   . Frequency of Communication with Friends and Family:   . Frequency of Social Gatherings with Friends and Family:   . Attends Religious Services:   . Active Member of Clubs or Organizations:   . Attends  Archivist Meetings:   Marland Kitchen Marital Status:   Intimate Partner Violence:   . Fear of Current or Ex-Partner:   . Emotionally Abused:   Marland Kitchen Physically Abused:   . Sexually Abused:     Outpatient Medications Prior to Visit  Medication Sig Dispense Refill  . acetaminophen (TYLENOL) 500 MG tablet Take 500 mg by mouth every 8 (eight) hours as needed for moderate pain.    Marland Kitchen albuterol (VENTOLIN HFA) 108 (90 Base) MCG/ACT inhaler Inhale 2 puffs into the lungs every 6 (six) hours as needed for wheezing or shortness of breath. 6.7 g 2  . azelastine (ASTELIN) 0.1 % nasal spray Place 1 spray into both nostrils 2 (two) times daily. Use in each nostril as directed (Patient taking differently: Place 1 spray into both nostrils 2 (two) times daily as needed for rhinitis. Use in each nostril as directed) 30 mL 12  . cloNIDine (CATAPRES) 0.1 MG tablet Take 1 tablet (0.1 mg total) by mouth as needed. Use as needed for blood pressure greater than 160/100 30 tablet 3  . dicyclomine (BENTYL) 20 MG tablet Take 20 mg by mouth 3 (three) times daily as needed for spasms.    . felodipine (PLENDIL) 10 MG 24 hr tablet Take 1 tablet (10 mg total) by mouth daily. 90 tablet 3  . fluticasone (FLONASE) 50 MCG/ACT nasal spray USE TWO SPRAY(S) IN EACH NOSTRIL ONCE DAILY (Patient taking differently: Place 1 spray into both nostrils daily as needed for allergies. USE TWO SPRAY(S) IN EACH NOSTRIL ONCE DAILY AS NEEEDED) 16 g 5  . gabapentin (NEURONTIN) 800 MG tablet Take 1 tablet (800 mg total) by mouth 3 (three) times daily. 90 tablet 3  . hydrochlorothiazide (MICROZIDE) 12.5 MG capsule Take 12.5 mg by mouth daily.    . hyoscyamine (LEVBID) 0.375 MG 12 hr tablet Take 0.375 mg by mouth every 12 (twelve) hours as needed for cramping.    Marland Kitchen losartan (COZAAR) 100 MG tablet Take 100 mg by mouth daily.    . metoprolol succinate (TOPROL-XL) 100 MG 24 hr tablet Take 100 mg by mouth daily. Take with or immediately following a meal.    .  polyethylene glycol powder (GLYCOLAX/MIRALAX) powder  Take 17 g by mouth 2 (two) times daily as needed. (Patient taking differently: Take 17 g by mouth 2 (two) times daily as needed for moderate constipation. ) 3350 g 1  . rosuvastatin (CRESTOR) 20 MG tablet TAKE ONE TABLET BY MOUTH EVERY EVENING. Please make overdue appt with Dr. Irish Lack before anymore refills. 2nd attempt 15 tablet 0  . temazepam (RESTORIL) 30 MG capsule Take 30 mg by mouth at bedtime.     Marland Kitchen zolpidem (AMBIEN) 10 MG tablet Take 2 tablets by mouth at bedtime.      No facility-administered medications prior to visit.    Allergies  Allergen Reactions  . Amlodipine Shortness Of Breath    Pt states she also had very bad pain   . Sulfa Antibiotics Hives  . Famciclovir Other (See Comments)  . Metronidazole Nausea And Vomiting  . Soma [Carisoprodol] Other (See Comments)    Moody and sleepy Cognitive Function, Daytime sleepiness  . Acyclovir   . Acyclovir And Related   . Benadryl [Diphenhydramine Hcl]     Severe emotional reaction and doesn't work well with Pt. Past history  . Cephalosporins Hives  . Codeine Other (See Comments)    Makes patient feel odd ; "sometimes mild; sometimes severe reaction; mostly severe"  . Cyclobenzaprine Other (See Comments)    Muscle Aches.  . Diclofenac Sodium Nausea And Vomiting  . Hydrocodone   . Hydrocodone-Acetaminophen   . Linzess [Linaclotide] Diarrhea  . Nitrofurantoin Monohyd Macro     Felt funny  . Tizanidine     Other reaction(s): Other (See Comments) insomnia  . Tramadol   . Baclofen Nausea Only  . Meloxicam     Other reaction(s): Confusion  . Sulfamethoxazole Rash    Review of Systems  Constitutional: Negative.   HENT: Negative.   Eyes: Negative.   Respiratory: Negative.   Cardiovascular: Negative.   Gastrointestinal: Negative.   Endocrine: Negative.   Genitourinary: Negative.   Musculoskeletal: Positive for arthralgias and back pain. Negative for gait problem,  joint swelling, myalgias, neck pain and neck stiffness.  Skin: Negative.   Allergic/Immunologic: Negative.   Neurological: Negative.   Hematological: Negative.   Psychiatric/Behavioral: Negative.   All other systems reviewed and are negative.      Objective:    Physical Exam Vitals and nursing note reviewed.  Constitutional:      General: She is not in acute distress.    Appearance: Normal appearance. She is obese. She is not ill-appearing, toxic-appearing or diaphoretic.  Cardiovascular:     Rate and Rhythm: Normal rate and regular rhythm.  Pulmonary:     Effort: Pulmonary effort is normal.     Breath sounds: Normal breath sounds.  Musculoskeletal:        General: Tenderness (thoracic back) present. No swelling, deformity or signs of injury. Normal range of motion.     Right lower leg: No edema.     Left lower leg: No edema.  Skin:    Capillary Refill: Capillary refill takes less than 2 seconds.  Neurological:     General: No focal deficit present.     Mental Status: She is alert and oriented to person, place, and time. Mental status is at baseline.     Cranial Nerves: No cranial nerve deficit.  Psychiatric:        Mood and Affect: Mood normal.        Behavior: Behavior normal.        Thought Content: Thought content normal.  Judgment: Judgment normal.     BP (!) 144/71   Pulse 90   Temp 97.7 F (36.5 C) (Temporal)   Ht 5\' 4"  (1.626 m)   Wt 167 lb (75.8 kg)   SpO2 96%   BMI 28.67 kg/m  Wt Readings from Last 3 Encounters:  05/14/19 167 lb (75.8 kg)  02/13/19 177 lb (80.3 kg)  08/03/18 166 lb (75.3 kg)    Health Maintenance Due  Topic Date Due  . HIV Screening  Never done  . MAMMOGRAM  01/14/2017  . PAP SMEAR-Modifier  06/09/2017  . INFLUENZA VACCINE  09/29/2018    There are no preventive care reminders to display for this patient.   Lab Results  Component Value Date   TSH 2.350 08/03/2018   Lab Results  Component Value Date   WBC 7.4  08/03/2018   HGB 15.3 (H) 02/13/2019   HCT 45.0 02/13/2019   MCV 87 08/03/2018   PLT 235 08/03/2018   Lab Results  Component Value Date   NA 143 02/13/2019   K 3.7 02/13/2019   CO2 22 08/03/2018   GLUCOSE 132 (H) 02/13/2019   BUN 26 (H) 02/13/2019   CREATININE 0.60 02/13/2019   BILITOT 0.5 08/03/2018   ALKPHOS 65 08/03/2018   AST 17 08/03/2018   ALT 10 08/03/2018   PROT 6.3 08/03/2018   ALBUMIN 4.2 08/03/2018   CALCIUM 9.6 08/03/2018   ANIONGAP 11 01/01/2017   GFR 133.89 12/26/2016   Lab Results  Component Value Date   CHOL 165 08/03/2018   Lab Results  Component Value Date   HDL 58 08/03/2018   Lab Results  Component Value Date   LDLCALC 97 08/03/2018   Lab Results  Component Value Date   TRIG 50 08/03/2018   Lab Results  Component Value Date   CHOLHDL 2.8 08/03/2018   Lab Results  Component Value Date   HGBA1C 5.8 (H) 08/03/2018       Assessment & Plan:   Problem List Items Addressed This Visit    None    Visit Diagnoses    Thoracic spine pain    -  Primary   Relevant Medications   methocarbamol (ROBAXIN) 750 MG tablet   Other Relevant Orders   Ambulatory referral to Orthopedic Surgery       Meds ordered this encounter  Medications  . methocarbamol (ROBAXIN) 750 MG tablet    Sig: Take 1 tablet (750 mg total) by mouth every 8 (eight) hours as needed for muscle spasms (and back pain).    Dispense:  60 tablet    Refill:  0    Order Specific Question:   Supervising Provider    Answer:   Forrest Moron [id number]   PLAN  Unsure of etiology of pain - cursory review of MRI imaging shows scoliosis, however, I discussed with the patient that I am by no means an expert in readings MRIs and she would be better served by a specialist  Referral sent  Methocarbamol po bid Prn for relief  Patient encouraged to call clinic with any questions, comments, or concerns.   Maximiano Coss, NP

## 2019-05-30 ENCOUNTER — Encounter: Payer: Self-pay | Admitting: *Deleted

## 2019-05-30 NOTE — Progress Notes (Signed)
Abstract for pt's appt with Dr. [name] on 05/31/19. Source: Family medicine-[place]. Referring provider: Andres Ege FNP.

## 2019-05-31 ENCOUNTER — Ambulatory Visit (INDEPENDENT_AMBULATORY_CARE_PROVIDER_SITE_OTHER): Payer: 59 | Admitting: Neurology

## 2019-05-31 ENCOUNTER — Encounter: Payer: Self-pay | Admitting: Neurology

## 2019-05-31 ENCOUNTER — Other Ambulatory Visit: Payer: Self-pay

## 2019-05-31 VITALS — BP 119/76 | HR 72 | Temp 97.3°F | Ht 64.0 in | Wt 163.5 lb

## 2019-05-31 DIAGNOSIS — G43009 Migraine without aura, not intractable, without status migrainosus: Secondary | ICD-10-CM | POA: Diagnosis not present

## 2019-05-31 DIAGNOSIS — M797 Fibromyalgia: Secondary | ICD-10-CM | POA: Diagnosis not present

## 2019-05-31 DIAGNOSIS — M542 Cervicalgia: Secondary | ICD-10-CM | POA: Diagnosis not present

## 2019-05-31 DIAGNOSIS — R519 Headache, unspecified: Secondary | ICD-10-CM | POA: Diagnosis not present

## 2019-05-31 DIAGNOSIS — M545 Low back pain, unspecified: Secondary | ICD-10-CM

## 2019-05-31 DIAGNOSIS — G4762 Sleep related leg cramps: Secondary | ICD-10-CM | POA: Insufficient documentation

## 2019-05-31 DIAGNOSIS — G4486 Cervicogenic headache: Secondary | ICD-10-CM

## 2019-05-31 DIAGNOSIS — G8929 Other chronic pain: Secondary | ICD-10-CM

## 2019-05-31 HISTORY — DX: Sleep related leg cramps: G47.62

## 2019-05-31 MED ORDER — AIMOVIG 140 MG/ML ~~LOC~~ SOAJ: 140.0000 mg | SUBCUTANEOUS | 4 refills | Status: DC

## 2019-05-31 NOTE — Progress Notes (Signed)
Reason for visit: Fibromyalgia, headache, neck pain, low back pain, dizziness  Referring physician: Dr. Reginal Lutes is a 65 y.o. female  History of present illness:  Jasmine [name] is a 64 year old left-handed white female with a history of fibromyalgia and multiple somatic complaints.  The patient has been seen through this office previously for headaches and neck pain.  The patient was primarily felt to have cervicogenic headaches, but she does have a history of migraines that began in adolescence and have continued into adulthood.  The patient has had a lot of medication sensitivities and allergies.  In the past, she has been on gabapentin, Lyrica, Toprol, baclofen and tizanidine for the headache without complete control.  The patient currently indicates that she may get about 2 headaches a month but the headaches last a week each.  The headaches are most severe on the top of the head, but she does have some discomfort in the back of the head.  She has chronic neck stiffness.  She does have some right arm pain but she associates this with a problem with a right rotator cuff problem.  She also has decreased mobility of the neck, she also has chronic low back pain.  The patient may get blurred vision and spots in the eyes with a headache.  She has nausea but no vomiting.  She does have photophobia with the headache.  The patient takes Tylenol without much benefit.  She has taken Imitrex previously but did not feel well with the medication.  She has some tingling sensations in the hands and feet.  She had EMG and nerve conduction study for this 2 or 3 years ago through [facility], but no peripheral neuropathy was documented.  At times, the patient has felt weak all over, in early January she was found to have hypotension from blood pressure medications.  She reports some problems with nocturnal leg cramps as well.  She comes to this office for further evaluation.  She remains on gabapentin  currently.  The patient has a cochlear implant but she has been able to have MRI of the brain, cervical spine, thoracic spine, and lumbar spine.  She does have several levels of neuroforaminal stenosis in the cervical spine, but otherwise the studies have been relatively unremarkable.  Past Medical History:  Diagnosis Date  . Achilles tendinosis of left lower extremity 01/23/2017  . Anxiety   . Asthma   . Benign paroxysmal positional vertigo 01/05/2016  . Bilateral sensorineural hearing loss 12/22/2014  . Blepharitis of upper and lower eyelids of both eyes 04/27/2016  . Bradycardia 09/22/2014  . Cervicogenic headache 08/24/2016  . Chronic high back pain   . CKD (chronic kidney disease) 12/08/2014  . Cochlear implant in place   . Colon polyp   . Constipation   . Diverticulitis   . Diverticulosis   . DJD (degenerative joint disease)   . Double vision   . Dry eye syndrome of both lacrimal glands 04/27/2016  . Fibromyalgia   . Heart murmur   . High cholesterol   . Hypercalcemia   . Hypercholesterolemia   . Hyperlipidemia 02/01/2016  . Hypertension   . Hypoglycemia   . Insomnia 09/22/2010  . Kidney stone   . Meniscus tear    Right  . Migraine 08/07/2015  . Mild intermittent asthma without complication [id number]  . Nephrolithiasis 03/17/2014  . Osteoporosis   . Primary hyperparathyroidism ([place]) 12/10/2014  . PTSD (post-traumatic stress disorder)   .  Renal cyst 05/12/2013  . Schizophrenia ([place])    treated by Dr. Casimiro Needle  . Schizophrenia ([place])   . Seizures ([place])    last seizure 3 years ago, not on AED  . Stroke ([place]) 07/08/11   "I've had mini strokes before; left side of face  is more down than right "  . Syncope and collapse    One episode - 07/10/15  . TIA (transient ischemic attack)     Past Surgical History:  Procedure Laterality Date  . CATARACT EXTRACTION    . COCHLEAR IMPLANT  2010   left  . DILATION AND CURETTAGE OF UTERUS    . HERNIA REPAIR    . [place]   left  . KIDNEY STONE SURGERY  ~ 2006  . PARATHYROIDECTOMY    . skin cancer removal    . TONSILLECTOMY     "as a child"  . tooth removal    . WISDOM TOOTH EXTRACTION      Family History  Problem Relation Age of Onset  . Heart attack Father 50       Living  . Arthritis Father   . Skin cancer Father   . Hypertension Father   . Hyperlipidemia Father   . Leukemia Mother 27       Deceased  . Alzheimer's disease Paternal Aunt        X2  . Stomach cancer Paternal Aunt        x1  . Arthritis/Rheumatoid Paternal Aunt        x1  . Neuropathy Brother        Peripheal  . Fibromyalgia Sister   . HIV Sister   . Fibromyalgia Sister   . Arthritis/Rheumatoid Sister   . Diabetes Paternal Grandmother   . Thyroid disease Sister     Social history:  reports that she quit smoking about 15 years ago. Her smoking use included cigarettes. She has a 20.00 pack-year smoking history. She has never used smokeless tobacco. She reports current alcohol use. She reports that she does not use drugs.  Medications:  Prior to Admission medications   Medication Sig Start Date End Date Taking? Authorizing Provider  acetaminophen (TYLENOL) 500 MG tablet Take 500 mg by mouth every 8 (eight) hours as needed for moderate pain.   Yes [provider]  albuterol (VENTOLIN HFA) 108 (90 Base) MCG/ACT inhaler Inhale 2 puffs into the lungs every 6 (six) hours as needed for wheezing or shortness of breath. 09/17/18  Yes Maximiano Coss, NP  aspirin 81 MG EC tablet Take 81 mg by mouth daily.   Yes [provider]  azelastine (ASTELIN) 0.1 % nasal spray Place 1 spray into both nostrils 2 (two) times daily. Use in each nostril as directed Patient taking differently: Place 1 spray into both nostrils 2 (two) times daily as needed for rhinitis. Use in each nostril as directed 09/17/18  Yes Maximiano Coss, NP  B COMPLEX VITAMINS PO Take by mouth.   Yes [provider]  clonazePAM  (KLONOPIN) 1 MG tablet SMARTSIG:3 Tablet(s) By Mouth Every Other Day PRN 04/08/19  Yes [provider]  cloNIDine (CATAPRES) 0.1 MG tablet Take 1 tablet (0.1 mg total) by mouth as needed. Use as needed for blood pressure greater than 160/100 02/13/19  Yes Daune Perch, NP  felodipine (PLENDIL) 10 MG 24 hr tablet Take 1 tablet (10 mg total) by mouth daily. 02/13/19  Yes Daune Perch, NP  fluticasone (FLONASE) 50 MCG/ACT nasal  spray USE TWO SPRAY(S) IN EACH NOSTRIL ONCE DAILY Patient taking differently: Place 1 spray into both nostrils daily as needed for allergies. USE TWO SPRAY(S) IN EACH NOSTRIL ONCE DAILY AS NEEEDED 09/17/18  Yes Maximiano Coss, NP  Fluticasone-Salmeterol (ADVAIR) 100-50 MCG/DOSE AEPB    Yes [provider]  gabapentin (NEURONTIN) 800 MG tablet Take 1 tablet (800 mg total) by mouth 3 (three) times daily. 02/10/17  Yes Garvin Fila, MD  hyoscyamine (LEVBID) 0.375 MG 12 hr tablet Take 0.375 mg by mouth every 12 (twelve) hours as needed for cramping.   Yes [provider]  LACTOBACILLUS RHAMNOSUS, GG, PO Take by mouth.   Yes [provider]  levalbuterol Penne Lash HFA) 45 MCG/ACT inhaler Inhale into the lungs.   Yes [provider]  losartan (COZAAR) 100 MG tablet Take 100 mg by mouth daily.   Yes [provider]  metoprolol succinate (TOPROL-XL) 100 MG 24 hr tablet Take 100 mg by mouth daily. Take with or immediately following a meal.   Yes [provider]  Multiple Vitamin (MULTIVITAMIN PO) Take by mouth.   Yes [provider]  orphenadrine (NORFLEX) 100 MG tablet    Yes [provider]  polyethylene glycol powder (GLYCOLAX/MIRALAX) powder Take 17 g by mouth 2 (two) times daily as needed. Patient taking differently: Take 17 g by mouth 2 (two) times daily as needed for moderate constipation.  11/18/16  Yes Brunetta Jeans, PA-C  rosuvastatin (CRESTOR) 20 MG tablet TAKE ONE TABLET BY MOUTH EVERY  EVENING. Please make overdue appt with Dr. Irish Lack before anymore refills. 2nd attempt 11/14/17  Yes Jettie Booze, MD  SUMAtriptan (IMITREX) 100 MG tablet    Yes [provider]  temazepam (RESTORIL) 15 MG capsule    Yes [provider]  zolpidem (AMBIEN) 10 MG tablet Take 2 tablets by mouth at bedtime.  04/06/16  Yes [provider]  methocarbamol (ROBAXIN) 750 MG tablet Take 1 tablet (750 mg total) by mouth every 8 (eight) hours as needed for muscle spasms (and back pain). Patient not taking: Reported on 05/31/2019 05/14/19   Maximiano Coss, NP      Allergies  Allergen Reactions  . Amlodipine Shortness Of Breath    Pt states she also had very bad pain   . Sulfa Antibiotics Hives  . Famciclovir Other (See Comments)  . Metronidazole Nausea And Vomiting  . Soma [Carisoprodol] Other (See Comments)    Moody and sleepy Cognitive Function, Daytime sleepiness  . Acyclovir   . Acyclovir And Related   . Benadryl [Diphenhydramine Hcl]     Severe emotional reaction and doesn't work well with Pt. Past history  . Cephalosporins Hives  . Codeine Other (See Comments)    Makes patient feel odd ; "sometimes mild; sometimes severe reaction; mostly severe"  . Cyclobenzaprine Other (See Comments)    Muscle Aches.  . Diclofenac Sodium Nausea And Vomiting  . Dicyclomine   . Hydrocodone   . Hydrocodone-Acetaminophen   . Linzess [Linaclotide] Diarrhea  . Nitrofurantoin Monohyd Macro     Felt funny  . Suboxone [Buprenorphine Hcl-Naloxone Hcl] Nausea And Vomiting  . Tizanidine     Other reaction(s): Other (See Comments) insomnia  . Tramadol   . Baclofen Nausea Only  . Drug Ingredient [Ganciclovir] Rash  . Meloxicam     Other reaction(s): Confusion  . Sulfamethoxazole Rash    ROS:  Out of a complete 14 system review of symptoms, the patient complains only of the  following symptoms, and all other reviewed systems are negative.  Headache Neck pain, back  pain Numbness, tingling  Blood pressure 119/76, pulse 72, temperature (!) 97.3 F (36.3 C), height 5\' 4"  (1.626 m), weight 163 lb 8 oz (74.2 kg).  Physical Exam  General: The patient is alert and cooperative at the time of the examination.  The patient is somewhat hard of hearing.  Eyes: Pupils are equal, round, and reactive to light. Discs are flat bilaterally.  Neck: The neck is supple, no carotid bruits are noted.  Respiratory: The respiratory examination is clear.  Cardiovascular: The cardiovascular examination reveals a regular rate and rhythm, no obvious murmurs or rubs are noted.  Skin: Extremities are without significant edema.  Neurologic Exam  Mental status: The patient is alert and oriented x 3 at the time of the examination. The patient has apparent normal recent and remote memory, with an apparently normal attention span and concentration ability.  Cranial nerves: Facial symmetry is present. There is good sensation of the face to pinprick and soft touch bilaterally. The strength of the facial muscles and the muscles to head turning and shoulder shrug are normal bilaterally. Speech is well enunciated, no aphasia or dysarthria is noted. Extraocular movements are full. Visual fields are full. The tongue is midline, and the patient has symmetric elevation of the soft palate. No obvious hearing deficits are noted.  Motor: The motor testing reveals 5 over 5 strength of all 4 extremities. Good symmetric motor tone is noted throughout.  Sensory: Sensory testing is intact to pinprick, soft touch, vibration sensation, and position sense on all 4 extremities. No evidence of extinction is noted.  Coordination: Cerebellar testing reveals good finger-nose-finger and heel-to-shin bilaterally.  Gait and station: Gait is normal. Tandem gait is unsteady. Romberg is negative. No drift is seen.  Reflexes: Deep tendon reflexes are symmetric and normal bilaterally. Toes are downgoing  bilaterally.   MRI brain 03/07/19:    Result Impression  No acute intracranial abnormality is visualized on this limited exam.    MRI thoracic 03/07/19:  Result Impression  Mild degenerative changes throughout the thoracic spine. Otherwise unremarkable thoracic spine MRI.    MRI cervical 03/07/19:  Result Impression  No findings to explain bilateral lower extremity weakness and paresthesia. Exam sensitivity is reduced by patient motion artifact.  Cervical spine degenerative disease with severe neural foraminal narrowing at multiple levels.   The disc of the MRI of the cervical spine, brain, and thoracic spine were brought for my review.  MRI of the cervical spine does show some evidence of disc osteophyte complex at the C4-5 level with severe bilateral neuroforaminal narrowing, at the C5-6 level there is severe right and moderate left neuroforaminal narrowing and severe bilateral neuroforaminal narrowing at the C6-7 level.  CTA head and neck 02/13/20:  IMPRESSION: CTA HEAD AND NECK IMPRESSION:  1. Negative CTA for emergent large vessel occlusion. 2. Mild atheromatous change about the carotid bifurcations and carotid siphons without hemodynamically significant stenosis.  CT PERFUSION IMPRESSION:  1. Negative CT perfusion for acute core infarct. 2. Apparent 8 cc perfusion deficit involving the posterior left frontotemporal region. Finding favored to be artifactual due to streak artifact from adjacent cochlear implant. Possible ischemia not entirely excluded. Finding could be further assessed with MRI and/or follow-up CT as warranted.  * CT scan images were reviewed online. I agree with the written report.   Assessment/Plan:  1.  History of migraine headache  2.  Chronic neck and low back  pain  3.  Cervicogenic headache  4.  Fibromyalgia  5.  Nocturnal leg cramps  The patient has multiple issues.  The patient likely has a combination of cervicogenic and migraine  headache.  She has failed multiple medications for headache in the past, we will try Aimovig at this time.  The patient has multiple medication sensitivities and Aimovig is usually well-tolerated.  The patient will go on magnesium supplementation for the nocturnal leg cramps but this may also help migraine.  She will follow-up here in 3 months.  Jasmine Alexanders MD 05/31/2019 8:17 AM  [facility] [address] [place] [facility] [place], [place] [address]  Phone [phone] Fax [phone]

## 2019-05-31 NOTE — Patient Instructions (Signed)
We will start [place] for the headache.

## 2019-06-13 ENCOUNTER — Telehealth: Payer: Self-pay | Admitting: Neurology

## 2019-06-13 MED ORDER — GABAPENTIN 800 MG PO TABS: 800.0000 mg | ORAL_TABLET | Freq: Three times a day (TID) | ORAL | 1 refills | Status: DC

## 2019-06-13 NOTE — Telephone Encounter (Signed)
Pt requested a CB from MD states she needs to ask him a question directly. Would not disclose question/reason for call

## 2019-06-13 NOTE — Telephone Encounter (Signed)
I called the patient.  The patient was asking about whether or not a breast reduction procedure would help back pain, I indicated this is possible if the help reduce weight and may help change posture.  The patient is considering this procedure.  She requested a prescription for gabapentin, I will send this in.

## 2019-06-24 NOTE — Telephone Encounter (Signed)
Phone rep checked office voicemail's,on 04-23 @ 11:18 a.m. pt left message asking for a call back from Dr [name] with concerns

## 2019-06-24 NOTE — Telephone Encounter (Signed)
I called the patient.  The patient reports a "new onset" of numbness in the feet.  The patient was just seen a few weeks ago and indicated that 2 to 3 years ago she was seen at [facility] for numbness and tingling in the hands and feet.  Nerve conduction studies were done at that time were unremarkable.  The patient claimed that the tingling went away only to return again.  If this is persistent or worsening, we can always recheck a nerve conduction study in the future.

## 2019-06-26 ENCOUNTER — Ambulatory Visit (INDEPENDENT_AMBULATORY_CARE_PROVIDER_SITE_OTHER): Payer: 59 | Admitting: Registered Nurse

## 2019-06-26 ENCOUNTER — Encounter: Payer: Self-pay | Admitting: Registered Nurse

## 2019-06-26 ENCOUNTER — Other Ambulatory Visit: Payer: Self-pay

## 2019-06-26 VITALS — BP 152/84 | HR 76 | Temp 98.5°F | Resp 16 | Ht 64.0 in | Wt 167.0 lb

## 2019-06-26 DIAGNOSIS — L84 Corns and callosities: Secondary | ICD-10-CM

## 2019-06-26 MED ORDER — SALICYLIC ACID-UREA 5-10 % EX OINT: 1.0000 "application " | TOPICAL_OINTMENT | Freq: Every day | CUTANEOUS | 0 refills | Status: DC

## 2019-06-26 NOTE — Patient Instructions (Signed)
° ° ° °  If you have lab work done today you will be contacted with your lab results within the next 2 weeks.  If you have not heard from us then please contact us. The fastest way to get your results is to register for My Chart. ° ° °IF you received an x-ray today, you will receive an invoice from [facility]. Please contact [facility] at [phone] with questions or concerns regarding your invoice.  ° °IF you received labwork today, you will receive an invoice from [facility]. Please contact [facility] at [phone] with questions or concerns regarding your invoice.  ° °Our billing staff will not be able to assist you with questions regarding bills from these companies. ° °You will be contacted with the lab results as soon as they are available. The fastest way to get your results is to activate your My Chart account. Instructions are located on the last page of this paperwork. If you have not heard from us regarding the results in 2 weeks, please contact this office. °  ° ° ° °

## 2019-06-26 NOTE — Progress Notes (Signed)
Acute Office Visit  Subjective:    Patient ID: Jasmine [name], female    DOB: [birth year], 64 y.o.   MRN: [id number]  Chief Complaint  Patient presents with  . Foot Pain    patient states in the last two or three weeks she has been havingpain where she had a corn and now it has cracked. Per patient she has tried cream but not really working    HPI Patient is in today for cracked callus on foot. Located on sole of foot, below great toe of left foot. Has been present as a callus in the past, but recently cracked and has become painful No local signs of infection Has not had corns treated at podiatry in the past. Has not tried treatment at home.   Past Medical History:  Diagnosis Date  . Achilles tendinosis of left lower extremity 01/23/2017  . Anxiety   . Asthma   . Benign paroxysmal positional vertigo 01/05/2016  . Bilateral sensorineural hearing loss 12/22/2014  . Blepharitis of upper and lower eyelids of both eyes 04/27/2016  . Bradycardia 09/22/2014  . Cervicogenic headache 08/24/2016  . Chronic high back pain   . CKD (chronic kidney disease) 12/08/2014  . Cochlear implant in place   . Colon polyp   . Constipation   . Diverticulitis   . Diverticulosis   . DJD (degenerative joint disease)   . Double vision   . Dry eye syndrome of both lacrimal glands 04/27/2016  . Fibromyalgia   . Heart murmur   . High cholesterol   . Hypercalcemia   . Hypercholesterolemia   . Hyperlipidemia 02/01/2016  . Hypertension   . Hypoglycemia   . Insomnia 09/22/2010  . Kidney stone   . Meniscus tear    Right  . Migraine 08/07/2015  . Mild intermittent asthma without complication [id number]  . Nephrolithiasis 03/17/2014  . Nocturnal leg cramps 05/31/2019  . Osteoporosis   . Primary hyperparathyroidism ([place]) 12/10/2014  . PTSD (post-traumatic stress disorder)   . Renal cyst 05/12/2013  . Schizophrenia ([place])    treated by Dr. Casimiro Needle  . Schizophrenia ([place])   . Seizures ([place])    last  seizure 3 years ago, not on AED  . Stroke ([place]) 07/08/11   "I've had mini strokes before; left side of face  is more down than right "  . Syncope and collapse    One episode - 07/10/15  . TIA (transient ischemic attack)     Past Surgical History:  Procedure Laterality Date  . CATARACT EXTRACTION    . COCHLEAR IMPLANT  2010   left  . DILATION AND CURETTAGE OF UTERUS    . HERNIA REPAIR    . [place]   left  . KIDNEY STONE SURGERY  ~ 2006  . PARATHYROIDECTOMY    . skin cancer removal    . TONSILLECTOMY     "as a child"  . tooth removal    . WISDOM TOOTH EXTRACTION      Family History  Problem Relation Age of Onset  . Heart attack Father 3       Living  . Arthritis Father   . Skin cancer Father   . Hypertension Father   . Hyperlipidemia Father   . Leukemia Mother 23       Deceased  . Alzheimer's disease Paternal Aunt        X2  . Stomach cancer Paternal Aunt  x1  . Arthritis/Rheumatoid Paternal Aunt        x1  . Neuropathy Brother        Peripheal  . Fibromyalgia Sister   . HIV Sister   . Fibromyalgia Sister   . Arthritis/Rheumatoid Sister   . Diabetes Paternal Grandmother   . Thyroid disease Sister     Social History   Socioeconomic History  . Marital status: Married    Spouse name: [name]  . Number of children: 3  . Years of education: 2 yrs coll  . Highest education level: Not on file  Occupational History  . Occupation: Disabled  Tobacco Use  . Smoking status: Former Smoker    Packs/day: 1.00    Years: 20.00    Pack years: 20.00    Types: Cigarettes    Quit date: 02/29/2004    Years since quitting: 15.3  . Smokeless tobacco: Never Used  Substance and Sexual Activity  . Alcohol use: Yes    Alcohol/week: 0.0 standard drinks    Comment: "glass of wine q once in awhile; not very often; do it on special occasion"  . Drug use: No  . Sexual activity: Never  Other Topics Concern  . Not on file  Social History Narrative    Completed 2 years of college.    On disability.   Lives at home with her husband.   No caffeine use.   Left-handed.   Three children, 2 biological - 1 adopted.   Social Determinants of Health   Financial Resource Strain:   . Difficulty of Paying Living Expenses:   Food Insecurity:   . Worried About Charity fundraiser in the Last Year:   . Arboriculturist in the Last Year:   Transportation Needs:   . Film/video editor (Medical):   Marland Kitchen Lack of Transportation (Non-Medical):   Physical Activity:   . Days of Exercise per Week:   . Minutes of Exercise per Session:   Stress:   . Feeling of Stress :   Social Connections:   . Frequency of Communication with Friends and Family:   . Frequency of Social Gatherings with Friends and Family:   . Attends Religious Services:   . Active Member of Clubs or Organizations:   . Attends Archivist Meetings:   Marland Kitchen Marital Status:   Intimate Partner Violence:   . Fear of Current or Ex-Partner:   . Emotionally Abused:   Marland Kitchen Physically Abused:   . Sexually Abused:     Outpatient Medications Prior to Visit  Medication Sig Dispense Refill  . acetaminophen (TYLENOL) 500 MG tablet Take 500 mg by mouth every 8 (eight) hours as needed for moderate pain.    Marland Kitchen albuterol (VENTOLIN HFA) 108 (90 Base) MCG/ACT inhaler Inhale 2 puffs into the lungs every 6 (six) hours as needed for wheezing or shortness of breath. 6.7 g 2  . aspirin 81 MG EC tablet Take 81 mg by mouth daily.    Marland Kitchen azelastine (ASTELIN) 0.1 % nasal spray Place 1 spray into both nostrils 2 (two) times daily. Use in each nostril as directed (Patient taking differently: Place 1 spray into both nostrils 2 (two) times daily as needed for rhinitis. Use in each nostril as directed) 30 mL 12  . B COMPLEX VITAMINS PO Take by mouth.    . clonazePAM (KLONOPIN) 1 MG tablet SMARTSIG:3 Tablet(s) By Mouth Every Other Day PRN    . cloNIDine (CATAPRES) 0.1 MG tablet Take 1 tablet (  0.1 mg total) by mouth  as needed. Use as needed for blood pressure greater than 160/100 30 tablet 3  . Erenumab-aooe (AIMOVIG) 140 MG/ML SOAJ Inject 140 mg into the skin every 30 (thirty) days. 1.12 mg 4  . felodipine (PLENDIL) 10 MG 24 hr tablet Take 1 tablet (10 mg total) by mouth daily. 90 tablet 3  . fluticasone (FLONASE) 50 MCG/ACT nasal spray USE TWO SPRAY(S) IN EACH NOSTRIL ONCE DAILY (Patient taking differently: Place 1 spray into both nostrils daily as needed for allergies. USE TWO SPRAY(S) IN EACH NOSTRIL ONCE DAILY AS NEEEDED) 16 g 5  . Fluticasone-Salmeterol (ADVAIR) 100-50 MCG/DOSE AEPB     . gabapentin (NEURONTIN) 800 MG tablet Take 1 tablet (800 mg total) by mouth 3 (three) times daily. 270 tablet 1  . hyoscyamine (LEVBID) 0.375 MG 12 hr tablet Take 0.375 mg by mouth every 12 (twelve) hours as needed for cramping.    Marland Kitchen LACTOBACILLUS RHAMNOSUS, GG, PO Take by mouth.    . levalbuterol (XOPENEX HFA) 45 MCG/ACT inhaler Inhale into the lungs.    Marland Kitchen losartan (COZAAR) 100 MG tablet Take 100 mg by mouth daily.    . metoprolol succinate (TOPROL-XL) 100 MG 24 hr tablet Take 100 mg by mouth daily. Take with or immediately following a meal.    . Multiple Vitamin (MULTIVITAMIN PO) Take by mouth.    . orphenadrine (NORFLEX) 100 MG tablet     . polyethylene glycol powder (GLYCOLAX/MIRALAX) powder Take 17 g by mouth 2 (two) times daily as needed. (Patient taking differently: Take 17 g by mouth 2 (two) times daily as needed for moderate constipation. ) 3350 g 1  . rosuvastatin (CRESTOR) 20 MG tablet TAKE ONE TABLET BY MOUTH EVERY EVENING. Please make overdue appt with Dr. Irish Lack before anymore refills. 2nd attempt 15 tablet 0  . temazepam (RESTORIL) 15 MG capsule     . zolpidem (AMBIEN) 10 MG tablet Take 2 tablets by mouth at bedtime.      No facility-administered medications prior to visit.    Allergies  Allergen Reactions  . Amlodipine Shortness Of Breath    Pt states she also had very bad pain   . Sulfa  Antibiotics Hives  . Famciclovir Other (See Comments)  . Metronidazole Nausea And Vomiting  . Soma [Carisoprodol] Other (See Comments)    Moody and sleepy Cognitive Function, Daytime sleepiness  . Acyclovir   . Acyclovir And Related   . Benadryl [Diphenhydramine Hcl]     Severe emotional reaction and doesn't work well with Pt. Past history  . Cephalosporins Hives  . Codeine Other (See Comments)    Makes patient feel odd ; "sometimes mild; sometimes severe reaction; mostly severe"  . Cyclobenzaprine Other (See Comments)    Muscle Aches.  . Diclofenac Sodium Nausea And Vomiting  . Dicyclomine   . Hydrocodone   . Hydrocodone-Acetaminophen   . Linzess [Linaclotide] Diarrhea  . Nitrofurantoin Monohyd Macro     Felt funny  . Suboxone [Buprenorphine Hcl-Naloxone Hcl] Nausea And Vomiting  . Tizanidine     Other reaction(s): Other (See Comments) insomnia  . Tramadol   . Baclofen Nausea Only  . Drug Ingredient [Ganciclovir] Rash  . Meloxicam     Other reaction(s): Confusion  . Sulfamethoxazole Rash    Review of Systems  Constitutional: Negative.   HENT: Negative.   Eyes: Negative.   Respiratory: Negative.   Cardiovascular: Negative.   Gastrointestinal: Negative.   Endocrine: Negative.   Genitourinary: Negative.  Musculoskeletal: Negative.   Skin: Positive for wound. Negative for color change, pallor and rash.  Allergic/Immunologic: Negative.   Neurological: Negative.   Hematological: Negative.   Psychiatric/Behavioral: Negative.   All other systems reviewed and are negative.      Objective:    Physical Exam Vitals and nursing note reviewed.  Constitutional:      General: She is not in acute distress.    Appearance: Normal appearance. She is normal weight. She is not ill-appearing, toxic-appearing or diaphoretic.  Cardiovascular:     Rate and Rhythm: Normal rate and regular rhythm.  Skin:    General: Skin is warm and dry.     Capillary Refill: Capillary refill  takes less than 2 seconds.     Coloration: Skin is not jaundiced or pale.     Findings: Lesion (1 inch lesion - cut on callus below great toe of L foot) present. No bruising, erythema or rash.  Neurological:     General: No focal deficit present.     Mental Status: She is alert and oriented to person, place, and time. Mental status is at baseline.  Psychiatric:        Mood and Affect: Mood normal.        Behavior: Behavior normal.        Thought Content: Thought content normal.        Judgment: Judgment normal.     BP (!) 152/84   Pulse 76   Temp 98.5 F (36.9 C) (Temporal)   Resp 16   Ht 5\' 4"  (1.626 m)   Wt 167 lb (75.8 kg)   SpO2 96%   BMI 28.67 kg/m  Wt Readings from Last 3 Encounters:  06/26/19 167 lb (75.8 kg)  05/31/19 163 lb 8 oz (74.2 kg)  05/14/19 167 lb (75.8 kg)    There are no preventive care reminders to display for this patient.  There are no preventive care reminders to display for this patient.   Lab Results  Component Value Date   TSH 2.350 08/03/2018   Lab Results  Component Value Date   WBC 7.4 08/03/2018   HGB 15.3 (H) 02/13/2019   HCT 45.0 02/13/2019   MCV 87 08/03/2018   PLT 235 08/03/2018   Lab Results  Component Value Date   NA 143 02/13/2019   K 3.7 02/13/2019   CO2 22 08/03/2018   GLUCOSE 132 (H) 02/13/2019   BUN 26 (H) 02/13/2019   CREATININE 0.60 02/13/2019   BILITOT 0.5 08/03/2018   ALKPHOS 65 08/03/2018   AST 17 08/03/2018   ALT 10 08/03/2018   PROT 6.3 08/03/2018   ALBUMIN 4.2 08/03/2018   CALCIUM 9.6 08/03/2018   ANIONGAP 11 01/01/2017   GFR 133.89 12/26/2016   Lab Results  Component Value Date   CHOL 165 08/03/2018   Lab Results  Component Value Date   HDL 58 08/03/2018   Lab Results  Component Value Date   LDLCALC 97 08/03/2018   Lab Results  Component Value Date   TRIG 50 08/03/2018   Lab Results  Component Value Date   CHOLHDL 2.8 08/03/2018   Lab Results  Component Value Date   HGBA1C 5.8 (H)  08/03/2018       Assessment & Plan:   Problem List Items Addressed This Visit    None    Visit Diagnoses    Corns and callus    -  Primary   Relevant Medications   Salicylic Acid-Urea [id number] % OINT  Meds ordered this encounter  Medications  . Salicylic Acid-Urea [id number] % OINT    Sig: Apply 1 application topically daily.    Dispense:  30 g    Refill:  0    Hoping this formulation is possible through your compounding pharmacy. Thank you. Call back would be [phone] (provider personal cell) for any issues.    Order Specific Question:   Supervising Provider    Answer:   Forrest Moron [id number]   PLAN  Cut off excess tissue. Sealed with dermabond. Lotion, limit ROM, keep clean and dry.  If no further resolution, will have to consider podiatry referral.  Patient encouraged to call clinic with any questions, comments, or concerns.  Maximiano Coss, NP

## 2019-07-01 ENCOUNTER — Other Ambulatory Visit: Payer: Self-pay

## 2019-07-01 ENCOUNTER — Telehealth: Payer: Self-pay

## 2019-07-01 DIAGNOSIS — L84 Corns and callosities: Secondary | ICD-10-CM

## 2019-07-01 MED ORDER — SALICYLIC ACID-UREA 5-10 % EX OINT: 1.0000 "application " | TOPICAL_OINTMENT | Freq: Every day | CUTANEOUS | 0 refills | Status: DC

## 2019-07-01 NOTE — Telephone Encounter (Signed)
Pt called state Jasmine [name] never sent her Med to the Pharmacy.She need to Talk to him Please Advice

## 2019-07-01 NOTE — Telephone Encounter (Signed)
Pt local pharmacy does not make the compound medication needed for her feet. Called pt to ask what pharmacy she'd prefer either [facility] or [facility] to pick up the ointment. Pt became very rude with yelling stating this was suppose to be done at last wk appt. Pt would not answer question of what pharmacy she wanted so call was disconnected by myself.

## 2019-07-03 ENCOUNTER — Telehealth: Payer: Self-pay

## 2019-07-03 NOTE — Telephone Encounter (Signed)
Attempted to call the patient see if she still had any questions or concerns. LVM to return the call.

## 2019-07-03 NOTE — Telephone Encounter (Signed)
Copied from [place] [id number]. Topic: General - Other >> Jul 01, 2019 10:36 AM Yvette Rack wrote: Reason for CRM: Pt stated she needs to speak with Maximiano Coss personally. Pt requests call back asap

## 2019-07-04 ENCOUNTER — Telehealth: Payer: Self-pay

## 2019-07-04 ENCOUNTER — Telehealth: Payer: Self-pay | Admitting: Neurology

## 2019-07-04 NOTE — Telephone Encounter (Signed)
Patient called stating she has been unable to get her monthly injections (pt does not know the name of med) and is unsure what is going on and would like to know if RN or MD could contact pharmacy to see what is going with medication  Manuel Garcia, [address], [place] [address]

## 2019-07-04 NOTE — Telephone Encounter (Signed)
I called pt that the aimovig is requiring a PA.I stated pharmacy was called and they were faxing the PA form to our office number and not fax number. I advise pt the PA will get done today before we closed. I stated the pharmacy has the updated fax number.She appreciate the call and verbalized understanding.

## 2019-07-04 NOTE — Telephone Encounter (Signed)
PA done expedited on cover my meds. Pharmacy was sending PA to our office number. [facility] is reviewing your PA request. Typically an electronic response will be received within 72 hours. To check for an update later, open this request from your dashboard.

## 2019-07-04 NOTE — Telephone Encounter (Signed)
Message from Plan Request Reference Number: [id number]. AIMOVIG INJ 140MG /ML is approved through 01/04/2020. Your patient may now fill this prescription and it will be covered.

## 2019-07-04 NOTE — Telephone Encounter (Signed)
I called pt that her medication was approve till 01/06/2020.Pt appreciate the call and will call pharmacy to get aimovig.

## 2019-07-04 NOTE — Telephone Encounter (Signed)
I called the pharmacy that pt is unable to pick up her aimovig injection. The pharmacy stated they have sent  15 PA  request to [phone]. I stated that's our office number. They stated the [phone] is listed as our fax number. I gave them the fax number.

## 2019-07-08 NOTE — Telephone Encounter (Signed)
No action needed, closing encounter from inbox

## 2019-07-08 NOTE — Telephone Encounter (Signed)
Made in error

## 2019-07-10 ENCOUNTER — Ambulatory Visit (INDEPENDENT_AMBULATORY_CARE_PROVIDER_SITE_OTHER): Payer: 59 | Admitting: Plastic Surgery

## 2019-07-10 ENCOUNTER — Other Ambulatory Visit: Payer: Self-pay

## 2019-07-10 ENCOUNTER — Encounter: Payer: Self-pay | Admitting: Plastic Surgery

## 2019-07-10 VITALS — BP 182/89 | HR 92 | Temp 98.0°F | Ht 64.0 in | Wt 165.0 lb

## 2019-07-10 DIAGNOSIS — N62 Hypertrophy of breast: Secondary | ICD-10-CM | POA: Diagnosis not present

## 2019-07-10 DIAGNOSIS — M4004 Postural kyphosis, thoracic region: Secondary | ICD-10-CM | POA: Diagnosis not present

## 2019-07-10 DIAGNOSIS — M546 Pain in thoracic spine: Secondary | ICD-10-CM

## 2019-07-10 DIAGNOSIS — M545 Low back pain, unspecified: Secondary | ICD-10-CM

## 2019-07-10 DIAGNOSIS — M793 Panniculitis, unspecified: Secondary | ICD-10-CM | POA: Diagnosis not present

## 2019-07-10 NOTE — Progress Notes (Signed)
Referring Provider Maximiano Coss, NP [place],  [place] [address]   CC:  Chief Complaint  Patient presents with  . Advice Only    for (B) breast reduction      Jasmine [name] is an 64 y.o. female.  HPI: Patient presents to discuss breast reduction.  She has had years of back pain and neck pain related to her breast.  She is seen orthopedic surgeon who has evaluated her back and believes that her breasts are causing this problem.  She has been to physical therapy and a chiropractor with little relief of her symptoms.  She has had a mammogram this year that was normal.  She has no family history of breast cancer.  She is a 83 8D and wants to be smaller.  She does not smoke and is not a diabetic.  She also wants to discuss her overhanging pannus.  She has had some weight loss in the past that resulted in an overhanging skin and fat tissue.  She gets rashes in the fold that have not been controlled with over-the-counter medications.  She has not had any abdominal surgeries and has never been diagnosed with a hernia.  Allergies  Allergen Reactions  . Amlodipine Shortness Of Breath    Pt states she also had very bad pain   . Sulfa Antibiotics Hives  . Famciclovir Other (See Comments)  . Metronidazole Nausea And Vomiting  . Soma [Carisoprodol] Other (See Comments)    Moody and sleepy Cognitive Function, Daytime sleepiness  . Acyclovir   . Acyclovir And Related   . Benadryl [Diphenhydramine Hcl]     Severe emotional reaction and doesn't work well with Pt. Past history  . Cephalosporins Hives  . Codeine Other (See Comments)    Makes patient feel odd ; "sometimes mild; sometimes severe reaction; mostly severe"  . Cyclobenzaprine Other (See Comments)    Muscle Aches.  . Diclofenac Sodium Nausea And Vomiting  . Dicyclomine   . Hydrocodone   . Hydrocodone-Acetaminophen   . Linzess [Linaclotide] Diarrhea  . Nitrofurantoin Monohyd Macro     Felt funny  . Suboxone  [Buprenorphine Hcl-Naloxone Hcl] Nausea And Vomiting  . Tizanidine     Other reaction(s): Other (See Comments) insomnia  . Tramadol   . Baclofen Nausea Only  . Drug Ingredient [Ganciclovir] Rash  . Meloxicam     Other reaction(s): Confusion  . Sulfamethoxazole Rash    Outpatient Encounter Medications as of 07/10/2019  Medication Sig  . acetaminophen (TYLENOL) 500 MG tablet Take 500 mg by mouth every 8 (eight) hours as needed for moderate pain.  Marland Kitchen albuterol (VENTOLIN HFA) 108 (90 Base) MCG/ACT inhaler Inhale 2 puffs into the lungs every 6 (six) hours as needed for wheezing or shortness of breath.  Marland Kitchen aspirin 81 MG EC tablet Take 81 mg by mouth daily.  Marland Kitchen azelastine (ASTELIN) 0.1 % nasal spray Place 1 spray into both nostrils 2 (two) times daily. Use in each nostril as directed (Patient taking differently: Place 1 spray into both nostrils 2 (two) times daily as needed for rhinitis. Use in each nostril as directed)  . B COMPLEX VITAMINS PO Take by mouth.  . clonazePAM (KLONOPIN) 1 MG tablet SMARTSIG:3 Tablet(s) By Mouth Every Other Day PRN  . cloNIDine (CATAPRES) 0.1 MG tablet Take 1 tablet (0.1 mg total) by mouth as needed. Use as needed for blood pressure greater than 160/100  . Erenumab-aooe (AIMOVIG) 140 MG/ML SOAJ Inject 140 mg into the skin  every 30 (thirty) days.  . felodipine (PLENDIL) 10 MG 24 hr tablet Take 1 tablet (10 mg total) by mouth daily.  . fluticasone (FLONASE) 50 MCG/ACT nasal spray USE TWO SPRAY(S) IN EACH NOSTRIL ONCE DAILY (Patient taking differently: Place 1 spray into both nostrils daily as needed for allergies. USE TWO SPRAY(S) IN EACH NOSTRIL ONCE DAILY AS NEEEDED)  . Fluticasone-Salmeterol (ADVAIR) 100-50 MCG/DOSE AEPB   . gabapentin (NEURONTIN) 800 MG tablet Take 1 tablet (800 mg total) by mouth 3 (three) times daily.  Marland Kitchen LACTOBACILLUS RHAMNOSUS, GG, PO Take by mouth.  . levalbuterol (XOPENEX HFA) 45 MCG/ACT inhaler Inhale into the lungs.  Marland Kitchen losartan (COZAAR) 100 MG  tablet Take 100 mg by mouth daily.  . metoprolol succinate (TOPROL-XL) 25 MG 24 hr tablet Take 25 mg by mouth daily.  . Multiple Vitamin (MULTIVITAMIN PO) Take by mouth.  . orphenadrine (NORFLEX) 100 MG tablet   . polyethylene glycol powder (GLYCOLAX/MIRALAX) powder Take 17 g by mouth 2 (two) times daily as needed. (Patient taking differently: Take 17 g by mouth 2 (two) times daily as needed for moderate constipation. )  . rosuvastatin (CRESTOR) 20 MG tablet TAKE ONE TABLET BY MOUTH EVERY EVENING. Please make overdue appt with Dr. Irish Lack before anymore refills. 2nd attempt  . Salicylic Acid-Urea [id number] % OINT Apply 1 application topically daily.  . temazepam (RESTORIL) 15 MG capsule   . zolpidem (AMBIEN) 10 MG tablet Take 2 tablets by mouth at bedtime.   . [DISCONTINUED] hyoscyamine (LEVBID) 0.375 MG 12 hr tablet Take 0.375 mg by mouth every 12 (twelve) hours as needed for cramping.  . [DISCONTINUED] metoprolol succinate (TOPROL-XL) 100 MG 24 hr tablet Take 100 mg by mouth daily. Take with or immediately following a meal.   No facility-administered encounter medications on file as of 07/10/2019.     Past Medical History:  Diagnosis Date  . Achilles tendinosis of left lower extremity 01/23/2017  . Anxiety   . Asthma   . Benign paroxysmal positional vertigo 01/05/2016  . Bilateral sensorineural hearing loss 12/22/2014  . Blepharitis of upper and lower eyelids of both eyes 04/27/2016  . Bradycardia 09/22/2014  . Cervicogenic headache 08/24/2016  . Chronic high back pain   . CKD (chronic kidney disease) 12/08/2014  . Cochlear implant in place   . Colon polyp   . Constipation   . Diverticulitis   . Diverticulosis   . DJD (degenerative joint disease)   . Double vision   . Dry eye syndrome of both lacrimal glands 04/27/2016  . Fibromyalgia   . Heart murmur   . High cholesterol   . Hypercalcemia   . Hypercholesterolemia   . Hyperlipidemia 02/01/2016  . Hypertension   . Hypoglycemia     . Insomnia 09/22/2010  . Kidney stone   . Meniscus tear    Right  . Migraine 08/07/2015  . Mild intermittent asthma without complication [id number]  . Nephrolithiasis 03/17/2014  . Nocturnal leg cramps 05/31/2019  . Osteoporosis   . Primary hyperparathyroidism ([place]) 12/10/2014  . PTSD (post-traumatic stress disorder)   . Renal cyst 05/12/2013  . Schizophrenia ([place])    treated by Dr. Casimiro Needle  . Schizophrenia ([place])   . Seizures ([place])    last seizure 3 years ago, not on AED  . Stroke ([place]) 07/08/11   "I've had mini strokes before; left side of face  is more down than right "  . Syncope and collapse    One episode - 07/10/15  . TIA (  transient ischemic attack)     Past Surgical History:  Procedure Laterality Date  . CATARACT EXTRACTION    . COCHLEAR IMPLANT  2010   left  . DILATION AND CURETTAGE OF UTERUS    . HERNIA REPAIR    . [place]   left  . KIDNEY STONE SURGERY  ~ 2006  . PARATHYROIDECTOMY    . skin cancer removal    . TONSILLECTOMY     "as a child"  . tooth removal    . WISDOM TOOTH EXTRACTION      Family History  Problem Relation Age of Onset  . Heart attack Father 57       Living  . Arthritis Father   . Skin cancer Father   . Hypertension Father   . Hyperlipidemia Father   . Leukemia Mother 57       Deceased  . Alzheimer's disease Paternal Aunt        X2  . Stomach cancer Paternal Aunt        x1  . Arthritis/Rheumatoid Paternal Aunt        x1  . Neuropathy Brother        Peripheal  . Fibromyalgia Sister   . HIV Sister   . Fibromyalgia Sister   . Arthritis/Rheumatoid Sister   . Diabetes Paternal Grandmother   . Thyroid disease Sister     Social History   Social History Narrative   Completed 2 years of college.    On disability.   Lives at home with her husband.   No caffeine use.   Left-handed.   Three children, 2 biological - 1 adopted.  Denies tobacco use  Review of Systems General: Denies fevers, chills, weight  loss CV: Denies chest pain, shortness of breath, palpitations  Physical Exam Vitals with BMI 07/10/2019 06/26/2019 05/31/2019  Height 5\' 4"  5\' 4"  5\' 4"   Weight 165 lbs 167 lbs 163 lbs 8 oz  BMI 28.31 0000000 [id number]  Systolic Q000111Q 0000000 [id number]  Diastolic 89 84 76  Pulse 92 76 72    General:  No acute distress,  Alert and oriented, Non-Toxic, Normal speech and affect Breast: She has grade 2 ptosis.  Sternal notch to nipple is 32 cm bilaterally.  Nipple to fold is 13 cm on the right and 12 cm on the left.  There is no obvious scars or masses. Abdomen: She has excess skin and subcutaneous tissue in the infraumbilical area that hangs over in a fold.  There is mild skin irritation in the crease beneath it.  I will detect any scars or hernias.  She also has fullness in the upper abdominal area.  Assessment/Plan The patient has bilateral symptomatic macromastia.  She is a good candidate for a breast reduction.  She is interested in pursuing surgical treatment.  The details of breast reduction surgery were discussed.  I explained the procedure in detail along the with the expected scars.  The risks were discussed in detail and include bleeding, infection, damage to surrounding structures, need for additional procedures, nipple loss, change in nipple sensation, persistent pain, contour irregularities and asymmetries.  I explained that breast feeding is often not possible after breast reduction surgery.  We discussed the expected postoperative course with an overall recovery period of about 1 month.  She demonstrated full understanding of all risks.  We discussed her personal risk factors that include her multiple medical comorbidities.  I anticipate approximately 500 g of tissue removed from each  side.  Regarding the abdomen I think she is a reasonably good candidate for a infraumbilical panniculectomy.  She does appear to have symptoms and skin irritation beneath the pannus.  I explained the procedure to her in  detail including the placement of the scars.  I discussed the risk that include bleeding, infection, damage and damage to surrounding structures and need for additional procedures.  I discussed the increased recovery time associated with combining this with her breast reduction.  I do believe it safe to undergo both procedures at the same time and she is going to think about this, but at the moment feels motivated to do both together.   Jasmine [name] 07/10/2019, 2:50 PM

## 2019-07-12 ENCOUNTER — Other Ambulatory Visit: Payer: Self-pay

## 2019-07-12 ENCOUNTER — Ambulatory Visit: Payer: 59 | Admitting: Registered Nurse

## 2019-07-12 ENCOUNTER — Encounter: Payer: Self-pay | Admitting: Registered Nurse

## 2019-07-12 ENCOUNTER — Ambulatory Visit (INDEPENDENT_AMBULATORY_CARE_PROVIDER_SITE_OTHER): Payer: 59 | Admitting: Registered Nurse

## 2019-07-12 VITALS — BP 169/81 | HR 69 | Temp 98.3°F | Resp 16 | Ht 64.0 in | Wt 165.4 lb

## 2019-07-12 DIAGNOSIS — L84 Corns and callosities: Secondary | ICD-10-CM

## 2019-07-12 NOTE — Progress Notes (Signed)
Acute Office Visit  Subjective:    Patient ID: Jasmine [name], female    DOB: Apr 05, 1955, 64 y.o.   MRN: [id number]  Chief Complaint  Patient presents with  . Foot Pain    patient states she is still having the same amount of pain with her foot since the last visit , and states it just not healing correctly.    HPI Patient is in today for ongoing foot pain  Has not healed, but no sign of infection, has not worsened. Still somewhat painful when it catches on things. No significant change since last visit.   Refuses podiatry referral at this time.   Past Medical History:  Diagnosis Date  . Achilles tendinosis of left lower extremity 01/23/2017  . Anxiety   . Asthma   . Benign paroxysmal positional vertigo 01/05/2016  . Bilateral sensorineural hearing loss 12/22/2014  . Blepharitis of upper and lower eyelids of both eyes 04/27/2016  . Bradycardia 09/22/2014  . Cervicogenic headache 08/24/2016  . Chronic high back pain   . CKD (chronic kidney disease) 12/08/2014  . Cochlear implant in place   . Colon polyp   . Constipation   . Diverticulitis   . Diverticulosis   . DJD (degenerative joint disease)   . Double vision   . Dry eye syndrome of both lacrimal glands 04/27/2016  . Fibromyalgia   . Heart murmur   . High cholesterol   . Hypercalcemia   . Hypercholesterolemia   . Hyperlipidemia 02/01/2016  . Hypertension   . Hypoglycemia   . Insomnia 09/22/2010  . Kidney stone   . Meniscus tear    Right  . Migraine 08/07/2015  . Mild intermittent asthma without complication [id number]  . Nephrolithiasis 03/17/2014  . Nocturnal leg cramps 05/31/2019  . Osteoporosis   . Primary hyperparathyroidism ([place]) 12/10/2014  . PTSD (post-traumatic stress disorder)   . Renal cyst 05/12/2013  . Schizophrenia ([place])    treated by Dr. Casimiro Needle  . Schizophrenia ([place])   . Seizures ([place])    last seizure 3 years ago, not on AED  . Stroke ([place]) 07/08/11   "I've had mini strokes before; left side  of face  is more down than right "  . Syncope and collapse    One episode - 07/10/15  . TIA (transient ischemic attack)     Past Surgical History:  Procedure Laterality Date  . CATARACT EXTRACTION    . COCHLEAR IMPLANT  2010   left  . DILATION AND CURETTAGE OF UTERUS    . HERNIA REPAIR    . [place]   left  . KIDNEY STONE SURGERY  ~ 2006  . PARATHYROIDECTOMY    . skin cancer removal    . TONSILLECTOMY     "as a child"  . tooth removal    . WISDOM TOOTH EXTRACTION      Family History  Problem Relation Age of Onset  . Heart attack Father 72       Living  . Arthritis Father   . Skin cancer Father   . Hypertension Father   . Hyperlipidemia Father   . Leukemia Mother 87       Deceased  . Alzheimer's disease Paternal Aunt        X2  . Stomach cancer Paternal Aunt        x1  . Arthritis/Rheumatoid Paternal Aunt        x1  . Neuropathy Brother  Peripheal  . Fibromyalgia Sister   . HIV Sister   . Fibromyalgia Sister   . Arthritis/Rheumatoid Sister   . Diabetes Paternal Grandmother   . Thyroid disease Sister     Social History   Socioeconomic History  . Marital status: Married    Spouse name: [name]  . Number of children: 3  . Years of education: 2 yrs coll  . Highest education level: Not on file  Occupational History  . Occupation: Disabled  Tobacco Use  . Smoking status: Former Smoker    Packs/day: 1.00    Years: 20.00    Pack years: 20.00    Types: Cigarettes    Quit date: 02/29/2004    Years since quitting: 15.3  . Smokeless tobacco: Never Used  Substance and Sexual Activity  . Alcohol use: Yes    Alcohol/week: 0.0 standard drinks    Comment: "glass of wine q once in awhile; not very often; do it on special occasion"  . Drug use: No  . Sexual activity: Never  Other Topics Concern  . Not on file  Social History Narrative   Completed 2 years of college.    On disability.   Lives at home with her husband.   No caffeine use.     Left-handed.   Three children, 2 biological - 1 adopted.   Social Determinants of Health   Financial Resource Strain:   . Difficulty of Paying Living Expenses:   Food Insecurity:   . Worried About Charity fundraiser in the Last Year:   . Arboriculturist in the Last Year:   Transportation Needs:   . Film/video editor (Medical):   Marland Kitchen Lack of Transportation (Non-Medical):   Physical Activity:   . Days of Exercise per Week:   . Minutes of Exercise per Session:   Stress:   . Feeling of Stress :   Social Connections:   . Frequency of Communication with Friends and Family:   . Frequency of Social Gatherings with Friends and Family:   . Attends Religious Services:   . Active Member of Clubs or Organizations:   . Attends Archivist Meetings:   Marland Kitchen Marital Status:   Intimate Partner Violence:   . Fear of Current or Ex-Partner:   . Emotionally Abused:   Marland Kitchen Physically Abused:   . Sexually Abused:     Outpatient Medications Prior to Visit  Medication Sig Dispense Refill  . acetaminophen (TYLENOL) 500 MG tablet Take 500 mg by mouth every 8 (eight) hours as needed for moderate pain.    Marland Kitchen albuterol (VENTOLIN HFA) 108 (90 Base) MCG/ACT inhaler Inhale 2 puffs into the lungs every 6 (six) hours as needed for wheezing or shortness of breath. 6.7 g 2  . aspirin 81 MG EC tablet Take 81 mg by mouth daily.    Marland Kitchen azelastine (ASTELIN) 0.1 % nasal spray Place 1 spray into both nostrils 2 (two) times daily. Use in each nostril as directed (Patient taking differently: Place 1 spray into both nostrils 2 (two) times daily as needed for rhinitis. Use in each nostril as directed) 30 mL 12  . B COMPLEX VITAMINS PO Take by mouth.    . clonazePAM (KLONOPIN) 1 MG tablet SMARTSIG:3 Tablet(s) By Mouth Every Other Day PRN    . cloNIDine (CATAPRES) 0.1 MG tablet Take 1 tablet (0.1 mg total) by mouth as needed. Use as needed for blood pressure greater than 160/100 30 tablet 3  . Erenumab-aooe ([place])  140 MG/ML SOAJ Inject 140 mg into the skin every 30 (thirty) days. 1.12 mg 4  . felodipine (PLENDIL) 10 MG 24 hr tablet Take 1 tablet (10 mg total) by mouth daily. 90 tablet 3  . fluticasone (FLONASE) 50 MCG/ACT nasal spray USE TWO SPRAY(S) IN EACH NOSTRIL ONCE DAILY (Patient taking differently: Place 1 spray into both nostrils daily as needed for allergies. USE TWO SPRAY(S) IN EACH NOSTRIL ONCE DAILY AS NEEEDED) 16 g 5  . Fluticasone-Salmeterol (ADVAIR) 100-50 MCG/DOSE AEPB     . gabapentin (NEURONTIN) 800 MG tablet Take 1 tablet (800 mg total) by mouth 3 (three) times daily. 270 tablet 1  . LACTOBACILLUS RHAMNOSUS, GG, PO Take by mouth.    . levalbuterol (XOPENEX HFA) 45 MCG/ACT inhaler Inhale into the lungs.    Marland Kitchen losartan (COZAAR) 100 MG tablet Take 100 mg by mouth daily.    . metoprolol succinate (TOPROL-XL) 25 MG 24 hr tablet Take 25 mg by mouth daily.    . Multiple Vitamin (MULTIVITAMIN PO) Take by mouth.    . orphenadrine (NORFLEX) 100 MG tablet     . polyethylene glycol powder (GLYCOLAX/MIRALAX) powder Take 17 g by mouth 2 (two) times daily as needed. (Patient taking differently: Take 17 g by mouth 2 (two) times daily as needed for moderate constipation. ) 3350 g 1  . rosuvastatin (CRESTOR) 20 MG tablet TAKE ONE TABLET BY MOUTH EVERY EVENING. Please make overdue appt with Dr. Irish Lack before anymore refills. 2nd attempt 15 tablet 0  . Salicylic Acid-Urea [id number] % OINT Apply 1 application topically daily. 30 g 0  . temazepam (RESTORIL) 15 MG capsule     . zolpidem (AMBIEN) 10 MG tablet Take 2 tablets by mouth at bedtime.      No facility-administered medications prior to visit.    Allergies  Allergen Reactions  . Amlodipine Shortness Of Breath    Pt states she also had very bad pain   . Sulfa Antibiotics Hives  . Famciclovir Other (See Comments)  . Metronidazole Nausea And Vomiting  . Soma [Carisoprodol] Other (See Comments)    Moody and sleepy Cognitive Function, Daytime sleepiness   . Acyclovir   . Acyclovir And Related   . Benadryl [Diphenhydramine Hcl]     Severe emotional reaction and doesn't work well with Pt. Past history  . Cephalosporins Hives  . Codeine Other (See Comments)    Makes patient feel odd ; "sometimes mild; sometimes severe reaction; mostly severe"  . Cyclobenzaprine Other (See Comments)    Muscle Aches.  . Diclofenac Sodium Nausea And Vomiting  . Dicyclomine   . Hydrocodone   . Hydrocodone-Acetaminophen   . Linzess [Linaclotide] Diarrhea  . Nitrofurantoin Monohyd Macro     Felt funny  . Suboxone [Buprenorphine Hcl-Naloxone Hcl] Nausea And Vomiting  . Tizanidine     Other reaction(s): Other (See Comments) insomnia  . Tramadol   . Baclofen Nausea Only  . Drug Ingredient [Ganciclovir] Rash  . Meloxicam     Other reaction(s): Confusion  . Sulfamethoxazole Rash    Review of Systems  Constitutional: Negative.   HENT: Negative.   Eyes: Negative.   Respiratory: Negative.   Cardiovascular: Negative.   Gastrointestinal: Negative.   Endocrine: Negative.   Genitourinary: Negative.   Musculoskeletal: Negative.   Skin: Positive for wound (cracked, peeling callus on bottom of joint of first toe). Negative for color change, pallor and rash.  Allergic/Immunologic: Negative.   Neurological: Negative.   Hematological: Negative.   Psychiatric/Behavioral: Negative.  All other systems reviewed and are negative.      Objective:    Physical Exam Vitals and nursing note reviewed.  Constitutional:      General: She is not in acute distress.    Appearance: Normal appearance. She is normal weight. She is not ill-appearing, toxic-appearing or diaphoretic.  Cardiovascular:     Rate and Rhythm: Normal rate and regular rhythm.  Pulmonary:     Effort: Pulmonary effort is normal. No respiratory distress.  Skin:    General: Skin is warm and dry.     Capillary Refill: Capillary refill takes less than 2 seconds.     Findings: Lesion (cracked callus  on bottom of joint of great toe of left foot) present.  Neurological:     General: No focal deficit present.     Mental Status: She is alert and oriented to person, place, and time. Mental status is at baseline.     Cranial Nerves: No cranial nerve deficit.     Motor: No weakness.  Psychiatric:        Mood and Affect: Mood normal.        Behavior: Behavior normal.        Thought Content: Thought content normal.        Judgment: Judgment normal.     BP (!) 169/81   Pulse 69   Temp 98.3 F (36.8 C) (Temporal)   Resp 16   Ht 5\' 4"  (1.626 m)   Wt 165 lb 6.4 oz (75 kg)   SpO2 97%   BMI 28.39 kg/m  Wt Readings from Last 3 Encounters:  07/12/19 165 lb 6.4 oz (75 kg)  07/10/19 165 lb (74.8 kg)  06/26/19 167 lb (75.8 kg)    There are no preventive care reminders to display for this patient.  There are no preventive care reminders to display for this patient.   Lab Results  Component Value Date   TSH 2.350 08/03/2018   Lab Results  Component Value Date   WBC 7.4 08/03/2018   HGB 15.3 (H) 02/13/2019   HCT 45.0 02/13/2019   MCV 87 08/03/2018   PLT 235 08/03/2018   Lab Results  Component Value Date   NA 143 02/13/2019   K 3.7 02/13/2019   CO2 22 08/03/2018   GLUCOSE 132 (H) 02/13/2019   BUN 26 (H) 02/13/2019   CREATININE 0.60 02/13/2019   BILITOT 0.5 08/03/2018   ALKPHOS 65 08/03/2018   AST 17 08/03/2018   ALT 10 08/03/2018   PROT 6.3 08/03/2018   ALBUMIN 4.2 08/03/2018   CALCIUM 9.6 08/03/2018   ANIONGAP 11 01/01/2017   GFR 133.89 12/26/2016   Lab Results  Component Value Date   CHOL 165 08/03/2018   Lab Results  Component Value Date   HDL 58 08/03/2018   Lab Results  Component Value Date   LDLCALC 97 08/03/2018   Lab Results  Component Value Date   TRIG 50 08/03/2018   Lab Results  Component Value Date   CHOLHDL 2.8 08/03/2018   Lab Results  Component Value Date   HGBA1C 5.8 (H) 08/03/2018       Assessment & Plan:   Problem List Items  Addressed This Visit    None    Visit Diagnoses    Corns and callus    -  Primary       No orders of the defined types were placed in this encounter.  PLAN  No longer a deep wound, no bleeding or  granular tissue noted inside callus  Was able to remove with scissors and scapula without compromising skin integrity  Pt feels much relief, will continue to moisturize and monitor feet for wounds  Patient encouraged to call clinic with any questions, comments, or concerns.   Maximiano Coss, NP

## 2019-07-12 NOTE — Patient Instructions (Signed)
° ° ° °  If you have lab work done today you will be contacted with your lab results within the next 2 weeks.  If you have not heard from us then please contact us. The fastest way to get your results is to register for My Chart. ° ° °IF you received an x-ray today, you will receive an invoice from [facility]. Please contact [facility] at [phone] with questions or concerns regarding your invoice.  ° °IF you received labwork today, you will receive an invoice from [facility]. Please contact [facility] at [phone] with questions or concerns regarding your invoice.  ° °Our billing staff will not be able to assist you with questions regarding bills from these companies. ° °You will be contacted with the lab results as soon as they are available. The fastest way to get your results is to activate your My Chart account. Instructions are located on the last page of this paperwork. If you have not heard from us regarding the results in 2 weeks, please contact this office. °  ° ° ° °

## 2019-07-15 ENCOUNTER — Other Ambulatory Visit: Payer: Self-pay | Admitting: Registered Nurse

## 2019-07-15 MED ORDER — METOPROLOL SUCCINATE ER 25 MG PO TB24: 25.0000 mg | ORAL_TABLET | Freq: Every day | ORAL | 3 refills | Status: DC

## 2019-07-15 NOTE — Telephone Encounter (Signed)
Patient is requesting a refill of the following medications: Requested Prescriptions   Pending Prescriptions Disp Refills  . metoprolol succinate (TOPROL-XL) 25 MG 24 hr tablet      Sig: Take 1 tablet (25 mg total) by mouth daily. Take 25 mg by mouth daily.    Date of patient request: 07/15/2019 Last office visit: 07/12/2019 Date of last refill: 06/14/2019 Last refill amount:  Follow up time period per chart: Follow up appointment scheduled

## 2019-07-15 NOTE — Telephone Encounter (Signed)
metoprolol succinate (TOPROL-XL) 25 MG 24 hr tablet    Patient is requesting refill.    Pharmacy:  [facility] - [place], [address] Phone:  [phone]  Fax:  [phone]

## 2019-07-31 ENCOUNTER — Telehealth: Payer: Self-pay | Admitting: Plastic Surgery

## 2019-07-31 NOTE — Telephone Encounter (Signed)
Received notification from  [facility] that surgery request has been denied as not medically necessary. Further explanation will be provided once the letter is generated. I called the patient to inform her of this information, and explained that once we receive the letter, I will know how/if we can appeal the decision. The patient expressed her frustration, but I urged her to contact us if she receives the letter before we get a copy, and that I will be happy to review the details. She expressed understanding and will await for the denial letter from [facility].

## 2019-08-04 IMAGING — DX DG FOOT COMPLETE 3+V*L*
3 series · 3 of 3 positions shown · non-contrast
Comparison: No recent prior .

CLINICAL DATA: Left ankle foot pain.  No known injury.

EXAM:
LEFT FOOT - COMPLETE 3+ VIEW

[foot ap]
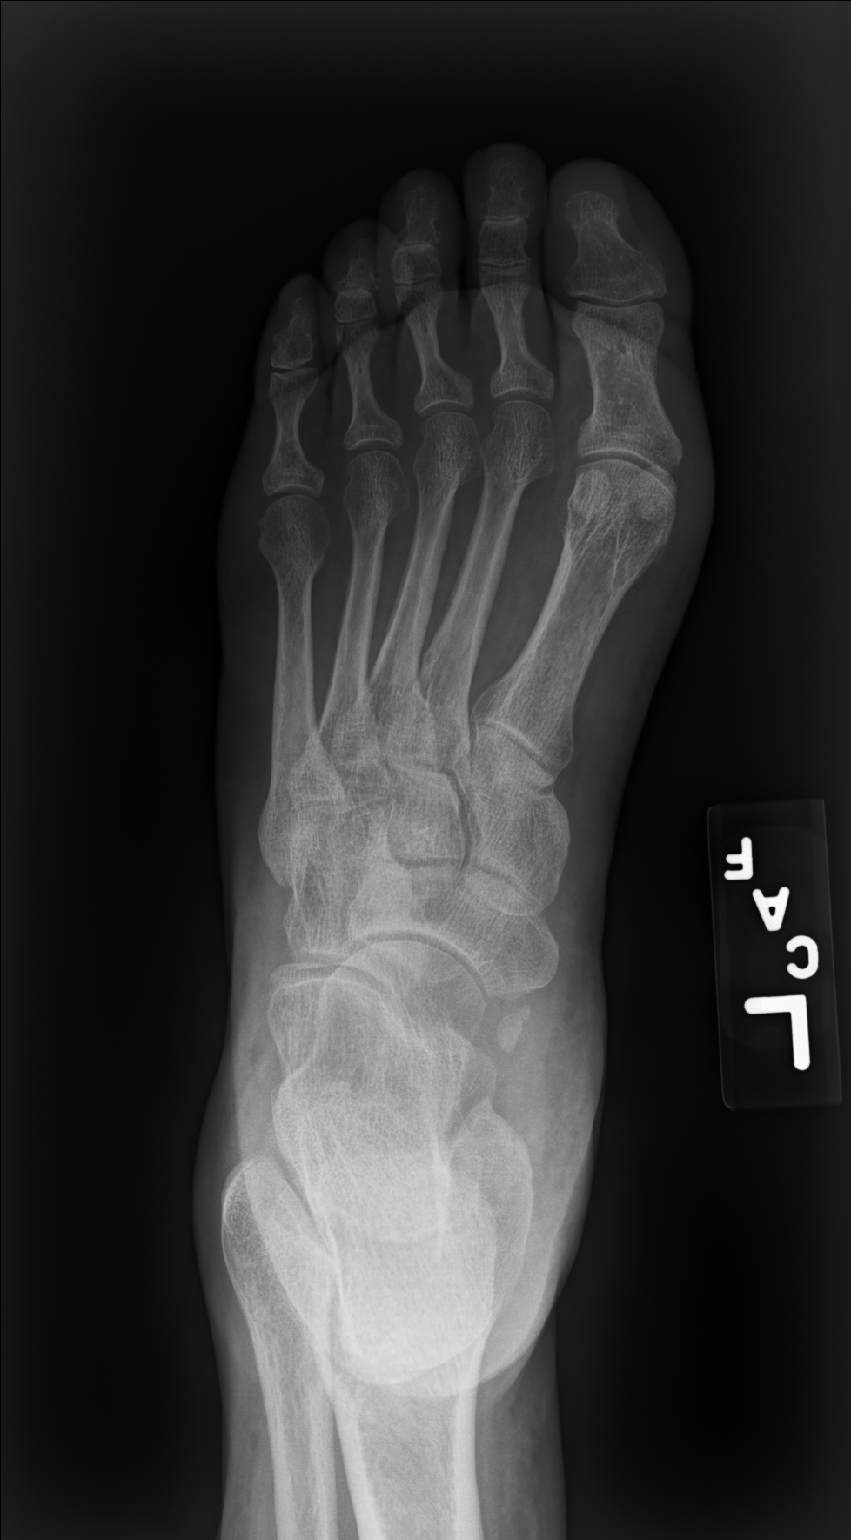

[foot oblique]
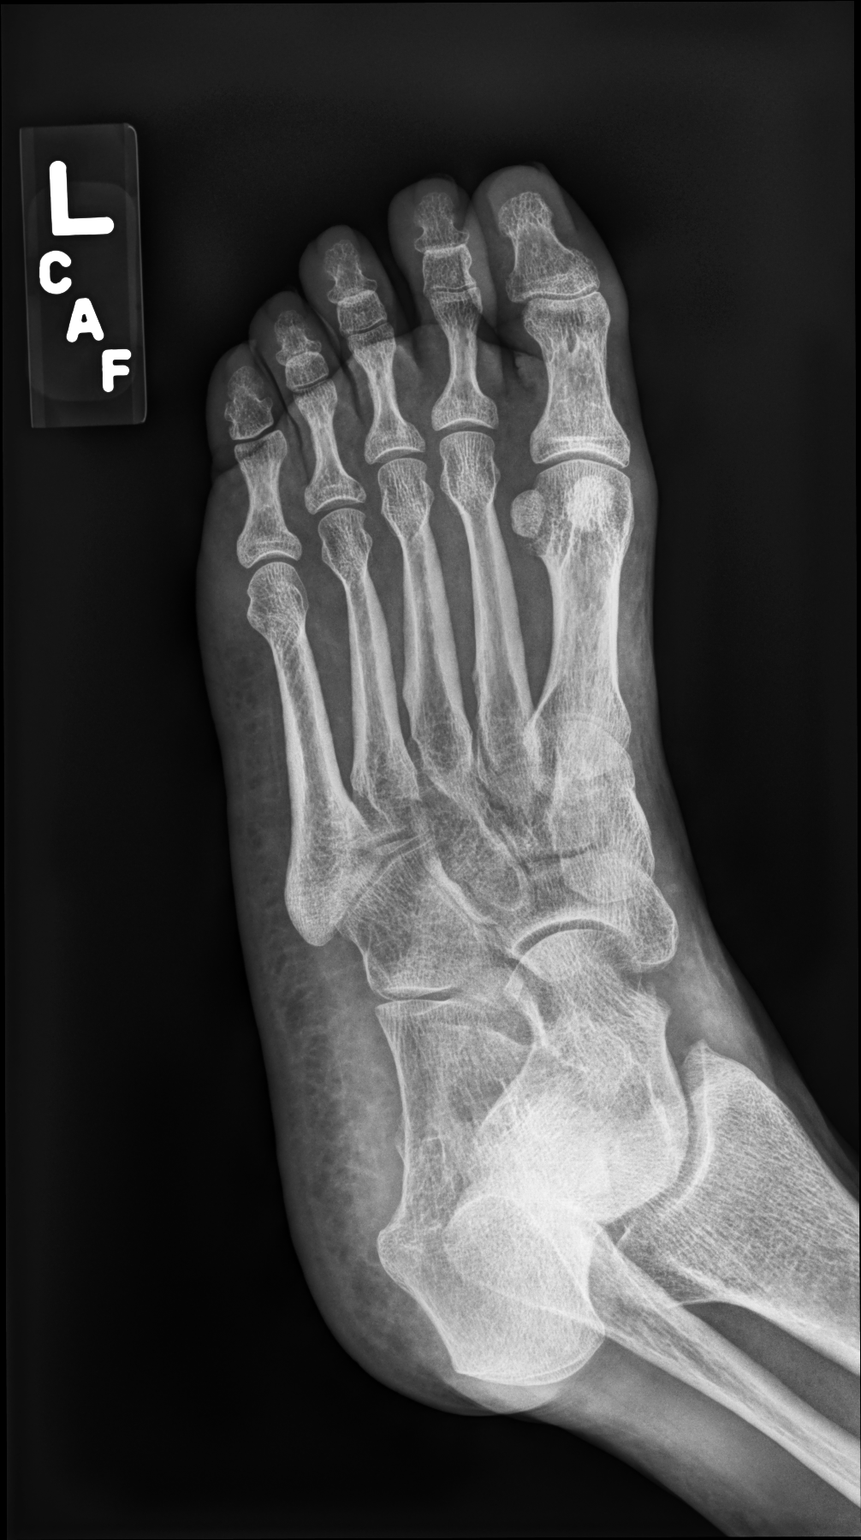

[foot lat]
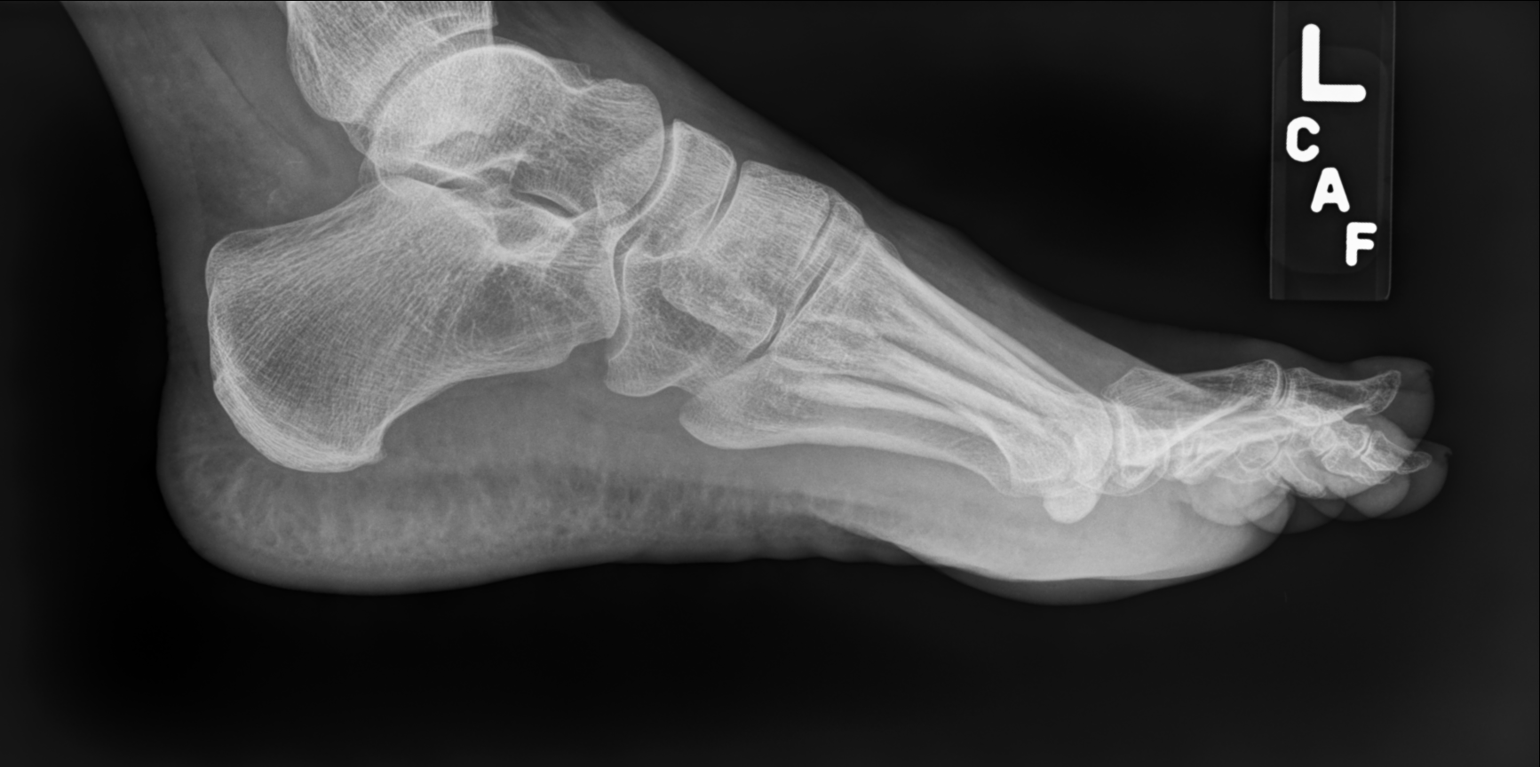

[3 of 3 positions shown; findings below may reference images not displayed]

FINDINGS: No acute bony or joint abnormality identified. No evidence of
fracture dislocation. Diffuse soft tissue swelling.
IMPRESSION: Diffuse soft tissue swelling.  No acute bony abnormality.

## 2019-08-14 ENCOUNTER — Ambulatory Visit (INDEPENDENT_AMBULATORY_CARE_PROVIDER_SITE_OTHER): Payer: 59 | Admitting: *Deleted

## 2019-08-14 DIAGNOSIS — G43009 Migraine without aura, not intractable, without status migrainosus: Secondary | ICD-10-CM

## 2019-08-14 NOTE — Progress Notes (Signed)
Pt here for Aimovig 140mg  injection.  Under aseptic technique aimovig 140mg  /ml auto injector  given R upperback of arm (subcutaneous) .  Tolerated well.  Bandaid applied.  I went thru process with pt.  She feels comfortable doing it to herself now.  She had watched video.  I relayed multiple sites (abdomen, upper outer thigh).  She will call back if questions.  Used her own supply. AIMOVIG 140mg  [place] [id number] Exp 07/2020.

## 2019-08-28 ENCOUNTER — Telehealth: Payer: Self-pay | Admitting: Registered Nurse

## 2019-08-28 NOTE — Telephone Encounter (Signed)
Copied from [place] [id number]. Topic: Quick Communication - Rx Refill/Question >> Aug 28, 2019 11:56 AM Leward Quan A wrote: Medication: losartan (COZAAR) 100 MG tablet   Has the patient contacted their pharmacy? Yes.   (Agent: If no, request that the patient contact the pharmacy for the refill.) (Agent: If yes, when and what did the pharmacy advise?)  Preferred Pharmacy (with phone number or street name): [place], [address]. [address]  Phone:  [phone] Fax:  [phone]     Agent: Please be advised that RX refills may take up to 3 business days. We ask that you follow-up with your pharmacy.

## 2019-08-30 ENCOUNTER — Other Ambulatory Visit: Payer: Self-pay | Admitting: Registered Nurse

## 2019-08-30 MED ORDER — LOSARTAN POTASSIUM 100 MG PO TABS: 100.0000 mg | ORAL_TABLET | Freq: Every day | ORAL | 2 refills | Status: DC

## 2019-08-30 NOTE — Telephone Encounter (Signed)
Medication: losartan (COZAAR) 100 MG tablet [[id number]] Dr Cherlyn Labella was the original prescribing medication. Patient is now a patient Jasmine [name]. New Patient appt 06/25/18.   Has the patient contacted their pharmacy? Yes  (Agent: If no, request that the patient contact the pharmacy for the refill.) (Agent: If yes, when and what did the pharmacy advise?)  Preferred Pharmacy (with phone number or street name): [place], [address] [place]. [address]  Phone:  [phone] Fax:  [phone]     Agent: Please be advised that RX refills may take up to 3 business days. We ask that you follow-up with your pharmacy.

## 2019-09-04 ENCOUNTER — Ambulatory Visit: Payer: 59 | Admitting: Neurology

## 2019-09-23 ENCOUNTER — Other Ambulatory Visit: Payer: Self-pay | Admitting: Registered Nurse

## 2019-09-23 DIAGNOSIS — J3089 Other allergic rhinitis: Secondary | ICD-10-CM

## 2019-09-23 DIAGNOSIS — J302 Other seasonal allergic rhinitis: Secondary | ICD-10-CM

## 2019-09-24 ENCOUNTER — Telehealth: Payer: Self-pay | Admitting: Registered Nurse

## 2019-09-24 ENCOUNTER — Other Ambulatory Visit: Payer: Self-pay

## 2019-09-24 DIAGNOSIS — J302 Other seasonal allergic rhinitis: Secondary | ICD-10-CM

## 2019-09-24 DIAGNOSIS — J3089 Other allergic rhinitis: Secondary | ICD-10-CM

## 2019-09-24 MED ORDER — FLUTICASONE PROPIONATE 50 MCG/ACT NA SUSP: NASAL | 5 refills | Status: DC

## 2019-09-24 MED ORDER — ROSUVASTATIN CALCIUM 20 MG PO TABS: ORAL_TABLET | ORAL | 0 refills | Status: DC

## 2019-09-24 MED ORDER — ALBUTEROL SULFATE HFA 108 (90 BASE) MCG/ACT IN AERS: 2.0000 | INHALATION_SPRAY | Freq: Four times a day (QID) | RESPIRATORY_TRACT | 2 refills | Status: DC | PRN

## 2019-09-24 NOTE — Telephone Encounter (Signed)
Pt called and stated she would like to speak to provider. Pt stated that its regarding her medication. Pt stated she only wants to speak to provider directly. Please advise.

## 2019-09-24 NOTE — Telephone Encounter (Signed)
Called pt who was upset about not receiving refills, discussed with her and refilled appropriate meds

## 2019-10-04 ENCOUNTER — Telehealth: Payer: Self-pay | Admitting: Registered Nurse

## 2019-10-04 ENCOUNTER — Encounter: Payer: Self-pay | Admitting: Registered Nurse

## 2019-10-04 ENCOUNTER — Other Ambulatory Visit: Payer: Self-pay

## 2019-10-04 ENCOUNTER — Ambulatory Visit (INDEPENDENT_AMBULATORY_CARE_PROVIDER_SITE_OTHER): Payer: 59 | Admitting: Registered Nurse

## 2019-10-04 VITALS — BP 131/79 | HR 66 | Temp 98.2°F | Resp 18 | Ht 64.0 in | Wt 170.8 lb

## 2019-10-04 DIAGNOSIS — I10 Essential (primary) hypertension: Secondary | ICD-10-CM | POA: Diagnosis not present

## 2019-10-04 DIAGNOSIS — Z0001 Encounter for general adult medical examination with abnormal findings: Secondary | ICD-10-CM | POA: Diagnosis not present

## 2019-10-04 DIAGNOSIS — Z130 Encounter for screening for diseases of the blood and blood-forming organs and certain disorders involving the immune mechanism: Secondary | ICD-10-CM

## 2019-10-04 DIAGNOSIS — R202 Paresthesia of skin: Secondary | ICD-10-CM

## 2019-10-04 DIAGNOSIS — M545 Low back pain, unspecified: Secondary | ICD-10-CM

## 2019-10-04 DIAGNOSIS — Z1329 Encounter for screening for other suspected endocrine disorder: Secondary | ICD-10-CM | POA: Diagnosis not present

## 2019-10-04 DIAGNOSIS — G8929 Other chronic pain: Secondary | ICD-10-CM

## 2019-10-04 DIAGNOSIS — Z13228 Encounter for screening for other metabolic disorders: Secondary | ICD-10-CM

## 2019-10-04 DIAGNOSIS — Z8639 Personal history of other endocrine, nutritional and metabolic disease: Secondary | ICD-10-CM

## 2019-10-04 DIAGNOSIS — Z0000 Encounter for general adult medical examination without abnormal findings: Secondary | ICD-10-CM

## 2019-10-04 MED ORDER — ORPHENADRINE CITRATE ER 100 MG PO TB12: 100.0000 mg | ORAL_TABLET | Freq: Two times a day (BID) | ORAL | 1 refills | Status: DC | PRN

## 2019-10-04 NOTE — Telephone Encounter (Signed)
Pt just left our office and needs refill on   rosuvastatin (CRESTOR) 20 MG tablet   Jasmine [name] has never prescribed before  And she forgot to ask for refill on this today   Preferred pharmacy  [place], [address]. [address], [place] [address]  Phone:  [phone] Fax:  [phone]  DEA #:  --

## 2019-10-04 NOTE — Patient Instructions (Signed)
° ° ° °  If you have lab work done today you will be contacted with your lab results within the next 2 weeks.  If you have not heard from us then please contact us. The fastest way to get your results is to register for My Chart. ° ° °IF you received an x-ray today, you will receive an invoice from [facility]. Please contact [facility] at [phone] with questions or concerns regarding your invoice.  ° °IF you received labwork today, you will receive an invoice from [facility]. Please contact [facility] at [phone] with questions or concerns regarding your invoice.  ° °Our billing staff will not be able to assist you with questions regarding bills from these companies. ° °You will be contacted with the lab results as soon as they are available. The fastest way to get your results is to activate your My Chart account. Instructions are located on the last page of this paperwork. If you have not heard from us regarding the results in 2 weeks, please contact this office. °  ° ° ° °

## 2019-10-04 NOTE — Telephone Encounter (Signed)
Yes - can do this. Will send shortly.  Thanks,  Kathrin Ruddy, NP

## 2019-10-04 NOTE — Progress Notes (Signed)
Established Patient Office Visit  Subjective:  Patient ID: OYUKI HOGAN, female    DOB: 09-May-1955  Age: 64 y.o. MRN: [id number]  CC:  Chief Complaint  Patient presents with   Follow-up    patient states she is here for 3 month follow up and also Labs. Patient has no other questions or concerns at this time.    HPI LEIGHANNE ADOLPH presents for cpe and labs   no complaints  Feet are doing well. Back pain is baseline. Was seen recently at urgent care for sinus infection - improving dramatically.  Needs refill on norflex - takes 100mg  PO bid PRN for lower back pain  Past Medical History:  Diagnosis Date   Achilles tendinosis of left lower extremity 01/23/2017   Anxiety    Asthma    Benign paroxysmal positional vertigo 01/05/2016   Bilateral sensorineural hearing loss 12/22/2014   Blepharitis of upper and lower eyelids of both eyes 04/27/2016   Bradycardia 09/22/2014   Cervicogenic headache 08/24/2016   Chronic high back pain    CKD (chronic kidney disease) 12/08/2014   Cochlear implant in place    Colon polyp    Constipation    Diverticulitis    Diverticulosis    DJD (degenerative joint disease)    Double vision    Dry eye syndrome of both lacrimal glands 04/27/2016   Fibromyalgia    Heart murmur    High cholesterol    Hypercalcemia    Hypercholesterolemia    Hyperlipidemia 02/01/2016   Hypertension    Hypoglycemia    Insomnia 09/22/2010   Kidney stone    Meniscus tear    Right   Migraine 08/07/2015   Mild intermittent asthma without complication 23/95/3202   Nephrolithiasis 03/17/2014   Nocturnal leg cramps 05/31/2019   Osteoporosis    Primary hyperparathyroidism ([place]) 12/10/2014   PTSD (post-traumatic stress disorder)    Renal cyst 05/12/2013   Schizophrenia ([place])    treated by Dr. Casimiro Needle   Schizophrenia [facility])    Seizures ([place])    last seizure 3 years ago, not on AED   Stroke ([place]) 07/08/11   "I've had mini  strokes before; left side of face  is more down than right "   Syncope and collapse    One episode - 07/10/15   TIA (transient ischemic attack)     Past Surgical History:  Procedure Laterality Date   CATARACT EXTRACTION     COCHLEAR IMPLANT  2010   left   DILATION AND CURETTAGE OF UTERUS     HERNIA REPAIR     INGUINAL HERNIA REPAIR  1985   left   KIDNEY STONE SURGERY  ~ 2006   PARATHYROIDECTOMY     skin cancer removal     TONSILLECTOMY     "as a child"   tooth removal     WISDOM TOOTH EXTRACTION      Family History  Problem Relation Age of Onset   Heart attack Father 84       Living   Arthritis Father    Skin cancer Father    Hypertension Father    Hyperlipidemia Father    Leukemia Mother 23       Deceased   Alzheimer's disease Paternal Aunt        X2   Stomach cancer Paternal Aunt        x1   Arthritis/Rheumatoid Paternal Aunt        x1   Neuropathy Brother  Peripheal   Fibromyalgia Sister    HIV Sister    Fibromyalgia Sister    Arthritis/Rheumatoid Sister    Diabetes Paternal Grandmother    Thyroid disease Sister     Social History   Socioeconomic History   Marital status: Married    Spouse name: [name]   Number of children: 3   Years of education: 2 yrs coll   Highest education level: Not on file  Occupational History   Occupation: Disabled  Tobacco Use   Smoking status: Former Smoker    Packs/day: 1.00    Years: 20.00    Pack years: 20.00    Types: Cigarettes    Quit date: 02/29/2004    Years since quitting: 15.6   Smokeless tobacco: Never Used  Vaping Use   Vaping Use: Never used  Substance and Sexual Activity   Alcohol use: Yes    Alcohol/week: 0.0 standard drinks    Comment: "glass of wine q once in awhile; not very often; do it on special occasion"   Drug use: No   Sexual activity: Never  Other Topics Concern   Not on file  Social History Narrative   Completed 2 years of college.    On  disability.   Lives at home with her husband.   No caffeine use.   Left-handed.   Three children, 2 biological - 1 adopted.   Social Determinants of Health   Financial Resource Strain:    Difficulty of Paying Living Expenses:   Food Insecurity:    Worried About Charity fundraiser in the Last Year:    Arboriculturist in the Last Year:   Transportation Needs:    Film/video editor (Medical):    Lack of Transportation (Non-Medical):   Physical Activity:    Days of Exercise per Week:    Minutes of Exercise per Session:   Stress:    Feeling of Stress :   Social Connections:    Frequency of Communication with Friends and Family:    Frequency of Social Gatherings with Friends and Family:    Attends Religious Services:    Active Member of Clubs or Organizations:    Attends Music therapist:    Marital Status:   Intimate Partner Violence:    Fear of Current or Ex-Partner:    Emotionally Abused:    Physically Abused:    Sexually Abused:     Outpatient Medications Prior to Visit  Medication Sig Dispense Refill   acetaminophen (TYLENOL) 500 MG tablet Take 500 mg by mouth every 8 (eight) hours as needed for moderate pain.     albuterol (VENTOLIN HFA) 108 (90 Base) MCG/ACT inhaler Inhale 2 puffs into the lungs every 6 (six) hours as needed for wheezing or shortness of breath. 6.7 g 2   aspirin 81 MG EC tablet Take 81 mg by mouth daily.     azelastine (ASTELIN) 0.1 % nasal spray Place 1 spray into both nostrils 2 (two) times daily. Use in each nostril as directed (Patient taking differently: Place 1 spray into both nostrils 2 (two) times daily as needed for rhinitis. Use in each nostril as directed) 30 mL 12   B COMPLEX VITAMINS PO Take by mouth.     clonazePAM (KLONOPIN) 1 MG tablet SMARTSIG:3 Tablet(s) By Mouth Every Other Day PRN     cloNIDine (CATAPRES) 0.1 MG tablet Take 1 tablet (0.1 mg total) by mouth as needed. Use as needed for blood  pressure greater than  160/100 30 tablet 3   fluticasone (FLONASE) 50 MCG/ACT nasal spray USE TWO SPRAY(S) IN EACH NOSTRIL ONCE DAILY 16 g 5   Fluticasone-Salmeterol (ADVAIR) 100-50 MCG/DOSE AEPB      gabapentin (NEURONTIN) 800 MG tablet Take 1 tablet (800 mg total) by mouth 3 (three) times daily. 270 tablet 1   LACTOBACILLUS RHAMNOSUS, GG, PO Take by mouth.     levalbuterol (XOPENEX HFA) 45 MCG/ACT inhaler Inhale into the lungs.     losartan (COZAAR) 100 MG tablet Take 1 tablet (100 mg total) by mouth daily. 30 tablet 2   metoprolol succinate (TOPROL-XL) 25 MG 24 hr tablet Take 1 tablet (25 mg total) by mouth daily. Take 25 mg by mouth daily. 90 tablet 3   Multiple Vitamin (MULTIVITAMIN PO) Take by mouth.     polyethylene glycol powder (GLYCOLAX/MIRALAX) powder Take 17 g by mouth 2 (two) times daily as needed. (Patient taking differently: Take 17 g by mouth 2 (two) times daily as needed for moderate constipation. ) 3350 g 1   rosuvastatin (CRESTOR) 20 MG tablet TAKE ONE TABLET BY MOUTH EVERY EVENING. Please make overdue appt with Dr. Irish Lack before anymore refills. 2nd attempt 15 tablet 0   Salicylic Acid-Urea 4-01 % OINT Apply 1 application topically daily. 30 g 0   temazepam (RESTORIL) 15 MG capsule      zolpidem (AMBIEN) 10 MG tablet Take 2 tablets by mouth at bedtime.      orphenadrine (NORFLEX) 100 MG tablet      Erenumab-aooe (AIMOVIG) 140 MG/ML SOAJ Inject 140 mg into the skin every 30 (thirty) days. 1.12 mg 4   felodipine (PLENDIL) 10 MG 24 hr tablet Take 1 tablet (10 mg total) by mouth daily. 90 tablet 3   No facility-administered medications prior to visit.    Allergies  Allergen Reactions   Amlodipine Shortness Of Breath    Pt states she also had very bad pain    Sulfa Antibiotics Hives   Famciclovir Other (See Comments)   Metronidazole Nausea And Vomiting   Soma [Carisoprodol] Other (See Comments)    Moody and sleepy Cognitive Function, Daytime  sleepiness   Acyclovir    Acyclovir And Related    Benadryl [Diphenhydramine Hcl]     Severe emotional reaction and doesn't work well with Pt. Past history   Cephalosporins Hives   Codeine Other (See Comments)    Makes patient feel odd ; "sometimes mild; sometimes severe reaction; mostly severe"   Cyclobenzaprine Other (See Comments)    Muscle Aches.   Diclofenac Sodium Nausea And Vomiting   Dicyclomine    Hydrocodone    Hydrocodone-Acetaminophen    Linzess [Linaclotide] Diarrhea   Nitrofurantoin Monohyd Macro     Felt funny   Suboxone [Buprenorphine Hcl-Naloxone Hcl] Nausea And Vomiting   Tizanidine     Other reaction(s): Other (See Comments) insomnia   Tramadol    Baclofen Nausea Only   Drug Ingredient [Ganciclovir] Rash   Meloxicam     Other reaction(s): Confusion   Sulfamethoxazole Rash    ROS Review of Systems  Constitutional: Negative.   HENT: Negative.   Eyes: Negative.   Respiratory: Negative.   Cardiovascular: Negative.   Gastrointestinal: Negative.   Endocrine: Negative.   Genitourinary: Negative.   Musculoskeletal: Positive for back pain. Negative for arthralgias, gait problem, joint swelling, myalgias, neck pain and neck stiffness.  Skin: Negative.   Allergic/Immunologic: Negative.   Neurological: Negative.   Hematological: Negative.   Psychiatric/Behavioral: Negative.   All other  systems reviewed and are negative.     Objective:    Physical Exam Vitals and nursing note reviewed.  Constitutional:      General: She is not in acute distress.    Appearance: Normal appearance. She is normal weight. She is not ill-appearing, toxic-appearing or diaphoretic.  HENT:     Head: Normocephalic and atraumatic.     Right Ear: Tympanic membrane, ear canal and external ear normal. There is no impacted cerumen.     Left Ear: Tympanic membrane, ear canal and external ear normal. There is no impacted cerumen.     Nose: Nose normal. No congestion  or rhinorrhea.     Mouth/Throat:     Mouth: Mucous membranes are moist.     Pharynx: Oropharynx is clear. No oropharyngeal exudate or posterior oropharyngeal erythema.  Eyes:     General: No scleral icterus.       Right eye: No discharge.        Left eye: No discharge.     Extraocular Movements: Extraocular movements intact.     Conjunctiva/sclera: Conjunctivae normal.     Pupils: Pupils are equal, round, and reactive to light.  Neck:     Vascular: No carotid bruit.  Cardiovascular:     Rate and Rhythm: Normal rate and regular rhythm.     Pulses: Normal pulses.     Heart sounds: Normal heart sounds. No murmur heard.  No friction rub. No gallop.   Pulmonary:     Effort: Pulmonary effort is normal. No respiratory distress.     Breath sounds: Normal breath sounds. No stridor. No wheezing, rhonchi or rales.  Chest:     Chest wall: No tenderness.  Abdominal:     General: Abdomen is flat. Bowel sounds are normal. There is no distension.     Palpations: Abdomen is soft. There is no mass.     Tenderness: There is no abdominal tenderness. There is no guarding.  Musculoskeletal:        General: Tenderness (lower back, baseline) present. No swelling, deformity or signs of injury. Normal range of motion.     Cervical back: Normal range of motion. Rigidity (baseline) present. No tenderness.     Right lower leg: No edema.     Left lower leg: No edema.  Lymphadenopathy:     Cervical: No cervical adenopathy.  Skin:    General: Skin is warm and dry.     Capillary Refill: Capillary refill takes less than 2 seconds.     Coloration: Skin is not jaundiced or pale.     Findings: No bruising, erythema, lesion or rash.  Neurological:     General: No focal deficit present.     Mental Status: She is alert and oriented to person, place, and time. Mental status is at baseline.     Cranial Nerves: No cranial nerve deficit.     Sensory: No sensory deficit.     Motor: No weakness.     Coordination:  Coordination normal.     Gait: Gait normal.     Deep Tendon Reflexes: Reflexes normal.  Psychiatric:        Mood and Affect: Mood normal.        Behavior: Behavior normal.        Thought Content: Thought content normal.        Judgment: Judgment normal.     BP 131/79    Pulse 66    Temp 98.2 F (36.8 C) (Temporal)    Resp  18    Ht 5\' 4"  (1.626 m)    Wt 170 lb 12.8 oz (77.5 kg)    SpO2 95%    BMI 29.32 kg/m  Wt Readings from Last 3 Encounters:  10/04/19 170 lb 12.8 oz (77.5 kg)  07/12/19 165 lb 6.4 oz (75 kg)  07/10/19 165 lb (74.8 kg)     Health Maintenance Due  Topic Date Due   INFLUENZA VACCINE  09/29/2019    There are no preventive care reminders to display for this patient.  Lab Results  Component Value Date   TSH 2.350 08/03/2018   Lab Results  Component Value Date   WBC 7.4 08/03/2018   HGB 15.3 (H) 02/13/2019   HCT 45.0 02/13/2019   MCV 87 08/03/2018   PLT 235 08/03/2018   Lab Results  Component Value Date   NA 143 02/13/2019   K 3.7 02/13/2019   CO2 22 08/03/2018   GLUCOSE 132 (H) 02/13/2019   BUN 26 (H) 02/13/2019   CREATININE 0.60 02/13/2019   BILITOT 0.5 08/03/2018   ALKPHOS 65 08/03/2018   AST 17 08/03/2018   ALT 10 08/03/2018   PROT 6.3 08/03/2018   ALBUMIN 4.2 08/03/2018   CALCIUM 9.6 08/03/2018   ANIONGAP 11 01/01/2017   GFR 133.89 12/26/2016   Lab Results  Component Value Date   CHOL 165 08/03/2018   Lab Results  Component Value Date   HDL 58 08/03/2018   Lab Results  Component Value Date   LDLCALC 97 08/03/2018   Lab Results  Component Value Date   TRIG 50 08/03/2018   Lab Results  Component Value Date   CHOLHDL 2.8 08/03/2018   Lab Results  Component Value Date   HGBA1C 5.8 (H) 08/03/2018      Assessment & Plan:   Problem List Items Addressed This Visit      Cardiovascular and Mediastinum   Essential hypertension   Relevant Orders   Lipid panel   Comprehensive metabolic panel   CBC with Differential      Other   Chronic low back pain   Relevant Medications   orphenadrine (NORFLEX) 100 MG tablet    Other Visit Diagnoses    Physical exam    -  Primary   Paresthesia of both feet       Relevant Orders   Vitamin D, 25-hydroxy   Vitamin B12   Screening for endocrine, metabolic and immunity disorder       Relevant Orders   TSH   Comprehensive metabolic panel   CBC with Differential   Hemoglobin A1c   History of elevated glucose       Relevant Orders   Hemoglobin A1c      Meds ordered this encounter  Medications   orphenadrine (NORFLEX) 100 MG tablet    Sig: Take 1 tablet (100 mg total) by mouth 2 (two) times daily as needed for muscle spasms.    Dispense:  180 tablet    Refill:  1    Order Specific Question:   Supervising Provider    Answer:   Carlota Raspberry, JEFFREY R [[id number]]    Follow-up: No follow-ups on file.   PLAN  Baseline lower back pain  Some neck stiffness - being seen by PT  Otherwise exam wnl  Refill norflex  Labs collected - will follow up as warranted  Return in 6 mos  Patient encouraged to call clinic with any questions, comments, or concerns.  Maximiano Coss, NP

## 2019-10-04 NOTE — Telephone Encounter (Signed)
Pt called back after appt and said she forgot to ask you to start prescribing her Crestor 20 mg tablets 1 time daily. Is this acceptable?

## 2019-10-05 LAB — COMPREHENSIVE METABOLIC PANEL
ALT: 17 IU/L (ref 0–32)
AST: 15 IU/L (ref 0–40)
Albumin/Globulin Ratio: 2 (ref 1.2–2.2)
Albumin: 4.2 g/dL (ref 3.8–4.8)
Alkaline Phosphatase: 80 IU/L (ref 48–121)
BUN/Creatinine Ratio: 19 (ref 12–28)
BUN: 12 mg/dL (ref 8–27)
Bilirubin Total: 0.4 mg/dL (ref 0.0–1.2)
CO2: 24 mmol/L (ref 20–29)
Calcium: 9.3 mg/dL (ref 8.7–10.3)
Chloride: 106 mmol/L (ref 96–106)
Creatinine, Ser: 0.62 mg/dL (ref 0.57–1.00)
GFR calc Af Amer: 112 mL/min/{1.73_m2} (ref >59)
GFR calc non Af Amer: 97 mL/min/{1.73_m2} (ref >59)
Globulin, Total: 2.1 g/dL (ref 1.5–4.5)
Glucose: 93 mg/dL (ref 65–99)
Potassium: 4.1 mmol/L (ref 3.5–5.2)
Sodium: 142 mmol/L (ref 134–144)
Total Protein: 6.3 g/dL (ref 6.0–8.5)

## 2019-10-05 LAB — CBC WITH DIFFERENTIAL/PLATELET
Basophils Absolute: 0.1 10*3/uL (ref 0.0–0.2)
Basos: 1 %
EOS (ABSOLUTE): 0.2 10*3/uL (ref 0.0–0.4)
Eos: 2 %
Hematocrit: 41.5 % (ref 34.0–46.6)
Hemoglobin: 13.9 g/dL (ref 11.1–15.9)
Immature Grans (Abs): 0 10*3/uL (ref 0.0–0.1)
Immature Granulocytes: 0 %
Lymphocytes Absolute: 3.7 10*3/uL — ABNORMAL HIGH (ref 0.7–3.1)
Lymphs: 48 %
MCH: 28.3 pg (ref 26.6–33.0)
MCHC: 33.5 g/dL (ref 31.5–35.7)
MCV: 85 fL (ref 79–97)
Monocytes Absolute: 0.4 10*3/uL (ref 0.1–0.9)
Monocytes: 5 %
Neutrophils Absolute: 3.5 10*3/uL (ref 1.4–7.0)
Neutrophils: 44 %
Platelets: 219 10*3/uL (ref 150–450)
RBC: 4.91 x10E6/uL (ref 3.77–5.28)
RDW: 13.6 % (ref 11.7–15.4)
WBC: 7.8 10*3/uL (ref 3.4–10.8)

## 2019-10-05 LAB — LIPID PANEL
Chol/HDL Ratio: 2.9 ratio (ref 0.0–4.4)
Cholesterol, Total: 180 mg/dL (ref 100–199)
HDL: 63 mg/dL (ref >39)
LDL Chol Calc (NIH): 106 mg/dL — ABNORMAL HIGH (ref 0–99)
Triglycerides: 57 mg/dL (ref 0–149)
VLDL Cholesterol Cal: 11 mg/dL (ref 5–40)

## 2019-10-05 LAB — TSH: TSH: 4.46 u[IU]/mL (ref 0.450–4.500)

## 2019-10-05 LAB — VITAMIN B12: Vitamin B-12: 1246 pg/mL — ABNORMAL HIGH (ref 232–1245)

## 2019-10-05 LAB — VITAMIN D 25 HYDROXY (VIT D DEFICIENCY, FRACTURES): Vit D, 25-Hydroxy: 37.7 ng/mL (ref 30.0–100.0)

## 2019-10-05 LAB — HEMOGLOBIN A1C
Est. average glucose Bld gHb Est-mCnc: 126 mg/dL
Hgb A1c MFr Bld: 6 % — ABNORMAL HIGH (ref 4.8–5.6)

## 2019-10-09 ENCOUNTER — Other Ambulatory Visit: Payer: Self-pay | Admitting: Registered Nurse

## 2019-10-09 NOTE — Telephone Encounter (Signed)
Refilled per PCP notes regarding refill request for Crestor 20 mg .

## 2019-10-22 ENCOUNTER — Encounter: Payer: Self-pay | Admitting: Registered Nurse

## 2019-10-22 DIAGNOSIS — R748 Abnormal levels of other serum enzymes: Secondary | ICD-10-CM

## 2019-11-14 ENCOUNTER — Other Ambulatory Visit: Payer: Self-pay | Admitting: Registered Nurse

## 2019-11-14 DIAGNOSIS — J3089 Other allergic rhinitis: Secondary | ICD-10-CM

## 2019-11-14 DIAGNOSIS — J302 Other seasonal allergic rhinitis: Secondary | ICD-10-CM

## 2019-11-14 MED ORDER — AZELASTINE HCL 0.1 % NA SOLN: 1.0000 | Freq: Two times a day (BID) | NASAL | 6 refills | Status: DC

## 2019-11-14 NOTE — Telephone Encounter (Signed)
Medication Refill - Medication: azelastine (ASTELIN) 0.1 % nasal spray [[id number]]      Preferred Pharmacy (with phone number or street name):  [facility], [address]. [address] [place] [address]  Phone: [phone] Fax: [phone]  Hours: Not open 24 hours     Agent: Please be advised that RX refills may take up to 3 business days. We ask that you follow-up with your pharmacy.

## 2019-11-26 ENCOUNTER — Telehealth: Payer: Self-pay | Admitting: Registered Nurse

## 2019-11-26 NOTE — Telephone Encounter (Signed)
Pt brought in a form to be filled out by [employer]. I have placed form in provider's red box in provider's lounge. She would also like  to  know if she should be put on a blood thinner because of her TIA? Please advise when complete and she will pick up. [phone]

## 2019-11-26 NOTE — Telephone Encounter (Signed)
FYI. Let me know if there is anything I need to do

## 2019-11-27 ENCOUNTER — Ambulatory Visit (INDEPENDENT_AMBULATORY_CARE_PROVIDER_SITE_OTHER): Payer: 59 | Admitting: Emergency Medicine

## 2019-11-27 ENCOUNTER — Encounter: Payer: Self-pay | Admitting: Emergency Medicine

## 2019-11-27 ENCOUNTER — Other Ambulatory Visit: Payer: Self-pay

## 2019-11-27 VITALS — BP 136/80 | HR 76 | Temp 98.6°F | Ht 63.0 in | Wt 176.6 lb

## 2019-11-27 DIAGNOSIS — J0100 Acute maxillary sinusitis, unspecified: Secondary | ICD-10-CM

## 2019-11-27 DIAGNOSIS — R519 Headache, unspecified: Secondary | ICD-10-CM | POA: Diagnosis not present

## 2019-11-27 DIAGNOSIS — R0981 Nasal congestion: Secondary | ICD-10-CM | POA: Diagnosis not present

## 2019-11-27 MED ORDER — PSEUDOEPHEDRINE-GUAIFENESIN ER 60-600 MG PO TB12: 1.0000 | ORAL_TABLET | Freq: Two times a day (BID) | ORAL | 1 refills | Status: AC

## 2019-11-27 MED ORDER — DOXYCYCLINE HYCLATE 100 MG PO TABS: 100.0000 mg | ORAL_TABLET | Freq: Two times a day (BID) | ORAL | 0 refills | Status: DC

## 2019-11-27 NOTE — Patient Instructions (Addendum)
If you have lab work done today you will be contacted with your lab results within the next 2 weeks.  If you have not heard from [place] then please contact us. The fastest way to get your results is to register for My Chart.   IF you received an x-ray today, you will receive an invoice from [facility]. Please contact [facility] at [phone] with questions or concerns regarding your invoice.   IF you received labwork today, you will receive an invoice from [place]. Please contact [facility] at [phone] with questions or concerns regarding your invoice.   Our billing staff will not be able to assist you with questions regarding bills from these companies.  You will be contacted with the lab results as soon as they are available. The fastest way to get your results is to activate your My Chart account. Instructions are located on the last page of this paperwork. If you have not heard from [place] regarding the results in 2 weeks, please contact this office.      Sinusitis, Adult Sinusitis is soreness and swelling (inflammation) of your sinuses. Sinuses are hollow spaces in the bones around your face. They are located:  Around your eyes.  In the middle of your forehead.  Behind your nose.  In your cheekbones. Your sinuses and nasal passages are lined with a fluid called mucus. Mucus drains out of your sinuses. Swelling can trap mucus in your sinuses. This lets germs (bacteria, virus, or fungus) grow, which leads to infection. Most of the time, this condition is caused by a virus. What are the causes? This condition is caused by:  Allergies.  Asthma.  Germs.  Things that block your nose or sinuses.  Growths in the nose (nasal polyps).  Chemicals or irritants in the air.  Fungus (rare). What increases the risk? You are more likely to develop this condition if:  You have a weak body defense system (immune system).  You do a lot of swimming or  diving.  You use nasal sprays too much.  You smoke. What are the signs or symptoms? The main symptoms of this condition are pain and a feeling of pressure around the sinuses. Other symptoms include:  Stuffy nose (congestion).  Runny nose (drainage).  Swelling and warmth in the sinuses.  Headache.  Toothache.  A cough that may get worse at night.  Mucus that collects in the throat or the back of the nose (postnasal drip).  Being unable to smell and taste.  Being very tired (fatigue).  A fever.  Sore throat.  Bad breath. How is this diagnosed? This condition is diagnosed based on:  Your symptoms.  Your medical history.  A physical exam.  Tests to find out if your condition is short-term (acute) or long-term (chronic). Your doctor may: ? Check your nose for growths (polyps). ? Check your sinuses using a tool that has a light (endoscope). ? Check for allergies or germs. ? Do imaging tests, such as an MRI or CT scan. How is this treated? Treatment for this condition depends on the cause and whether it is short-term or long-term.  If caused by a virus, your symptoms should go away on their own within 10 days. You may be given medicines to relieve symptoms. They include: ? Medicines that shrink swollen tissue in the nose. ? Medicines that treat allergies (antihistamines). ? A spray that treats swelling of the nostrils. ? Rinses that help get rid of thick mucus in  your nose (nasal saline washes).  If caused by bacteria, your doctor may wait to see if you will get better without treatment. You may be given antibiotic medicine if you have: ? A very bad infection. ? A weak body defense system.  If caused by growths in the nose, you may need to have surgery. Follow these instructions at home: Medicines  Take, use, or apply over-the-counter and prescription medicines only as told by your doctor. These may include nasal sprays.  If you were prescribed an antibiotic  medicine, take it as told by your doctor. Do not stop taking the antibiotic even if you start to feel better. Hydrate and humidify   Drink enough water to keep your pee (urine) pale yellow.  Use a cool mist humidifier to keep the humidity level in your home above 50%.  Breathe in steam for 10-15 minutes, 3-4 times a day, or as told by your doctor. You can do this in the bathroom while a hot shower is running.  Try not to spend time in cool or dry air. Rest  Rest as much as you can.  Sleep with your head raised (elevated).  Make sure you get enough sleep each night. General instructions   Put a warm, moist washcloth on your face 3-4 times a day, or as often as told by your doctor. This will help with discomfort.  Wash your hands often with soap and water. If there is no soap and water, use hand sanitizer.  Do not smoke. Avoid being around people who are smoking (secondhand smoke).  Keep all follow-up visits as told by your doctor. This is important. Contact a doctor if:  You have a fever.  Your symptoms get worse.  Your symptoms do not get better within 10 days. Get help right away if:  You have a very bad headache.  You cannot stop throwing up (vomiting).  You have very bad pain or swelling around your face or eyes.  You have trouble seeing.  You feel confused.  Your neck is stiff.  You have trouble breathing. Summary  Sinusitis is swelling of your sinuses. Sinuses are hollow spaces in the bones around your face.  This condition is caused by tissues in your nose that become inflamed or swollen. This traps germs. These can lead to infection.  If you were prescribed an antibiotic medicine, take it as told by your doctor. Do not stop taking it even if you start to feel better.  Keep all follow-up visits as told by your doctor. This is important. This information is not intended to replace advice given to you by your health care provider. Make sure you discuss  any questions you have with your health care provider. Document Revised: 07/17/2017 Document Reviewed: 07/17/2017 [employer] Patient Education  [place].

## 2019-11-27 NOTE — Progress Notes (Signed)
Jasmine [name] 64 y.o.    Chief Complaint  Patient presents with   possible sinus infection    going on almost 5 days    Headache   Nasal Congestion    HISTORY OF PRESENT ILLNESS: This is a 64 y.o. female complaining of possible sinus infection that started about 5 days ago.  Has history of recurrent sinus infections.  Mostly complaining of sinus congestion and sinus headaches.  No other significant symptoms.  HPI   Prior to Admission medications   Medication Sig Start Date End Date Taking? Authorizing Provider  acetaminophen (TYLENOL) 500 MG tablet Take 500 mg by mouth every 8 (eight) hours as needed for moderate pain.    [provider]  albuterol (VENTOLIN HFA) 108 (90 Base) MCG/ACT inhaler Inhale 2 puffs into the lungs every 6 (six) hours as needed for wheezing or shortness of breath. 09/24/19   Maximiano Coss, NP  aspirin 81 MG EC tablet Take 81 mg by mouth daily.    [provider]  azelastine (ASTELIN) 0.1 % nasal spray Place 1 spray into both nostrils 2 (two) times daily. Use in each nostril as directed 11/14/19   Maximiano Coss, NP  B COMPLEX VITAMINS PO Take by mouth.    [provider]  clonazePAM (KLONOPIN) 1 MG tablet SMARTSIG:3 Tablet(s) By Mouth Every Other Day PRN 04/08/19   [provider]  cloNIDine (CATAPRES) 0.1 MG tablet Take 1 tablet (0.1 mg total) by mouth as needed. Use as needed for blood pressure greater than 160/100 02/13/19   Daune Perch, NP  Erenumab-aooe (AIMOVIG) 140 MG/ML SOAJ Inject 140 mg into the skin every 30 (thirty) days. 05/31/19   Kathrynn Ducking, MD  felodipine (PLENDIL) 10 MG 24 hr tablet Take 1 tablet (10 mg total) by mouth daily. 02/13/19   Daune Perch, NP  fluticasone [facility]) 50 MCG/ACT nasal spray USE TWO SPRAY(S) IN EACH NOSTRIL ONCE DAILY 09/24/19   Maximiano Coss, NP  Fluticasone-Salmeterol (ADVAIR) 100-50 MCG/DOSE AEPB     [provider]  gabapentin (NEURONTIN) 800 MG tablet Take  1 tablet (800 mg total) by mouth 3 (three) times daily. 06/13/19   Kathrynn Ducking, MD  LACTOBACILLUS RHAMNOSUS, GG, PO Take by mouth.    [provider]  levalbuterol Penne Lash HFA) 45 MCG/ACT inhaler Inhale into the lungs.    [provider]  losartan (COZAAR) 100 MG tablet Take 1 tablet (100 mg total) by mouth daily. 08/30/19   Maximiano Coss, NP  metoprolol succinate (TOPROL-XL) 25 MG 24 hr tablet Take 1 tablet (25 mg total) by mouth daily. Take 25 mg by mouth daily. 07/15/19   Maximiano Coss, NP  Multiple Vitamin (MULTIVITAMIN PO) Take by mouth.    [provider]  orphenadrine (NORFLEX) 100 MG tablet Take 1 tablet (100 mg total) by mouth 2 (two) times daily as needed for muscle spasms. 10/04/19   Maximiano Coss, NP  polyethylene glycol powder (GLYCOLAX/MIRALAX) powder Take 17 g by mouth 2 (two) times daily as needed. Patient taking differently: Take 17 g by mouth 2 (two) times daily as needed for moderate constipation.  11/18/16   Brunetta Jeans, PA-C  rosuvastatin (CRESTOR) 20 MG tablet TAKE ONE TABLET BY MOUTH EVERY EVENING 10/09/19   Maximiano Coss, NP  Salicylic Acid-Urea 0-24 % OINT Apply 1 application topically daily. 07/01/19   Maximiano Coss, NP  temazepam (RESTORIL) 15 MG capsule     [provider]  zolpidem (AMBIEN) 10 MG tablet Take 2  tablets by mouth at bedtime.  04/06/16   [provider]    Allergies  Allergen Reactions   Amlodipine Shortness Of Breath    Pt states she also had very bad pain    Diclofenac Nausea And Vomiting and Nausea Only   Sulfa Antibiotics Hives and Rash   Famciclovir Other (See Comments)   Metronidazole Nausea And Vomiting   Soma [Carisoprodol] Other (See Comments)    Moody and sleepy Cognitive Function, Daytime sleepiness   Acyclovir    Acyclovir And Related    Benadryl [Diphenhydramine Hcl]     Severe emotional reaction and doesn't work well with Pt. Past history   Cephalosporins Hives    Codeine Other (See Comments)    Makes patient feel odd ; "sometimes mild; sometimes severe reaction; mostly severe"   Cyclobenzaprine Other (See Comments)    Muscle Aches.   Diclofenac Sodium Nausea And Vomiting   Dicyclomine    Hydrocodone    Hydrocodone-Acetaminophen    Linzess [Linaclotide] Diarrhea   Nitrofurantoin Monohyd Macro     Felt funny   Suboxone [Buprenorphine Hcl-Naloxone Hcl] Nausea And Vomiting   Tizanidine     Other reaction(s): Other (See Comments) insomnia   Tramadol    Baclofen Nausea Only   Drug Ingredient [Ganciclovir] Rash   Meloxicam     Other reaction(s): Confusion   Sulfamethoxazole Rash    Patient Active Problem List   Diagnosis Date Noted   Nocturnal leg cramps 05/31/2019   Anorgasmia of female 05/22/2018   Abnormal weight loss 05/22/2018   Degeneration of spine 05/22/2018   Depressive disorder 05/22/2018   Diverticulitis 05/22/2018   Achilles tendinosis of left lower extremity 01/23/2017   Cervicogenic headache 08/24/2016   Pain of toe of left foot 05/19/2016   Left shoulder pain 05/11/2016   Dry eye syndrome of both lacrimal glands 04/27/2016   Hyperlipidemia 02/01/2016   Benign paroxysmal positional vertigo 01/05/2016   Fibromyalgia 10/31/2015   Migraine 08/07/2015   Neck pain 08/07/2015   Myalgia and myositis 03/04/2015   Bilateral sensorineural hearing loss 12/22/2014   DJD (degenerative joint disease) of knee 12/09/2014   Essential hypertension 12/08/2014   Bradycardia 09/22/2014   Hypercalcemia 09/22/2014   Osteoporosis 12/23/2013   DJD (degenerative joint disease) 08/19/2013   Renal cyst 05/12/2013   Obesity 09/19/2012   Chronic low back pain 11/30/2011   Schizophrenia ([place]) 07/14/2011   Insomnia 09/22/2010   Perennial allergic rhinitis with seasonal variation 09/22/2010   Mild intermittent asthma without complication 45/80/9983   Hypercholesteremia 09/22/2010    Past Medical  History:  Diagnosis Date   Achilles tendinosis of left lower extremity 01/23/2017   Anxiety    Asthma    Benign paroxysmal positional vertigo 01/05/2016   Bilateral sensorineural hearing loss 12/22/2014   Blepharitis of upper and lower eyelids of both eyes 04/27/2016   Bradycardia 09/22/2014   Cervicogenic headache 08/24/2016   Chronic high back pain    CKD (chronic kidney disease) 12/08/2014   Cochlear implant in place    Colon polyp    Constipation    Diverticulitis    Diverticulosis    DJD (degenerative joint disease)    Double vision    Dry eye syndrome of both lacrimal glands 04/27/2016   Fibromyalgia    Heart murmur    High cholesterol    Hypercalcemia    Hypercholesterolemia    Hyperlipidemia 02/01/2016   Hypertension    Hypoglycemia    Insomnia 09/22/2010   Kidney stone  Meniscus tear    Right   Migraine 08/07/2015   Mild intermittent asthma without complication 27/78/2423   Nephrolithiasis 03/17/2014   Nocturnal leg cramps 05/31/2019   Osteoporosis    Primary hyperparathyroidism ([place]) 12/10/2014   PTSD (post-traumatic stress disorder)    Renal cyst 05/12/2013   Schizophrenia ([place])    treated by Dr. Casimiro Needle   Schizophrenia [facility])    Seizures ([place])    last seizure 3 years ago, not on AED   Stroke ([place]) 07/08/11   "I've had mini strokes before; left side of face  is more down than right "   Syncope and collapse    One episode - 07/10/15   TIA (transient ischemic attack)     Past Surgical History:  Procedure Laterality Date   CATARACT EXTRACTION     COCHLEAR IMPLANT  2010   left   DILATION AND CURETTAGE OF UTERUS     HERNIA REPAIR     INGUINAL HERNIA REPAIR  1985   left   KIDNEY STONE SURGERY  ~ 2006   PARATHYROIDECTOMY     skin cancer removal     TONSILLECTOMY     "as a child"   tooth removal     WISDOM TOOTH EXTRACTION      Social History   Socioeconomic History   Marital status: Married     Spouse name: [name]   Number of children: 3   Years of education: 2 yrs coll   Highest education level: Not on file  Occupational History   Occupation: Disabled  Tobacco Use   Smoking status: Former Smoker    Packs/day: 1.00    Years: 20.00    Pack years: 20.00    Types: Cigarettes    Quit date: 02/29/2004    Years since quitting: 15.7   Smokeless tobacco: Never Used  Vaping Use   Vaping Use: Never used  Substance and Sexual Activity   Alcohol use: Yes    Alcohol/week: 0.0 standard drinks    Comment: "glass of wine q once in awhile; not very often; do it on special occasion"   Drug use: No   Sexual activity: Never  Other Topics Concern   Not on file  Social History Narrative   Completed 2 years of college.    On disability.   Lives at home with her husband.   No caffeine use.   Left-handed.   Three children, 2 biological - 1 adopted.   Social Determinants of Health   Financial Resource Strain:    Difficulty of Paying Living Expenses: Not on file  Food Insecurity:    Worried About Charity fundraiser in the Last Year: Not on file   [employer] of Food in the Last Year: Not on file  Transportation Needs:    Lack of Transportation (Medical): Not on file   Lack of Transportation (Non-Medical): Not on file  Physical Activity:    Days of Exercise per Week: Not on file   Minutes of Exercise per Session: Not on file  Stress:    Feeling of Stress : Not on file  Social Connections:    Frequency of Communication with Friends and Family: Not on file   Frequency of Social Gatherings with Friends and Family: Not on file   Attends Religious Services: Not on file   Active Member of Clubs or Organizations: Not on file   Attends Archivist Meetings: Not on file   Marital Status: Not on file  Intimate Partner  Violence:    Fear of Current or Ex-Partner: Not on file   Emotionally Abused: Not on file   Physically Abused: Not on file   Sexually  Abused: Not on file    Family History  Problem Relation Age of Onset   Heart attack Father 14       Living   Arthritis Father    Skin cancer Father    Hypertension Father    Hyperlipidemia Father    Leukemia Mother 86       Deceased   Alzheimer's disease Paternal Aunt        X2   Stomach cancer Paternal Aunt        x1   Arthritis/Rheumatoid Paternal Aunt        x1   Neuropathy Brother        Peripheal   Fibromyalgia Sister    HIV Sister    Fibromyalgia Sister    Arthritis/Rheumatoid Sister    Diabetes Paternal Grandmother    Thyroid disease Sister      Review of Systems  Constitutional: Negative.  Negative for chills and fever.  HENT: Positive for congestion and sinus pain. Negative for sore throat.   Eyes: Negative.   Respiratory: Negative.  Negative for cough and shortness of breath.   Cardiovascular: Negative.  Negative for chest pain and palpitations.  Gastrointestinal: Negative for abdominal pain, diarrhea, nausea and vomiting.  Genitourinary: Negative.   Musculoskeletal: Negative.  Negative for myalgias and neck pain.  Skin: Negative.  Negative for rash.  Neurological: Positive for headaches.  All other systems reviewed and are negative.  Vitals:   11/27/19 1443  BP: 136/80  Pulse: 76  Temp: 98.6 F (37 C)  SpO2: 95%     Physical Exam Vitals reviewed.  Constitutional:      Appearance: She is well-developed.  HENT:     Head: Normocephalic.     Right Ear: Tympanic membrane, ear canal and external ear normal.     Left Ear: Tympanic membrane, ear canal and external ear normal.     Nose: Congestion present.     Right Sinus: Maxillary sinus tenderness and frontal sinus tenderness present.     Left Sinus: Frontal sinus tenderness present. No maxillary sinus tenderness.     Mouth/Throat:     Mouth: Mucous membranes are moist.     Pharynx: Oropharynx is clear. No oropharyngeal exudate or posterior oropharyngeal erythema.  Eyes:      Extraocular Movements: Extraocular movements intact.     Conjunctiva/sclera: Conjunctivae normal.     Pupils: Pupils are equal, round, and reactive to light.  Cardiovascular:     Rate and Rhythm: Normal rate and regular rhythm.     Heart sounds: Normal heart sounds.  Pulmonary:     Effort: Pulmonary effort is normal.     Breath sounds: Normal breath sounds.  Musculoskeletal:        General: Normal range of motion.     Cervical back: Normal range of motion and neck supple. No tenderness.  Lymphadenopathy:     Cervical: No cervical adenopathy.  Skin:    General: Skin is warm and dry.     Capillary Refill: Capillary refill takes less than 2 seconds.  Neurological:     General: No focal deficit present.     Mental Status: She is alert and oriented to person, place, and time.  Psychiatric:        Mood and Affect: Mood normal.  Behavior: Behavior normal.      ASSESSMENT & PLAN: Crystallee was seen today for possible sinus infection, headache and nasal congestion.  Diagnoses and all orders for this visit:  Acute non-recurrent maxillary sinusitis -     doxycycline (VIBRA-TABS) 100 MG tablet; Take 1 tablet (100 mg total) by mouth 2 (two) times daily.  Sinus congestion -     pseudoephedrine-guaifenesin (MUCINEX D) 60-600 MG 12 hr tablet; Take 1 tablet by mouth every 12 (twelve) hours for 5 days.  Sinus headache    Patient Instructions       If you have lab work done today you will be contacted with your lab results within the next 2 weeks.  If you have not heard from [place] then please contact us. The fastest way to get your results is to register for My Chart.   IF you received an x-ray today, you will receive an invoice from [facility]. Please contact [facility] at [phone] with questions or concerns regarding your invoice.   IF you received labwork today, you will receive an invoice from [place]. Please contact [facility] at [phone] with  questions or concerns regarding your invoice.   Our billing staff will not be able to assist you with questions regarding bills from these companies.  You will be contacted with the lab results as soon as they are available. The fastest way to get your results is to activate your My Chart account. Instructions are located on the last page of this paperwork. If you have not heard from [place] regarding the results in 2 weeks, please contact this office.      Sinusitis, Adult Sinusitis is soreness and swelling (inflammation) of your sinuses. Sinuses are hollow spaces in the bones around your face. They are located:  Around your eyes.  In the middle of your forehead.  Behind your nose.  In your cheekbones. Your sinuses and nasal passages are lined with a fluid called mucus. Mucus drains out of your sinuses. Swelling can trap mucus in your sinuses. This lets germs (bacteria, virus, or fungus) grow, which leads to infection. Most of the time, this condition is caused by a virus. What are the causes? This condition is caused by:  Allergies.  Asthma.  Germs.  Things that block your nose or sinuses.  Growths in the nose (nasal polyps).  Chemicals or irritants in the air.  Fungus (rare). What increases the risk? You are more likely to develop this condition if:  You have a weak body defense system (immune system).  You do a lot of swimming or diving.  You use nasal sprays too much.  You smoke. What are the signs or symptoms? The main symptoms of this condition are pain and a feeling of pressure around the sinuses. Other symptoms include:  Stuffy nose (congestion).  Runny nose (drainage).  Swelling and warmth in the sinuses.  Headache.  Toothache.  A cough that may get worse at night.  Mucus that collects in the throat or the back of the nose (postnasal drip).  Being unable to smell and taste.  Being very tired (fatigue).  A fever.  Sore throat.  Bad  breath. How is this diagnosed? This condition is diagnosed based on:  Your symptoms.  Your medical history.  A physical exam.  Tests to find out if your condition is short-term (acute) or long-term (chronic). Your doctor may: ? Check your nose for growths (polyps). ? Check your sinuses using a tool that has a light (  endoscope). ? Check for allergies or germs. ? Do imaging tests, such as an MRI or CT scan. How is this treated? Treatment for this condition depends on the cause and whether it is short-term or long-term.  If caused by a virus, your symptoms should go away on their own within 10 days. You may be given medicines to relieve symptoms. They include: ? Medicines that shrink swollen tissue in the nose. ? Medicines that treat allergies (antihistamines). ? A spray that treats swelling of the nostrils. ? Rinses that help get rid of thick mucus in your nose (nasal saline washes).  If caused by bacteria, your doctor may wait to see if you will get better without treatment. You may be given antibiotic medicine if you have: ? A very bad infection. ? A weak body defense system.  If caused by growths in the nose, you may need to have surgery. Follow these instructions at home: Medicines  Take, use, or apply over-the-counter and prescription medicines only as told by your doctor. These may include nasal sprays.  If you were prescribed an antibiotic medicine, take it as told by your doctor. Do not stop taking the antibiotic even if you start to feel better. Hydrate and humidify   Drink enough water to keep your pee (urine) pale yellow.  Use a cool mist humidifier to keep the humidity level in your home above 50%.  Breathe in steam for 10-15 minutes, 3-4 times a day, or as told by your doctor. You can do this in the bathroom while a hot shower is running.  Try not to spend time in cool or dry air. Rest  Rest as much as you can.  Sleep with your head raised  (elevated).  Make sure you get enough sleep each night. General instructions   Put a warm, moist washcloth on your face 3-4 times a day, or as often as told by your doctor. This will help with discomfort.  Wash your hands often with soap and water. If there is no soap and water, use hand sanitizer.  Do not smoke. Avoid being around people who are smoking (secondhand smoke).  Keep all follow-up visits as told by your doctor. This is important. Contact a doctor if:  You have a fever.  Your symptoms get worse.  Your symptoms do not get better within 10 days. Get help right away if:  You have a very bad headache.  You cannot stop throwing up (vomiting).  You have very bad pain or swelling around your face or eyes.  You have trouble seeing.  You feel confused.  Your neck is stiff.  You have trouble breathing. Summary  Sinusitis is swelling of your sinuses. Sinuses are hollow spaces in the bones around your face.  This condition is caused by tissues in your nose that become inflamed or swollen. This traps germs. These can lead to infection.  If you were prescribed an antibiotic medicine, take it as told by your doctor. Do not stop taking it even if you start to feel better.  Keep all follow-up visits as told by your doctor. This is important. This information is not intended to replace advice given to you by your health care provider. Make sure you discuss any questions you have with your health care provider. Document Revised: 07/17/2017 Document Reviewed: 07/17/2017 Elsevier Patient Education  2020 Elsevier Inc.      Agustina Caroli, MD [facility]

## 2019-11-29 ENCOUNTER — Telehealth: Payer: Self-pay

## 2019-11-29 ENCOUNTER — Other Ambulatory Visit: Payer: Self-pay | Admitting: Emergency Medicine

## 2019-11-29 DIAGNOSIS — I10 Essential (primary) hypertension: Secondary | ICD-10-CM

## 2019-11-29 MED ORDER — LOSARTAN POTASSIUM 100 MG PO TABS: 100.0000 mg | ORAL_TABLET | Freq: Every day | ORAL | 2 refills | Status: DC

## 2019-11-29 NOTE — Telephone Encounter (Signed)
Pt. Called asserting the doxacycline was not working. PT has been taking old amoxyciline script and has helped. Pt. Reports mucinex did not work and was perscribed something she spelled as brmophen-ese-dm by her old provider. Pt. Is also requesting refills on losartan and omeprazol-ER.

## 2019-11-29 NOTE — Telephone Encounter (Signed)
Jasmine [name] would like a rx for amoxicillin and Brmophen-ese-dm. Omeprazole has not been filled since 2018. I did refill the Losartan

## 2019-11-29 NOTE — Telephone Encounter (Signed)
Pt. Called checking in on status of papers

## 2019-11-29 NOTE — Telephone Encounter (Signed)
See below msg

## 2019-12-03 ENCOUNTER — Other Ambulatory Visit: Payer: Self-pay | Admitting: Neurology

## 2019-12-04 ENCOUNTER — Other Ambulatory Visit: Payer: Self-pay | Admitting: Registered Nurse

## 2019-12-04 NOTE — Telephone Encounter (Signed)
Form has been filled out and placed in fax pile  Thank you  Kathrin Ruddy, NP

## 2019-12-04 NOTE — Telephone Encounter (Signed)
Regarding amoxicillin - patient is showing allergy to cephalosporins. May have cross sensitivity with penicillins. I am hesitant to send this in Depending on how patient is doing at this time we can reassess Thank you  Kathrin Ruddy, NP

## 2019-12-05 NOTE — Telephone Encounter (Signed)
Called pt. And informed them of the paper's current status

## 2019-12-05 NOTE — Telephone Encounter (Signed)
Left a msg on patient machine that Jasmine [name] is hesitant on writing the  amox rx due to you showing allergy to cephalosporins. May have cross sensitivity with penicillins.  Depending on how patient is doing at this time we can reassess

## 2019-12-09 ENCOUNTER — Telehealth: Payer: Self-pay | Admitting: Emergency Medicine

## 2019-12-09 ENCOUNTER — Ambulatory Visit: Payer: 59 | Admitting: Neurology

## 2019-12-09 NOTE — Telephone Encounter (Signed)
Aimovig PA started on CMM.  Key [id number].   Awaiting determination from [facility]

## 2019-12-09 NOTE — Telephone Encounter (Signed)
Request Reference Number: [id number]. AIMOVIG INJ 140MG /ML is approved through 12/08/2020.

## 2019-12-12 ENCOUNTER — Telehealth: Payer: Self-pay | Admitting: Registered Nurse

## 2019-12-12 NOTE — Telephone Encounter (Signed)
Patient is calling to have her release for surgery paper work that she dropped off a month ago faxed / Pt states she was called to say she could pick up / checked box at front desk . I was unable to find paperwork to fax for patient No paperwork for this patient in box at front desk     Any questions please call patient at [phone]

## 2019-12-12 NOTE — Telephone Encounter (Signed)
Patient is calling to report that she is not allergic to Amox RX   And would like someone in clinical to call her

## 2019-12-12 NOTE — Telephone Encounter (Signed)
Patient returned call today to inform her provider that she is not allergic to amoxicillin, she stated she has taken some that she had from a previous Rx with no problems.

## 2019-12-18 ENCOUNTER — Telehealth: Payer: Self-pay | Admitting: Registered Nurse

## 2019-12-18 NOTE — Telephone Encounter (Signed)
Pt is calling stating she dropped off paper work for surgery and she is stating the doctors office did not get it and she would like it sent to her fax. Listed below. Please advise.  Fax: [phone]

## 2019-12-25 NOTE — Telephone Encounter (Signed)
Do have this paperwork and was it ever faxed?

## 2019-12-30 ENCOUNTER — Other Ambulatory Visit: Payer: Self-pay | Admitting: Registered Nurse

## 2019-12-30 NOTE — Telephone Encounter (Signed)
Medication Refill - Medication: felodipine (PLENDIL) 10 MG 24 hr tablet     Preferred Pharmacy (with phone number or street name):  [facility] - [place], [address] Phone:  [phone]  Fax:  [phone]       Agent: Please be advised that RX refills may take up to 3 business days. We ask that you follow-up with your pharmacy.

## 2019-12-30 NOTE — Telephone Encounter (Signed)
Rich said that he remembers filling out this paper work a few weeks ago. This paperwork had to have been added to the fax pile in the provider lounge.

## 2019-12-31 ENCOUNTER — Other Ambulatory Visit: Payer: Self-pay | Admitting: Registered Nurse

## 2019-12-31 NOTE — Telephone Encounter (Signed)
Pt is calling stating she is needing a med refill on   felodipine (PLENDIL) 10 MG 24 hr tablet [[id number]]  Please advise.

## 2020-01-01 NOTE — Telephone Encounter (Signed)
Pt is on this medication but we have not prescribed it for pt. Please advise on the med refill. Med pended for provider approval. Pt has appointment on Friday.

## 2020-01-01 NOTE — Telephone Encounter (Signed)
noted 

## 2020-01-03 ENCOUNTER — Ambulatory Visit (INDEPENDENT_AMBULATORY_CARE_PROVIDER_SITE_OTHER): Payer: 59 | Admitting: Registered Nurse

## 2020-01-03 ENCOUNTER — Other Ambulatory Visit: Payer: Self-pay

## 2020-01-03 ENCOUNTER — Encounter: Payer: Self-pay | Admitting: Registered Nurse

## 2020-01-03 VITALS — BP 150/81 | HR 91 | Temp 98.6°F | Resp 18 | Ht 63.0 in | Wt 178.8 lb

## 2020-01-03 DIAGNOSIS — K219 Gastro-esophageal reflux disease without esophagitis: Secondary | ICD-10-CM

## 2020-01-03 DIAGNOSIS — S91301A Unspecified open wound, right foot, initial encounter: Secondary | ICD-10-CM

## 2020-01-03 DIAGNOSIS — I10 Essential (primary) hypertension: Secondary | ICD-10-CM | POA: Diagnosis not present

## 2020-01-03 MED ORDER — OMEPRAZOLE 40 MG PO CPDR: 40.0000 mg | DELAYED_RELEASE_CAPSULE | Freq: Every day | ORAL | 3 refills | Status: DC

## 2020-01-03 MED ORDER — FELODIPINE ER 10 MG PO TB24: 10.0000 mg | ORAL_TABLET | Freq: Every day | ORAL | 3 refills | Status: AC

## 2020-01-03 MED ORDER — MUPIROCIN 2 % EX OINT: 1.0000 "application " | TOPICAL_OINTMENT | Freq: Two times a day (BID) | CUTANEOUS | 0 refills | Status: DC

## 2020-01-03 MED ORDER — LIDOCAINE 5 % EX OINT: 1.0000 "application " | TOPICAL_OINTMENT | CUTANEOUS | 0 refills | Status: DC | PRN

## 2020-01-03 NOTE — Patient Instructions (Signed)
° ° ° °  If you have lab work done today you will be contacted with your lab results within the next 2 weeks.  If you have not heard from us then please contact us. The fastest way to get your results is to register for My Chart. ° ° °IF you received an x-ray today, you will receive an invoice from [facility]. Please contact [facility] at [phone] with questions or concerns regarding your invoice.  ° °IF you received labwork today, you will receive an invoice from [facility]. Please contact [facility] at [phone] with questions or concerns regarding your invoice.  ° °Our billing staff will not be able to assist you with questions regarding bills from these companies. ° °You will be contacted with the lab results as soon as they are available. The fastest way to get your results is to activate your My Chart account. Instructions are located on the last page of this paperwork. If you have not heard from us regarding the results in 2 weeks, please contact this office. °  ° ° ° °

## 2020-01-03 NOTE — Progress Notes (Signed)
Acute Office Visit  Subjective:    Patient ID: Jasmine [name], female    DOB: 1956/02/07, 64 y.o.   MRN: [id number]  Chief Complaint  Patient presents with  . Foot Pain    Patient states she has been having some foot and toe problems for an week and she do not think that it is healing properly. patient has tried to put some medication but it not really helping.  . Medication Refill    on pended medications    HPI Patient is in today for toe pain and medication refills.  Toe pain: laceration on underside of second toe on R foot. Starting to heal but now having pain and sensitivity on third and fourth toes. No lesions as yet but some redness. Tender to walk on. Has not happened before. Has had more dry skin than usual lately. Has been using some antifungal spray which has provided some relief, but incomplete.   Med Refill: Omeprazole and Felodipine. Has been on both for some time. Both last prescribed by past providers. Tolerates both well. I asked her specifically regarding her listed allergy to amlodipine - she has not had any reaction to felodipine whatsoever and is keen on continuing this medication at this dose. No worsening GERD or emesis, no melena.   Otherwise feeling well and without complaint   Past Medical History:  Diagnosis Date  . Achilles tendinosis of left lower extremity 01/23/2017  . Anxiety   . Asthma   . Benign paroxysmal positional vertigo 01/05/2016  . Bilateral sensorineural hearing loss 12/22/2014  . Blepharitis of upper and lower eyelids of both eyes 04/27/2016  . Bradycardia 09/22/2014  . Cervicogenic headache 08/24/2016  . Chronic high back pain   . CKD (chronic kidney disease) 12/08/2014  . Cochlear implant in place   . Colon polyp   . Constipation   . Diverticulitis   . Diverticulosis   . DJD (degenerative joint disease)   . Double vision   . Dry eye syndrome of both lacrimal glands 04/27/2016  . Fibromyalgia   . Heart murmur   . High  cholesterol   . Hypercalcemia   . Hypercholesterolemia   . Hyperlipidemia 02/01/2016  . Hypertension   . Hypoglycemia   . Insomnia 09/22/2010  . Kidney stone   . Meniscus tear    Right  . Migraine 08/07/2015  . Mild intermittent asthma without complication 67/01/4579  . Nephrolithiasis 03/17/2014  . Nocturnal leg cramps 05/31/2019  . Osteoporosis   . Primary hyperparathyroidism ([place]) 12/10/2014  . PTSD (post-traumatic stress disorder)   . Renal cyst 05/12/2013  . Schizophrenia ([place])    treated by Dr. Casimiro Needle  . Schizophrenia ([place])   . Seizures ([place])    last seizure 3 years ago, not on AED  . Stroke ([place]) 07/08/11   "I've had mini strokes before; left side of face  is more down than right "  . Syncope and collapse    One episode - 07/10/15  . TIA (transient ischemic attack)     Past Surgical History:  Procedure Laterality Date  . CATARACT EXTRACTION    . COCHLEAR IMPLANT  2010   left  . DILATION AND CURETTAGE OF UTERUS    . HERNIA REPAIR    . [place]   left  . KIDNEY STONE SURGERY  ~ 2006  . PARATHYROIDECTOMY    . skin cancer removal    . TONSILLECTOMY     "as a child"  .  tooth removal    . WISDOM TOOTH EXTRACTION      Family History  Problem Relation Age of Onset  . Heart attack Father 28       Living  . Arthritis Father   . Skin cancer Father   . Hypertension Father   . Hyperlipidemia Father   . Leukemia Mother 63       Deceased  . Alzheimer's disease Paternal Aunt        X2  . Stomach cancer Paternal Aunt        x1  . Arthritis/Rheumatoid Paternal Aunt        x1  . Neuropathy Brother        Peripheal  . Fibromyalgia Sister   . HIV Sister   . Fibromyalgia Sister   . Arthritis/Rheumatoid Sister   . Diabetes Paternal Grandmother   . Thyroid disease Sister     Social History   Socioeconomic History  . Marital status: Married    Spouse name: [name]  . Number of children: 3  . Years of education: 2 yrs coll  . Highest education  level: Not on file  Occupational History  . Occupation: Disabled  Tobacco Use  . Smoking status: Former Smoker    Packs/day: 1.00    Years: 20.00    Pack years: 20.00    Types: Cigarettes    Quit date: 02/29/2004    Years since quitting: 15.8  . Smokeless tobacco: Never Used  Vaping Use  . Vaping Use: Never used  Substance and Sexual Activity  . Alcohol use: Yes    Alcohol/week: 0.0 standard drinks    Comment: "glass of wine q once in awhile; not very often; do it on special occasion"  . Drug use: No  . Sexual activity: Never  Other Topics Concern  . Not on file  Social History Narrative   Completed 2 years of college.    On disability.   Lives at home with her husband.   No caffeine use.   Left-handed.   Three children, 2 biological - 1 adopted.   Social Determinants of Health   Financial Resource Strain:   . Difficulty of Paying Living Expenses: Not on file  Food Insecurity:   . Worried About Charity fundraiser in the Last Year: Not on file  . Ran Out of Food in the Last Year: Not on file  Transportation Needs:   . Lack of Transportation (Medical): Not on file  . Lack of Transportation (Non-Medical): Not on file  Physical Activity:   . Days of Exercise per Week: Not on file  . Minutes of Exercise per Session: Not on file  Stress:   . Feeling of Stress : Not on file  Social Connections:   . Frequency of Communication with Friends and Family: Not on file  . Frequency of Social Gatherings with Friends and Family: Not on file  . Attends Religious Services: Not on file  . Active Member of Clubs or Organizations: Not on file  . Attends Archivist Meetings: Not on file  . Marital Status: Not on file  Intimate Partner Violence:   . Fear of Current or Ex-Partner: Not on file  . Emotionally Abused: Not on file  . Physically Abused: Not on file  . Sexually Abused: Not on file    Outpatient Medications Prior to Visit  Medication Sig Dispense Refill  .  acetaminophen (TYLENOL) 500 MG tablet Take 500 mg by mouth every 8 (eight) hours  as needed for moderate pain.    Marland Kitchen AIMOVIG 140 MG/ML SOAJ INJECT 140MG  UNDER THE SKIN EVERY 30 DAYS 1 mL 5  . albuterol (VENTOLIN HFA) 108 (90 Base) MCG/ACT inhaler Inhale 2 puffs into the lungs every 6 (six) hours as needed for wheezing or shortness of breath. 6.7 g 2  . aspirin 81 MG EC tablet Take 81 mg by mouth daily.    Marland Kitchen azelastine (ASTELIN) 0.1 % nasal spray Place 1 spray into both nostrils 2 (two) times daily. Use in each nostril as directed 30 mL 6  . B COMPLEX VITAMINS PO Take by mouth.    . clonazePAM (KLONOPIN) 1 MG tablet SMARTSIG:3 Tablet(s) By Mouth Every Other Day PRN    . cloNIDine (CATAPRES) 0.1 MG tablet Take 1 tablet (0.1 mg total) by mouth as needed. Use as needed for blood pressure greater than 160/100 30 tablet 3  . fluticasone (FLONASE) 50 MCG/ACT nasal spray USE TWO SPRAY(S) IN EACH NOSTRIL ONCE DAILY 16 g 5  . Fluticasone-Salmeterol (ADVAIR) 100-50 MCG/DOSE AEPB     . gabapentin (NEURONTIN) 800 MG tablet Take 1 tablet (800 mg total) by mouth 3 (three) times daily. 270 tablet 1  . LACTOBACILLUS RHAMNOSUS, GG, PO Take by mouth.     . levalbuterol (XOPENEX HFA) 45 MCG/ACT inhaler Inhale into the lungs.    Marland Kitchen losartan (COZAAR) 100 MG tablet Take 1 tablet (100 mg total) by mouth daily. 30 tablet 2  . metoprolol succinate (TOPROL-XL) 25 MG 24 hr tablet Take 1 tablet (25 mg total) by mouth daily. Take 25 mg by mouth daily. 90 tablet 3  . Multiple Vitamin (MULTIVITAMIN PO) Take by mouth.    . orphenadrine (NORFLEX) 100 MG tablet Take 1 tablet (100 mg total) by mouth 2 (two) times daily as needed for muscle spasms. 180 tablet 1  . polyethylene glycol powder (GLYCOLAX/MIRALAX) powder Take 17 g by mouth 2 (two) times daily as needed. (Patient taking differently: Take 17 g by mouth 2 (two) times daily as needed for moderate constipation. ) 3350 g 1  . rosuvastatin (CRESTOR) 20 MG tablet TAKE ONE TABLET BY  MOUTH EVERY EVENING 90 tablet 0  . Salicylic Acid-Urea 2-33 % OINT Apply 1 application topically daily. 30 g 0  . temazepam (RESTORIL) 15 MG capsule     . zolpidem (AMBIEN) 10 MG tablet Take 2 tablets by mouth at bedtime.     . felodipine (PLENDIL) 10 MG 24 hr tablet Take 1 tablet (10 mg total) by mouth daily. 90 tablet 3   No facility-administered medications prior to visit.    Allergies  Allergen Reactions  . Amlodipine Shortness Of Breath    Pt states she also had very bad pain   . Diclofenac Nausea And Vomiting and Nausea Only  . Sulfa Antibiotics Hives and Rash  . Famciclovir Other (See Comments)  . Metronidazole Nausea And Vomiting  . Soma [Carisoprodol] Other (See Comments)    Moody and sleepy Cognitive Function, Daytime sleepiness  . Acyclovir   . Acyclovir And Related   . Benadryl [Diphenhydramine Hcl]     Severe emotional reaction and doesn't work well with Pt. Past history  . Cephalosporins Hives  . Codeine Other (See Comments)    Makes patient feel odd ; "sometimes mild; sometimes severe reaction; mostly severe"  . Cyclobenzaprine Other (See Comments)    Muscle Aches.  . Diclofenac Sodium Nausea And Vomiting  . Dicyclomine   . Hydrocodone   . Hydrocodone-Acetaminophen   .  Linzess [Linaclotide] Diarrhea  . Nitrofurantoin Monohyd Macro     Felt funny  . Suboxone [Buprenorphine Hcl-Naloxone Hcl] Nausea And Vomiting  . Tizanidine     Other reaction(s): Other (See Comments) insomnia  . Tramadol   . Baclofen Nausea Only  . Drug Ingredient [Ganciclovir] Rash  . Meloxicam     Other reaction(s): Confusion  . Sulfamethoxazole Rash    Review of Systems Per hpi      Objective:    Physical Exam Vitals and nursing note reviewed.  Constitutional:      Appearance: Normal appearance.  Cardiovascular:     Rate and Rhythm: Normal rate and regular rhythm.     Heart sounds: Normal heart sounds.  Pulmonary:     Effort: Pulmonary effort is normal. No respiratory  distress.  Skin:    General: Skin is warm and dry.     Capillary Refill: Capillary refill takes less than 2 seconds.     Coloration: Skin is not jaundiced or pale.     Findings: Erythema and lesion present. No bruising or rash.  Neurological:     General: No focal deficit present.     Mental Status: She is alert and oriented to person, place, and time. Mental status is at baseline.  Psychiatric:        Mood and Affect: Mood normal.        Behavior: Behavior normal.        Thought Content: Thought content normal.        Judgment: Judgment normal.     BP (!) 150/81   Pulse 91   Temp 98.6 F (37 C) (Temporal)   Resp 18   Ht 5\' 3"  (1.6 m)   Wt 178 lb 12.8 oz (81.1 kg)   SpO2 95%   BMI 31.67 kg/m  Wt Readings from Last 3 Encounters:  01/03/20 178 lb 12.8 oz (81.1 kg)  11/27/19 176 lb 9.6 oz (80.1 kg)  10/04/19 170 lb 12.8 oz (77.5 kg)    There are no preventive care reminders to display for this patient.  There are no preventive care reminders to display for this patient.   Lab Results  Component Value Date   TSH 4.460 10/04/2019   Lab Results  Component Value Date   WBC 7.8 10/04/2019   HGB 13.9 10/04/2019   HCT 41.5 10/04/2019   MCV 85 10/04/2019   PLT 219 10/04/2019   Lab Results  Component Value Date   NA 142 10/04/2019   K 4.1 10/04/2019   CO2 24 10/04/2019   GLUCOSE 93 10/04/2019   BUN 12 10/04/2019   CREATININE 0.62 10/04/2019   BILITOT 0.4 10/04/2019   ALKPHOS 80 10/04/2019   AST 15 10/04/2019   ALT 17 10/04/2019   PROT 6.3 10/04/2019   ALBUMIN 4.2 10/04/2019   CALCIUM 9.3 10/04/2019   ANIONGAP 11 01/01/2017   GFR 133.89 12/26/2016   Lab Results  Component Value Date   CHOL 180 10/04/2019   Lab Results  Component Value Date   HDL 63 10/04/2019   Lab Results  Component Value Date   LDLCALC 106 (H) 10/04/2019   Lab Results  Component Value Date   TRIG 57 10/04/2019   Lab Results  Component Value Date   CHOLHDL 2.9 10/04/2019    Lab Results  Component Value Date   HGBA1C 6.0 (H) 10/04/2019       Assessment & Plan:   Problem List Items Addressed This Visit  Cardiovascular and Mediastinum   Essential hypertension   Relevant Medications   felodipine (PLENDIL) 10 MG 24 hr tablet    Other Visit Diagnoses    Gastroesophageal reflux disease without esophagitis    -  Primary   Relevant Medications   omeprazole (PRILOSEC) 40 MG capsule   Open wound of right foot, initial encounter       Relevant Medications   lidocaine (XYLOCAINE) 5 % ointment   mupirocin ointment (BACTROBAN) 2 %       Meds ordered this encounter  Medications  . omeprazole (PRILOSEC) 40 MG capsule    Sig: Take 1 capsule (40 mg total) by mouth daily.    Dispense:  90 capsule    Refill:  3  . lidocaine (XYLOCAINE) 5 % ointment    Sig: Apply 1 application topically as needed.    Dispense:  35.44 g    Refill:  0    Order Specific Question:   Supervising Provider    Answer:   Carlota Raspberry, JEFFREY R [[id number]]  . mupirocin ointment (BACTROBAN) 2 %    Sig: Apply 1 application topically 2 (two) times daily.    Dispense:  22 g    Refill:  0    Order Specific Question:   Supervising Provider    Answer:   Carlota Raspberry, JEFFREY R [[id number]]  . felodipine (PLENDIL) 10 MG 24 hr tablet    Sig: Take 1 tablet (10 mg total) by mouth daily.    Dispense:  90 tablet    Refill:  3    Order Specific Question:   Supervising Provider    Answer:   Carlota Raspberry, JEFFREY R [[id number]]   PLAN  Appears as dry cracked skin. Given ongoing complication despite antifungal use, will give antibiotic  Will give topical lidocaine for pain - patient has had adverse reaction to diclofenac in the past - this was oral diclofenac and had nausea and GI upset. Discussed with patient that there may be some cross sensitivity but not likely - more likely she had an adverse reaction to diclofenac rather than allergy. Pt voices understanding and agrees to proceed with plan  Thoroughly reviewed  past intolerance of amlodipine and how this is related to felodipine - states that she has been on this for some time. No AEs. Hopes to continue. Understands risk  Patient encouraged to call clinic with any questions, comments, or concerns.   Maximiano Coss, NP

## 2020-01-07 ENCOUNTER — Telehealth: Payer: Self-pay | Admitting: *Deleted

## 2020-01-07 NOTE — Telephone Encounter (Signed)
Pt and husband came into the office.  Pt having issues with her aimovig autoinjector medication.  She brought it in and the medication had squirted out but she did not receive it.  I relayed for her to call [employer] and relayed what happened.  It may require a order from [place] to get another autoinjector.  She did have another one in the car and in  cooler in the car which her husband brought in.  LOT [id number] exp 10/2020.  I gave her instructions, and showed her how to do it.  She needs to hold skin taut and press down until hears click once then again it will be done.  I can understand how this could have happened.  I did not charge her for me giving the injection.

## 2020-01-28 ENCOUNTER — Other Ambulatory Visit: Payer: Self-pay | Admitting: Registered Nurse

## 2020-02-19 ENCOUNTER — Ambulatory Visit (INDEPENDENT_AMBULATORY_CARE_PROVIDER_SITE_OTHER): Payer: 59 | Admitting: Neurology

## 2020-02-19 ENCOUNTER — Telehealth: Payer: Self-pay | Admitting: Neurology

## 2020-02-19 ENCOUNTER — Encounter: Payer: Self-pay | Admitting: Neurology

## 2020-02-19 VITALS — BP 155/85 | HR 69 | Ht 64.0 in | Wt 172.5 lb

## 2020-02-19 DIAGNOSIS — M545 Low back pain, unspecified: Secondary | ICD-10-CM | POA: Diagnosis not present

## 2020-02-19 DIAGNOSIS — G43019 Migraine without aura, intractable, without status migrainosus: Secondary | ICD-10-CM | POA: Diagnosis not present

## 2020-02-19 DIAGNOSIS — G8929 Other chronic pain: Secondary | ICD-10-CM | POA: Diagnosis not present

## 2020-02-19 HISTORY — DX: Migraine without aura, intractable, without status migrainosus: G43.019

## 2020-02-19 MED ORDER — DULOXETINE HCL 30 MG PO CPEP: ORAL_CAPSULE | ORAL | 3 refills | Status: DC

## 2020-02-19 NOTE — Telephone Encounter (Signed)
I called and left message, I will call back later

## 2020-02-19 NOTE — Telephone Encounter (Signed)
I called the patient, she basically just wanted to know what type of pain she was having, I suspect that there are several sources such as from arthritis, potentially from nerve root irritation and from fibromyalgia.

## 2020-02-19 NOTE — Telephone Encounter (Signed)
Pt.'s husband [name] is on [place]. He has a question for Dr. [name] & would like for him to call him back.

## 2020-02-19 NOTE — Progress Notes (Signed)
Reason for visit: Headache, fibromyalgia, chronic mid back and low back pain, right leg pain  RIKI [name] is an 64 y.o. female  History of present illness:  Jasmine [name] is a 64 year old left-handed white female with a history of chronic neuromuscular pain.  She has also been treated through this office for headaches, she was placed on Aimovig which seems to have worked well for her headache.  She may have about 4 headaches a month but the headaches are not as severe.  She continues to have neck pain and pain in the mid back, and discomfort down both arms.  She has low back pain and some discomfort down the right leg.  She has recently undergone MRI of the lumbar spine that was done through [facility], she has severe 5-S1 facet arthritis with foraminal narrowing and some epidural fat with severe effacement of the thecal sac.  There is moderate to severe right L4-5 facet arthritis without neuroforaminal narrowing.  She has facet arthritis in the cervical spine as well at multiple levels with neuroforaminal stenosis.  She apparently was seen at one point through a pain center in [place], they discussed the possibility of a spinal stimulator but it was never done.  The patient has been seen recently by Dr. Tonita Cong, surgery was not recommended but they did an epidural steroid injection which was not helpful.  The patient remains on Norflex and gabapentin.  She returns the office today for further evaluation.  Past Medical History:  Diagnosis Date  . Achilles tendinosis of left lower extremity 01/23/2017  . Anxiety   . Asthma   . Benign paroxysmal positional vertigo 01/05/2016  . Bilateral sensorineural hearing loss 12/22/2014  . Blepharitis of upper and lower eyelids of both eyes 04/27/2016  . Bradycardia 09/22/2014  . Cervicogenic headache 08/24/2016  . Chronic high back pain   . CKD (chronic kidney disease) 12/08/2014  . Cochlear implant in place   . Colon polyp   . Constipation   .  Diverticulitis   . Diverticulosis   . DJD (degenerative joint disease)   . Double vision   . Dry eye syndrome of both lacrimal glands 04/27/2016  . Fibromyalgia   . Heart murmur   . High cholesterol   . Hypercalcemia   . Hypercholesterolemia   . Hyperlipidemia 02/01/2016  . Hypertension   . Hypoglycemia   . Insomnia 09/22/2010  . Kidney stone   . Meniscus tear    Right  . Migraine 08/07/2015  . Mild intermittent asthma without complication 67/61/9509  . Nephrolithiasis 03/17/2014  . Nocturnal leg cramps 05/31/2019  . Osteoporosis   . Primary hyperparathyroidism ([place]) 12/10/2014  . PTSD (post-traumatic stress disorder)   . Renal cyst 05/12/2013  . Schizophrenia ([place])    treated by Dr. Casimiro Needle  . Schizophrenia ([place])   . Seizures ([place])    last seizure 3 years ago, not on AED  . Stroke ([place]) 07/08/11   "I've had mini strokes before; left side of face  is more down than right "  . Syncope and collapse    One episode - 07/10/15  . TIA (transient ischemic attack)     Past Surgical History:  Procedure Laterality Date  . CATARACT EXTRACTION    . COCHLEAR IMPLANT  2010   left  . DILATION AND CURETTAGE OF UTERUS    . HERNIA REPAIR    . [place]   left  . KIDNEY STONE SURGERY  ~ 2006  .  PARATHYROIDECTOMY    . skin cancer removal    . TONSILLECTOMY     "as a child"  . tooth removal    . WISDOM TOOTH EXTRACTION      Family History  Problem Relation Age of Onset  . Heart attack Father 80       Living  . Arthritis Father   . Skin cancer Father   . Hypertension Father   . Hyperlipidemia Father   . Leukemia Mother 97       Deceased  . Alzheimer's disease Paternal Aunt        X2  . Stomach cancer Paternal Aunt        x1  . Arthritis/Rheumatoid Paternal Aunt        x1  . Neuropathy Brother        Peripheal  . Fibromyalgia Sister   . HIV Sister   . Fibromyalgia Sister   . Arthritis/Rheumatoid Sister   . Diabetes Paternal Grandmother   . Thyroid  disease Sister     Social history:  reports that she quit smoking about 15 years ago. Her smoking use included cigarettes. She has a 20.00 pack-year smoking history. She has never used smokeless tobacco. She reports current alcohol use. She reports that she does not use drugs.    Allergies  Allergen Reactions  . Amlodipine Shortness Of Breath    Pt states she also had very bad pain   . Diclofenac Nausea And Vomiting and Nausea Only  . Sulfa Antibiotics Hives and Rash  . Famciclovir Other (See Comments)  . Metronidazole Nausea And Vomiting  . Soma [Carisoprodol] Other (See Comments)    Moody and sleepy Cognitive Function, Daytime sleepiness  . Acyclovir   . Acyclovir And Related   . Benadryl [Diphenhydramine Hcl]     Severe emotional reaction and doesn't work well with Pt. Past history  . Cephalosporins Hives  . Codeine Other (See Comments)    Makes patient feel odd ; "sometimes mild; sometimes severe reaction; mostly severe"  . Cyclobenzaprine Other (See Comments)    Muscle Aches.  . Diclofenac Sodium Nausea And Vomiting  . Dicyclomine   . Hydrocodone   . Hydrocodone-Acetaminophen   . Linzess [Linaclotide] Diarrhea  . Nitrofurantoin Monohyd Macro     Felt funny  . Suboxone [Buprenorphine Hcl-Naloxone Hcl] Nausea And Vomiting  . Tizanidine     Other reaction(s): Other (See Comments) insomnia  . Tramadol   . Baclofen Nausea Only  . Drug Ingredient [Ganciclovir] Rash  . Meloxicam     Other reaction(s): Confusion  . Sulfamethoxazole Rash    Medications:  Prior to Admission medications   Medication Sig Start Date End Date Taking? Authorizing Provider  acetaminophen (TYLENOL) 500 MG tablet Take 500 mg by mouth every 8 (eight) hours as needed for moderate pain.   Yes [provider]  AIMOVIG 140 MG/ML SOAJ INJECT 140MG  UNDER THE SKIN EVERY 30 DAYS 12/03/19  Yes Kathrynn Ducking, MD  albuterol (VENTOLIN HFA) 108 (90 Base) MCG/ACT inhaler Inhale 2 puffs into the  lungs every 6 (six) hours as needed for wheezing or shortness of breath. 09/24/19  Yes Maximiano Coss, NP  aspirin 81 MG EC tablet Take 81 mg by mouth daily.   Yes [provider]  azelastine (ASTELIN) 0.1 % nasal spray Place 1 spray into both nostrils 2 (two) times daily. Use in each nostril as directed 11/14/19  Yes Maximiano Coss, NP  B COMPLEX VITAMINS PO Take by mouth.  Yes [provider]  clonazePAM (KLONOPIN) 1 MG tablet SMARTSIG:3 Tablet(s) By Mouth Every Other Day PRN 04/08/19  Yes [provider]  cloNIDine (CATAPRES) 0.1 MG tablet Take 1 tablet (0.1 mg total) by mouth as needed. Use as needed for blood pressure greater than 160/100 02/13/19  Yes Berton Bon, NP  felodipine (PLENDIL) 10 MG 24 hr tablet Take 1 tablet (10 mg total) by mouth daily. 01/03/20  Yes Janeece Agee, NP  fluticasone [facility]) 50 MCG/ACT nasal spray USE TWO SPRAY(S) IN EACH NOSTRIL ONCE DAILY 09/24/19  Yes Janeece Agee, NP  Fluticasone-Salmeterol (ADVAIR) 100-50 MCG/DOSE AEPB    Yes [provider]  gabapentin (NEURONTIN) 800 MG tablet Take 1 tablet (800 mg total) by mouth 3 (three) times daily. 06/13/19  Yes York Spaniel, MD  LACTOBACILLUS RHAMNOSUS, GG, PO Take by mouth.    Yes [provider]  levalbuterol Pauline Aus HFA) 45 MCG/ACT inhaler Inhale into the lungs.   Yes [provider]  lidocaine (XYLOCAINE) 5 % ointment Apply 1 application topically as needed. 01/03/20  Yes Janeece Agee, NP  losartan (COZAAR) 100 MG tablet Take 1 tablet (100 mg total) by mouth daily. 11/29/19  Yes Janeece Agee, NP  metoprolol succinate (TOPROL-XL) 25 MG 24 hr tablet Take 1 tablet (25 mg total) by mouth daily. Take 25 mg by mouth daily. 07/15/19  Yes Janeece Agee, NP  Multiple Vitamin (MULTIVITAMIN PO) Take by mouth.   Yes [provider]  omeprazole (PRILOSEC) 40 MG capsule Take 1 capsule (40 mg total) by mouth daily. 01/03/20  Yes Janeece Agee, NP   orphenadrine (NORFLEX) 100 MG tablet Take 1 tablet (100 mg total) by mouth 2 (two) times daily as needed for muscle spasms. 10/04/19  Yes Janeece Agee, NP  psyllium (METAMUCIL) 58.6 % packet Take 1 packet by mouth daily.   Yes [provider]  rosuvastatin (CRESTOR) 20 MG tablet TAKE ONE TABLET BY MOUTH EVERY EVENING 01/28/20  Yes Janeece Agee, NP  temazepam (RESTORIL) 15 MG capsule    Yes [provider]  zolpidem (AMBIEN) 10 MG tablet Take 2 tablets by mouth at bedtime.  04/06/16  Yes [provider]    ROS:  Out of a complete 14 system review of symptoms, the patient complains only of the following symptoms, and all other reviewed systems are negative.  Low back pain, mid back pain, neck pain Headache Walking difficulty  Blood pressure (!) 155/85, pulse 69, height 5\' 4"  (1.626 m), weight 172 lb 8 oz (78.2 kg).  Physical Exam  General: The patient is alert and cooperative at the time of the examination.  The patient is moderately obese.  Skin: No significant peripheral edema is noted.   Neurologic Exam  Mental status: The patient is alert and oriented x 3 at the time of the examination. The patient has apparent normal recent and remote memory, with an apparently normal attention span and concentration ability.   Cranial nerves: Facial symmetry is present. Speech is normal, no aphasia or dysarthria is noted. Extraocular movements are full. Visual fields are full.  Motor: The patient has good strength in all 4 extremities the patient does have a lot of giveaway weakness particular with the right leg.  Sensory examination: Soft touch sensation is symmetric on the face, arms, and legs.  Coordination: The patient has good finger-nose-finger and heel-to-shin bilaterally.  Gait and station: The patient has a slightly wide-based gait, the patient walk independently.  She has an unsteady tandem gait, unsteady  Romberg with a tendency to fall.  Some of the  instability issues appear to be over exaggerated.  Reflexes: Deep tendon reflexes are symmetric, reflexes are normal throughout.   Assessment/Plan:  1.  History of migraine headache  2.  Chronic neuromuscular discomfort, neck, mid back, low back  The patient does have multilevel degenerative changes that could result in neuromuscular discomfort.  We will start Cymbalta taking 30 mg daily for 2 weeks and then go to 30 mg twice daily.  The patient will be referred to Dr. Thyra Breed or associates for a chronic pain consult, possibly the patient could benefit from a spinal stimulator.  The patient will follow up here in 6 months.  She has done well with Aimovig, we will continue this for the headache.  Marlan Palau MD 02/19/2020 3:54 PM  [facility] [address] [place], [place] [address]  Phone [phone] Fax [phone]

## 2020-02-19 NOTE — Patient Instructions (Signed)
We will start cymbalta for the low back pain.  Cymbalta (duloxetine) is an antidepressant medication that is commonly used for peripheral neuropathy pain or for fibromyalgia pain. As with any antidepressant medication, worsening depression can be seen. This medication can potentially cause headache, dizziness, sexual dysfunction, or nausea. If any problems are noted on this medication, please contact our office.

## 2020-02-20 ENCOUNTER — Encounter: Payer: Self-pay | Admitting: Neurology

## 2020-02-26 ENCOUNTER — Other Ambulatory Visit: Payer: Self-pay

## 2020-02-26 ENCOUNTER — Telehealth (INDEPENDENT_AMBULATORY_CARE_PROVIDER_SITE_OTHER): Payer: 59 | Admitting: Registered Nurse

## 2020-02-26 ENCOUNTER — Encounter: Payer: Self-pay | Admitting: Registered Nurse

## 2020-02-26 DIAGNOSIS — R0989 Other specified symptoms and signs involving the circulatory and respiratory systems: Secondary | ICD-10-CM

## 2020-02-26 DIAGNOSIS — J988 Other specified respiratory disorders: Secondary | ICD-10-CM | POA: Diagnosis not present

## 2020-02-26 DIAGNOSIS — R059 Cough, unspecified: Secondary | ICD-10-CM

## 2020-02-26 DIAGNOSIS — R5383 Other fatigue: Secondary | ICD-10-CM | POA: Diagnosis not present

## 2020-02-26 DIAGNOSIS — B9689 Other specified bacterial agents as the cause of diseases classified elsewhere: Secondary | ICD-10-CM

## 2020-02-26 MED ORDER — AZITHROMYCIN 250 MG PO TABS: ORAL_TABLET | ORAL | 0 refills | Status: DC

## 2020-02-26 NOTE — Progress Notes (Signed)
Telemedicine Encounter- SOAP NOTE Established Patient  This telephone encounter was conducted with the patient's (or proxy's) verbal consent via audio telecommunications: yes  Patient was instructed to have this encounter in a suitably private space; and to only have persons present to whom they give permission to participate. In addition, patient identity was confirmed by use of name plus two identifiers (DOB and address).  I discussed the limitations, risks, security and privacy concerns of performing an evaluation and management service by telephone and the availability of in person appointments. I also discussed with the patient that there may be a patient responsible charge related to this service. The patient expressed understanding and agreed to proceed.  I spent a total of 15 minutes talking with the patient or their proxy.  Patient at home Provider in office  Chief Complaint  Patient presents with  . Cough    Patient states for the last four days she was feeling weak , coughing and congestion. Per patient she has taken some OTC cough medication but it do not seem to help.    Subjective   Jasmine [name] is a 64 y.o. established patient. Telephone visit today for cough  HPI Onset 1-2 weeks ago Worsened, but now improving No sick contacts Cough with some phlegm No fevers Some shob, had asthma attack, resolved quickly with albuterol Had one mechanical fall - tripped when helping husband. Not related to illness No loc, dizziness, shob Having sinus congestion OTCs not helping Had JJ vaccine in early April, no booster Did get flu shot this year.  Patient Active Problem List   Diagnosis Date Noted  . Common migraine with intractable migraine 02/19/2020  . Nocturnal leg cramps 05/31/2019  . Anorgasmia of female 05/22/2018  . Abnormal weight loss 05/22/2018  . Degeneration of spine 05/22/2018  . Depressive disorder 05/22/2018  . Diverticulitis 05/22/2018  . Achilles  tendinosis of left lower extremity 01/23/2017  . Cervicogenic headache 08/24/2016  . Pain of toe of left foot 05/19/2016  . Left shoulder pain 05/11/2016  . Dry eye syndrome of both lacrimal glands 04/27/2016  . Hyperlipidemia 02/01/2016  . Benign paroxysmal positional vertigo 01/05/2016  . Fibromyalgia 10/31/2015  . Migraine 08/07/2015  . Neck pain 08/07/2015  . Myalgia and myositis 03/04/2015  . Bilateral sensorineural hearing loss 12/22/2014  . DJD (degenerative joint disease) of knee 12/09/2014  . Essential hypertension 12/08/2014  . Bradycardia 09/22/2014  . Hypercalcemia 09/22/2014  . Osteoporosis 12/23/2013  . DJD (degenerative joint disease) 08/19/2013  . Renal cyst 05/12/2013  . Obesity 09/19/2012  . Chronic low back pain 11/30/2011  . Schizophrenia ([facility]) 07/14/2011  . Insomnia 09/22/2010  . Perennial allergic rhinitis with seasonal variation 09/22/2010  . Mild intermittent asthma without complication 09/22/2010  . Hypercholesteremia 09/22/2010    Past Medical History:  Diagnosis Date  . Achilles tendinosis of left lower extremity 01/23/2017  . Anxiety   . Asthma   . Benign paroxysmal positional vertigo 01/05/2016  . Bilateral sensorineural hearing loss 12/22/2014  . Blepharitis of upper and lower eyelids of both eyes 04/27/2016  . Bradycardia 09/22/2014  . Cervicogenic headache 08/24/2016  . Chronic high back pain   . CKD (chronic kidney disease) 12/08/2014  . Cochlear implant in place   . Colon polyp   . Common migraine with intractable migraine 02/19/2020  . Constipation   . Diverticulitis   . Diverticulosis   . DJD (degenerative joint disease)   . Double vision   . Dry eye  syndrome of both lacrimal glands 04/27/2016  . Fibromyalgia   . Heart murmur   . High cholesterol   . Hypercalcemia   . Hypercholesterolemia   . Hyperlipidemia 02/01/2016  . Hypertension   . Hypoglycemia   . Insomnia 09/22/2010  . Kidney stone   . Meniscus tear    Right  .  Migraine 08/07/2015  . Mild intermittent asthma without complication 09/22/2010  . Nephrolithiasis 03/17/2014  . Nocturnal leg cramps 05/31/2019  . Osteoporosis   . Primary hyperparathyroidism ([facility]) 12/10/2014  . PTSD (post-traumatic stress disorder)   . Renal cyst 05/12/2013  . Schizophrenia ([facility])    treated by Dr. Donell Beers  . Schizophrenia ([facility])   . Seizures ([facility])    last seizure 3 years ago, not on AED  . Stroke ([facility]) 07/08/11   "I've had mini strokes before; left side of face  is more down than right "  . Syncope and collapse    One episode - 07/10/15  . TIA (transient ischemic attack)     Current Outpatient Medications  Medication Sig Dispense Refill  . acetaminophen (TYLENOL) 500 MG tablet Take 500 mg by mouth every 8 (eight) hours as needed for moderate pain.    Marland Kitchen AIMOVIG 140 MG/ML SOAJ INJECT 140MG  UNDER THE SKIN EVERY 30 DAYS 1 mL 5  . albuterol (VENTOLIN HFA) 108 (90 Base) MCG/ACT inhaler Inhale 2 puffs into the lungs every 6 (six) hours as needed for wheezing or shortness of breath. 6.7 g 2  . aspirin 81 MG EC tablet Take 81 mg by mouth daily.    azelastine (ASTELIN) 0.1 % nasal spray Place 1 spray into both nostrils 2 (two) times daily. Use in each nostril as directed 30 mL 6  . azithromycin (ZITHROMAX) 250 MG tablet Take 2 tabs on first day. Then take 1 tab daily. Finish entire supply. 6 tablet 0  . B COMPLEX VITAMINS PO Take by mouth.    . clonazePAM (KLONOPIN) 1 MG tablet SMARTSIG:3 Tablet(s) By Mouth Every Other Day PRN    . cloNIDine (CATAPRES) 0.1 MG tablet Take 1 tablet (0.1 mg total) by mouth as needed. Use as needed for blood pressure greater than 160/100 30 tablet 3  . DULoxetine (CYMBALTA) 30 MG capsule One capsule daily for 2 weeks, then take one twice a day 60 capsule 3  . felodipine (PLENDIL) 10 MG 24 hr tablet Take 1 tablet (10 mg total) by mouth daily. 90 tablet 3  . fluticasone (FLONASE) 50 MCG/ACT nasal spray USE TWO SPRAY(S) IN EACH NOSTRIL ONCE DAILY 16 g 5   . Fluticasone-Salmeterol (ADVAIR) 100-50 MCG/DOSE AEPB     . gabapentin (NEURONTIN) 800 MG tablet Take 1 tablet (800 mg total) by mouth 3 (three) times daily. 270 tablet 1  . LACTOBACILLUS RHAMNOSUS, GG, PO Take by mouth.     . levalbuterol (XOPENEX HFA) 45 MCG/ACT inhaler Inhale into the lungs.    . lidocaine (XYLOCAINE) 5 % ointment Apply 1 application topically as needed. 35.44 g 0  . losartan (COZAAR) 100 MG tablet Take 1 tablet (100 mg total) by mouth daily. 30 tablet 2  . metoprolol succinate (TOPROL-XL) 25 MG 24 hr tablet Take 1 tablet (25 mg total) by mouth daily. Take 25 mg by mouth daily. 90 tablet 3  . Multiple Vitamin (MULTIVITAMIN PO) Take by mouth.    Marland Kitchen omeprazole (PRILOSEC) 40 MG capsule Take 1 capsule (40 mg total) by mouth daily. 90 capsule 3  . orphenadrine (NORFLEX) 100 MG  tablet Take 1 tablet (100 mg total) by mouth 2 (two) times daily as needed for muscle spasms. 180 tablet 1  . psyllium (METAMUCIL) 58.6 % packet Take 1 packet by mouth daily.    . rosuvastatin (CRESTOR) 20 MG tablet TAKE ONE TABLET BY MOUTH EVERY EVENING 90 tablet 2  . temazepam (RESTORIL) 15 MG capsule     . zolpidem (AMBIEN) 10 MG tablet Take 2 tablets by mouth at bedtime.      No current facility-administered medications for this visit.    Allergies  Allergen Reactions  . Amlodipine Shortness Of Breath    Pt states she also had very bad pain   . Diclofenac Nausea And Vomiting and Nausea Only  . Sulfa Antibiotics Hives and Rash  . Famciclovir Other (See Comments)  . Metronidazole Nausea And Vomiting  . Soma [Carisoprodol] Other (See Comments)    Moody and sleepy Cognitive Function, Daytime sleepiness  . Acyclovir   . Acyclovir And Related   . Benadryl [Diphenhydramine Hcl]     Severe emotional reaction and doesn't work well with Pt. Past history  . Cephalosporins Hives  . Codeine Other (See Comments)    Makes patient feel odd ; "sometimes mild; sometimes severe reaction; mostly severe"  .  Cyclobenzaprine Other (See Comments)    Muscle Aches.  . Diclofenac Sodium Nausea And Vomiting  . Dicyclomine   . Hydrocodone   . Hydrocodone-Acetaminophen   . Linzess [Linaclotide] Diarrhea  . Nitrofurantoin Monohyd Macro     Felt funny  . Suboxone [Buprenorphine Hcl-Naloxone Hcl] Nausea And Vomiting  . Tizanidine     Other reaction(s): Other (See Comments) insomnia  . Tramadol   . Baclofen Nausea Only  . Drug Ingredient [Ganciclovir] Rash  . Meloxicam     Other reaction(s): Confusion  . Sulfamethoxazole Rash    Social History   Socioeconomic History  . Marital status: Married    Spouse name: [name]  . Number of children: 3  . Years of education: 2 yrs coll  . Highest education level: Not on file  Occupational History  . Occupation: Disabled  Tobacco Use  . Smoking status: Former Smoker    Packs/day: 1.00    Years: 3.00    Pack years: 3.00    Types: Cigarettes    Quit date: 02/29/2004    Years since quitting: 16.0  . Smokeless tobacco: Never Used  Vaping Use  . Vaping Use: Never used  Substance and Sexual Activity  . Alcohol use: Yes    Alcohol/week: 0.0 standard drinks    Comment: "glass of wine q once in awhile; not very often; do it on special occasion"  . Drug use: No  . Sexual activity: Never  Other Topics Concern  . Not on file  Social History Narrative   Completed 2 years of college.    On disability.   Lives at home with her husband.   No caffeine use.   Left-handed.   Three children, 2 biological - 1 adopted.   Social Determinants of Health   Financial Resource Strain: Not on file  Food Insecurity: Not on file  Transportation Needs: Not on file  Physical Activity: Not on file  Stress: Not on file  Social Connections: Not on file  Intimate Partner Violence: Not on file    ROS Per hpi   Objective   Vitals as reported by the patient: There were no vitals filed for this visit.  [place] was seen today for cough.  Diagnoses and  all orders  for this visit:  Bacterial respiratory infection -     azithromycin (ZITHROMAX) 250 MG tablet; Take 2 tabs on first day. Then take 1 tab daily. Finish entire supply.  Cough -     COVID-19, Flu A+B and RSV; Future  Chest congestion -     COVID-19, Flu A+B and RSV; Future  Other fatigue -     COVID-19, Flu A+B and RSV; Future   PLAN  covid vs. Bacterial respiratory infection  z pack  Continue otc relief  Present for drive up testing  Return precautions reviewed, reviewed reasons to return to clinic  Patient encouraged to call clinic with any questions, comments, or concerns.   I discussed the assessment and treatment plan with the patient. The patient was provided an opportunity to ask questions and all were answered. The patient agreed with the plan and demonstrated an understanding of the instructions.   The patient was advised to call back or seek an in-person evaluation if the symptoms worsen or if the condition fails to improve as anticipated.  I provided 15 minutes of non-face-to-face time during this encounter.  Maximiano Coss, NP  Primary Care at [facility]

## 2020-02-26 NOTE — Patient Instructions (Signed)
° ° ° °  If you have lab work done today you will be contacted with your lab results within the next 2 weeks.  If you have not heard from us then please contact us. The fastest way to get your results is to register for My Chart. ° ° °IF you received an x-ray today, you will receive an invoice from [facility]. Please contact [facility] at [phone] with questions or concerns regarding your invoice.  ° °IF you received labwork today, you will receive an invoice from [facility]. Please contact [facility] at [phone] with questions or concerns regarding your invoice.  ° °Our billing staff will not be able to assist you with questions regarding bills from these companies. ° °You will be contacted with the lab results as soon as they are available. The fastest way to get your results is to activate your My Chart account. Instructions are located on the last page of this paperwork. If you have not heard from us regarding the results in 2 weeks, please contact this office. °  ° ° ° °

## 2020-02-27 ENCOUNTER — Telehealth: Payer: Self-pay | Admitting: Registered Nurse

## 2020-02-27 ENCOUNTER — Encounter: Payer: Self-pay | Admitting: Registered Nurse

## 2020-02-27 ENCOUNTER — Other Ambulatory Visit: Payer: Self-pay | Admitting: Family Medicine

## 2020-02-27 ENCOUNTER — Other Ambulatory Visit: Payer: Self-pay

## 2020-02-27 DIAGNOSIS — J302 Other seasonal allergic rhinitis: Secondary | ICD-10-CM

## 2020-02-27 DIAGNOSIS — J3089 Other allergic rhinitis: Secondary | ICD-10-CM

## 2020-02-27 MED ORDER — ALBUTEROL SULFATE (2.5 MG/3ML) 0.083% IN NEBU
2.5000 mg | INHALATION_SOLUTION | Freq: Four times a day (QID) | RESPIRATORY_TRACT | 0 refills | Status: DC | PRN
Start: 2020-02-27 — End: 2020-09-08

## 2020-02-27 MED ORDER — LEVALBUTEROL TARTRATE 45 MCG/ACT IN AERO: 2.0000 | INHALATION_SPRAY | Freq: Three times a day (TID) | RESPIRATORY_TRACT | 3 refills | Status: AC | PRN

## 2020-02-27 MED ORDER — ALBUTEROL SULFATE HFA 108 (90 BASE) MCG/ACT IN AERS: 2.0000 | INHALATION_SPRAY | Freq: Four times a day (QID) | RESPIRATORY_TRACT | 2 refills | Status: DC | PRN

## 2020-02-27 MED ORDER — FLUTICASONE PROPIONATE 50 MCG/ACT NA SUSP: NASAL | 5 refills | Status: DC

## 2020-02-27 NOTE — Addendum Note (Signed)
Addended by: Imogene Burn A on: 02/27/2020 02:07 PM   Modules accepted: Orders

## 2020-02-27 NOTE — Telephone Encounter (Addendum)
02/27/2020 - PATIENT HAD A VIRTUAL APPOINTMENT WITH [facility] ON 02/26/2020. SHE WAS DIAGNOSED WITH A BACTERIAL RESPIRATORY INFECTION. SHE SAID ONLY THE FLONASE NASAL SPRAY AND HER ASTHMA MACHINE HELPED. PLEASE SEE HER DETAILED MY-CHART MESSAGE ON 02/26/2020. SHE WANTS TO  KNOW WHAT SHE SHOULD DO NEXT AND WOULD LIKE A CALL BACK AS SOON AS POSSIBLE. BEST PHONE [phone] (CELL)  [facility]

## 2020-02-27 NOTE — Telephone Encounter (Signed)
Old rx for neb from when she had pneumonia earlier this year will remind of the COVID test

## 2020-02-27 NOTE — Telephone Encounter (Signed)
Following this message I called her and sent her a my chart message (as form of documentation) with instructions to be seen at the hospital if it is this severe pt was unhappy and wanted the nebulizer. Denied her request and informed her we could not help her if it was that severe she needed immediate attention

## 2020-02-27 NOTE — Telephone Encounter (Signed)
The inhaler is the same medication as the nebulizer. They also have the same effectiveness when used correctly. I would rather her continue with the inhalers.

## 2020-02-27 NOTE — Telephone Encounter (Signed)
Hello, I only see inhalers ordered for her but I sent those to her pharmacy. Rich did place an order for future covid testing yesterday so remind her to come for that at some point today.

## 2020-02-27 NOTE — Telephone Encounter (Signed)
Pt called back and was confused on why the nebulizer was not in the pharmacy. I read pt message below from FNP Just. Told pt that the two inhalers are the same effectiveness as the nebulizer. Please advise.

## 2020-02-27 NOTE — Telephone Encounter (Signed)
Called and spoke to [place] and handled this in a different message to the provider

## 2020-02-27 NOTE — Telephone Encounter (Signed)
02/27/2020 - I RECEIVED A CALL FROM [facility] ON [address]. HE SAID THE PATIENT IS SAYING SHE SHOULD HAVE GOTTEN DUONED MEDICATION CALLED INTO THE PHARMACY. I READ TO THE PHARMACIST WHAT Jasmine [name]'S MESSAGE READ. HE SAID HE WOULD LET THE PATIENT KNOW. HE SAID THE PATIENT IS SAYING "SHE IS GOING TO DIE."  MBC

## 2020-02-27 NOTE — Telephone Encounter (Signed)
Called and discussed with the patient.

## 2020-02-27 NOTE — Telephone Encounter (Signed)
Pt reports still struggling to breathe and states nebulizer helped would you please prescribe so she does not have to go long weekend without

## 2020-02-28 ENCOUNTER — Encounter: Payer: Self-pay | Admitting: Registered Nurse

## 2020-03-01 LAB — COVID-19, FLU A+B AND RSV
Influenza A, NAA: NOT DETECTED
Influenza B, NAA: NOT DETECTED
RSV, NAA: NOT DETECTED
SARS-CoV-2, NAA: DETECTED — AB

## 2020-03-02 ENCOUNTER — Encounter: Payer: Self-pay | Admitting: Registered Nurse

## 2020-03-02 NOTE — Telephone Encounter (Signed)
Pt wants a nebulizer treatment given hacking and coughing. Pt also wants emergency air tank for situations like this so she doesn't have to go to ER pt also has COVID swab results please advise

## 2020-03-02 NOTE — Telephone Encounter (Signed)
Pt wants you to tell her when she will be negative so she can go back to PT  Pt also wants oxygen tank in case of emergency please advise

## 2020-03-05 ENCOUNTER — Telehealth: Payer: Self-pay | Admitting: Registered Nurse

## 2020-03-05 NOTE — Telephone Encounter (Signed)
03/05/2020 - PATIENT CALLED TO SAY SHE HAS LEFT RICH MORROW A MY-CHART MESSAGE AND SHE WOULD LIKE TO SPEAK TO HIM PERSONALLY. SHE WOULD LIKE A CALL BACK AS SOON AS POSSIBLE. I TOLD HER THAT RICH IS OFF TODAY BUT I WILL LET RICH KNOW SHE CALLED. BEST PHONE [phone] (CELL) MBC

## 2020-03-06 ENCOUNTER — Telehealth: Payer: Self-pay | Admitting: Registered Nurse

## 2020-03-06 NOTE — Telephone Encounter (Signed)
Pt checking on status of this message. Please advise 

## 2020-03-09 NOTE — Telephone Encounter (Signed)
Request to make an appointment came via fax/ called patient spoke with patient ,patient declined appt . Patient starting to feel better

## 2020-03-10 NOTE — Telephone Encounter (Signed)
If she wants to speak to richard directly she must make an appointment.

## 2020-03-16 NOTE — Telephone Encounter (Signed)
My apologies for the late reply, we have had a very high patient message volume and we are trying to care for everyone as quickly as possible. We appreciate your continued patience and we are working diligently to get to each of your concerns.  This is what I have sent to everyone else as we are working through the high numbers but she's gotten mad when its been me who responds. Please help

## 2020-03-18 ENCOUNTER — Ambulatory Visit: Payer: 59 | Admitting: Neurology

## 2020-03-18 ENCOUNTER — Telehealth (INDEPENDENT_AMBULATORY_CARE_PROVIDER_SITE_OTHER): Payer: 59 | Admitting: Registered Nurse

## 2020-03-18 ENCOUNTER — Other Ambulatory Visit: Payer: Self-pay

## 2020-03-18 ENCOUNTER — Encounter: Payer: Self-pay | Admitting: Registered Nurse

## 2020-03-18 DIAGNOSIS — J22 Unspecified acute lower respiratory infection: Secondary | ICD-10-CM | POA: Diagnosis not present

## 2020-03-18 MED ORDER — PREDNISONE 20 MG PO TABS: 20.0000 mg | ORAL_TABLET | Freq: Every day | ORAL | 0 refills | Status: DC

## 2020-03-18 MED ORDER — AMOXICILLIN-POT CLAVULANATE 875-125 MG PO TABS: 1.0000 | ORAL_TABLET | Freq: Two times a day (BID) | ORAL | 0 refills | Status: DC

## 2020-03-18 NOTE — Patient Instructions (Signed)
° ° ° °  If you have lab work done today you will be contacted with your lab results within the next 2 weeks.  If you have not heard from us then please contact us. The fastest way to get your results is to register for My Chart. ° ° °IF you received an x-ray today, you will receive an invoice from [facility]. Please contact [facility] at [phone] with questions or concerns regarding your invoice.  ° °IF you received labwork today, you will receive an invoice from [facility]. Please contact [facility] at [phone] with questions or concerns regarding your invoice.  ° °Our billing staff will not be able to assist you with questions regarding bills from these companies. ° °You will be contacted with the lab results as soon as they are available. The fastest way to get your results is to activate your My Chart account. Instructions are located on the last page of this paperwork. If you have not heard from us regarding the results in 2 weeks, please contact this office. °  ° ° ° °

## 2020-03-23 ENCOUNTER — Encounter: Payer: Self-pay | Admitting: Registered Nurse

## 2020-03-25 ENCOUNTER — Other Ambulatory Visit: Payer: Self-pay

## 2020-03-25 ENCOUNTER — Ambulatory Visit (INDEPENDENT_AMBULATORY_CARE_PROVIDER_SITE_OTHER): Payer: 59 | Admitting: Registered Nurse

## 2020-03-25 ENCOUNTER — Encounter: Payer: Self-pay | Admitting: Registered Nurse

## 2020-03-25 VITALS — BP 144/86 | HR 70 | Temp 98.0°F | Resp 18 | Ht 64.0 in | Wt 172.0 lb

## 2020-03-25 DIAGNOSIS — U071 COVID-19: Secondary | ICD-10-CM | POA: Diagnosis not present

## 2020-03-25 LAB — CBC WITH DIFFERENTIAL
Basophils Absolute: 0 10*3/uL (ref 0.0–0.2)
Basos: 0 %
EOS (ABSOLUTE): 0.1 10*3/uL (ref 0.0–0.4)
Eos: 1 %
Hematocrit: 42.4 % (ref 34.0–46.6)
Hemoglobin: 14.1 g/dL (ref 11.1–15.9)
Immature Grans (Abs): 0.1 10*3/uL (ref 0.0–0.1)
Immature Granulocytes: 1 %
Lymphocytes Absolute: 3 10*3/uL (ref 0.7–3.1)
Lymphs: 30 %
MCH: 28.8 pg (ref 26.6–33.0)
MCHC: 33.3 g/dL (ref 31.5–35.7)
MCV: 87 fL (ref 79–97)
Monocytes Absolute: 0.5 10*3/uL (ref 0.1–0.9)
Monocytes: 5 %
Neutrophils Absolute: 6.1 10*3/uL (ref 1.4–7.0)
Neutrophils: 63 %
RBC: 4.9 x10E6/uL (ref 3.77–5.28)
RDW: 13.1 % (ref 11.7–15.4)
WBC: 9.8 10*3/uL (ref 3.4–10.8)

## 2020-03-25 LAB — COMPREHENSIVE METABOLIC PANEL
ALT: 14 IU/L (ref 0–32)
AST: 16 IU/L (ref 0–40)
Albumin/Globulin Ratio: 1.9 (ref 1.2–2.2)
Albumin: 4.4 g/dL (ref 3.8–4.8)
Alkaline Phosphatase: 81 IU/L (ref 44–121)
BUN/Creatinine Ratio: 22 (ref 12–28)
BUN: 15 mg/dL (ref 8–27)
Bilirubin Total: 0.4 mg/dL (ref 0.0–1.2)
CO2: 24 mmol/L (ref 20–29)
Calcium: 9.8 mg/dL (ref 8.7–10.3)
Chloride: 106 mmol/L (ref 96–106)
Creatinine, Ser: 0.68 mg/dL (ref 0.57–1.00)
GFR calc Af Amer: 108 mL/min/{1.73_m2} (ref >59)
GFR calc non Af Amer: 94 mL/min/{1.73_m2} (ref >59)
Globulin, Total: 2.3 g/dL (ref 1.5–4.5)
Glucose: 83 mg/dL (ref 65–99)
Potassium: 4.6 mmol/L (ref 3.5–5.2)
Sodium: 145 mmol/L — ABNORMAL HIGH (ref 134–144)
Total Protein: 6.7 g/dL (ref 6.0–8.5)

## 2020-03-25 NOTE — Progress Notes (Signed)
Established Patient Office Visit  Subjective:  Patient ID: Jasmine [name], female    DOB: 1955-08-30  Age: 65 y.o. MRN: [id number]  CC:  Chief Complaint  Patient presents with  . Follow-up    Patient states she is here just to follow up since she had covid in December . Per patient she just wants to make sure she is ok but no symptoms.    HPI Jasmine [name] presents for covid follow up  Feeling much improved. No lingering symptoms Has concern since her husband has unfortunately developed pneumonia after his course of covid-19, he is having a tough time.  Otherwise no concerns, feeling well.  Past Medical History:  Diagnosis Date  . Achilles tendinosis of left lower extremity 01/23/2017  . Anxiety   . Asthma   . Benign paroxysmal positional vertigo 01/05/2016  . Bilateral sensorineural hearing loss 12/22/2014  . Blepharitis of upper and lower eyelids of both eyes 04/27/2016  . Bradycardia 09/22/2014  . Cervicogenic headache 08/24/2016  . Chronic high back pain   . CKD (chronic kidney disease) 12/08/2014  . Cochlear implant in place   . Colon polyp   . Common migraine with intractable migraine 02/19/2020  . Constipation   . Diverticulitis   . Diverticulosis   . DJD (degenerative joint disease)   . Double vision   . Dry eye syndrome of both lacrimal glands 04/27/2016  . Fibromyalgia   . Heart murmur   . High cholesterol   . Hypercalcemia   . Hypercholesterolemia   . Hyperlipidemia 02/01/2016  . Hypertension   . Hypoglycemia   . Insomnia 09/22/2010  . Kidney stone   . Meniscus tear    Right  . Migraine 08/07/2015  . Mild intermittent asthma without complication 16/60/6301  . Nephrolithiasis 03/17/2014  . Nocturnal leg cramps 05/31/2019  . Osteoporosis   . Primary hyperparathyroidism ([place]) 12/10/2014  . PTSD (post-traumatic stress disorder)   . Renal cyst 05/12/2013  . Schizophrenia ([place])    treated by Dr. Casimiro Needle  . Schizophrenia ([place])   . Seizures ([place])     last seizure 3 years ago, not on AED  . Stroke ([place]) 07/08/11   "I've had mini strokes before; left side of face  is more down than right "  . Syncope and collapse    One episode - 07/10/15  . TIA (transient ischemic attack)     Past Surgical History:  Procedure Laterality Date  . CATARACT EXTRACTION    . COCHLEAR IMPLANT  2010   left  . DILATION AND CURETTAGE OF UTERUS    . HERNIA REPAIR    . [place]   left  . KIDNEY STONE SURGERY  ~ 2006  . PARATHYROIDECTOMY    . skin cancer removal    . TONSILLECTOMY     "as a child"  . tooth removal    . WISDOM TOOTH EXTRACTION      Family History  Problem Relation Age of Onset  . Heart attack Father 56       Living  . Arthritis Father   . Skin cancer Father   . Hypertension Father   . Hyperlipidemia Father   . Leukemia Mother 42       Deceased  . Alzheimer's disease Paternal Aunt        X2  . Stomach cancer Paternal Aunt        x1  . Arthritis/Rheumatoid Paternal Aunt  x1  . Neuropathy Brother        Peripheal  . Fibromyalgia Sister   . Neuropathy Sister   . HIV Sister   . Fibromyalgia Sister   . Neuropathy Sister   . Arthritis/Rheumatoid Sister   . Neuropathy Sister   . Diabetes Paternal Grandmother   . Thyroid disease Sister   . Neuropathy Sister     Social History   Socioeconomic History  . Marital status: Married    Spouse name: [name]  . Number of children: 3  . Years of education: 2 yrs coll  . Highest education level: Not on file  Occupational History  . Occupation: Disabled  Tobacco Use  . Smoking status: Former Smoker    Packs/day: 1.00    Years: 3.00    Pack years: 3.00    Types: Cigarettes    Quit date: 02/29/2004    Years since quitting: 16.0  . Smokeless tobacco: Never Used  Vaping Use  . Vaping Use: Never used  Substance and Sexual Activity  . Alcohol use: Yes    Alcohol/week: 0.0 standard drinks    Comment: "glass of wine q once in awhile; not very often; do it on  special occasion"  . Drug use: No  . Sexual activity: Never  Other Topics Concern  . Not on file  Social History Narrative   Completed 2 years of college.    On disability.   Lives at home with her husband.   No caffeine use.   Left-handed.   Three children, 2 biological - 1 adopted.   Social Determinants of Health   Financial Resource Strain: Not on file  Food Insecurity: Not on file  Transportation Needs: Not on file  Physical Activity: Not on file  Stress: Not on file  Social Connections: Not on file  Intimate Partner Violence: Not on file    Outpatient Medications Prior to Visit  Medication Sig Dispense Refill  . acetaminophen (TYLENOL) 500 MG tablet Take 500 mg by mouth every 8 (eight) hours as needed for moderate pain.    Marland Kitchen AIMOVIG 140 MG/ML SOAJ INJECT 140MG  UNDER THE SKIN EVERY 30 DAYS 1 mL 5  . albuterol (PROVENTIL) (2.5 MG/3ML) 0.083% nebulizer solution Take 3 mLs (2.5 mg total) by nebulization every 6 (six) hours as needed for wheezing or shortness of breath. 75 mL 0  . albuterol (VENTOLIN HFA) 108 (90 Base) MCG/ACT inhaler Inhale 2 puffs into the lungs every 6 (six) hours as needed for wheezing or shortness of breath. 6.7 g 2  . amoxicillin-clavulanate (AUGMENTIN) 875-125 MG tablet Take 1 tablet by mouth 2 (two) times daily. 14 tablet 0  . aspirin 81 MG EC tablet Take 81 mg by mouth daily.    Marland Kitchen azelastine (ASTELIN) 0.1 % nasal spray Place 1 spray into both nostrils 2 (two) times daily. Use in each nostril as directed 30 mL 6  . azithromycin (ZITHROMAX) 250 MG tablet Take 2 tabs on first day. Then take 1 tab daily. Finish entire supply. 6 tablet 0  . B COMPLEX VITAMINS PO Take by mouth.    . clonazePAM (KLONOPIN) 1 MG tablet SMARTSIG:3 Tablet(s) By Mouth Every Other Day PRN    . cloNIDine (CATAPRES) 0.1 MG tablet Take 1 tablet (0.1 mg total) by mouth as needed. Use as needed for blood pressure greater than 160/100 30 tablet 3  . DULoxetine (CYMBALTA) 30 MG capsule One  capsule daily for 2 weeks, then take one twice a day 60 capsule 3  .  felodipine (PLENDIL) 10 MG 24 hr tablet Take 1 tablet (10 mg total) by mouth daily. 90 tablet 3  . fluticasone (FLONASE) 50 MCG/ACT nasal spray USE TWO SPRAY(S) IN EACH NOSTRIL ONCE DAILY 16 g 5  . Fluticasone-Salmeterol (ADVAIR) 100-50 MCG/DOSE AEPB     . gabapentin (NEURONTIN) 800 MG tablet Take 1 tablet (800 mg total) by mouth 3 (three) times daily. 270 tablet 1  . LACTOBACILLUS RHAMNOSUS, GG, PO Take by mouth.     . levalbuterol (XOPENEX HFA) 45 MCG/ACT inhaler Inhale 2 puffs into the lungs every 8 (eight) hours as needed for wheezing. 1 each 3  . lidocaine (XYLOCAINE) 5 % ointment Apply 1 application topically as needed. 35.44 g 0  . losartan (COZAAR) 100 MG tablet Take 1 tablet (100 mg total) by mouth daily. 30 tablet 2  . metoprolol succinate (TOPROL-XL) 25 MG 24 hr tablet Take 1 tablet (25 mg total) by mouth daily. Take 25 mg by mouth daily. 90 tablet 3  . Multiple Vitamin (MULTIVITAMIN PO) Take by mouth.    Marland Kitchen omeprazole (PRILOSEC) 40 MG capsule Take 1 capsule (40 mg total) by mouth daily. 90 capsule 3  . orphenadrine (NORFLEX) 100 MG tablet Take 1 tablet (100 mg total) by mouth 2 (two) times daily as needed for muscle spasms. 180 tablet 1  . predniSONE (DELTASONE) 20 MG tablet Take 1 tablet (20 mg total) by mouth daily with breakfast. 4 tablet 0  . psyllium (METAMUCIL) 58.6 % packet Take 1 packet by mouth daily.    . rosuvastatin (CRESTOR) 20 MG tablet TAKE ONE TABLET BY MOUTH EVERY EVENING 90 tablet 2  . temazepam (RESTORIL) 15 MG capsule     . zolpidem (AMBIEN) 10 MG tablet Take 2 tablets by mouth at bedtime.      No facility-administered medications prior to visit.    Allergies  Allergen Reactions  . Amlodipine Shortness Of Breath    Pt states she also had very bad pain   . Diclofenac Nausea And Vomiting and Nausea Only  . Sulfa Antibiotics Hives and Rash  . Famciclovir Other (See Comments)  . Metronidazole  Nausea And Vomiting  . Soma [Carisoprodol] Other (See Comments)    Moody and sleepy Cognitive Function, Daytime sleepiness  . Acyclovir   . Acyclovir And Related   . Benadryl [Diphenhydramine Hcl]     Severe emotional reaction and doesn't work well with Pt. Past history  . Cephalosporins Hives  . Codeine Other (See Comments)    Makes patient feel odd ; "sometimes mild; sometimes severe reaction; mostly severe"  . Cyclobenzaprine Other (See Comments)    Muscle Aches.  . Diclofenac Sodium Nausea And Vomiting  . Dicyclomine   . Hydrocodone   . Hydrocodone-Acetaminophen   . Linzess [Linaclotide] Diarrhea  . Nitrofurantoin Monohyd Macro     Felt funny  . Suboxone [Buprenorphine Hcl-Naloxone Hcl] Nausea And Vomiting  . Tizanidine     Other reaction(s): Other (See Comments) insomnia  . Tramadol   . Baclofen Nausea Only  . Drug Ingredient [Ganciclovir] Rash  . Meloxicam     Other reaction(s): Confusion  . Sulfamethoxazole Rash    ROS Review of Systems  Constitutional: Negative.   HENT: Negative.   Eyes: Negative.   Respiratory: Negative.   Cardiovascular: Negative.   Gastrointestinal: Negative.   Endocrine: Negative.   Genitourinary: Negative.   Musculoskeletal: Negative.   Skin: Negative.   Allergic/Immunologic: Negative.   Neurological: Negative.   Hematological: Negative.   Psychiatric/Behavioral: Negative.  All other systems reviewed and are negative.     Objective:    Physical Exam Vitals and nursing note reviewed.  Constitutional:      General: She is not in acute distress.    Appearance: Normal appearance. She is normal weight. She is not ill-appearing, toxic-appearing or diaphoretic.  Cardiovascular:     Rate and Rhythm: Normal rate and regular rhythm.     Heart sounds: Normal heart sounds. No murmur heard. No friction rub. No gallop.   Pulmonary:     Effort: Pulmonary effort is normal. No respiratory distress.     Breath sounds: Normal breath sounds.  No stridor. No wheezing, rhonchi or rales.  Chest:     Chest wall: No tenderness.  Skin:    General: Skin is warm and dry.  Neurological:     General: No focal deficit present.     Mental Status: She is alert and oriented to person, place, and time. Mental status is at baseline.  Psychiatric:        Mood and Affect: Mood normal.        Behavior: Behavior normal.        Thought Content: Thought content normal.        Judgment: Judgment normal.     BP (!) 144/86   Pulse 70   Temp 98 F (36.7 C) (Temporal)   Resp 18   Ht 5\' 4"  (1.626 m)   Wt 172 lb (78 kg)   SpO2 93%   BMI 29.52 kg/m  Wt Readings from Last 3 Encounters:  03/25/20 172 lb (78 kg)  02/19/20 172 lb 8 oz (78.2 kg)  01/03/20 178 lb 12.8 oz (81.1 kg)     There are no preventive care reminders to display for this patient.  There are no preventive care reminders to display for this patient.  Lab Results  Component Value Date   TSH 4.460 10/04/2019   Lab Results  Component Value Date   WBC 7.8 10/04/2019   HGB 13.9 10/04/2019   HCT 41.5 10/04/2019   MCV 85 10/04/2019   PLT 219 10/04/2019   Lab Results  Component Value Date   NA 142 10/04/2019   K 4.1 10/04/2019   CO2 24 10/04/2019   GLUCOSE 93 10/04/2019   BUN 12 10/04/2019   CREATININE 0.62 10/04/2019   BILITOT 0.4 10/04/2019   ALKPHOS 80 10/04/2019   AST 15 10/04/2019   ALT 17 10/04/2019   PROT 6.3 10/04/2019   ALBUMIN 4.2 10/04/2019   CALCIUM 9.3 10/04/2019   ANIONGAP 11 01/01/2017   GFR 133.89 12/26/2016   Lab Results  Component Value Date   CHOL 180 10/04/2019   Lab Results  Component Value Date   HDL 63 10/04/2019   Lab Results  Component Value Date   LDLCALC 106 (H) 10/04/2019   Lab Results  Component Value Date   TRIG 57 10/04/2019   Lab Results  Component Value Date   CHOLHDL 2.9 10/04/2019   Lab Results  Component Value Date   HGBA1C 6.0 (H) 10/04/2019      Assessment & Plan:   Problem List Items Addressed  This Visit   None   Visit Diagnoses    COVID-19    -  Primary   Relevant Orders   CBC With Differential   Comprehensive metabolic panel      No orders of the defined types were placed in this encounter.   Follow-up: No follow-ups on file.   PLAN  Exam reassuring  Imaging not warranted at this time  Will draw labs to follow up  Return prn  Patient encouraged to call clinic with any questions, comments, or concerns.  I spent 31 minutes with this patient, more than 50% of which was spent counseling and/or educating.  Maximiano Coss, NP

## 2020-03-25 NOTE — Patient Instructions (Signed)
° ° ° °  If you have lab work done today you will be contacted with your lab results within the next 2 weeks.  If you have not heard from us then please contact us. The fastest way to get your results is to register for My Chart. ° ° °IF you received an x-ray today, you will receive an invoice from [facility]. Please contact [facility] at [phone] with questions or concerns regarding your invoice.  ° °IF you received labwork today, you will receive an invoice from [facility]. Please contact [facility] at [phone] with questions or concerns regarding your invoice.  ° °Our billing staff will not be able to assist you with questions regarding bills from these companies. ° °You will be contacted with the lab results as soon as they are available. The fastest way to get your results is to activate your My Chart account. Instructions are located on the last page of this paperwork. If you have not heard from us regarding the results in 2 weeks, please contact this office. °  ° ° ° °

## 2020-03-27 ENCOUNTER — Other Ambulatory Visit: Payer: Self-pay | Admitting: Registered Nurse

## 2020-03-27 DIAGNOSIS — I10 Essential (primary) hypertension: Secondary | ICD-10-CM

## 2020-03-28 NOTE — Telephone Encounter (Signed)
Approved per protocol.  Requested Prescriptions  Pending Prescriptions Disp Refills  . losartan (COZAAR) 100 MG tablet [Pharmacy Med Name: LOSARTAN POTASSIUM 100 MG TAB] 30 tablet 2    Sig: TAKE ONE TABLET BY MOUTH DAILY     Cardiovascular:  Angiotensin Receptor Blockers Failed - 03/27/2020  6:42 PM      Failed - Last BP in normal range    BP Readings from Last 1 Encounters:  03/25/20 (!) 144/86         Passed - Cr in normal range and within 180 days    Creatinine, Ser  Date Value Ref Range Status  03/25/2020 0.68 0.57 - 1.00 mg/dL Final         Passed - K in normal range and within 180 days    Potassium  Date Value Ref Range Status  03/25/2020 4.6 3.5 - 5.2 mmol/L Final         Passed - Patient is not pregnant      Passed - Valid encounter within last 6 months    Recent Outpatient Visits          3 days ago COVID-19   [facility], [place]elf[place], NP   1 week ago Lower respiratory infection   [facility], [place], NP   1 month ago Bacterial respiratory infection   [facility], [place], NP   2 months ago Gastroesophageal reflux disease without esophagitis   [facility], [place], NP   4 months ago Acute non-recurrent maxillary sinusitis   [facility] at [facility], Ines Bloomer, MD

## 2020-05-19 ENCOUNTER — Other Ambulatory Visit: Payer: Self-pay

## 2020-05-19 ENCOUNTER — Encounter: Payer: Self-pay | Admitting: Registered Nurse

## 2020-05-19 ENCOUNTER — Ambulatory Visit (INDEPENDENT_AMBULATORY_CARE_PROVIDER_SITE_OTHER): Payer: 59 | Admitting: Registered Nurse

## 2020-05-19 VITALS — BP 138/77 | HR 74 | Temp 98.0°F | Resp 18 | Ht 64.0 in | Wt 180.0 lb

## 2020-05-19 DIAGNOSIS — J3089 Other allergic rhinitis: Secondary | ICD-10-CM

## 2020-05-19 DIAGNOSIS — H66002 Acute suppurative otitis media without spontaneous rupture of ear drum, left ear: Secondary | ICD-10-CM

## 2020-05-19 DIAGNOSIS — J302 Other seasonal allergic rhinitis: Secondary | ICD-10-CM | POA: Diagnosis not present

## 2020-05-19 MED ORDER — PREDNISONE 20 MG PO TABS: 20.0000 mg | ORAL_TABLET | Freq: Every day | ORAL | 0 refills | Status: DC

## 2020-05-19 MED ORDER — AZITHROMYCIN 250 MG PO TABS: ORAL_TABLET | ORAL | 0 refills | Status: DC

## 2020-05-19 MED ORDER — AZELASTINE HCL 0.1 % NA SOLN
1.0000 | Freq: Two times a day (BID) | NASAL | 6 refills | Status: AC
Start: 2020-05-19 — End: ?

## 2020-05-19 NOTE — Patient Instructions (Signed)
° ° ° °  If you have lab work done today you will be contacted with your lab results within the next 2 weeks.  If you have not heard from us then please contact us. The fastest way to get your results is to register for My Chart. ° ° °IF you received an x-ray today, you will receive an invoice from [facility]. Please contact [facility] at [phone] with questions or concerns regarding your invoice.  ° °IF you received labwork today, you will receive an invoice from [facility]. Please contact [facility] at [phone] with questions or concerns regarding your invoice.  ° °Our billing staff will not be able to assist you with questions regarding bills from these companies. ° °You will be contacted with the lab results as soon as they are available. The fastest way to get your results is to activate your My Chart account. Instructions are located on the last page of this paperwork. If you have not heard from us regarding the results in 2 weeks, please contact this office. °  ° ° ° °

## 2020-05-21 ENCOUNTER — Telehealth: Payer: Self-pay | Admitting: Registered Nurse

## 2020-05-21 NOTE — Telephone Encounter (Signed)
Pt was given zpak on 05-19-2020 for ear pain and would like to know how long would it take for her to feel better

## 2020-05-22 ENCOUNTER — Encounter: Payer: Self-pay | Admitting: Registered Nurse

## 2020-05-22 NOTE — Telephone Encounter (Signed)
Pt called again and is requesting to have a response. She states that she had a spasm in her lungs. Pt states that she would like to speak with someone located in the office. Please advise.

## 2020-05-25 NOTE — Telephone Encounter (Signed)
Patient wants an appointment for tomorrow at 4:30 . Richard morrow is checking to see if this is possible due to dismissal from the practice.

## 2020-05-25 NOTE — Telephone Encounter (Signed)
Pt states that she is still very weak and has no energy. Please advise

## 2020-05-25 NOTE — Telephone Encounter (Signed)
Attempted to return call to this patient to make sure that the patient was starting to feel better.

## 2020-05-26 ENCOUNTER — Ambulatory Visit
Admission: EM | Admit: 2020-05-26 | Discharge: 2020-05-26 | Disposition: A | Discharge status: Home / Self Care | Payer: 59 | Attending: Family Medicine | Admitting: Family Medicine

## 2020-05-26 ENCOUNTER — Other Ambulatory Visit: Payer: Self-pay

## 2020-05-26 DIAGNOSIS — I10 Essential (primary) hypertension: Secondary | ICD-10-CM

## 2020-05-26 DIAGNOSIS — R0600 Dyspnea, unspecified: Secondary | ICD-10-CM

## 2020-05-26 DIAGNOSIS — R0789 Other chest pain: Secondary | ICD-10-CM

## 2020-05-26 NOTE — Telephone Encounter (Signed)
Patient came into the office this afternoon at 4:30pm stating she thought she had an appointment scheduled.  Patient was complaining of tightness in the chest and trouble breathing.    Maximiano Coss spoke with patient advising it as in his medical opinion for her to head to the remote ED at [facility] for evaluation.    Patient understood and stated she would head to the ED.

## 2020-05-26 NOTE — Discharge Instructions (Signed)
Your EKG shows no reason for your symptoms over the last few days.  If chest spasms worsen go to the emergency department. I have placed a referral for you to be evaluated by cardiology as I am concerned your symptoms are possibly related to cardiac and or lungs. Recommend trialing your Xopenex while waiting to be evaluated by cardiology.  Also follow-up with your primary care doctor within the next 7 to 10 days.  Cardiology will contact to schedule an appointment.

## 2020-05-26 NOTE — ED Provider Notes (Signed)
[facility]    CSN: [id number] Arrival date & time: 05/26/20  1802      History   Chief Complaint Chief Complaint  Patient presents with  . Fatigue  . lung spasms    HPI Jasmine [name] is a 65 y.o. female.   HPI Chest spasms x 6 days and hard time breathing when the spasms. Denies any heavy lifting. Spasms are occurring in conjunction with shortness of breath. She was seen by her PCP recently and reports discussing problem and was told to use rescue inhaler. She reports not using rescue inhaler as medication increases her anxiety.  Past Medical History:  Diagnosis Date  . Achilles tendinosis of left lower extremity 01/23/2017  . Anxiety   . Asthma   . Benign paroxysmal positional vertigo 01/05/2016  . Bilateral sensorineural hearing loss 12/22/2014  . Blepharitis of upper and lower eyelids of both eyes 04/27/2016  . Bradycardia 09/22/2014  . Cervicogenic headache 08/24/2016  . Chronic high back pain   . CKD (chronic kidney disease) 12/08/2014  . Cochlear implant in place   . Colon polyp   . Common migraine with intractable migraine 02/19/2020  . Constipation   . Diverticulitis   . Diverticulosis   . DJD (degenerative joint disease)   . Double vision   . Dry eye syndrome of both lacrimal glands 04/27/2016  . Fibromyalgia   . Heart murmur   . High cholesterol   . Hypercalcemia   . Hypercholesterolemia   . Hyperlipidemia 02/01/2016  . Hypertension   . Hypoglycemia   . Insomnia 09/22/2010  . Kidney stone   . Meniscus tear    Right  . Migraine 08/07/2015  . Mild intermittent asthma without complication 53/66/4403  . Nephrolithiasis 03/17/2014  . Nocturnal leg cramps 05/31/2019  . Osteoporosis   . Primary hyperparathyroidism ([place]) 12/10/2014  . PTSD (post-traumatic stress disorder)   . Renal cyst 05/12/2013  . Schizophrenia ([place])    treated by Dr. Casimiro Needle  . Schizophrenia ([place])   . Seizures ([place])    last seizure 3 years ago, not on AED  . Stroke  ([place]) 07/08/11   "I've had mini strokes before; left side of face  is more down than right "  . Syncope and collapse    One episode - 07/10/15  . TIA (transient ischemic attack)     Patient Active Problem List   Diagnosis Date Noted  . Common migraine with intractable migraine 02/19/2020  . Nocturnal leg cramps 05/31/2019  . Anorgasmia of female 05/22/2018  . Abnormal weight loss 05/22/2018  . Degeneration of spine 05/22/2018  . Depressive disorder 05/22/2018  . Diverticulitis 05/22/2018  . Achilles tendinosis of left lower extremity 01/23/2017  . Cervicogenic headache 08/24/2016  . Pain of toe of left foot 05/19/2016  . Left shoulder pain 05/11/2016  . Dry eye syndrome of both lacrimal glands 04/27/2016  . Hyperlipidemia 02/01/2016  . Benign paroxysmal positional vertigo 01/05/2016  . Fibromyalgia 10/31/2015  . Migraine 08/07/2015  . Neck pain 08/07/2015  . Myalgia and myositis 03/04/2015  . Bilateral sensorineural hearing loss 12/22/2014  . DJD (degenerative joint disease) of knee 12/09/2014  . Essential hypertension 12/08/2014  . Bradycardia 09/22/2014  . Hypercalcemia 09/22/2014  . Osteoporosis 12/23/2013  . DJD (degenerative joint disease) 08/19/2013  . Renal cyst 05/12/2013  . Obesity 09/19/2012  . Chronic low back pain 11/30/2011  . Schizophrenia ([place]) 07/14/2011  . Insomnia 09/22/2010  . Perennial allergic rhinitis with seasonal variation 09/22/2010  .  Mild intermittent asthma without complication 16/11/9602  . Hypercholesteremia 09/22/2010    Past Surgical History:  Procedure Laterality Date  . CATARACT EXTRACTION    . COCHLEAR IMPLANT  2010   left  . DILATION AND CURETTAGE OF UTERUS    . HERNIA REPAIR    . [place]   left  . KIDNEY STONE SURGERY  ~ 2006  . PARATHYROIDECTOMY    . skin cancer removal    . TONSILLECTOMY     "as a child"  . tooth removal    . WISDOM TOOTH EXTRACTION      OB History   No obstetric history on  file.      Home Medications    Prior to Admission medications   Medication Sig Start Date End Date Taking? Authorizing Provider  acetaminophen (TYLENOL) 500 MG tablet Take 500 mg by mouth every 8 (eight) hours as needed for moderate pain.    [provider]  AIMOVIG 140 MG/ML SOAJ INJECT 140MG  UNDER THE SKIN EVERY 30 DAYS 12/03/19   Kathrynn Ducking, MD  albuterol (PROVENTIL) (2.5 MG/3ML) 0.083% nebulizer solution Take 3 mLs (2.5 mg total) by nebulization every 6 (six) hours as needed for wheezing or shortness of breath. 02/27/20   Just, Laurita Quint, FNP  albuterol (VENTOLIN HFA) 108 (90 Base) MCG/ACT inhaler Inhale 2 puffs into the lungs every 6 (six) hours as needed for wheezing or shortness of breath. 02/27/20   Just, Laurita Quint, FNP  amoxicillin-clavulanate (AUGMENTIN) 875-125 MG tablet Take 1 tablet by mouth 2 (two) times daily. 03/18/20   Maximiano Coss, NP  aspirin 81 MG EC tablet Take 81 mg by mouth daily.    [provider]  azelastine (ASTELIN) 0.1 % nasal spray Place 1 spray into both nostrils 2 (two) times daily. Use in each nostril as directed 05/19/20   Maximiano Coss, NP  B COMPLEX VITAMINS PO Take by mouth.    [provider]  clonazePAM (KLONOPIN) 1 MG tablet SMARTSIG:3 Tablet(s) By Mouth Every Other Day PRN 04/08/19   [provider]  cloNIDine (CATAPRES) 0.1 MG tablet Take 1 tablet (0.1 mg total) by mouth as needed. Use as needed for blood pressure greater than 160/100 02/13/19   Daune Perch, NP  DULoxetine (CYMBALTA) 30 MG capsule One capsule daily for 2 weeks, then take one twice a day 02/19/20   Kathrynn Ducking, MD  felodipine (PLENDIL) 10 MG 24 hr tablet Take 1 tablet (10 mg total) by mouth daily. 01/03/20   Maximiano Coss, NP  fluticasone Asencion Islam) 50 MCG/ACT nasal spray USE TWO SPRAY(S) IN EACH NOSTRIL ONCE DAILY 02/27/20   Maximiano Coss, NP  Fluticasone-Salmeterol (ADVAIR) 100-50 MCG/DOSE AEPB     [provider]   gabapentin (NEURONTIN) 800 MG tablet Take 1 tablet (800 mg total) by mouth 3 (three) times daily. 06/13/19   Kathrynn Ducking, MD  LACTOBACILLUS RHAMNOSUS, GG, PO Take by mouth.     [provider]  levalbuterol Penne Lash HFA) 45 MCG/ACT inhaler Inhale 2 puffs into the lungs every 8 (eight) hours as needed for wheezing. 02/27/20   Just, Laurita Quint, FNP  lidocaine (XYLOCAINE) 5 % ointment Apply 1 application topically as needed. 01/03/20   Maximiano Coss, NP  losartan (COZAAR) 100 MG tablet TAKE ONE TABLET BY MOUTH DAILY 03/28/20   Maximiano Coss, NP  metoprolol succinate (TOPROL-XL) 25 MG 24 hr tablet Take 1 tablet (25 mg total) by mouth daily. Take 25 mg by mouth daily.  07/15/19   Maximiano Coss, NP  Multiple Vitamin (MULTIVITAMIN PO) Take by mouth.    [provider]  omeprazole (PRILOSEC) 40 MG capsule Take 1 capsule (40 mg total) by mouth daily. 01/03/20   Maximiano Coss, NP  orphenadrine (NORFLEX) 100 MG tablet Take 1 tablet (100 mg total) by mouth 2 (two) times daily as needed for muscle spasms. 10/04/19   Maximiano Coss, NP  predniSONE (DELTASONE) 20 MG tablet Take 1 tablet (20 mg total) by mouth daily with breakfast. 05/19/20   Maximiano Coss, NP  psyllium (METAMUCIL) 58.6 % packet Take 1 packet by mouth daily.    [provider]  rosuvastatin (CRESTOR) 20 MG tablet TAKE ONE TABLET BY MOUTH EVERY EVENING 01/28/20   Maximiano Coss, NP  temazepam (RESTORIL) 15 MG capsule     [provider]  zolpidem (AMBIEN) 10 MG tablet Take 2 tablets by mouth at bedtime.  04/06/16   [provider]    Family History Family History  Problem Relation Age of Onset  . Heart attack Father 71       Living  . Arthritis Father   . Skin cancer Father   . Hypertension Father   . Hyperlipidemia Father   . Leukemia Mother 13       Deceased  . Alzheimer's disease Paternal Aunt        X2  . Stomach cancer Paternal Aunt        x1  . Arthritis/Rheumatoid Paternal Aunt         x1  . Neuropathy Brother        Peripheal  . Fibromyalgia Sister   . Neuropathy Sister   . HIV Sister   . Fibromyalgia Sister   . Neuropathy Sister   . Arthritis/Rheumatoid Sister   . Neuropathy Sister   . Diabetes Paternal Grandmother   . Thyroid disease Sister   . Neuropathy Sister     Social History Social History   Tobacco Use  . Smoking status: Former Smoker    Packs/day: 1.00    Years: 3.00    Pack years: 3.00    Types: Cigarettes    Quit date: 02/29/2004    Years since quitting: 16.2  . Smokeless tobacco: Never Used  Vaping Use  . Vaping Use: Never used  Substance Use Topics  . Alcohol use: Yes    Alcohol/week: 0.0 standard drinks    Comment: "glass of wine q once in awhile; not very often; do it on special occasion"  . Drug use: No     Allergies   Amlodipine, Diclofenac, Sulfa antibiotics, Famciclovir, Metronidazole, Soma [carisoprodol], Acyclovir, Acyclovir and related, Benadryl [diphenhydramine hcl], Cephalosporins, Codeine, Cyclobenzaprine, Diclofenac sodium, Dicyclomine, Hydrocodone, Hydrocodone-acetaminophen, Linzess [linaclotide], Nitrofurantoin monohyd macro, Suboxone [buprenorphine hcl-naloxone hcl], Tizanidine, Tramadol, Baclofen, Drug ingredient [ganciclovir], Meloxicam, and Sulfamethoxazole   Review of Systems Review of Systems Pertinent negatives listed in HPI  Physical Exam Triage Vital Signs ED Triage Vitals [05/26/20 1948]  Enc Vitals Group     BP (!) 165/84     Pulse Rate 69     Resp 18     Temp 98.8 F (37.1 C)     Temp Source Oral     SpO2 93 %     Weight      Height      Head Circumference      Peak Flow      Pain Score 0     Pain Loc      Pain Edu?  Excl. in [place]?    No data found.  Updated Vital Signs BP (!) 165/84 (BP Location: Left Arm)   Pulse 69   Temp 98.8 F (37.1 C) (Oral)   Resp 18   SpO2 93%   Visual Acuity Right Eye Distance:   Left Eye Distance:   Bilateral Distance:    Right Eye Near:    Left Eye Near:    Bilateral Near:     Physical Exam Constitutional:      General: She is not in acute distress.    Appearance: She is not ill-appearing.  HENT:     Head: Normocephalic.     Right Ear: Hearing normal.     Left Ear: Hearing normal.     Nose: Nose normal.     Mouth/Throat:     Lips: Pink.     Mouth: Mucous membranes are moist.     Pharynx: Oropharynx is clear.  Cardiovascular:     Rate and Rhythm: Normal rate and regular rhythm.     Pulses:          Radial pulses are 2+ on the right side and 2+ on the left side.     Heart sounds: Normal heart sounds, S1 normal and S2 normal.  Pulmonary:     Effort: Pulmonary effort is normal.     Breath sounds: Normal air entry. Decreased breath sounds present. No wheezing, rhonchi or rales.  Musculoskeletal:     Right lower leg: No edema.     Left lower leg: No edema.  Neurological:     Mental Status: She is alert.     GCS: GCS eye subscore is 4. GCS verbal subscore is 5. GCS motor subscore is 6.     Cranial Nerves: Cranial nerves are intact.  Psychiatric:        Mood and Affect: Mood is anxious.        Speech: Speech normal.        Behavior: Behavior normal.        Thought Content: Thought content normal.      UC Treatments / Results  Labs (all labs ordered are listed, but only abnormal results are displayed) Labs Reviewed - No data to display  EKG Ordered Verbal order, not scanned ECG NSR 69 BPM, non specific T wave changes, no acute ST changes  Radiology No results found.  Procedures Procedures (including critical care time)  Medications Ordered in UC Medications - No data to display  Initial Impression / Assessment and Plan / UC Course  I have reviewed the triage vital signs and the nursing notes.  Pertinent labs & imaging results that were available during my care of the patient were reviewed by me and considered in my medical decision making (see chart for details).     Atypical chest pain with  dyspnea ongoing for > two weeks. Cardiac vs pulmonary etiology. No acute changes of ECG today. VSS with the exception BP slightly elevated per two readings, other patient doesn't present with any emergent condition warranting immediate evaluation. Encouraged use of rescue inhaler, placed urgent cardiology referral , schedule follow with PCP within 7-10 days , ER precautions given if symptoms worsen or do not improve  Final Clinical Impressions(s) / UC Diagnoses   Final diagnoses:  Atypical chest pain  Dyspnea, unspecified type  Accelerated hypertension     Discharge Instructions     Your EKG shows no reason for your symptoms over the last few days.  If chest spasms worsen  go to the emergency department. I have placed a referral for you to be evaluated by cardiology as I am concerned your symptoms are possibly related to cardiac and or lungs. Recommend trialing your Xopenex while waiting to be evaluated by cardiology.  Also follow-up with your primary care doctor within the next 7 to 10 days.  Cardiology will contact to schedule an appointment.    ED Prescriptions    None     PDMP not reviewed this encounter.   Scot Jun, FNP 05/31/20 [phone]

## 2020-05-26 NOTE — ED Triage Notes (Signed)
Pt states saw her PCP on 03/22 for fatigue and lund spasms. States given antibiotics and prednisone with no relief. Denies SOB states just hard to breath during the spasms.

## 2020-05-27 ENCOUNTER — Emergency Department (HOSPITAL_BASED_OUTPATIENT_CLINIC_OR_DEPARTMENT_OTHER): Payer: 59

## 2020-05-27 ENCOUNTER — Other Ambulatory Visit: Payer: Self-pay

## 2020-05-27 ENCOUNTER — Encounter (HOSPITAL_BASED_OUTPATIENT_CLINIC_OR_DEPARTMENT_OTHER): Payer: Self-pay | Admitting: Emergency Medicine

## 2020-05-27 ENCOUNTER — Emergency Department (HOSPITAL_BASED_OUTPATIENT_CLINIC_OR_DEPARTMENT_OTHER)
Admission: EM | Admit: 2020-05-27 | Discharge: 2020-05-27 | Disposition: A | Discharge status: Home / Self Care | Payer: 59 | Attending: Emergency Medicine | Admitting: Emergency Medicine

## 2020-05-27 DIAGNOSIS — Z87891 Personal history of nicotine dependence: Secondary | ICD-10-CM | POA: Insufficient documentation

## 2020-05-27 DIAGNOSIS — J4520 Mild intermittent asthma, uncomplicated: Secondary | ICD-10-CM | POA: Insufficient documentation

## 2020-05-27 DIAGNOSIS — G839 Paralytic syndrome, unspecified: Secondary | ICD-10-CM | POA: Insufficient documentation

## 2020-05-27 DIAGNOSIS — Z79899 Other long term (current) drug therapy: Secondary | ICD-10-CM | POA: Insufficient documentation

## 2020-05-27 DIAGNOSIS — R079 Chest pain, unspecified: Secondary | ICD-10-CM | POA: Insufficient documentation

## 2020-05-27 DIAGNOSIS — R531 Weakness: Secondary | ICD-10-CM

## 2020-05-27 DIAGNOSIS — N189 Chronic kidney disease, unspecified: Secondary | ICD-10-CM | POA: Insufficient documentation

## 2020-05-27 DIAGNOSIS — Z7982 Long term (current) use of aspirin: Secondary | ICD-10-CM | POA: Insufficient documentation

## 2020-05-27 DIAGNOSIS — Z20822 Contact with and (suspected) exposure to covid-19: Secondary | ICD-10-CM | POA: Insufficient documentation

## 2020-05-27 DIAGNOSIS — I129 Hypertensive chronic kidney disease with stage 1 through stage 4 chronic kidney disease, or unspecified chronic kidney disease: Secondary | ICD-10-CM | POA: Diagnosis not present

## 2020-05-27 LAB — CBC WITH DIFFERENTIAL/PLATELET
Abs Immature Granulocytes: 0.05 10*3/uL (ref 0.00–0.07)
Basophils Absolute: 0.1 10*3/uL (ref 0.0–0.1)
Basophils Relative: 1 %
Eosinophils Absolute: 0.2 10*3/uL (ref 0.0–0.5)
Eosinophils Relative: 2 %
HCT: 41.1 % (ref 36.0–46.0)
Hemoglobin: 13.6 g/dL (ref 12.0–15.0)
Immature Granulocytes: 1 %
Lymphocytes Relative: 39 %
Lymphs Abs: 4 10*3/uL (ref 0.7–4.0)
MCH: 29.4 pg (ref 26.0–34.0)
MCHC: 33.1 g/dL (ref 30.0–36.0)
MCV: 88.8 fL (ref 80.0–100.0)
Monocytes Absolute: 0.5 10*3/uL (ref 0.1–1.0)
Monocytes Relative: 5 %
Neutro Abs: 5.5 10*3/uL (ref 1.7–7.7)
Neutrophils Relative %: 52 %
Platelets: 249 10*3/uL (ref 150–400)
RBC: 4.63 MIL/uL (ref 3.87–5.11)
RDW: 13.9 % (ref 11.5–15.5)
WBC: 10.3 10*3/uL (ref 4.0–10.5)
nRBC: 0 % (ref 0.0–0.2)

## 2020-05-27 LAB — URINALYSIS, ROUTINE W REFLEX MICROSCOPIC
Bilirubin Urine: NEGATIVE
Glucose, UA: NEGATIVE mg/dL
Hgb urine dipstick: NEGATIVE
Ketones, ur: NEGATIVE mg/dL
Leukocytes,Ua: NEGATIVE
Nitrite: NEGATIVE
Protein, ur: NEGATIVE mg/dL
Specific Gravity, Urine: 1.005 (ref 1.005–1.030)
pH: 7 (ref 5.0–8.0)

## 2020-05-27 LAB — BASIC METABOLIC PANEL
Anion gap: 9 (ref 5–15)
BUN: 18 mg/dL (ref 8–23)
CO2: 25 mmol/L (ref 22–32)
Calcium: 9.3 mg/dL (ref 8.9–10.3)
Chloride: 108 mmol/L (ref 98–111)
Creatinine, Ser: 0.56 mg/dL (ref 0.44–1.00)
GFR, Estimated: >60 mL/min (ref >60)
Glucose, Bld: 101 mg/dL — ABNORMAL HIGH (ref 70–99)
Potassium: 3.7 mmol/L (ref 3.5–5.1)
Sodium: 142 mmol/L (ref 135–145)

## 2020-05-27 LAB — RESP PANEL BY RT-PCR (FLU A&B, COVID) ARPGX2
Influenza A by PCR: NEGATIVE
Influenza B by PCR: NEGATIVE
SARS Coronavirus 2 by RT PCR: NEGATIVE

## 2020-05-27 LAB — I-STAT VENOUS BLOOD GAS, ED
Acid-Base Excess: 1 mmol/L (ref 0.0–2.0)
Bicarbonate: 26.8 mmol/L (ref 20.0–28.0)
Calcium, Ion: 1.28 mmol/L (ref 1.15–1.40)
HCT: 40 % (ref 36.0–46.0)
Hemoglobin: 13.6 g/dL (ref 12.0–15.0)
O2 Saturation: 70 %
Patient temperature: 98.4
Potassium: 3.7 mmol/L (ref 3.5–5.1)
Sodium: 143 mmol/L (ref 135–145)
TCO2: 28 mmol/L (ref 22–32)
pCO2, Ven: 46.5 mmHg (ref 44.0–60.0)
pH, Ven: 7.368 (ref 7.250–7.430)
pO2, Ven: 38 mmHg (ref 32.0–45.0)

## 2020-05-27 LAB — CK: Total CK: 57 U/L (ref 38–234)

## 2020-05-27 LAB — ETHANOL: Alcohol, Ethyl (B): 12 mg/dL — ABNORMAL HIGH (ref <10)

## 2020-05-27 LAB — TROPONIN I (HIGH SENSITIVITY)
Troponin I (High Sensitivity): 4 ng/L (ref <18)
Troponin I (High Sensitivity): 7 ng/L (ref <18)

## 2020-05-27 LAB — MAGNESIUM: Magnesium: 2 mg/dL (ref 1.7–2.4)

## 2020-05-27 MED ORDER — SODIUM CHLORIDE 0.9 % IV BOLUS
1000.0000 mL | Freq: Once | INTRAVENOUS | Status: AC
  Administered 2020-05-27: 1000 mL via INTRAVENOUS

## 2020-05-27 NOTE — Discharge Instructions (Addendum)
Your cardiac tests were normal today.  The CT scan of your brain was also normal with no acute findings.  Follow-up with your neurologist within 3 to 4 days.  Return immediately if you have worsening symptoms trouble breathing fevers or any additional concerns.

## 2020-05-27 NOTE — ED Triage Notes (Signed)
Lightheaded, chest pain x 2 weeks. , weakness to all extremities. alert and oriented x 4. denies headache nor vision changes.

## 2020-05-27 NOTE — Progress Notes (Signed)
VBG obtained and ran per MD order.

## 2020-05-27 NOTE — ED Provider Notes (Signed)
[facility] EMERGENCY DEPT Provider Note   CSN: [id number] Arrival date & time: 05/27/20  1804     History Chief Complaint  Patient presents with  . Weakness    Jasmine [name] is a 65 y.o. female.  Patient presents ER chief complaint of generalized fatigue and weakness as well as sensation of overall paralysis and left-sided chest pain.  She states that the weakness has been going on and off for the past 2 weeks or so.  She had a significant episode today several hours prior to arrival.  She states that she has had left-sided chest pain on for a few days as well.  Describes a sharp and aching worse when she pushes on her left chest.  She states that her son came over and was asking about her chest pain and when he pushed on her left chest it became more severe and at that point she decided to come to the ER.  She denies any fevers or cough or vomiting or diarrhea.  She states that she had intermittent episodes of a sensation of paralysis for which she follows up with a neurologist, but has not had an episode as severe as she had earlier today.        Past Medical History:  Diagnosis Date  . Achilles tendinosis of left lower extremity 01/23/2017  . Anxiety   . Asthma   . Benign paroxysmal positional vertigo 01/05/2016  . Bilateral sensorineural hearing loss 12/22/2014  . Blepharitis of upper and lower eyelids of both eyes 04/27/2016  . Bradycardia 09/22/2014  . Cervicogenic headache 08/24/2016  . Chronic high back pain   . CKD (chronic kidney disease) 12/08/2014  . Cochlear implant in place   . Colon polyp   . Common migraine with intractable migraine 02/19/2020  . Constipation   . Diverticulitis   . Diverticulosis   . DJD (degenerative joint disease)   . Double vision   . Dry eye syndrome of both lacrimal glands 04/27/2016  . Fibromyalgia   . Heart murmur   . High cholesterol   . Hypercalcemia   . Hypercholesterolemia   . Hyperlipidemia 02/01/2016  .  Hypertension   . Hypoglycemia   . Insomnia 09/22/2010  . Kidney stone   . Meniscus tear    Right  . Migraine 08/07/2015  . Mild intermittent asthma without complication 48/18/5631  . Nephrolithiasis 03/17/2014  . Nocturnal leg cramps 05/31/2019  . Osteoporosis   . Primary hyperparathyroidism ([place]) 12/10/2014  . PTSD (post-traumatic stress disorder)   . Renal cyst 05/12/2013  . Schizophrenia ([place])    treated by Dr. Casimiro Needle  . Schizophrenia ([place])   . Seizures ([place])    last seizure 3 years ago, not on AED  . Stroke ([place]) 07/08/11   "I've had mini strokes before; left side of face  is more down than right "  . Syncope and collapse    One episode - 07/10/15  . TIA (transient ischemic attack)     Patient Active Problem List   Diagnosis Date Noted  . Common migraine with intractable migraine 02/19/2020  . Nocturnal leg cramps 05/31/2019  . Anorgasmia of female 05/22/2018  . Abnormal weight loss 05/22/2018  . Degeneration of spine 05/22/2018  . Depressive disorder 05/22/2018  . Diverticulitis 05/22/2018  . Achilles tendinosis of left lower extremity 01/23/2017  . Cervicogenic headache 08/24/2016  . Pain of toe of left foot 05/19/2016  . Left shoulder pain 05/11/2016  . Dry eye syndrome of both  lacrimal glands 04/27/2016  . Hyperlipidemia 02/01/2016  . Benign paroxysmal positional vertigo 01/05/2016  . Fibromyalgia 10/31/2015  . Migraine 08/07/2015  . Neck pain 08/07/2015  . Myalgia and myositis 03/04/2015  . Bilateral sensorineural hearing loss 12/22/2014  . DJD (degenerative joint disease) of knee 12/09/2014  . Essential hypertension 12/08/2014  . Bradycardia 09/22/2014  . Hypercalcemia 09/22/2014  . Osteoporosis 12/23/2013  . DJD (degenerative joint disease) 08/19/2013  . Renal cyst 05/12/2013  . Obesity 09/19/2012  . Chronic low back pain 11/30/2011  . Schizophrenia ([place]) 07/14/2011  . Insomnia 09/22/2010  . Perennial allergic rhinitis with seasonal variation 09/22/2010   . Mild intermittent asthma without complication 76/28/3151  . Hypercholesteremia 09/22/2010    Past Surgical History:  Procedure Laterality Date  . CATARACT EXTRACTION    . COCHLEAR IMPLANT  2010   left  . DILATION AND CURETTAGE OF UTERUS    . HERNIA REPAIR    . [place]   left  . KIDNEY STONE SURGERY  ~ 2006  . PARATHYROIDECTOMY    . skin cancer removal    . TONSILLECTOMY     "as a child"  . tooth removal    . WISDOM TOOTH EXTRACTION       OB History   No obstetric history on file.     Family History  Problem Relation Age of Onset  . Heart attack Father 70       Living  . Arthritis Father   . Skin cancer Father   . Hypertension Father   . Hyperlipidemia Father   . Leukemia Mother 21       Deceased  . Alzheimer's disease Paternal Aunt        X2  . Stomach cancer Paternal Aunt        x1  . Arthritis/Rheumatoid Paternal Aunt        x1  . Neuropathy Brother        Peripheal  . Fibromyalgia Sister   . Neuropathy Sister   . HIV Sister   . Fibromyalgia Sister   . Neuropathy Sister   . Arthritis/Rheumatoid Sister   . Neuropathy Sister   . Diabetes Paternal Grandmother   . Thyroid disease Sister   . Neuropathy Sister     Social History   Tobacco Use  . Smoking status: Former Smoker    Packs/day: 1.00    Years: 3.00    Pack years: 3.00    Types: Cigarettes    Quit date: 02/29/2004    Years since quitting: 16.2  . Smokeless tobacco: Never Used  Vaping Use  . Vaping Use: Never used  Substance Use Topics  . Alcohol use: Yes    Alcohol/week: 0.0 standard drinks    Comment: "glass of wine q once in awhile; not very often; do it on special occasion"  . Drug use: No    Home Medications Prior to Admission medications   Medication Sig Start Date End Date Taking? Authorizing Provider  acetaminophen (TYLENOL) 500 MG tablet Take 500 mg by mouth every 8 (eight) hours as needed for moderate pain.    [provider]  AIMOVIG 140  MG/ML SOAJ INJECT 140MG  UNDER THE SKIN EVERY 30 DAYS 12/03/19   Kathrynn Ducking, MD  albuterol (PROVENTIL) (2.5 MG/3ML) 0.083% nebulizer solution Take 3 mLs (2.5 mg total) by nebulization every 6 (six) hours as needed for wheezing or shortness of breath. 02/27/20   Just, Laurita Quint, FNP  albuterol (VENTOLIN HFA) 108 (  90 Base) MCG/ACT inhaler Inhale 2 puffs into the lungs every 6 (six) hours as needed for wheezing or shortness of breath. 02/27/20   Just, Laurita Quint, FNP  amoxicillin-clavulanate (AUGMENTIN) 875-125 MG tablet Take 1 tablet by mouth 2 (two) times daily. 03/18/20   Maximiano Coss, NP  aspirin 81 MG EC tablet Take 81 mg by mouth daily.    [provider]  azelastine (ASTELIN) 0.1 % nasal spray Place 1 spray into both nostrils 2 (two) times daily. Use in each nostril as directed 05/19/20   Maximiano Coss, NP  B COMPLEX VITAMINS PO Take by mouth.    [provider]  clonazePAM (KLONOPIN) 1 MG tablet SMARTSIG:3 Tablet(s) By Mouth Every Other Day PRN 04/08/19   [provider]  cloNIDine (CATAPRES) 0.1 MG tablet Take 1 tablet (0.1 mg total) by mouth as needed. Use as needed for blood pressure greater than 160/100 02/13/19   Daune Perch, NP  DULoxetine (CYMBALTA) 30 MG capsule One capsule daily for 2 weeks, then take one twice a day 02/19/20   Kathrynn Ducking, MD  felodipine (PLENDIL) 10 MG 24 hr tablet Take 1 tablet (10 mg total) by mouth daily. 01/03/20   Maximiano Coss, NP  fluticasone Asencion Islam) 50 MCG/ACT nasal spray USE TWO SPRAY(S) IN EACH NOSTRIL ONCE DAILY 02/27/20   Maximiano Coss, NP  Fluticasone-Salmeterol (ADVAIR) 100-50 MCG/DOSE AEPB     [provider]  gabapentin (NEURONTIN) 800 MG tablet Take 1 tablet (800 mg total) by mouth 3 (three) times daily. 06/13/19   Kathrynn Ducking, MD  LACTOBACILLUS RHAMNOSUS, GG, PO Take by mouth.     [provider]  levalbuterol Penne Lash HFA) 45 MCG/ACT inhaler Inhale 2 puffs into the lungs every 8  (eight) hours as needed for wheezing. 02/27/20   Just, Laurita Quint, FNP  lidocaine (XYLOCAINE) 5 % ointment Apply 1 application topically as needed. 01/03/20   Maximiano Coss, NP  losartan (COZAAR) 100 MG tablet TAKE ONE TABLET BY MOUTH DAILY 03/28/20   Maximiano Coss, NP  metoprolol succinate (TOPROL-XL) 25 MG 24 hr tablet Take 1 tablet (25 mg total) by mouth daily. Take 25 mg by mouth daily. 07/15/19   Maximiano Coss, NP  Multiple Vitamin (MULTIVITAMIN PO) Take by mouth.    [provider]  omeprazole (PRILOSEC) 40 MG capsule Take 1 capsule (40 mg total) by mouth daily. 01/03/20   Maximiano Coss, NP  orphenadrine (NORFLEX) 100 MG tablet Take 1 tablet (100 mg total) by mouth 2 (two) times daily as needed for muscle spasms. 10/04/19   Maximiano Coss, NP  predniSONE (DELTASONE) 20 MG tablet Take 1 tablet (20 mg total) by mouth daily with breakfast. 05/19/20   Maximiano Coss, NP  psyllium (METAMUCIL) 58.6 % packet Take 1 packet by mouth daily.    [provider]  rosuvastatin (CRESTOR) 20 MG tablet TAKE ONE TABLET BY MOUTH EVERY EVENING 01/28/20   Maximiano Coss, NP  temazepam (RESTORIL) 15 MG capsule     [provider]  zolpidem (AMBIEN) 10 MG tablet Take 2 tablets by mouth at bedtime.  04/06/16   [provider]    Allergies    Amlodipine, Diclofenac, Sulfa antibiotics, Famciclovir, Metronidazole, Soma [carisoprodol], Acyclovir, Acyclovir and related, Benadryl [diphenhydramine hcl], Cephalosporins, Codeine, Cyclobenzaprine, Diclofenac sodium, Dicyclomine, Hydrocodone, Hydrocodone-acetaminophen, Linzess [linaclotide], Nitrofurantoin monohyd macro, Suboxone [buprenorphine hcl-naloxone hcl], Tizanidine, Tramadol, Baclofen, Drug ingredient [ganciclovir], Meloxicam, and Sulfamethoxazole  Review of Systems   Review of Systems  Constitutional: Negative for fever.  HENT:  Negative for ear pain.   Eyes: Negative for pain.  Respiratory: Negative for cough.    Cardiovascular: Positive for chest pain.  Gastrointestinal: Negative for abdominal pain.  Genitourinary: Negative for flank pain.  Musculoskeletal: Negative for back pain.  Skin: Negative for rash.  Neurological: Negative for headaches.    Physical Exam Updated Vital Signs BP 140/60   Pulse (!) 59   Temp 98.4 F (36.9 C) (Oral)   Resp 13   Ht 5\' 4"  (1.626 m)   Wt 82 kg   SpO2 98%   BMI 31.03 kg/m   Physical Exam Constitutional:      General: She is not in acute distress.    Appearance: Normal appearance.  HENT:     Head: Normocephalic.     Nose: Nose normal.  Eyes:     Extraocular Movements: Extraocular movements intact.  Cardiovascular:     Rate and Rhythm: Normal rate.  Pulmonary:     Effort: Pulmonary effort is normal.  Musculoskeletal:        General: Normal range of motion.     Cervical back: Normal range of motion.     Comments: Palpation of left chest wall reproduces patient's chest pain.  Neurological:     General: No focal deficit present.     Mental Status: She is alert and oriented to person, place, and time. Mental status is at baseline.     Cranial Nerves: No cranial nerve deficit.     Sensory: No sensory deficit.     Motor: No weakness.     Comments: 5/5 strength all extremities.  Cranial nerves II through XII intact.     ED Results / Procedures / Treatments   Labs (all labs ordered are listed, but only abnormal results are displayed) Labs Reviewed  BASIC METABOLIC PANEL - Abnormal; Notable for the following components:      Result Value   Glucose, Bld 101 (*)    All other components within normal limits  ETHANOL - Abnormal; Notable for the following components:   Alcohol, Ethyl (B) 12 (*)    All other components within normal limits  URINALYSIS, ROUTINE W REFLEX MICROSCOPIC - Abnormal; Notable for the following components:   Color, Urine COLORLESS (*)    All other components within normal limits  RESP PANEL BY RT-PCR (FLU A&B, COVID)  ARPGX2  CBC WITH DIFFERENTIAL/PLATELET  MAGNESIUM  CK  I-STAT VENOUS BLOOD GAS, ED  TROPONIN I (HIGH SENSITIVITY)  TROPONIN I (HIGH SENSITIVITY)    EKG EKG Interpretation  Date/Time:  Wednesday May 27 2020 18:14:57 EDT Ventricular Rate:  85 PR Interval:  153 QRS Duration: 94 QT Interval:  385 QTC Calculation: 458 R Axis:   69 Text Interpretation: Sinus rhythm Borderline repolarization abnormality Confirmed by Thamas Jaegers ([id number]) on 05/27/2020 6:17:48 PM   Radiology CT Head Wo Contrast  Result Date: 05/27/2020 CLINICAL DATA:  Mental status change EXAM: CT HEAD WITHOUT CONTRAST TECHNIQUE: Contiguous axial images were obtained from the base of the skull through the vertex without intravenous contrast. COMPARISON:  None CT brain 02/13/2019 FINDINGS: Brain: Extensive artifact from left cochlear implant obscures portions of the left hemisphere. Allowing for this, no gross acute territorial infarct, hemorrhage or intracranial mass is seen. The ventricles are nonenlarged. Vascular: No hyperdense vessels.  Carotid vascular calcification. Skull: No fracture Sinuses/Orbits: Fluid in the left mastoid air cells. Other: None IMPRESSION: 1. Extensive artifact from left cochlear implant obscures portions of the left hemisphere. Allowing for this, no definite CT  evidence for acute intracranial abnormality. 2. Fluid in the left mastoid air cells. Electronically Signed   By: Donavan Foil M.D.   On: 05/27/2020 21:08   DG Chest Port 1 View  Result Date: 05/27/2020 CLINICAL DATA:  Chest pain and shortness of breath EXAM: PORTABLE CHEST 1 VIEW COMPARISON:  02/13/2019 FINDINGS: Cardiac shadow is within normal limits. The lungs are clear bilaterally. No focal infiltrate is noted. No bony abnormality is seen. IMPRESSION: No active disease. Electronically Signed   By: Inez Catalina M.D.   On: 05/27/2020 19:54    Procedures Procedures   Medications Ordered in ED Medications  sodium chloride 0.9 % bolus  1,000 mL (0 mLs Intravenous Stopped 05/27/20 2017)    ED Course  I have reviewed the triage vital signs and the nursing notes.  Pertinent labs & imaging results that were available during my care of the patient were reviewed by me and considered in my medical decision making (see chart for details).    MDM Rules/Calculators/A&P                          EKG shows sinus rhythm, no ST elevations depressions noted, normal rate.  Labs otherwise unremarkable troponin is negative x2.  Son was concerned that the patient appeared more fatigued and tired than normal and requesting a CT scan of the head.  She has no focal weakness at this time, CT scan is unremarkable acute findings.  Patient remains neuro intact with no focal neuro deficit.  Advising her to follow-up with her neurologist within the week.  Advised immediate return for worsening symptoms fevers pain or any additional concerns.  Final Clinical Impression(s) / ED Diagnoses Final diagnoses:  Left-sided chest pain  Weakness    Rx / DC Orders ED Discharge Orders    None       Luna Fuse, MD 05/27/20 2117

## 2020-05-29 ENCOUNTER — Telehealth: Payer: Self-pay | Admitting: Neurology

## 2020-05-29 NOTE — Telephone Encounter (Signed)
Pt called, I went to the ER on Wednesday, was told by the attending physician to schedule an appt with my neurologist. Would like a call from the nurse to discuss a sooner appt than October.

## 2020-06-04 ENCOUNTER — Other Ambulatory Visit: Payer: Self-pay | Admitting: Neurology

## 2020-06-04 ENCOUNTER — Other Ambulatory Visit: Payer: Self-pay

## 2020-06-04 ENCOUNTER — Ambulatory Visit (INDEPENDENT_AMBULATORY_CARE_PROVIDER_SITE_OTHER): Payer: 59 | Admitting: Registered Nurse

## 2020-06-04 VITALS — BP 138/68 | Temp 98.3°F | Resp 16 | Ht 64.0 in | Wt 180.4 lb

## 2020-06-04 DIAGNOSIS — Z09 Encounter for follow-up examination after completed treatment for conditions other than malignant neoplasm: Secondary | ICD-10-CM | POA: Diagnosis not present

## 2020-06-08 ENCOUNTER — Other Ambulatory Visit: Payer: Self-pay | Admitting: Emergency Medicine

## 2020-06-08 ENCOUNTER — Telehealth: Payer: Self-pay

## 2020-06-08 MED ORDER — GABAPENTIN 800 MG PO TABS: 800.0000 mg | ORAL_TABLET | Freq: Three times a day (TID) | ORAL | 1 refills | Status: DC

## 2020-06-08 NOTE — Progress Notes (Signed)
Cardiology Office Note   Date:  06/09/2020   ID:  Jasmine [name], DOB 1955-11-18, MRN [id number]  PCP:  Maximiano Coss, NP    No chief complaint on file.  HTN  Wt Readings from Last 3 Encounters:  06/09/20 182 lb 6.4 oz (82.7 kg)  06/04/20 180 lb 6.4 oz (81.8 kg)  05/27/20 180 lb 12.4 oz (82 kg)       History of Present Illness: Jasmine [name] is a 65 y.o. female  With HTn, HLD, self-reported mini strokes with residual left sided weakness, seizures, schizophrenia, asthma, anxiety, fibromyalgia, and near deafness. Carotid doppler showed minimal cardiac plaque bilaterally.  I saw this patient many years ago at [facility] cardiology. Feview of my notes from that time showed, she had  chest pain and required reassurance for her chest pain. She had a negative Cardiolite back in 2008. In 2006, she had a normal echocardiogram.  Normal echo in 2011.  She had a negative stress test in 2016 at [facility] center.  She has had a prior trip to the emergency room as well with a negative work-up in 2018.  She has been followed in the PharmD HTN clinic, but has not seen them in quite some time.  Care everywhere shows that she had a negative heart cath at [place] in Jan 2021.   A few weeks ago, she had an episode where she could not speak, and did not feel right.  She could not stand up. Went to ER on in 3/22:  "Patient presents ER chief complaint of generalized fatigue and weakness as well as sensation of overall paralysis and left-sided chest pain.  She states that the weakness has been going on and off for the past 2 weeks or so.  She had a significant episode today several hours prior to arrival.  She states that she has had left-sided chest pain on for a few days as well.  Describes a sharp and aching worse when she pushes on her left chest.  She states that her son came over and was asking about her chest pain and when he pushed on her left chest it became more severe and at that point she  decided to come to the ER.  She denies any fevers or cough or vomiting or diarrhea.  She states that she had intermittent episodes of a sensation of paralysis for which she follows up with a neurologist, but has not had an episode as severe as she had earlier today."  Troponin and ECG negative. She still has pain on the left side of the chest.  Still worse with pushing on it.  Not related to exertion.    Denies :  Dizziness. Leg edema. Nitroglycerin use. Orthopnea. Palpitations. Paroxysmal nocturnal dyspnea. Shortness of breath. Syncope.   Past Medical History:  Diagnosis Date  . Achilles tendinosis of left lower extremity 01/23/2017  . Anxiety   . Asthma   . Benign paroxysmal positional vertigo 01/05/2016  . Bilateral sensorineural hearing loss 12/22/2014  . Blepharitis of upper and lower eyelids of both eyes 04/27/2016  . Bradycardia 09/22/2014  . Cervicogenic headache 08/24/2016  . Chronic high back pain   . CKD (chronic kidney disease) 12/08/2014  . Cochlear implant in place   . Colon polyp   . Common migraine with intractable migraine 02/19/2020  . Constipation   . Diverticulitis   . Diverticulosis   . DJD (degenerative joint disease)   . Double vision   .  Dry eye syndrome of both lacrimal glands 04/27/2016  . Fibromyalgia   . Heart murmur   . High cholesterol   . Hypercalcemia   . Hypercholesterolemia   . Hyperlipidemia 02/01/2016  . Hypertension   . Hypoglycemia   . Insomnia 09/22/2010  . Kidney stone   . Meniscus tear    Right  . Migraine 08/07/2015  . Mild intermittent asthma without complication 24/40/1027  . Nephrolithiasis 03/17/2014  . Nocturnal leg cramps 05/31/2019  . Osteoporosis   . Primary hyperparathyroidism ([place]) 12/10/2014  . PTSD (post-traumatic stress disorder)   . Renal cyst 05/12/2013  . Schizophrenia ([place])    treated by Dr. Casimiro Needle  . Schizophrenia ([place])   . Seizures ([place])    last seizure 3 years ago, not on AED  . Stroke ([place]) 07/08/11   "I've  had mini strokes before; left side of face  is more down than right "  . Syncope and collapse    One episode - 07/10/15  . TIA (transient ischemic attack)     Past Surgical History:  Procedure Laterality Date  . CATARACT EXTRACTION    . COCHLEAR IMPLANT  2010   left  . DILATION AND CURETTAGE OF UTERUS    . HERNIA REPAIR    . [place]   left  . KIDNEY STONE SURGERY  ~ 2006  . PARATHYROIDECTOMY    . skin cancer removal    . TONSILLECTOMY     "as a child"  . tooth removal    . WISDOM TOOTH EXTRACTION       Current Outpatient Medications  Medication Sig Dispense Refill  . acetaminophen (TYLENOL) 500 MG tablet Take 500 mg by mouth every 8 (eight) hours as needed for moderate pain.    Marland Kitchen AIMOVIG 140 MG/ML SOAJ INJECT 140 MG UNDER THE SKIN ONCE EVERY 30 DAYS 1 mL 5  . albuterol (PROVENTIL) (2.5 MG/3ML) 0.083% nebulizer solution Take 3 mLs (2.5 mg total) by nebulization every 6 (six) hours as needed for wheezing or shortness of breath. 75 mL 0  . albuterol (VENTOLIN HFA) 108 (90 Base) MCG/ACT inhaler Inhale 2 puffs into the lungs every 6 (six) hours as needed for wheezing or shortness of breath. 6.7 g 2  . amoxicillin-clavulanate (AUGMENTIN) 875-125 MG tablet Take 1 tablet by mouth 2 (two) times daily. 14 tablet 0  . aspirin 81 MG EC tablet Take 81 mg by mouth daily.    Marland Kitchen azelastine (ASTELIN) 0.1 % nasal spray Place 1 spray into both nostrils 2 (two) times daily. Use in each nostril as directed 30 mL 6  . B COMPLEX VITAMINS PO Take by mouth.    . clonazePAM (KLONOPIN) 1 MG tablet SMARTSIG:3 Tablet(s) By Mouth Every Other Day PRN    . cloNIDine (CATAPRES) 0.1 MG tablet Take 1 tablet (0.1 mg total) by mouth as needed. Use as needed for blood pressure greater than 160/100 30 tablet 3  . DULoxetine (CYMBALTA) 30 MG capsule One capsule daily for 2 weeks, then take one twice a day 60 capsule 3  . felodipine (PLENDIL) 10 MG 24 hr tablet Take 1 tablet (10 mg total) by mouth  daily. 90 tablet 3  . fluticasone (FLONASE) 50 MCG/ACT nasal spray USE TWO SPRAY(S) IN EACH NOSTRIL ONCE DAILY 16 g 5  . Fluticasone-Salmeterol (ADVAIR) 100-50 MCG/DOSE AEPB     . gabapentin (NEURONTIN) 800 MG tablet Take 1 tablet (800 mg total) by mouth 3 (three) times daily. 270 tablet 1  .  LACTOBACILLUS RHAMNOSUS, GG, PO Take by mouth.     . levalbuterol (XOPENEX HFA) 45 MCG/ACT inhaler Inhale 2 puffs into the lungs every 8 (eight) hours as needed for wheezing. 1 each 3  . lidocaine (XYLOCAINE) 5 % ointment Apply 1 application topically as needed. 35.44 g 0  . losartan (COZAAR) 100 MG tablet TAKE ONE TABLET BY MOUTH DAILY 90 tablet 1  . metoprolol succinate (TOPROL-XL) 25 MG 24 hr tablet Take 1 tablet (25 mg total) by mouth daily. Take 25 mg by mouth daily. 90 tablet 3  . Multiple Vitamin (MULTIVITAMIN PO) Take by mouth.    Marland Kitchen omeprazole (PRILOSEC) 40 MG capsule Take 1 capsule (40 mg total) by mouth daily. 90 capsule 3  . orphenadrine (NORFLEX) 100 MG tablet Take 1 tablet (100 mg total) by mouth 2 (two) times daily as needed for muscle spasms. 180 tablet 1  . psyllium (METAMUCIL) 58.6 % packet Take 1 packet by mouth daily.    . rosuvastatin (CRESTOR) 20 MG tablet TAKE ONE TABLET BY MOUTH EVERY EVENING 90 tablet 2  . temazepam (RESTORIL) 15 MG capsule     . zolpidem (AMBIEN) 10 MG tablet Take 2 tablets by mouth at bedtime.      No current facility-administered medications for this visit.    Allergies:   Amlodipine, Diclofenac, Sulfa antibiotics, Famciclovir, Metronidazole, Soma [carisoprodol], Acyclovir, Acyclovir and related, Benadryl [diphenhydramine hcl], Cephalosporins, Codeine, Cyclobenzaprine, Diclofenac sodium, Dicyclomine, Hydrocodone, Hydrocodone-acetaminophen, Linzess [linaclotide], Nitrofurantoin monohyd macro, Suboxone [buprenorphine hcl-naloxone hcl], Tizanidine, Tramadol, Baclofen, Drug ingredient [ganciclovir], Meloxicam, and Sulfamethoxazole    Social History:  The patient   reports that she quit smoking about 16 years ago. Her smoking use included cigarettes. She has a 3.00 pack-year smoking history. She has never used smokeless tobacco. She reports current alcohol use. She reports that she does not use drugs.   Family History:  The patient's family history includes Alzheimer's disease in her paternal aunt; Arthritis in her father; Arthritis/Rheumatoid in her paternal aunt and sister; Diabetes in her paternal grandmother; Fibromyalgia in her sister and sister; HIV in her sister; Heart attack (age of onset: 55) in her father; Hyperlipidemia in her father; Hypertension in her father; Leukemia (age of onset: 5) in her mother; Neuropathy in her brother, sister, sister, sister, and sister; Skin cancer in her father; Stomach cancer in her paternal aunt; Thyroid disease in her sister.    ROS:  Please see the history of present illness.   Otherwise, review of systems are positive for chest pain.   All other systems are reviewed and negative.    PHYSICAL EXAM: VS:  BP 118/60   Pulse 78   Ht 5\' 4"  (1.626 m)   Wt 182 lb 6.4 oz (82.7 kg)   SpO2 95%   BMI 31.31 kg/m  , BMI Body mass index is 31.31 kg/m. GEN: Well nourished, well developed, in no acute distress  HEENT: normal  Neck: no JVD, carotid bruits, or masses Cardiac: RRR; no murmurs, rubs, or gallops,no edema  Respiratory:  clear to auscultation bilaterally, normal work of breathing GI: soft, nontender, nondistended, + BS MS: no deformity or atrophy ; chest wall  Skin: warm and dry, no rash Neuro:  Strength and sensation are intact Psych: euthymic mood, full affect   EKG:   The ekg ordered today demonstrates NSR, no ST changes   Recent Labs: 10/04/2019: TSH 4.460 03/25/2020: ALT 14 05/27/2020: BUN 18; Creatinine, Ser 0.56; Hemoglobin 13.6; Magnesium 2.0; Platelets 249; Potassium 3.7; Sodium 143  Lipid Panel    Component Value Date/Time   CHOL 180 10/04/2019 0944   TRIG 57 10/04/2019 0944   HDL 63  10/04/2019 0944   CHOLHDL 2.9 10/04/2019 0944   CHOLHDL 3 02/01/2016 1045   VLDL 17.6 02/01/2016 1045   LDLCALC 106 (H) 10/04/2019 0944     Other studies Reviewed: Additional studies/ records that were reviewed today with results demonstrating: ER records reviewed.    ASSESSMENT AND PLAN:  1. Chest pain: Atypical.  Plan echo per her request.  No ischemic w/u planned. Most likely this is chest wall pain. Her husband recently had endocarditis which I believe is a source of stress for her.  He required valve surgery. 2. HTN: The current medical regimen is effective;  continue present plan and medications.   3. Hyperlipidemia: LDL 106.  Continue rosuvastatin.  Healthy diet going forward.    Current medicines are reviewed at length with the patient today.  The patient concerns regarding her medicines were addressed.  The following changes have been made:  No change  Labs/ tests ordered today include:   Orders Placed This Encounter  Procedures  . ECHOCARDIOGRAM COMPLETE    Recommend 150 minutes/week of aerobic exercise Low fat, low carb, high fiber diet recommended  Disposition:   FU in 1 year   Signed, Larae Grooms, MD  06/09/2020 2:45 PM    [facility] [address], [place], [place]  [address] Phone: [phone]; Fax: [phone]

## 2020-06-08 NOTE — Telephone Encounter (Signed)
Patient left a voicemail this morning asking for a refill on her gabapentin to be sent to [facility] in [place]  If there are any questions please call her back.

## 2020-06-09 ENCOUNTER — Telehealth: Payer: Self-pay

## 2020-06-09 ENCOUNTER — Encounter: Payer: Self-pay | Admitting: Interventional Cardiology

## 2020-06-09 ENCOUNTER — Other Ambulatory Visit: Payer: Self-pay

## 2020-06-09 ENCOUNTER — Encounter: Payer: Self-pay | Admitting: Registered Nurse

## 2020-06-09 ENCOUNTER — Ambulatory Visit (INDEPENDENT_AMBULATORY_CARE_PROVIDER_SITE_OTHER): Payer: 59 | Admitting: Interventional Cardiology

## 2020-06-09 VITALS — BP 118/60 | HR 78 | Ht 64.0 in | Wt 182.4 lb

## 2020-06-09 DIAGNOSIS — E785 Hyperlipidemia, unspecified: Secondary | ICD-10-CM

## 2020-06-09 DIAGNOSIS — R072 Precordial pain: Secondary | ICD-10-CM

## 2020-06-09 DIAGNOSIS — I10 Essential (primary) hypertension: Secondary | ICD-10-CM

## 2020-06-09 NOTE — Telephone Encounter (Signed)
Patient states you two discussed prescription for Celebrex. Pleas Advise.

## 2020-06-09 NOTE — Telephone Encounter (Signed)
MEDICATION: celebrex   PH[facility]Y: [facility]  Comments: pt would like a call back about prescription. Pt states Rich was going to prescribe this when they spoke on Thursday 4/7.   **Let patient know to contact pharmacy at the end of the day to make sure medication is ready. **  ** Please notify patient to allow 48-72 hours to process**  **Encourage patient to contact the pharmacy for refills or they can request refills through [facility]**

## 2020-06-09 NOTE — Patient Instructions (Signed)
Medication Instructions:  Your physician recommends that you continue on your current medications as directed. Please refer to the Current Medication list given to you today.  *If you need a refill on your cardiac medications before your next appointment, please call your pharmacy*   Lab Work: none If you have labs (blood work) drawn today and your tests are completely normal, you will receive your results only by: Marland Kitchen MyChart Message (if you have MyChart) OR . A paper copy in the mail If you have any lab test that is abnormal or we need to change your treatment, we will call you to review the results.   Testing/Procedures: Your physician has requested that you have an echocardiogram. Echocardiography is a painless test that uses sound waves to create images of your heart. It provides your doctor with information about the size and shape of your heart and how well your heart's chambers and valves are working. This procedure takes approximately one hour. There are no restrictions for this procedure.     Follow-Up: At [facility], you and your health needs are our priority.  As part of our continuing mission to provide you with exceptional heart care, we have created designated Provider Care Teams.  These Care Teams include your primary Cardiologist (physician) and Advanced Practice Providers (APPs -  Physician Assistants and Nurse Practitioners) who all work together to provide you with the care you need, when you need it.  We recommend signing up for the patient portal called "MyChart".  Sign up information is provided on this After Visit Summary.  MyChart is used to connect with patients for Virtual Visits (Telemedicine).  Patients are able to view lab/test results, encounter notes, upcoming appointments, etc.  Non-urgent messages can be sent to your provider as well.   To learn more about what you can do with MyChart, go to [email or web address].    Your next appointment:   12  month(s)  The format for your next appointment:   In Person  Provider:   You may see Larae Grooms, MD or one of the following Advanced Practice Providers on your designated Care Team:    Melina Copa, PA-C  Ermalinda Barrios, PA-C    Other Instructions

## 2020-06-10 ENCOUNTER — Ambulatory Visit (HOSPITAL_BASED_OUTPATIENT_CLINIC_OR_DEPARTMENT_OTHER)
Admission: RE | Admit: 2020-06-10 | Discharge: 2020-06-10 | Disposition: A | Discharge status: Home / Self Care | Payer: 59 | Source: Ambulatory Visit | Attending: Interventional Cardiology | Admitting: Interventional Cardiology

## 2020-06-10 ENCOUNTER — Other Ambulatory Visit: Payer: Self-pay | Admitting: Registered Nurse

## 2020-06-10 DIAGNOSIS — R072 Precordial pain: Secondary | ICD-10-CM | POA: Insufficient documentation

## 2020-06-10 DIAGNOSIS — M25511 Pain in right shoulder: Secondary | ICD-10-CM

## 2020-06-10 LAB — ECHOCARDIOGRAM COMPLETE
Area-P 1/2: 2.84 cm2
S' Lateral: 2.95 cm
Single Plane A4C EF: 61.3 %

## 2020-06-10 MED ORDER — CELECOXIB 100 MG PO CAPS: 100.0000 mg | ORAL_CAPSULE | Freq: Two times a day (BID) | ORAL | 0 refills | Status: DC

## 2020-06-10 NOTE — Progress Notes (Signed)
In depth discussion with patient regarding her allergy list and this medication. Her reactions to the allergens listed have included largely GI side effects that we're hoping to avoid by using cox-2 selective celecoxib. She understands r/b/se of this medication and will cease use and seek care should she experience adverse effects.  Kathrin Ruddy, NP

## 2020-06-10 NOTE — Telephone Encounter (Signed)
Medication is being sent to the Pharmacy.

## 2020-06-14 ENCOUNTER — Other Ambulatory Visit: Payer: Self-pay | Admitting: Neurology

## 2020-06-21 ENCOUNTER — Other Ambulatory Visit: Payer: Self-pay | Admitting: Registered Nurse

## 2020-06-21 DIAGNOSIS — M25511 Pain in right shoulder: Secondary | ICD-10-CM

## 2020-06-23 ENCOUNTER — Other Ambulatory Visit: Payer: Self-pay | Admitting: Registered Nurse

## 2020-06-23 DIAGNOSIS — M545 Low back pain, unspecified: Secondary | ICD-10-CM

## 2020-06-23 DIAGNOSIS — G8929 Other chronic pain: Secondary | ICD-10-CM

## 2020-06-29 ENCOUNTER — Telehealth: Payer: Self-pay

## 2020-06-29 NOTE — Telephone Encounter (Signed)
Patient is calling in stating her local pharmacy does not have  azelastine (ASTELIN) 0.1 % nasal spray, wondering if there is something else to recommend similar to the nasal spray.

## 2020-07-03 ENCOUNTER — Other Ambulatory Visit: Payer: Self-pay | Admitting: Registered Nurse

## 2020-07-03 DIAGNOSIS — M25511 Pain in right shoulder: Secondary | ICD-10-CM

## 2020-07-08 ENCOUNTER — Other Ambulatory Visit (HOSPITAL_COMMUNITY): Payer: 59

## 2020-07-14 ENCOUNTER — Other Ambulatory Visit: Payer: Self-pay | Admitting: Registered Nurse

## 2020-07-14 DIAGNOSIS — M25511 Pain in right shoulder: Secondary | ICD-10-CM

## 2020-07-23 ENCOUNTER — Other Ambulatory Visit: Payer: Self-pay

## 2020-07-23 ENCOUNTER — Encounter: Payer: Self-pay | Admitting: Registered Nurse

## 2020-07-23 ENCOUNTER — Encounter: Payer: Self-pay | Admitting: Neurology

## 2020-07-23 ENCOUNTER — Ambulatory Visit (INDEPENDENT_AMBULATORY_CARE_PROVIDER_SITE_OTHER): Payer: 59 | Admitting: Neurology

## 2020-07-23 ENCOUNTER — Telehealth: Payer: Self-pay | Admitting: Neurology

## 2020-07-23 VITALS — BP 155/80 | HR 98 | Ht 64.0 in | Wt 188.6 lb

## 2020-07-23 DIAGNOSIS — M797 Fibromyalgia: Secondary | ICD-10-CM | POA: Diagnosis not present

## 2020-07-23 DIAGNOSIS — R4789 Other speech disturbances: Secondary | ICD-10-CM

## 2020-07-23 DIAGNOSIS — G43009 Migraine without aura, not intractable, without status migrainosus: Secondary | ICD-10-CM | POA: Diagnosis not present

## 2020-07-23 NOTE — Telephone Encounter (Signed)
I called the patient.  The patient indicates that Cymbalta upset her stomach, she has gone off the medication.  I will remove this from the list of her medications.

## 2020-07-23 NOTE — Progress Notes (Signed)
Reason for visit: Speech arrest  Referring physician: [place]  Jasmine [name] is a 65 y.o. female  History of present illness:  Jasmine [name] is a 65 year old left-handed white female with a history of chronic neck and low back pain, fibromyalgia type symptoms, she has a left cochlear implant that is MRI compatible.  She has a history of migraine headaches and has been on Aimovig for this.  The patient however claims that over the last 3 months she has not been able to get in her medication because the syringe seems to malfunction, she will inject her self but the medication does not exit the syringe.  She has not contacted our office about this.  She has gone from having 3-4 headaches a month to headaches every 3 days or so.  The patient went to the emergency room on 27 May 2020 with a 2-week history of increased fatigue and weakness sensation.  The patient began having chest wall pain involving the left side of the chest, pushing on the chest resulted in severe pain that also went down the left arm and left leg.  Around this time, the patient had a 2-hour event of inability to speak, she also had a headache associated with some blurred vision and nausea, visual input seemed too bright.  The headache lasted 3 to 4 days and then finally resolved.  The patient has had some tingling episodes involving the hands and feet that will come and go.  The patient has had some episodes of slurred speech in the past associated with her headaches but not speech arrest.  The patient knew what she would want to say but could not get the words out.  She did not have any loss of consciousness.  The patient felt weak all over.  She does have some slight neck pain off and on.  CT scan of the brain was done in the emergency room and did not show any acute abnormalities.  She is sent to this office for further evaluation.  Past Medical History:  Diagnosis Date  . Achilles tendinosis of left lower extremity 01/23/2017  .  Anxiety   . Asthma   . Benign paroxysmal positional vertigo 01/05/2016  . Bilateral sensorineural hearing loss 12/22/2014  . Blepharitis of upper and lower eyelids of both eyes 04/27/2016  . Bradycardia 09/22/2014  . Cervicogenic headache 08/24/2016  . Chronic high back pain   . CKD (chronic kidney disease) 12/08/2014  . Cochlear implant in place   . Colon polyp   . Common migraine with intractable migraine 02/19/2020  . Constipation   . Diverticulitis   . Diverticulosis   . DJD (degenerative joint disease)   . Double vision   . Dry eye syndrome of both lacrimal glands 04/27/2016  . Fibromyalgia   . Heart murmur   . High cholesterol   . Hypercalcemia   . Hypercholesterolemia   . Hyperlipidemia 02/01/2016  . Hypertension   . Hypoglycemia   . Insomnia 09/22/2010  . Kidney stone   . Meniscus tear    Right  . Migraine 08/07/2015  . Mild intermittent asthma without complication 21/19/4174  . Nephrolithiasis 03/17/2014  . Nocturnal leg cramps 05/31/2019  . Osteoporosis   . Primary hyperparathyroidism ([place]) 12/10/2014  . PTSD (post-traumatic stress disorder)   . Renal cyst 05/12/2013  . Schizophrenia ([place])    treated by Dr. Casimiro Needle  . Schizophrenia ([place])   . Seizures ([place])    last seizure 3 years ago,  not on AED  . Stroke ([place]) 07/08/11   "I've had mini strokes before; left side of face  is more down than right "  . Syncope and collapse    One episode - 07/10/15  . TIA (transient ischemic attack)     Past Surgical History:  Procedure Laterality Date  . CATARACT EXTRACTION    . COCHLEAR IMPLANT  2010   left  . DILATION AND CURETTAGE OF UTERUS    . HERNIA REPAIR    . [place]   left  . KIDNEY STONE SURGERY  ~ 2006  . PARATHYROIDECTOMY    . skin cancer removal    . TONSILLECTOMY     "as a child"  . tooth removal    . WISDOM TOOTH EXTRACTION      Family History  Problem Relation Age of Onset  . Heart attack Father 58       Living  . Arthritis  Father   . Skin cancer Father   . Hypertension Father   . Hyperlipidemia Father   . Leukemia Mother 55       Deceased  . Alzheimer's disease Paternal Aunt        X2  . Stomach cancer Paternal Aunt        x1  . Arthritis/Rheumatoid Paternal Aunt        x1  . Neuropathy Brother        Peripheal  . Fibromyalgia Sister   . Neuropathy Sister   . HIV Sister   . Fibromyalgia Sister   . Neuropathy Sister   . Arthritis/Rheumatoid Sister   . Neuropathy Sister   . Diabetes Paternal Grandmother   . Thyroid disease Sister   . Neuropathy Sister     Social history:  reports that she quit smoking about 16 years ago. Her smoking use included cigarettes. She has a 3.00 pack-year smoking history. She has never used smokeless tobacco. She reports current alcohol use. She reports that she does not use drugs.  Medications:  Prior to Admission medications   Medication Sig Start Date End Date Taking? Authorizing Provider  acetaminophen (TYLENOL) 500 MG tablet Take 500 mg by mouth every 8 (eight) hours as needed for moderate pain.    [provider]  AIMOVIG 140 MG/ML SOAJ INJECT 140 MG UNDER THE SKIN ONCE EVERY 30 DAYS 06/04/20   Kathrynn Ducking, MD  albuterol (PROVENTIL) (2.5 MG/3ML) 0.083% nebulizer solution Take 3 mLs (2.5 mg total) by nebulization every 6 (six) hours as needed for wheezing or shortness of breath. 02/27/20   Just, Laurita Quint, FNP  albuterol (VENTOLIN HFA) 108 (90 Base) MCG/ACT inhaler Inhale 2 puffs into the lungs every 6 (six) hours as needed for wheezing or shortness of breath. 02/27/20   Just, Laurita Quint, FNP  amoxicillin-clavulanate (AUGMENTIN) 875-125 MG tablet Take 1 tablet by mouth 2 (two) times daily. 03/18/20   Maximiano Coss, NP  aspirin 81 MG EC tablet Take 81 mg by mouth daily.    [provider]  azelastine (ASTELIN) 0.1 % nasal spray Place 1 spray into both nostrils 2 (two) times daily. Use in each nostril as directed 05/19/20   Maximiano Coss, NP  B  COMPLEX VITAMINS PO Take by mouth.    [provider]  celecoxib (CELEBREX) 100 MG capsule TAKE ONE CAPSULE BY MOUTH TWICE A DAY 07/03/20   Maximiano Coss, NP  clonazePAM (KLONOPIN) 1 MG tablet SMARTSIG:3 Tablet(s) By Mouth Every Other Day PRN 04/08/19  [provider]  cloNIDine (CATAPRES) 0.1 MG tablet Take 1 tablet (0.1 mg total) by mouth as needed. Use as needed for blood pressure greater than 160/100 02/13/19   Daune Perch, NP  DULoxetine (CYMBALTA) 30 MG capsule TAKE ONE CAPSULE DAILY FOR TWO WEEKS, THEN TAKE ONE TWICE DAILY 06/15/20   Kathrynn Ducking, MD  felodipine (PLENDIL) 10 MG 24 hr tablet Take 1 tablet (10 mg total) by mouth daily. 01/03/20   Maximiano Coss, NP  fluticasone Asencion Islam) 50 MCG/ACT nasal spray USE TWO SPRAY(S) IN EACH NOSTRIL ONCE DAILY 02/27/20   Maximiano Coss, NP  Fluticasone-Salmeterol (ADVAIR) 100-50 MCG/DOSE AEPB     [provider]  gabapentin (NEURONTIN) 800 MG tablet Take 1 tablet (800 mg total) by mouth 3 (three) times daily. 06/08/20   Kathrynn Ducking, MD  LACTOBACILLUS RHAMNOSUS, GG, PO Take by mouth.     [provider]  levalbuterol Penne Lash HFA) 45 MCG/ACT inhaler Inhale 2 puffs into the lungs every 8 (eight) hours as needed for wheezing. 02/27/20   Just, Laurita Quint, FNP  lidocaine (XYLOCAINE) 5 % ointment Apply 1 application topically as needed. 01/03/20   Maximiano Coss, NP  losartan (COZAAR) 100 MG tablet TAKE ONE TABLET BY MOUTH DAILY 03/28/20   Maximiano Coss, NP  metoprolol succinate (TOPROL-XL) 25 MG 24 hr tablet Take 1 tablet (25 mg total) by mouth daily. Take 25 mg by mouth daily. 07/15/19   Maximiano Coss, NP  Multiple Vitamin (MULTIVITAMIN PO) Take by mouth.    [provider]  omeprazole (PRILOSEC) 40 MG capsule Take 1 capsule (40 mg total) by mouth daily. 01/03/20   Maximiano Coss, NP  orphenadrine (NORFLEX) 100 MG tablet TAKE 1 TABLET BY MOUTH TWO TIMES A DAY AS NEEDED FOR MUSCLE SPASMS 06/24/20    Maximiano Coss, NP  psyllium (METAMUCIL) 58.6 % packet Take 1 packet by mouth daily.    [provider]  rosuvastatin (CRESTOR) 20 MG tablet TAKE ONE TABLET BY MOUTH EVERY EVENING 01/28/20   Maximiano Coss, NP  temazepam (RESTORIL) 15 MG capsule     [provider]  zolpidem (AMBIEN) 10 MG tablet Take 2 tablets by mouth at bedtime.  04/06/16   [provider]      Allergies  Allergen Reactions  . Amlodipine Shortness Of Breath    Pt states she also had very bad pain   . Diclofenac Nausea And Vomiting and Nausea Only  . Sulfa Antibiotics Hives and Rash  . Famciclovir Other (See Comments)  . Metronidazole Nausea And Vomiting  . Soma [Carisoprodol] Other (See Comments)    Moody and sleepy Cognitive Function, Daytime sleepiness  . Acyclovir   . Acyclovir And Related   . Benadryl [Diphenhydramine Hcl]     Severe emotional reaction and doesn't work well with Pt. Past history  . Cephalosporins Hives  . Codeine Other (See Comments)    Makes patient feel odd ; "sometimes mild; sometimes severe reaction; mostly severe"  . Cyclobenzaprine Other (See Comments)    Muscle Aches.  . Diclofenac Sodium Nausea And Vomiting  . Dicyclomine   . Hydrocodone   . Hydrocodone-Acetaminophen   . Linzess [Linaclotide] Diarrhea  . Nitrofurantoin Monohyd Macro     Felt funny  . Suboxone [Buprenorphine Hcl-Naloxone Hcl] Nausea And Vomiting  . Tizanidine     Other reaction(s): Other (See Comments) insomnia  . Tramadol   . Baclofen Nausea Only  . Drug Ingredient [Ganciclovir] Rash  . Meloxicam     Other  reaction(s): Confusion  . Sulfamethoxazole Rash    ROS:  Out of a complete 14 system review of symptoms, the patient complains only of the following symptoms, and all other reviewed systems are negative.  Transient loss of speech Chronic neck, low back pain, left arm and left leg pain Numbness Decreased hearing  Blood pressure (!) 155/80, pulse 98, height 5\' 4"  (1.626  m), weight 188 lb 9.6 oz (85.5 kg).  Physical Exam  General: The patient is alert and cooperative at the time of the examination.  Eyes: Pupils are equal, round, and reactive to light. Discs are flat bilaterally.  Neck: The neck is supple, no carotid bruits are noted.  Respiratory: The respiratory examination is clear.  Cardiovascular: The cardiovascular examination reveals a regular rate and rhythm, no obvious murmurs or rubs are noted.  Skin: Extremities are without significant edema.  Neurologic Exam  Mental status: The patient is alert and oriented x 3 at the time of the examination. The patient has apparent normal recent and remote memory, with an apparently normal attention span and concentration ability.  Cranial nerves: Facial symmetry is present. There is good sensation of the face to pinprick and soft touch bilaterally. The strength of the facial muscles and the muscles to head turning and shoulder shrug are normal bilaterally. Speech is well enunciated, no aphasia or dysarthria is noted. Extraocular movements are full. Visual fields are full. The tongue is midline, and the patient has symmetric elevation of the soft palate. No obvious hearing deficits are noted.  Motor: The motor testing reveals 5 over 5 strength of all 4 extremities. Good symmetric motor tone is noted throughout.  Sensory: Sensory testing is intact to pinprick, soft touch, vibration sensation, and position sense on all 4 extremities, with exception some decreased position sense in the right foot. No evidence of extinction is noted.  Coordination: Cerebellar testing reveals good finger-nose-finger and heel-to-shin bilaterally.  Gait and station: Gait is normal. Tandem gait is unsteady. Romberg is positive, the patient will fall backwards. No drift is seen.  Reflexes: Deep tendon reflexes are symmetric and normal bilaterally. Toes are downgoing bilaterally.   Ct head 05/27/20:  IMPRESSION: 1. Extensive  artifact from left cochlear implant obscures portions of the left hemisphere. Allowing for this, no definite CT evidence for acute intracranial abnormality. 2. Fluid in the left mastoid air cells.   Assessment/Plan:  1.  Episode of speech arrest  2.  Migraine headache  3.  Chronic pain syndrome, neck and low back, fibromyalgia  This patient does have multiple somatic complaints.  She went to the emergency room with an episode of speech arrest of associated with a headache.  This likely represents a migrainous event.  The patient has not been able to get in her Aimovig medication over the last 3 months, and likely is having increased frequency of headaches because of this.  The patient will undergo a carotid Doppler study, she will have an EEG evaluation to exclude seizures.  The patient will come into our office on her next injection of the Aimovig to ensure that the syringe is working properly.  She will follow-up in 4 months.  Jill Alexanders MD 07/23/2020 7:55 AM  [facility] [address] [place], [place] [address]  Phone [phone] Fax [phone]

## 2020-07-31 ENCOUNTER — Other Ambulatory Visit: Payer: Self-pay | Admitting: Registered Nurse

## 2020-07-31 DIAGNOSIS — M25511 Pain in right shoulder: Secondary | ICD-10-CM

## 2020-07-31 MED ORDER — NAPROXEN SODIUM 550 MG PO TABS: 550.0000 mg | ORAL_TABLET | Freq: Two times a day (BID) | ORAL | 3 refills | Status: DC

## 2020-07-31 NOTE — Progress Notes (Signed)
Pt reports doing well with naproxen in the past Reviewed allergies to diclofenac, agree that it seems more intolerance than allergy Reviewed r/b/se  Kathrin Ruddy, NP

## 2020-08-04 ENCOUNTER — Other Ambulatory Visit: Payer: Self-pay | Admitting: Registered Nurse

## 2020-08-04 DIAGNOSIS — M545 Low back pain, unspecified: Secondary | ICD-10-CM

## 2020-08-04 DIAGNOSIS — G8929 Other chronic pain: Secondary | ICD-10-CM

## 2020-08-05 ENCOUNTER — Other Ambulatory Visit: Payer: Self-pay | Admitting: Registered Nurse

## 2020-08-05 ENCOUNTER — Other Ambulatory Visit: Payer: Self-pay

## 2020-08-05 ENCOUNTER — Ambulatory Visit (INDEPENDENT_AMBULATORY_CARE_PROVIDER_SITE_OTHER): Payer: 59 | Admitting: Neurology

## 2020-08-05 ENCOUNTER — Encounter: Payer: Self-pay | Admitting: Registered Nurse

## 2020-08-05 ENCOUNTER — Ambulatory Visit (INDEPENDENT_AMBULATORY_CARE_PROVIDER_SITE_OTHER): Payer: 59 | Admitting: Registered Nurse

## 2020-08-05 VITALS — BP 166/86 | HR 75 | Temp 98.2°F | Ht 64.0 in | Wt 186.2 lb

## 2020-08-05 DIAGNOSIS — R4701 Aphasia: Secondary | ICD-10-CM | POA: Diagnosis not present

## 2020-08-05 DIAGNOSIS — G4486 Cervicogenic headache: Secondary | ICD-10-CM

## 2020-08-05 DIAGNOSIS — G43019 Migraine without aura, intractable, without status migrainosus: Secondary | ICD-10-CM

## 2020-08-05 DIAGNOSIS — R4789 Other speech disturbances: Secondary | ICD-10-CM

## 2020-08-05 MED ORDER — RIZATRIPTAN BENZOATE 10 MG PO TABS: 10.0000 mg | ORAL_TABLET | ORAL | 0 refills | Status: DC | PRN

## 2020-08-05 NOTE — Patient Instructions (Signed)
Ms. Carrero -  Always a pleasure to see you  Let's try the Maxalt (Rizatriptan) 5mg  daily for migraine. Could provide additional relief with the Aimovig.   I'll CC Dr. [name] and get his input.  I'll reach out on Friday if I haven't heard from you to see what else we can do before the weekend  Thank you  Denice Paradise

## 2020-08-05 NOTE — Progress Notes (Signed)
Established Patient Office Visit  Subjective:  Patient ID: Jasmine [name], female    DOB: 1955-05-08  Age: 65 y.o. MRN: [id number]  CC:  Chief Complaint  Patient presents with  . Hypertension  . Migraine    HPI Jasmine [name] presents for migraine  Ongoing, chronic issue. Notes these past 5 days have been severe L sided head pain. Notes face pain. Some L shoulder and arm pain No drooping in face. Speech is baseline. No cognitive changes.  Does have hx of cervical myelopathy and aspects of headache thought to be cervicogenic in origin in relation to her migraines.   Did get Aimovig injection around 8 days ago. Headache persists. She does have another injection coming up. This medication has helped her in the past, unsure why not now.   Has tried a variety of treatments in the past. Did not tolerate sumatriptan wel. Has not been on other triptans to her knowledge. No evidence of rizatriptan in her chart. Willing to try as abortive therapy. No evidence this will interact with her existing therapy.  Otherwise, interested in restarting PT for her neck and shoulders. Requesting referral. Past Medical History:  Diagnosis Date  . Achilles tendinosis of left lower extremity 01/23/2017  . Anxiety   . Asthma   . Benign paroxysmal positional vertigo 01/05/2016  . Bilateral sensorineural hearing loss 12/22/2014  . Blepharitis of upper and lower eyelids of both eyes 04/27/2016  . Bradycardia 09/22/2014  . Cervicogenic headache 08/24/2016  . Chronic high back pain   . CKD (chronic kidney disease) 12/08/2014  . Cochlear implant in place   . Colon polyp   . Common migraine with intractable migraine 02/19/2020  . Constipation   . Diverticulitis   . Diverticulosis   . DJD (degenerative joint disease)   . Double vision   . Dry eye syndrome of both lacrimal glands 04/27/2016  . Fibromyalgia   . Heart murmur   . High cholesterol   . Hypercalcemia   . Hypercholesterolemia   .  Hyperlipidemia 02/01/2016  . Hypertension   . Hypoglycemia   . Insomnia 09/22/2010  . Kidney stone   . Meniscus tear    Right  . Migraine 08/07/2015  . Mild intermittent asthma without complication 43/15/4008  . Nephrolithiasis 03/17/2014  . Nocturnal leg cramps 05/31/2019  . Osteoporosis   . Primary hyperparathyroidism ([place]) 12/10/2014  . PTSD (post-traumatic stress disorder)   . Renal cyst 05/12/2013  . Schizophrenia ([place])    treated by Dr. Casimiro Needle  . Schizophrenia ([place])   . Seizures ([place])    last seizure 3 years ago, not on AED  . Stroke ([place]) 07/08/11   "I've had mini strokes before; left side of face  is more down than right "  . Syncope and collapse    One episode - 07/10/15  . TIA (transient ischemic attack)     Past Surgical History:  Procedure Laterality Date  . CATARACT EXTRACTION    . COCHLEAR IMPLANT  2010   left  . DILATION AND CURETTAGE OF UTERUS    . HERNIA REPAIR    . [place]   left  . KIDNEY STONE SURGERY  ~ 2006  . PARATHYROIDECTOMY    . skin cancer removal    . TONSILLECTOMY     "as a child"  . tooth removal    . WISDOM TOOTH EXTRACTION      Family History  Problem Relation Age of Onset  . Heart  attack Father 35       Living  . Arthritis Father   . Skin cancer Father   . Hypertension Father   . Hyperlipidemia Father   . Leukemia Mother 14       Deceased  . Alzheimer's disease Paternal Aunt        X2  . Stomach cancer Paternal Aunt        x1  . Arthritis/Rheumatoid Paternal Aunt        x1  . Neuropathy Brother        Peripheal  . Fibromyalgia Sister   . Neuropathy Sister   . HIV Sister   . Fibromyalgia Sister   . Neuropathy Sister   . Arthritis/Rheumatoid Sister   . Neuropathy Sister   . Diabetes Paternal Grandmother   . Thyroid disease Sister   . Neuropathy Sister     Social History   Socioeconomic History  . Marital status: Married    Spouse name: [name]  . Number of children: 3  . Years of education: 2  yrs coll  . Highest education level: Not on file  Occupational History  . Occupation: Disabled  Tobacco Use  . Smoking status: Former Smoker    Packs/day: 1.00    Years: 3.00    Pack years: 3.00    Types: Cigarettes    Quit date: 02/29/2004    Years since quitting: 16.4  . Smokeless tobacco: Never Used  Vaping Use  . Vaping Use: Never used  Substance and Sexual Activity  . Alcohol use: Yes    Alcohol/week: 0.0 standard drinks    Comment: "glass of wine q once in awhile; not very often; do it on special occasion"  . Drug use: No  . Sexual activity: Never  Other Topics Concern  . Not on file  Social History Narrative   Lives at home with her husband.   Caffeine 6-7 cups caffeine daily   Left-handed.   Social Determinants of Health   Financial Resource Strain: Not on file  Food Insecurity: Not on file  Transportation Needs: Not on file  Physical Activity: Not on file  Stress: Not on file  Social Connections: Not on file  Intimate Partner Violence: Not on file    Outpatient Medications Prior to Visit  Medication Sig Dispense Refill  . acetaminophen (TYLENOL) 500 MG tablet Take 500 mg by mouth every 8 (eight) hours as needed for moderate pain.    Marland Kitchen AIMOVIG 140 MG/ML SOAJ INJECT 140 MG UNDER THE SKIN ONCE EVERY 30 DAYS 1 mL 5  . albuterol (PROVENTIL) (2.5 MG/3ML) 0.083% nebulizer solution Take 3 mLs (2.5 mg total) by nebulization every 6 (six) hours as needed for wheezing or shortness of breath. 75 mL 0  . albuterol (VENTOLIN HFA) 108 (90 Base) MCG/ACT inhaler Inhale 2 puffs into the lungs every 6 (six) hours as needed for wheezing or shortness of breath. 6.7 g 2  . aspirin 81 MG EC tablet Take 81 mg by mouth daily.    Marland Kitchen azelastine (ASTELIN) 0.1 % nasal spray Place 1 spray into both nostrils 2 (two) times daily. Use in each nostril as directed 30 mL 6  . B COMPLEX VITAMINS PO Take by mouth.    . clonazePAM (KLONOPIN) 1 MG tablet SMARTSIG:3 Tablet(s) By Mouth Every Other Day  PRN    . cloNIDine (CATAPRES) 0.1 MG tablet Take 1 tablet (0.1 mg total) by mouth as needed. Use as needed for blood pressure greater than 160/100 30 tablet  3  . felodipine (PLENDIL) 10 MG 24 hr tablet Take 1 tablet (10 mg total) by mouth daily. 90 tablet 3  . fluticasone (FLONASE) 50 MCG/ACT nasal spray USE TWO SPRAY(S) IN EACH NOSTRIL ONCE DAILY 16 g 5  . Fluticasone-Salmeterol (ADVAIR) 100-50 MCG/DOSE AEPB     . gabapentin (NEURONTIN) 800 MG tablet Take 1 tablet (800 mg total) by mouth 3 (three) times daily. 270 tablet 1  . levalbuterol (XOPENEX HFA) 45 MCG/ACT inhaler Inhale 2 puffs into the lungs every 8 (eight) hours as needed for wheezing. 1 each 3  . losartan (COZAAR) 100 MG tablet TAKE ONE TABLET BY MOUTH DAILY 90 tablet 1  . metoprolol succinate (TOPROL-XL) 25 MG 24 hr tablet TAKE ONE TABLET BY MOUTH DAILY 90 tablet 1  . Multiple Vitamin (MULTIVITAMIN PO) Take by mouth.    . naproxen sodium (ANAPROX DS) 550 MG tablet Take 1 tablet (550 mg total) by mouth 2 (two) times daily with a meal. 30 tablet 3  . omeprazole (PRILOSEC) 40 MG capsule Take 1 capsule (40 mg total) by mouth daily. 90 capsule 3  . orphenadrine (NORFLEX) 100 MG tablet TAKE ONE TABLET BY MOUTH TWICE A DAY AS NEEDED FOR MUSCLE SPASMS 60 tablet 0  . psyllium (METAMUCIL) 58.6 % packet Take 1 packet by mouth daily.    . rosuvastatin (CRESTOR) 20 MG tablet TAKE ONE TABLET BY MOUTH EVERY EVENING 90 tablet 2  . temazepam (RESTORIL) 15 MG capsule     . zolpidem (AMBIEN) 10 MG tablet Take 2 tablets by mouth at bedtime.     . lidocaine (XYLOCAINE) 5 % ointment Apply 1 application topically as needed. 35.44 g 0  . LACTOBACILLUS RHAMNOSUS, GG, PO Take by mouth.      No facility-administered medications prior to visit.    Allergies  Allergen Reactions  . Amlodipine Shortness Of Breath    Pt states she also had very bad pain   . Diclofenac Nausea And Vomiting and Nausea Only  . Sulfa Antibiotics Hives and Rash  . Famciclovir  Other (See Comments)  . Metronidazole Nausea And Vomiting  . Soma [Carisoprodol] Other (See Comments)    Moody and sleepy Cognitive Function, Daytime sleepiness  . Acyclovir   . Acyclovir And Related   . Benadryl [Diphenhydramine Hcl]     Severe emotional reaction and doesn't work well with Pt. Past history  . Cephalosporins Hives  . Codeine Other (See Comments)    Makes patient feel odd ; "sometimes mild; sometimes severe reaction; mostly severe"  . Cyclobenzaprine Other (See Comments)    Muscle Aches.  Marland Kitchen Cymbalta [Duloxetine Hcl]     Stomach upset  . Diclofenac Sodium Nausea And Vomiting  . Dicyclomine   . Hydrocodone   . Hydrocodone-Acetaminophen   . Linzess [Linaclotide] Diarrhea  . Nitrofurantoin Monohyd Macro     Felt funny  . Suboxone [Buprenorphine Hcl-Naloxone Hcl] Nausea And Vomiting  . Tizanidine     Other reaction(s): Other (See Comments) insomnia  . Tramadol   . Baclofen Nausea Only  . Drug Ingredient [Ganciclovir] Rash  . Meloxicam     Other reaction(s): Confusion  . Sulfamethoxazole Rash    ROS Review of Systems Per hpi, otherwise negative    Objective:    Physical Exam Vitals and nursing note reviewed.  Constitutional:      General: She is not in acute distress.    Appearance: Normal appearance. She is normal weight. She is not ill-appearing, toxic-appearing or diaphoretic.  HENT:  Right Ear: Tympanic membrane, ear canal and external ear normal. There is no impacted cerumen.     Left Ear: Tympanic membrane, ear canal and external ear normal. There is no impacted cerumen.  Cardiovascular:     Rate and Rhythm: Normal rate and regular rhythm.     Heart sounds: Normal heart sounds. No murmur heard. No friction rub. No gallop.   Pulmonary:     Effort: Pulmonary effort is normal. No respiratory distress.     Breath sounds: Normal breath sounds. No stridor. No wheezing, rhonchi or rales.  Chest:     Chest wall: No tenderness.  Skin:    General:  Skin is warm and dry.  Neurological:     General: No focal deficit present.     Mental Status: She is alert and oriented to person, place, and time. Mental status is at baseline.     Cranial Nerves: No cranial nerve deficit.     Motor: No weakness.     Gait: Gait normal.  Psychiatric:        Mood and Affect: Mood normal.        Behavior: Behavior normal.        Thought Content: Thought content normal.        Judgment: Judgment normal.     BP (!) 166/86   Pulse 75   Temp 98.2 F (36.8 C)   Ht 5\' 4"  (1.626 m)   Wt 186 lb 3.2 oz (84.5 kg)   SpO2 96%   BMI 31.96 kg/m  Wt Readings from Last 3 Encounters:  08/05/20 186 lb 3.2 oz (84.5 kg)  07/23/20 188 lb 9.6 oz (85.5 kg)  06/09/20 182 lb 6.4 oz (82.7 kg)     Health Maintenance Due  Topic Date Due  . Pneumococcal Vaccine 68-63 Years old (1 of 4 - PCV13) Never done  . HIV Screening  Never done  . Zoster Vaccines- Shingrix (1 of 2) 04/03/2007  . PAP SMEAR-Modifier  06/09/2017  . COVID-19 Vaccine (2 - Booster for Janssen series) 07/29/2019    There are no preventive care reminders to display for this patient.  Lab Results  Component Value Date   TSH 4.460 10/04/2019   Lab Results  Component Value Date   WBC 10.3 05/27/2020   HGB 13.6 05/27/2020   HCT 40.0 05/27/2020   MCV 88.8 05/27/2020   PLT 249 05/27/2020   Lab Results  Component Value Date   NA 143 05/27/2020   K 3.7 05/27/2020   CO2 25 05/27/2020   GLUCOSE 101 (H) 05/27/2020   BUN 18 05/27/2020   CREATININE 0.56 05/27/2020   BILITOT 0.4 03/25/2020   ALKPHOS 81 03/25/2020   AST 16 03/25/2020   ALT 14 03/25/2020   PROT 6.7 03/25/2020   ALBUMIN 4.4 03/25/2020   CALCIUM 9.3 05/27/2020   ANIONGAP 9 05/27/2020   GFR 133.89 12/26/2016   Lab Results  Component Value Date   CHOL 180 10/04/2019   Lab Results  Component Value Date   HDL 63 10/04/2019   Lab Results  Component Value Date   LDLCALC 106 (H) 10/04/2019   Lab Results  Component Value  Date   TRIG 57 10/04/2019   Lab Results  Component Value Date   CHOLHDL 2.9 10/04/2019   Lab Results  Component Value Date   HGBA1C 6.0 (H) 10/04/2019      Assessment & Plan:   Problem List Items Addressed This Visit      Cardiovascular and Mediastinum  Common migraine with intractable migraine - Primary   Relevant Medications   rizatriptan (MAXALT) 10 MG tablet      Meds ordered this encounter  Medications  . rizatriptan (MAXALT) 10 MG tablet    Sig: Take 1 tablet (10 mg total) by mouth as needed for migraine. May repeat in 2 hours if needed    Dispense:  10 tablet    Refill:  0    Order Specific Question:   Supervising Provider    Answer:   Carlota Raspberry, JEFFREY R [[id number]]    Follow-up: No follow-ups on file.   PLAN  Can try rizatriptan once daily prn  Will CC Dr. [name] to get his thoughts  Low threshold for ER visit  Refer to PT  Patient encouraged to call clinic with any questions, comments, or concerns.   Maximiano Coss, NP

## 2020-08-05 NOTE — Procedures (Signed)
    History:  Jasmine [name] is a 65 year old patient with a history of chronic neck and low back pain, fibromyalgia.  The patient has history of migraine headaches as well.  The patient went to the emergency room on 27 May 2020 with a 2-week history of increased fatigue and weakness sensation.  The patient's had some episodes of slurred speech with her headaches.  The patient had an episode of speech arrest without loss of consciousness, felt weak all over.  She is being evaluated for this event.  This is a routine EEG.  No skull defects noted.  Medications include Aimovig, Proventil, Ventolin, Celebrex, clonazepam, Catapres, Cymbalta, Plendil, Flonase, gabapentin, Cozaar, metoprolol, multivitamins, Prilosec, Norflex, Restoril, and Ambien.  EEG classification: Normal awake  Description of the recording: The background rhythms of this recording consists of a fairly well modulated medium amplitude alpha rhythm of 9 Hz that is reactive to eye opening and closure. As the record progresses, the patient appears to remain in the waking state throughout the recording. Photic stimulation was performed, resulting in a bilateral and symmetric photic driving response. Hyperventilation was also performed, resulting in a minimal buildup of the background rhythm activities without significant slowing seen. At no time during the recording does there appear to be evidence of spike or spike wave discharges or evidence of focal slowing. EKG monitor shows no evidence of cardiac rhythm abnormalities with a heart rate of 72.  Impression: This is a normal EEG recording in the waking state. No evidence of ictal or interictal discharges are seen.

## 2020-08-06 ENCOUNTER — Encounter: Payer: Self-pay | Admitting: Emergency Medicine

## 2020-08-11 ENCOUNTER — Telehealth: Payer: Self-pay | Admitting: Neurology

## 2020-08-11 ENCOUNTER — Other Ambulatory Visit: Payer: Self-pay

## 2020-08-11 ENCOUNTER — Ambulatory Visit (HOSPITAL_COMMUNITY)
Admission: RE | Admit: 2020-08-11 | Discharge: 2020-08-11 | Disposition: A | Discharge status: Home / Self Care | Payer: 59 | Source: Ambulatory Visit | Attending: Neurology | Admitting: Neurology

## 2020-08-11 DIAGNOSIS — R4789 Other speech disturbances: Secondary | ICD-10-CM | POA: Insufficient documentation

## 2020-08-11 NOTE — Progress Notes (Signed)
Carotid artery duplex completed. Refer to "CV Proc" under chart review to view preliminary results.  08/11/2020 1:37 PM Kelby Aline., MHA, RVT, RDCS, RDMS

## 2020-08-11 NOTE — Telephone Encounter (Signed)
  I tried to call patient, could not get an answer, unable to leave a message.  Carotid Doppler study is unremarkable.  Carotid doppler 08/11/20:  Summary: Right Carotid: Velocities in the right ICA are consistent with a 1-39% stenosis.   Left Carotid: Velocities in the left ICA are consistent with a 1-39% stenosis.   Vertebrals:  Bilateral vertebral arteries demonstrate antegrade flow. Subclavians: Normal flow hemodynamics were seen in bilateral subclavian              arteries.

## 2020-08-12 ENCOUNTER — Encounter: Payer: Self-pay | Admitting: Registered Nurse

## 2020-08-19 ENCOUNTER — Ambulatory Visit: Payer: 59 | Admitting: Neurology

## 2020-08-21 ENCOUNTER — Encounter: Payer: Self-pay | Admitting: Registered Nurse

## 2020-08-21 ENCOUNTER — Ambulatory Visit (INDEPENDENT_AMBULATORY_CARE_PROVIDER_SITE_OTHER): Payer: 59

## 2020-08-21 ENCOUNTER — Other Ambulatory Visit: Payer: 59

## 2020-08-21 ENCOUNTER — Other Ambulatory Visit: Payer: Self-pay

## 2020-08-21 ENCOUNTER — Ambulatory Visit (INDEPENDENT_AMBULATORY_CARE_PROVIDER_SITE_OTHER): Payer: 59 | Admitting: Registered Nurse

## 2020-08-21 VITALS — BP 160/79 | HR 79 | Temp 98.0°F | Resp 18 | Ht 64.0 in | Wt 181.2 lb

## 2020-08-21 DIAGNOSIS — M25572 Pain in left ankle and joints of left foot: Secondary | ICD-10-CM

## 2020-08-21 MED ORDER — DICLOFENAC SODIUM 1 % EX GEL: 2.0000 g | Freq: Four times a day (QID) | CUTANEOUS | 1 refills | Status: AC

## 2020-08-21 NOTE — Progress Notes (Signed)
Acute Office Visit  Subjective:    Patient ID: Jasmine [name], female    DOB: [birth year], 65 y.o.   MRN: [id number]  Chief Complaint  Patient presents with   Ankle Pain    Patient states she thinks [place] she states she has been having some swelling as well. Patient states she took some tylenol and gabapentin with no relief.    HPI Patient is in today for acute ankle pain  [place], had a mechanical trip Inversion injury of R ankle Has had swelling and tenderness, trouble with weight bearing, limited ROM d/t pain Has been taking tylenol and gabapentin without relief. Unfortunately cannot tolerate NSAIDs including celecoxib  No redness, swelling, or heat  Has been favoring L ankle, now having R ankle and R knee pain Notes hx of R meniscal tear without repair yet  Past Medical History:  Diagnosis Date   Achilles tendinosis of left lower extremity 01/23/2017   Anxiety    Asthma    Benign paroxysmal positional vertigo 01/05/2016   Bilateral sensorineural hearing loss 12/22/2014   Blepharitis of upper and lower eyelids of both eyes 04/27/2016   Bradycardia 09/22/2014   Cervicogenic headache 08/24/2016   Chronic high back pain    CKD (chronic kidney disease) 12/08/2014   Cochlear implant in place    Colon polyp    Common migraine with intractable migraine 02/19/2020   Constipation    Diverticulitis    Diverticulosis    DJD (degenerative joint disease)    Double vision    Dry eye syndrome of both lacrimal glands 04/27/2016   Fibromyalgia    Heart murmur    High cholesterol    Hypercalcemia    Hypercholesterolemia    Hyperlipidemia 02/01/2016   Hypertension    Hypoglycemia    Insomnia 09/22/2010   Kidney stone    Meniscus tear    Right   Migraine 08/07/2015   Mild intermittent asthma without complication 50/93/2671   Nephrolithiasis 03/17/2014   Nocturnal leg cramps 05/31/2019   Osteoporosis     Primary hyperparathyroidism ([place]) 12/10/2014   PTSD (post-traumatic stress disorder)    Renal cyst 05/12/2013   Schizophrenia ([place])    treated by Dr. Casimiro Needle   Schizophrenia [facility])    Seizures ([place])    last seizure 3 years ago, not on AED   Stroke ([place]) 07/08/11   "I've had mini strokes before; left side of face  is more down than right "   Syncope and collapse    One episode - 07/10/15   TIA (transient ischemic attack)     Past Surgical History:  Procedure Laterality Date   CATARACT EXTRACTION     COCHLEAR IMPLANT  2010   left   DILATION AND CURETTAGE OF UTERUS     HERNIA REPAIR     INGUINAL HERNIA REPAIR  1985   left   KIDNEY STONE SURGERY  ~ 2006   PARATHYROIDECTOMY     skin cancer removal     TONSILLECTOMY     "as a child"   tooth removal     WISDOM TOOTH EXTRACTION      Family History  Problem Relation Age of Onset   Heart attack Father 51       Living   Arthritis Father    Skin cancer Father    Hypertension Father    Hyperlipidemia Father    Leukemia Mother 41  Deceased   Alzheimer's disease Paternal Aunt        X2   Stomach cancer Paternal Aunt        x1   Arthritis/Rheumatoid Paternal Aunt        x1   Neuropathy Brother        Peripheal   Fibromyalgia Sister    Neuropathy Sister    HIV Sister    Fibromyalgia Sister    Neuropathy Sister    Arthritis/Rheumatoid Sister    Neuropathy Sister    Diabetes Paternal Grandmother    Thyroid disease Sister    Neuropathy Sister     Social History   Socioeconomic History   Marital status: Married    Spouse name: [name]   Number of children: 3   Years of education: 2 yrs coll   Highest education level: Not on file  Occupational History   Occupation: Disabled  Tobacco Use   Smoking status: Former    Packs/day: 1.00    Years: 3.00    Pack years: 3.00    Types: Cigarettes    Quit date: 02/29/2004    Years since quitting: 16.4   Smokeless tobacco: Never  Vaping Use   Vaping Use: Never used   Substance and Sexual Activity   Alcohol use: Yes    Alcohol/week: 0.0 standard drinks    Comment: "glass of wine q once in awhile; not very often; do it on special occasion"   Drug use: No   Sexual activity: Never  Other Topics Concern   Not on file  Social History Narrative   Lives at home with her husband.   Caffeine 6-7 cups caffeine daily   Left-handed.   Social Determinants of Health   Financial Resource Strain: Not on file  Food Insecurity: Not on file  Transportation Needs: Not on file  Physical Activity: Not on file  Stress: Not on file  Social Connections: Not on file  Intimate Partner Violence: Not on file    Outpatient Medications Prior to Visit  Medication Sig Dispense Refill   acetaminophen (TYLENOL) 500 MG tablet Take 500 mg by mouth every 8 (eight) hours as needed for moderate pain.     AIMOVIG 140 MG/ML SOAJ INJECT 140 MG UNDER THE SKIN ONCE EVERY 30 DAYS 1 mL 5   albuterol (PROVENTIL) (2.5 MG/3ML) 0.083% nebulizer solution Take 3 mLs (2.5 mg total) by nebulization every 6 (six) hours as needed for wheezing or shortness of breath. 75 mL 0   albuterol (VENTOLIN HFA) 108 (90 Base) MCG/ACT inhaler Inhale 2 puffs into the lungs every 6 (six) hours as needed for wheezing or shortness of breath. 6.7 g 2   aspirin 81 MG EC tablet Take 81 mg by mouth daily.     azelastine (ASTELIN) 0.1 % nasal spray Place 1 spray into both nostrils 2 (two) times daily. Use in each nostril as directed 30 mL 6   B COMPLEX VITAMINS PO Take by mouth.     clonazePAM (KLONOPIN) 1 MG tablet SMARTSIG:3 Tablet(s) By Mouth Every Other Day PRN     cloNIDine (CATAPRES) 0.1 MG tablet Take 1 tablet (0.1 mg total) by mouth as needed. Use as needed for blood pressure greater than 160/100 30 tablet 3   felodipine (PLENDIL) 10 MG 24 hr tablet Take 1 tablet (10 mg total) by mouth daily. 90 tablet 3   fluticasone (FLONASE) 50 MCG/ACT nasal spray USE TWO SPRAY(S) IN EACH NOSTRIL ONCE DAILY 16 g 5    Fluticasone-Salmeterol (  ADVAIR) 100-50 MCG/DOSE AEPB      gabapentin (NEURONTIN) 800 MG tablet Take 1 tablet (800 mg total) by mouth 3 (three) times daily. 270 tablet 1   LACTOBACILLUS RHAMNOSUS, GG, PO Take by mouth.      levalbuterol (XOPENEX HFA) 45 MCG/ACT inhaler Inhale 2 puffs into the lungs every 8 (eight) hours as needed for wheezing. 1 each 3   losartan (COZAAR) 100 MG tablet TAKE ONE TABLET BY MOUTH DAILY 90 tablet 1   metoprolol succinate (TOPROL-XL) 25 MG 24 hr tablet TAKE ONE TABLET BY MOUTH DAILY 90 tablet 1   Multiple Vitamin (MULTIVITAMIN PO) Take by mouth.     naproxen sodium (ANAPROX DS) 550 MG tablet Take 1 tablet (550 mg total) by mouth 2 (two) times daily with a meal. 30 tablet 3   omeprazole (PRILOSEC) 40 MG capsule Take 1 capsule (40 mg total) by mouth daily. 90 capsule 3   orphenadrine (NORFLEX) 100 MG tablet TAKE ONE TABLET BY MOUTH TWICE A DAY AS NEEDED FOR MUSCLE SPASMS 60 tablet 0   psyllium (METAMUCIL) 58.6 % packet Take 1 packet by mouth daily.     rizatriptan (MAXALT) 10 MG tablet Take 1 tablet (10 mg total) by mouth as needed for migraine. May repeat in 2 hours if needed 10 tablet 0   rosuvastatin (CRESTOR) 20 MG tablet TAKE ONE TABLET BY MOUTH EVERY EVENING 90 tablet 2   temazepam (RESTORIL) 15 MG capsule      zolpidem (AMBIEN) 10 MG tablet Take 2 tablets by mouth at bedtime.      No facility-administered medications prior to visit.    Allergies  Allergen Reactions   Amlodipine Shortness Of Breath    Pt states she also had very bad pain    Diclofenac Nausea And Vomiting and Nausea Only   Sulfa Antibiotics Hives and Rash   Famciclovir Other (See Comments)   Metronidazole Nausea And Vomiting   Soma [Carisoprodol] Other (See Comments)    Moody and sleepy Cognitive Function, Daytime sleepiness   Acyclovir    Acyclovir And Related    Benadryl [Diphenhydramine Hcl]     Severe emotional reaction and doesn't work well with Pt. Past history   Cephalosporins  Hives   Codeine Other (See Comments)    Makes patient feel odd ; "sometimes mild; sometimes severe reaction; mostly severe"   Cyclobenzaprine Other (See Comments)    Muscle Aches.   Cymbalta [Duloxetine Hcl]     Stomach upset   Diclofenac Sodium Nausea And Vomiting   Dicyclomine    Hydrocodone    Hydrocodone-Acetaminophen    Linzess [Linaclotide] Diarrhea   Nitrofurantoin Monohyd Macro     Felt funny   Suboxone [Buprenorphine Hcl-Naloxone Hcl] Nausea And Vomiting   Tizanidine     Other reaction(s): Other (See Comments) insomnia   Tramadol    Baclofen Nausea Only   Drug Ingredient [Ganciclovir] Rash   Meloxicam     Other reaction(s): Confusion   Sulfamethoxazole Rash    Review of Systems  Constitutional: Negative.   HENT: Negative.    Eyes: Negative.   Respiratory: Negative.    Cardiovascular: Negative.   Gastrointestinal: Negative.   Endocrine: Negative.   Genitourinary: Negative.   Musculoskeletal:  Positive for arthralgias.  Skin: Negative.   Neurological: Negative.   Psychiatric/Behavioral: Negative.    All other systems reviewed and are negative.     Objective:    Physical Exam Vitals and nursing note reviewed.  Constitutional:  General: She is not in acute distress.    Appearance: Normal appearance. She is not ill-appearing, toxic-appearing or diaphoretic.  Cardiovascular:     Rate and Rhythm: Normal rate and regular rhythm.     Pulses: Normal pulses.     Heart sounds: Normal heart sounds. No murmur heard.   No friction rub. No gallop.  Pulmonary:     Effort: Pulmonary effort is normal. No respiratory distress.     Breath sounds: Normal breath sounds. No stridor. No wheezing, rhonchi or rales.  Chest:     Chest wall: No tenderness.  Musculoskeletal:        General: Swelling, tenderness and signs of injury present. No deformity. Normal range of motion.     Right lower leg: No edema.     Left lower leg: No edema.     Comments: Positive squeeze  test L ankle Medial malleolus tenderness Trouble with weight bearing more than 1 week after injury  Skin:    General: Skin is warm and dry.     Capillary Refill: Capillary refill takes less than 2 seconds.  Neurological:     General: No focal deficit present.     Mental Status: She is alert and oriented to person, place, and time. Mental status is at baseline.  Psychiatric:        Mood and Affect: Mood normal.        Behavior: Behavior normal.        Thought Content: Thought content normal.        Judgment: Judgment normal.    BP (!) 160/79   Pulse 79   Temp 98 F (36.7 C) (Temporal)   Resp 18   Ht 5\' 4"  (1.626 m)   Wt 181 lb 3.2 oz (82.2 kg)   SpO2 97%   BMI 31.10 kg/m  Wt Readings from Last 3 Encounters:  08/21/20 181 lb 3.2 oz (82.2 kg)  08/05/20 186 lb 3.2 oz (84.5 kg)  07/23/20 188 lb 9.6 oz (85.5 kg)    There are no preventive care reminders to display for this patient.  There are no preventive care reminders to display for this patient.   Lab Results  Component Value Date   TSH 4.460 10/04/2019   Lab Results  Component Value Date   WBC 10.3 05/27/2020   HGB 13.6 05/27/2020   HCT 40.0 05/27/2020   MCV 88.8 05/27/2020   PLT 249 05/27/2020   Lab Results  Component Value Date   NA 143 05/27/2020   K 3.7 05/27/2020   CO2 25 05/27/2020   GLUCOSE 101 (H) 05/27/2020   BUN 18 05/27/2020   CREATININE 0.56 05/27/2020   BILITOT 0.4 03/25/2020   ALKPHOS 81 03/25/2020   AST 16 03/25/2020   ALT 14 03/25/2020   PROT 6.7 03/25/2020   ALBUMIN 4.4 03/25/2020   CALCIUM 9.3 05/27/2020   ANIONGAP 9 05/27/2020   GFR 133.89 12/26/2016   Lab Results  Component Value Date   CHOL 180 10/04/2019   Lab Results  Component Value Date   HDL 63 10/04/2019   Lab Results  Component Value Date   LDLCALC 106 (H) 10/04/2019   Lab Results  Component Value Date   TRIG 57 10/04/2019   Lab Results  Component Value Date   CHOLHDL 2.9 10/04/2019   Lab Results   Component Value Date   HGBA1C 6.0 (H) 10/04/2019       Assessment & Plan:   Problem List Items Addressed This Visit  None    No orders of the defined types were placed in this encounter.  PLAN Meets [place] criteria for Xray. Will obtain today.  Suggest topical diclofenac, rest, tylenol, and ok to use meds from pain management for breakthrough pain Patient encouraged to call clinic with any questions, comments, or concerns.   Maximiano Coss, NP

## 2020-08-21 NOTE — Patient Instructions (Addendum)
Jasmine [name] -  Jasmine [name] to see you, sorry about the circumstances.  Let's get an xray today at [facility] to use medication from pain management. Topical diclofenac can help as well. Ok to continue acetaminophen and aspirin.  I'll call with Xray results  Speak soon,  Jasmine [name]     If you have lab work done today you will be contacted with your lab results within the next 2 weeks.  If you have not heard from [place] then please contact us. The fastest way to get your results is to register for My Chart.   IF you received an x-ray today, you will receive an invoice from [facility]. Please contact [facility] at [phone] with questions or concerns regarding your invoice.   IF you received labwork today, you will receive an invoice from [facility]. Please contact [facility] at [phone] with questions or concerns regarding your invoice.   Our billing staff will not be able to assist you with questions regarding bills from these companies.  You will be contacted with the lab results as soon as they are available. The fastest way to get your results is to activate your My Chart account. Instructions are located on the last page of this paperwork. If you have not heard from [place] regarding the results in 2 weeks, please contact this office.

## 2020-09-04 ENCOUNTER — Encounter: Payer: Self-pay | Admitting: Registered Nurse

## 2020-09-04 ENCOUNTER — Telehealth: Payer: Self-pay | Admitting: Registered Nurse

## 2020-09-07 ENCOUNTER — Ambulatory Visit (INDEPENDENT_AMBULATORY_CARE_PROVIDER_SITE_OTHER): Payer: 59 | Admitting: Physical Therapy

## 2020-09-07 ENCOUNTER — Other Ambulatory Visit: Payer: Self-pay

## 2020-09-07 ENCOUNTER — Encounter: Payer: Self-pay | Admitting: Registered Nurse

## 2020-09-07 DIAGNOSIS — M25571 Pain in right ankle and joints of right foot: Secondary | ICD-10-CM | POA: Diagnosis not present

## 2020-09-07 DIAGNOSIS — M25572 Pain in left ankle and joints of left foot: Secondary | ICD-10-CM | POA: Diagnosis not present

## 2020-09-07 DIAGNOSIS — R2689 Other abnormalities of gait and mobility: Secondary | ICD-10-CM | POA: Diagnosis not present

## 2020-09-07 DIAGNOSIS — M542 Cervicalgia: Secondary | ICD-10-CM

## 2020-09-07 NOTE — Telephone Encounter (Signed)
Encounter started in error.

## 2020-09-08 ENCOUNTER — Encounter: Payer: Self-pay | Admitting: Registered Nurse

## 2020-09-08 ENCOUNTER — Ambulatory Visit (INDEPENDENT_AMBULATORY_CARE_PROVIDER_SITE_OTHER): Payer: 59 | Admitting: Registered Nurse

## 2020-09-08 VITALS — BP 141/68 | HR 80 | Temp 98.1°F | Resp 17 | Ht 63.5 in | Wt 171.8 lb

## 2020-09-08 DIAGNOSIS — G8929 Other chronic pain: Secondary | ICD-10-CM

## 2020-09-08 DIAGNOSIS — M25572 Pain in left ankle and joints of left foot: Secondary | ICD-10-CM

## 2020-09-08 DIAGNOSIS — F4310 Post-traumatic stress disorder, unspecified: Secondary | ICD-10-CM | POA: Insufficient documentation

## 2020-09-08 MED ORDER — PREDNISONE 10 MG PO TABS: ORAL_TABLET | ORAL | 0 refills | Status: AC

## 2020-09-08 NOTE — Patient Instructions (Addendum)
Jasmine [name] -   Good to see you - sorry you're still in pain!  Prednisone sent - take as directed   I will send referrals to pain management and podiatry in case pain persists or worsens  I will queue up sleep meds for when you run out   I will place referral for new psychiatrist to manage these medications  Thank you  Rich    If you have lab work done today you will be contacted with your lab results within the next 2 weeks.  If you have not heard from [place] then please contact us. The fastest way to get your results is to register for My Chart.   IF you received an x-ray today, you will receive an invoice from [facility]. Please contact [facility] at [phone] with questions or concerns regarding your invoice.   IF you received labwork today, you will receive an invoice from [place] [facility]. Please contact [facility] at [phone] with questions or concerns regarding your invoice.   Our billing staff will not be able to assist you with questions regarding bills from these companies.  You will be contacted with the lab results as soon as they are available. The fastest way to get your results is to activate your My Chart account. Instructions are located on the last page of this paperwork. If you have not heard from [place] regarding the results in 2 weeks, please contact this office.

## 2020-09-08 NOTE — Progress Notes (Signed)
Established Patient Office Visit  Subjective:  Patient ID: Jasmine [name], female    DOB: 11/10/1955  Age: 65 y.o. MRN: [id number]  CC:  Chief Complaint  Patient presents with   Follow-up    Patient states she is still having some pain oin both of her feet and the medication does not seem to work.    HPI Jasmine [name] presents for follow up  Seen two weeks previously for acute ankle pain Met [place] criteria for x-rays which were fortunately negative.   Had suggested topical diclofenac and RICE - not helpful, still in pain.  She has a number of medication intolerances and allergies - including tramadol, opioids, oral NSAIDs, and muscle relaxers including baclofen, soma, tizanidine, and others  Still some swelling around medial malleolus  Trouble with weightbearing  Past Medical History:  Diagnosis Date   Achilles tendinosis of left lower extremity 01/23/2017   Anxiety    Asthma    Benign paroxysmal positional vertigo 01/05/2016   Bilateral sensorineural hearing loss 12/22/2014   Blepharitis of upper and lower eyelids of both eyes 04/27/2016   Bradycardia 09/22/2014   Cervicogenic headache 08/24/2016   Chronic high back pain    CKD (chronic kidney disease) 12/08/2014   Cochlear implant in place    Colon polyp    Common migraine with intractable migraine 02/19/2020   Constipation    Diverticulitis    Diverticulosis    DJD (degenerative joint disease)    Double vision    Dry eye syndrome of both lacrimal glands 04/27/2016   Fibromyalgia    Heart murmur    High cholesterol    Hypercalcemia    Hypercholesterolemia    Hyperlipidemia 02/01/2016   Hypertension    Hypoglycemia    Insomnia 09/22/2010   Kidney stone    Meniscus tear    Right   Migraine 08/07/2015   Mild intermittent asthma without complication 16/96/7893   Nephrolithiasis 03/17/2014   Nocturnal leg cramps 05/31/2019   Osteoporosis    Primary hyperparathyroidism ([place]) 12/10/2014   PTSD (post-traumatic  stress disorder)    Renal cyst 05/12/2013   Schizophrenia ([place])    treated by Dr. Casimiro Needle   Schizophrenia [facility])    Seizures ([place])    last seizure 3 years ago, not on AED   Stroke ([place]) 07/08/11   "I've had mini strokes before; left side of face  is more down than right "   Syncope and collapse    One episode - 07/10/15   TIA (transient ischemic attack)     Past Surgical History:  Procedure Laterality Date   CATARACT EXTRACTION     COCHLEAR IMPLANT  2010   left   DILATION AND CURETTAGE OF UTERUS     HERNIA REPAIR     INGUINAL HERNIA REPAIR  1985   left   KIDNEY STONE SURGERY  ~ 2006   PARATHYROIDECTOMY     skin cancer removal     TONSILLECTOMY     "as a child"   tooth removal     WISDOM TOOTH EXTRACTION      Family History  Problem Relation Age of Onset   Heart attack Father 24       Living   Arthritis Father    Skin cancer Father    Hypertension Father    Hyperlipidemia Father    Leukemia Mother 41       Deceased   Alzheimer's disease Paternal Aunt        X28  Stomach cancer Paternal Aunt        x1   Arthritis/Rheumatoid Paternal Aunt        x1   Neuropathy Brother        Peripheal   Fibromyalgia Sister    Neuropathy Sister    HIV Sister    Fibromyalgia Sister    Neuropathy Sister    Arthritis/Rheumatoid Sister    Neuropathy Sister    Diabetes Paternal Grandmother    Thyroid disease Sister    Neuropathy Sister     Social History   Socioeconomic History   Marital status: Married    Spouse name: Jasmine [name]   Number of children: 3   Years of education: 2 yrs coll   Highest education level: Not on file  Occupational History   Occupation: Disabled  Tobacco Use   Smoking status: Former    Packs/day: 1.00    Years: 3.00    Pack years: 3.00    Types: Cigarettes    Quit date: 02/29/2004    Years since quitting: 16.5   Smokeless tobacco: Never  Vaping Use   Vaping Use: Never used  Substance and Sexual Activity   Alcohol use: Yes    Alcohol/week: 0.0  standard drinks    Comment: "glass of wine q once in awhile; not very often; do it on special occasion"   Drug use: No   Sexual activity: Never  Other Topics Concern   Not on file  Social History Narrative   Lives at home with her husband.   Caffeine 6-7 cups caffeine daily   Left-handed.   Social Determinants of Health   Financial Resource Strain: Not on file  Food Insecurity: Not on file  Transportation Needs: Not on file  Physical Activity: Not on file  Stress: Not on file  Social Connections: Not on file  Intimate Partner Violence: Not on file    Outpatient Medications Prior to Visit  Medication Sig Dispense Refill   acetaminophen (TYLENOL) 500 MG tablet Take 500 mg by mouth every 8 (eight) hours as needed for moderate pain.     AIMOVIG 140 MG/ML SOAJ INJECT 140 MG UNDER THE SKIN ONCE EVERY 30 DAYS 1 mL 5   aspirin 81 MG EC tablet Take 81 mg by mouth daily.     azelastine (ASTELIN) 0.1 % nasal spray Place 1 spray into both nostrils 2 (two) times daily. Use in each nostril as directed 30 mL 6   B COMPLEX VITAMINS PO Take by mouth.     cloNIDine (CATAPRES) 0.1 MG tablet Take 1 tablet (0.1 mg total) by mouth as needed. Use as needed for blood pressure greater than 160/100 30 tablet 3   diclofenac Sodium (VOLTAREN) 1 % GEL Apply 2 g topically 4 (four) times daily. 100 g 1   felodipine (PLENDIL) 10 MG 24 hr tablet Take 1 tablet (10 mg total) by mouth daily. 90 tablet 3   fluticasone (FLONASE) 50 MCG/ACT nasal spray USE TWO SPRAY(S) IN EACH NOSTRIL ONCE DAILY 16 g 5   Fluticasone-Salmeterol (ADVAIR) 100-50 MCG/DOSE AEPB      gabapentin (NEURONTIN) 800 MG tablet Take 1 tablet (800 mg total) by mouth 3 (three) times daily. 270 tablet 1   levalbuterol (XOPENEX HFA) 45 MCG/ACT inhaler Inhale 2 puffs into the lungs every 8 (eight) hours as needed for wheezing. 1 each 3   losartan (COZAAR) 100 MG tablet TAKE ONE TABLET BY MOUTH DAILY 90 tablet 1   metoprolol succinate (TOPROL-XL) 25 MG  24  hr tablet TAKE ONE TABLET BY MOUTH DAILY 90 tablet 1   Multiple Vitamin (MULTIVITAMIN PO) Take by mouth.     omeprazole (PRILOSEC) 40 MG capsule Take 1 capsule (40 mg total) by mouth daily. 90 capsule 3   orphenadrine (NORFLEX) 100 MG tablet TAKE ONE TABLET BY MOUTH TWICE A DAY AS NEEDED FOR MUSCLE SPASMS 60 tablet 0   rizatriptan (MAXALT) 10 MG tablet Take 1 tablet (10 mg total) by mouth as needed for migraine. May repeat in 2 hours if needed 10 tablet 0   rosuvastatin (CRESTOR) 20 MG tablet TAKE ONE TABLET BY MOUTH EVERY EVENING 90 tablet 2   temazepam (RESTORIL) 15 MG capsule      zolpidem (AMBIEN) 10 MG tablet Take 2 tablets by mouth at bedtime.      albuterol (PROVENTIL) (2.5 MG/3ML) 0.083% nebulizer solution Take 3 mLs (2.5 mg total) by nebulization every 6 (six) hours as needed for wheezing or shortness of breath. 75 mL 0   albuterol (VENTOLIN HFA) 108 (90 Base) MCG/ACT inhaler Inhale 2 puffs into the lungs every 6 (six) hours as needed for wheezing or shortness of breath. 6.7 g 2   clonazePAM (KLONOPIN) 1 MG tablet SMARTSIG:3 Tablet(s) By Mouth Every Other Day PRN     LACTOBACILLUS RHAMNOSUS, GG, PO Take by mouth.      naproxen sodium (ANAPROX DS) 550 MG tablet Take 1 tablet (550 mg total) by mouth 2 (two) times daily with a meal. 30 tablet 3   psyllium (METAMUCIL) 58.6 % packet Take 1 packet by mouth daily.     No facility-administered medications prior to visit.    Allergies  Allergen Reactions   Amlodipine Shortness Of Breath    Pt states she also had very bad pain    Diclofenac Nausea And Vomiting and Nausea Only   Sulfa Antibiotics Hives and Rash   Famciclovir Other (See Comments)   Metronidazole Nausea And Vomiting   Soma [Carisoprodol] Other (See Comments)    Moody and sleepy Cognitive Function, Daytime sleepiness   Acyclovir    Acyclovir And Related    Benadryl [Diphenhydramine Hcl]     Severe emotional reaction and doesn't work well with Pt. Past history    Cephalosporins Hives   Codeine Other (See Comments)    Makes patient feel odd ; "sometimes mild; sometimes severe reaction; mostly severe"   Cyclobenzaprine Other (See Comments)    Muscle Aches.   Cymbalta [Duloxetine Hcl]     Stomach upset   Diclofenac Sodium Nausea And Vomiting   Dicyclomine    Hydrocodone    Hydrocodone-Acetaminophen    Linzess [Linaclotide] Diarrhea   Nitrofurantoin Monohyd Macro     Felt funny   Suboxone [Buprenorphine Hcl-Naloxone Hcl] Nausea And Vomiting   Tizanidine     Other reaction(s): Other (See Comments) insomnia   Tramadol    Baclofen Nausea Only   Drug Ingredient [Ganciclovir] Rash   Meloxicam     Other reaction(s): Confusion   Sulfamethoxazole Rash    ROS Review of Systems  Constitutional: Negative.   HENT: Negative.    Eyes: Negative.   Respiratory: Negative.    Cardiovascular: Negative.   Gastrointestinal: Negative.   Genitourinary: Negative.   Musculoskeletal:  Positive for arthralgias. Negative for back pain, gait problem, joint swelling, myalgias, neck pain and neck stiffness.  Skin: Negative.   Neurological: Negative.   Psychiatric/Behavioral: Negative.    All other systems reviewed and are negative.    Objective:    Physical Exam  Vitals and nursing note reviewed.  Constitutional:      General: She is not in acute distress.    Appearance: Normal appearance. She is normal weight. She is not ill-appearing, toxic-appearing or diaphoretic.  Cardiovascular:     Rate and Rhythm: Normal rate and regular rhythm.     Heart sounds: Normal heart sounds. No murmur heard.   No friction rub. No gallop.  Pulmonary:     Effort: Pulmonary effort is normal. No respiratory distress.     Breath sounds: Normal breath sounds. No stridor. No wheezing, rhonchi or rales.  Chest:     Chest wall: No tenderness.  Musculoskeletal:        General: Swelling (L ankle) and tenderness (L ankle) present. No deformity or signs of injury.     Right lower  leg: No edema.     Left lower leg: No edema.  Skin:    General: Skin is warm and dry.  Neurological:     General: No focal deficit present.     Mental Status: She is alert and oriented to person, place, and time. Mental status is at baseline.  Psychiatric:        Mood and Affect: Mood normal.        Behavior: Behavior normal.        Thought Content: Thought content normal.        Judgment: Judgment normal.    BP (!) 141/68   Pulse 80   Temp 98.1 F (36.7 C) (Temporal)   Resp 17   Ht 5' 3.5" (1.613 m)   Wt 171 lb 12.8 oz (77.9 kg)   SpO2 98%   BMI 29.96 kg/m  Wt Readings from Last 3 Encounters:  09/08/20 171 lb 12.8 oz (77.9 kg)  08/21/20 181 lb 3.2 oz (82.2 kg)  08/05/20 186 lb 3.2 oz (84.5 kg)     There are no preventive care reminders to display for this patient.  There are no preventive care reminders to display for this patient.  Lab Results  Component Value Date   TSH 4.460 10/04/2019   Lab Results  Component Value Date   WBC 10.3 05/27/2020   HGB 13.6 05/27/2020   HCT 40.0 05/27/2020   MCV 88.8 05/27/2020   PLT 249 05/27/2020   Lab Results  Component Value Date   NA 143 05/27/2020   K 3.7 05/27/2020   CO2 25 05/27/2020   GLUCOSE 101 (H) 05/27/2020   BUN 18 05/27/2020   CREATININE 0.56 05/27/2020   BILITOT 0.4 03/25/2020   ALKPHOS 81 03/25/2020   AST 16 03/25/2020   ALT 14 03/25/2020   PROT 6.7 03/25/2020   ALBUMIN 4.4 03/25/2020   CALCIUM 9.3 05/27/2020   ANIONGAP 9 05/27/2020   GFR 133.89 12/26/2016   Lab Results  Component Value Date   CHOL 180 10/04/2019   Lab Results  Component Value Date   HDL 63 10/04/2019   Lab Results  Component Value Date   LDLCALC 106 (H) 10/04/2019   Lab Results  Component Value Date   TRIG 57 10/04/2019   Lab Results  Component Value Date   CHOLHDL 2.9 10/04/2019   Lab Results  Component Value Date   HGBA1C 6.0 (H) 10/04/2019      Assessment & Plan:   Problem List Items Addressed This Visit    None Visit Diagnoses     Acute left ankle pain    -  Primary   Relevant Medications   predniSONE (DELTASONE) 10  MG tablet   Other Relevant Orders   Ambulatory referral to Pain Clinic   Ambulatory referral to Podiatry   Other chronic pain       Relevant Medications   predniSONE (DELTASONE) 10 MG tablet   Other Relevant Orders   Ambulatory referral to Pain Clinic   Ambulatory referral to Podiatry       Meds ordered this encounter  Medications   predniSONE (DELTASONE) 10 MG tablet    Sig: Take 6 tablets (60 mg total) by mouth daily with breakfast for 2 days, THEN 5 tablets (50 mg total) daily with breakfast for 2 days, THEN 4 tablets (40 mg total) daily with breakfast for 2 days, THEN 3 tablets (30 mg total) daily with breakfast for 2 days, THEN 2 tablets (20 mg total) daily with breakfast for 2 days, THEN 1 tablet (10 mg total) daily with breakfast for 2 days.    Dispense:  42 tablet    Refill:  0    Order Specific Question:   Supervising Provider    Answer:   Carlota Raspberry, JEFFREY R [[id number]]    Follow-up: Return if symptoms worsen or fail to improve.   PLAN Prednisone taper for ankle pain  Refer to podiatry and pain clinic for ongoing management of ankle pain and other chronic pain Return if worsening or failing to improve Patient encouraged to call clinic with any questions, comments, or concerns.  Maximiano Coss, NP

## 2020-09-09 ENCOUNTER — Encounter: Payer: Self-pay | Admitting: Registered Nurse

## 2020-09-09 ENCOUNTER — Encounter: Payer: Self-pay | Admitting: Physical Therapy

## 2020-09-09 NOTE — Therapy (Addendum)
[facility] [address] [place], [place], [address] Phone: [phone]   Fax:  [phone]  Physical Therapy Evaluation  Patient Details  Name: Jasmine [name] MRN: [id number] Date of Birth: February 17, 1956 Referring Provider (PT): Maximiano Coss   Encounter Date: 09/07/2020   PT End of Session - 09/09/20 2124     Visit Number 1    Number of Visits 12    Date for PT Re-Evaluation 10/21/20    Authorization Type [employer]    PT Start Time 0930    PT Stop Time 1017    PT Time Calculation (min) 47 min    Equipment Utilized During Treatment Gait belt    Activity Tolerance Patient limited by pain    Behavior During Therapy [facility] for tasks assessed/performed             Past Medical History:  Diagnosis Date   Achilles tendinosis of left lower extremity 01/23/2017   Anxiety    Asthma    Benign paroxysmal positional vertigo 01/05/2016   Bilateral sensorineural hearing loss 12/22/2014   Blepharitis of upper and lower eyelids of both eyes 04/27/2016   Bradycardia 09/22/2014   Cervicogenic headache 08/24/2016   Chronic high back pain    CKD (chronic kidney disease) 12/08/2014   Cochlear implant in place    Colon polyp    Common migraine with intractable migraine 02/19/2020   Constipation    Diverticulitis    Diverticulosis    DJD (degenerative joint disease)    Double vision    Dry eye syndrome of both lacrimal glands 04/27/2016   Fibromyalgia    Heart murmur    High cholesterol    Hypercalcemia    Hypercholesterolemia    Hyperlipidemia 02/01/2016   Hypertension    Hypoglycemia    Insomnia 09/22/2010   Kidney stone    Meniscus tear    Right   Migraine 08/07/2015   Mild intermittent asthma without complication 48/25/0037   Nephrolithiasis 03/17/2014   Nocturnal leg cramps 05/31/2019   Osteoporosis    Primary hyperparathyroidism ([place]) 12/10/2014   PTSD (post-traumatic stress disorder)    Renal cyst 05/12/2013   Schizophrenia ([place])     treated by Dr. Casimiro Needle   Schizophrenia [facility])    Seizures ([place])    last seizure 3 years ago, not on AED   Stroke ([place]) 07/08/11   "I've had mini strokes before; left side of face  is more down than right "   Syncope and collapse    One episode - 07/10/15   TIA (transient ischemic attack)     Past Surgical History:  Procedure Laterality Date   CATARACT EXTRACTION     COCHLEAR IMPLANT  2010   left   DILATION AND CURETTAGE OF UTERUS     HERNIA REPAIR     INGUINAL HERNIA REPAIR  1985   left   KIDNEY STONE SURGERY  ~ 2006   PARATHYROIDECTOMY     skin cancer removal     TONSILLECTOMY     "as a child"   tooth removal     WISDOM TOOTH EXTRACTION      There were no vitals filed for this visit.        [facility] PT Assessment - 09/09/20 0001       Assessment   Medical Diagnosis Neck pain    Referring Provider (PT) Maximiano Coss      Precautions   Precautions Fall      Balance Screen  Has the patient fallen in the past 6 months No      Prior Function   Level of Independence Independent      Cognition   Overall Cognitive Status Within Functional Limits for tasks assessed      ROM / Strength   AROM / PROM / Strength AROM;Strength      AROM   AROM Assessment Site Cervical    Cervical Flexion wfl    Cervical Extension mild limitation    Cervical - Right Rotation 25    Cervical - Left Rotation 40      Strength   Overall Strength Comments UE: 4/5 gross      Palpation   Palpation comment Tightness and tenderness in R cervical musculature, UT and cervical paraspinals.  L foot: tenderness at medial ankle, into plantar fascia, no swelling in ankle      Special Tests   Other special tests Neg UE radicular pain,      Ambulation/Gait   Gait Comments 4 WW, CGA, 10 ft, significant antalgia, decreased heel strike and push off, forward flexed posture, decreased step height                        Objective measurements completed on examination: See above  findings.               PT Education - 09/09/20 2123     Education Details PT POC, Exam findings, Discussed recommendation for PT for ankle pain, and walking    Person(s) Educated Patient;Spouse    Methods Explanation;Verbal cues    Comprehension Verbalized understanding;Verbal cues required              PT Short Term Goals - 09/09/20 2156       PT SHORT TERM GOAL #1   Title Pt to be independent with initial HEP    Time 2    Status New    Target Date 09/21/20               PT Long Term Goals - 09/09/20 2156       PT LONG TERM GOAL #1   Title Pt to be independent with final HEP    Time 6    Period Weeks    Status New    Target Date 10/19/20      PT LONG TERM GOAL #2   Title Pt to report decreased pain in neck to 0-3/10    Time 6    Period Weeks    Status New    Target Date 10/19/20      PT LONG TERM GOAL #3   Title Pt to demo improved cervical ROM to be [facility] and symmetrical on L/R. , to improve ability for ADLS and IADLs.    Time 6    Period Weeks    Status New    Target Date 10/19/20      PT LONG TERM GOAL #4   Title Pt to demo improved tissue restrictions in cervical region to be WNL for decreased pain and mobility.    Time 6    Period Weeks    Status New    Target Date 10/19/20      PT LONG TERM GOAL #5   Title Ankle/ Gait goals to be written after further assessment next visit .                    Plan - 09/09/20 2139  Clinical Impression Statement Pt presents to PT with referral for Neck pain. Pt with stiffness and limited ROM in neck. She has bothersome pain and frequent headaches that effect ability for functional activity. She has increased muscle tension and tightenss in R cervial musculature. Pt also with complaints of signficant foot pain today, so feet screened as well. She has significant pain in bil feet/ankles L>R. She is wearing fig 8 ankle brace that she was given by Dr, she would like another one, discussed  getting over the counter at medical supply if needed. She is wearing a brace on each foot, strapped around forefoot, and shoes that are unbuckled(to fit braces). Both braces were donned correctly for pt, for increased ankle support, and discussed optimal footwear for safety, recommended she try to find larger sneaker to accomodate brace. Discussed safety concerns for walking and fall risk, with her current ability and current footwear. Ankle pain seems to be significantly effecting ability for walking and most functional activity at this time. She was able to walk short distance with 4WW, and husband pushing her on walker for longer distances.Recommended that we get referral for ankle pain and gait training as well, pt agrees, will discuss with Dr. Abbott Pao concerned with ongoing pain in ankles. She saw MD 2 weeks ago for this, discussed likely need for longer healing time for this, but pt feels pain is getting worse, recommended getting f/u with MD to discuss.    Personal Factors and Comorbidities Time since onset of injury/illness/exacerbation    Examination-Activity Limitations Squat;Stairs;Carry;Stand;Transfers;Lift;Locomotion Level    Examination-Participation Restrictions Cleaning;Community Activity;Shop;Meal Prep    Stability/Clinical Decision Making Evolving/Moderate complexity    Clinical Decision Making Moderate    Rehab Potential Good    PT Frequency 2x / week    PT Duration 6 weeks    PT Treatment/Interventions ADLs/Self Care Home Management;Cryotherapy;Electrical Stimulation;Iontophoresis 66m/ml Dexamethasone;Moist Heat;Balance training;Therapeutic exercise;Therapeutic activities;Functional mobility training;Stair training;Gait training;DME Instruction;Neuromuscular re-education;Patient/family education;Manual techniques;Vasopneumatic Device;Spinal Manipulations;Joint Manipulations;Passive range of motion;Energy conservation;Taping    Consulted and Agree with Plan of Care Patient              Patient will benefit from skilled therapeutic intervention in order to improve the following deficits and impairments:  Abnormal gait, Hypomobility, Pain, Decreased strength, Decreased activity tolerance, Decreased balance, Decreased mobility, Difficulty walking, Increased muscle spasms, Decreased range of motion, Improper body mechanics, Impaired flexibility, Decreased safety awareness  Visit Diagnosis: Cervicalgia  Pain in left ankle and joints of left foot  Pain in right ankle and joints of right foot  Other abnormalities of gait and mobility     Problem List Patient Active Problem List   Diagnosis Date Noted   Common migraine with intractable migraine 02/19/2020   Nocturnal leg cramps 05/31/2019   Anorgasmia of female 05/22/2018   Abnormal weight loss 05/22/2018   Degeneration of spine 05/22/2018   Depressive disorder 05/22/2018   Diverticulitis 05/22/2018   Achilles tendinosis of left lower extremity 01/23/2017   Cervicogenic headache 08/24/2016   Pain of toe of left foot 05/19/2016   Left shoulder pain 05/11/2016   Dry eye syndrome of both lacrimal glands 04/27/2016   Hyperlipidemia 02/01/2016   Benign paroxysmal positional vertigo 01/05/2016   Fibromyalgia 10/31/2015   Migraine 08/07/2015   Neck pain 08/07/2015   Myalgia and myositis 03/04/2015   Bilateral sensorineural hearing loss 12/22/2014   DJD (degenerative joint disease) of knee 12/09/2014   Essential hypertension 12/08/2014   Bradycardia 09/22/2014   Hypercalcemia 09/22/2014   Osteoporosis 12/23/2013  DJD (degenerative joint disease) 08/19/2013   Renal cyst 05/12/2013   Obesity 09/19/2012   Chronic low back pain 11/30/2011   Insomnia 09/22/2010   Perennial allergic rhinitis with seasonal variation 09/22/2010   Mild intermittent asthma without complication 45/85/9292   Hypercholesteremia 09/22/2010    Lyndee Hensen, PT, DPT 10:11 PM  09/09/20    [facility] [place], [place], [address] Phone: [phone]   Fax:  [phone]  Name: Jasmine [name] MRN: [id number] Date of Birth: [birth year]  PHYSICAL THERAPY DISCHARGE SUMMARY  Visits from Start of Care: 1 Plan: Patient agrees to discharge.  Patient goals were not met. Patient is being discharged due to - not returning since [place], PT, DPT 9:20 AM  02/26/21

## 2020-09-10 ENCOUNTER — Encounter: Payer: Self-pay | Admitting: Registered Nurse

## 2020-09-10 ENCOUNTER — Encounter: Payer: Self-pay | Admitting: Podiatry

## 2020-09-11 ENCOUNTER — Other Ambulatory Visit: Payer: Self-pay | Admitting: Registered Nurse

## 2020-09-11 ENCOUNTER — Telehealth: Payer: Self-pay | Admitting: Registered Nurse

## 2020-09-11 DIAGNOSIS — M545 Low back pain, unspecified: Secondary | ICD-10-CM

## 2020-09-11 DIAGNOSIS — G8929 Other chronic pain: Secondary | ICD-10-CM

## 2020-09-11 MED ORDER — ORPHENADRINE CITRATE ER 100 MG PO TB12: ORAL_TABLET | ORAL | 0 refills | Status: DC

## 2020-09-11 NOTE — Progress Notes (Signed)
Pt left med at home when traveling - sending rx to carry her over until she's home  Kathrin Ruddy, NP

## 2020-09-11 NOTE — Telephone Encounter (Signed)
Patient is away on a trip and forgot her generic for Norflex . She needs a few called in to [facility] on [place], [place]. Approximately  (6 to 8)

## 2020-09-14 ENCOUNTER — Encounter: Payer: 59 | Admitting: Physical Therapy

## 2020-09-15 ENCOUNTER — Ambulatory Visit: Payer: 59 | Admitting: Registered Nurse

## 2020-09-16 ENCOUNTER — Other Ambulatory Visit: Payer: Self-pay

## 2020-09-16 ENCOUNTER — Encounter: Payer: Self-pay | Admitting: Registered Nurse

## 2020-09-16 ENCOUNTER — Ambulatory Visit (INDEPENDENT_AMBULATORY_CARE_PROVIDER_SITE_OTHER): Payer: 59 | Admitting: Registered Nurse

## 2020-09-16 VITALS — BP 130/58 | HR 89 | Temp 98.2°F | Resp 18 | Ht 63.5 in | Wt 169.0 lb

## 2020-09-16 DIAGNOSIS — G609 Hereditary and idiopathic neuropathy, unspecified: Secondary | ICD-10-CM

## 2020-09-16 DIAGNOSIS — M2550 Pain in unspecified joint: Secondary | ICD-10-CM | POA: Diagnosis not present

## 2020-09-16 DIAGNOSIS — R7303 Prediabetes: Secondary | ICD-10-CM

## 2020-09-16 DIAGNOSIS — G629 Polyneuropathy, unspecified: Secondary | ICD-10-CM | POA: Insufficient documentation

## 2020-09-16 LAB — COMPREHENSIVE METABOLIC PANEL
ALT: 24 U/L (ref 0–35)
AST: 23 U/L (ref 0–37)
Albumin: 4.4 g/dL (ref 3.5–5.2)
Alkaline Phosphatase: 72 U/L (ref 39–117)
BUN: 23 mg/dL (ref 6–23)
CO2: 26 mEq/L (ref 19–32)
Calcium: 9.6 mg/dL (ref 8.4–10.5)
Chloride: 104 mEq/L (ref 96–112)
Creatinine, Ser: 0.67 mg/dL (ref 0.40–1.20)
GFR: 92.99 mL/min (ref >60.00)
Glucose, Bld: 93 mg/dL (ref 70–99)
Potassium: 3.8 mEq/L (ref 3.5–5.1)
Sodium: 141 mEq/L (ref 135–145)
Total Bilirubin: 0.5 mg/dL (ref 0.2–1.2)
Total Protein: 6.5 g/dL (ref 6.0–8.3)

## 2020-09-16 LAB — CBC WITH DIFFERENTIAL/PLATELET
Basophils Absolute: 0 10*3/uL (ref 0.0–0.1)
Basophils Relative: 0.4 % (ref 0.0–3.0)
Eosinophils Absolute: 0.1 10*3/uL (ref 0.0–0.7)
Eosinophils Relative: 1.1 % (ref 0.0–5.0)
HCT: 40 % (ref 36.0–46.0)
Hemoglobin: 13.3 g/dL (ref 12.0–15.0)
Lymphocytes Relative: 22.6 % (ref 12.0–46.0)
Lymphs Abs: 2.2 10*3/uL (ref 0.7–4.0)
MCHC: 33.3 g/dL (ref 30.0–36.0)
MCV: 86.7 fl (ref 78.0–100.0)
Monocytes Absolute: 0.5 10*3/uL (ref 0.1–1.0)
Monocytes Relative: 4.9 % (ref 3.0–12.0)
Neutro Abs: 7 10*3/uL (ref 1.4–7.7)
Neutrophils Relative %: 71 % (ref 43.0–77.0)
Platelets: 234 10*3/uL (ref 150.0–400.0)
RBC: 4.62 Mil/uL (ref 3.87–5.11)
RDW: 15.2 % (ref 11.5–15.5)
WBC: 9.9 10*3/uL (ref 4.0–10.5)

## 2020-09-16 LAB — URIC ACID: Uric Acid, Serum: 4.9 mg/dL (ref 2.4–7.0)

## 2020-09-16 LAB — HEMOGLOBIN A1C: Hgb A1c MFr Bld: 6.1 % (ref 4.6–6.5)

## 2020-09-16 MED ORDER — LIDOCAINE 5 % EX PTCH: 1.0000 | MEDICATED_PATCH | CUTANEOUS | 0 refills | Status: DC

## 2020-09-16 MED ORDER — CAPSAICIN 0.1 % EX CREA: 1.0000 "application " | TOPICAL_CREAM | Freq: Two times a day (BID) | CUTANEOUS | 4 refills | Status: DC | PRN

## 2020-09-16 NOTE — Patient Instructions (Addendum)
[name] -   Always a pleasure.  I will send lidocaine patches and capsaicin (/kp?se??s?n/) for topical use.  Remember - [facility] YOUR HANDS AFTER USING CAPSAICIN!  We'll also keep pushing on the pain management referral.  Some natural remedies that can help inflammation and pain include: Turmeric Cinnamon Ginger Meditation: check out books by Lorelee Cover, he's a leader in thinking on mindfulness meditation and the positive impacts it can have. I think the [employer] has a few of his books. Yoga: Check out Yoga With Adriene on YouTube ([email or web address]). She's very low intensity, friendly, approachable.   Labs today will be back tomorrow. I'll let you know if there are concerns! Checking on Uric Acid, Blood Counts, liver and kidney function, and sugars (last ones were borderline, in a "prediabetic range", but still fairly far from diabetes).   Thank you  Rich     If you have lab work done today you will be contacted with your lab results within the next 2 weeks.  If you have not heard from [place] then please contact us. The fastest way to get your results is to register for My Chart.   IF you received an x-ray today, you will receive an invoice from [facility]. Please contact [facility] at [phone] with questions or concerns regarding your invoice.   IF you received labwork today, you will receive an invoice from [facility]. Please contact [facility] at [phone] with questions or concerns regarding your invoice.   Our billing staff will not be able to assist you with questions regarding bills from these companies.  You will be contacted with the lab results as soon as they are available. The fastest way to get your results is to activate your My Chart account. Instructions are located on the last page of this paperwork. If you have not heard from [place] regarding the results in 2 weeks, please contact this office.

## 2020-09-16 NOTE — Progress Notes (Signed)
Acute Office Visit  Subjective:    Patient ID: Jasmine [name], female    DOB: Jul 26, 1955, 65 y.o.   MRN: [id number]  No chief complaint on file.   HPI Patient is in today for ongoing polyarticular pain Throughout ankles and knees Prednisone did provide some relief but interfered with sleep Has stopped taking this  Interested in other options, including refill of lidocaine patches for back pain and other natural or topical options as she has done poorly with nearly every PO analgesic available.  Also interested in Pain Management referral.  Pain has no new qualities  No hx of uric acid testing.  Past Medical History:  Diagnosis Date   Achilles tendinosis of left lower extremity 01/23/2017   Anxiety    Asthma    Benign paroxysmal positional vertigo 01/05/2016   Bilateral sensorineural hearing loss 12/22/2014   Blepharitis of upper and lower eyelids of both eyes 04/27/2016   Bradycardia 09/22/2014   Cervicogenic headache 08/24/2016   Chronic high back pain    CKD (chronic kidney disease) 12/08/2014   Cochlear implant in place    Colon polyp    Common migraine with intractable migraine 02/19/2020   Constipation    Diverticulitis    Diverticulosis    DJD (degenerative joint disease)    Double vision    Dry eye syndrome of both lacrimal glands 04/27/2016   Fibromyalgia    Heart murmur    High cholesterol    Hypercalcemia    Hypercholesterolemia    Hyperlipidemia 02/01/2016   Hypertension    Hypoglycemia    Insomnia 09/22/2010   Kidney stone    Meniscus tear    Right   Migraine 08/07/2015   Mild intermittent asthma without complication 53/29/9242   Nephrolithiasis 03/17/2014   Nocturnal leg cramps 05/31/2019   Osteoporosis    Primary hyperparathyroidism ([place]) 12/10/2014   PTSD (post-traumatic stress disorder)    Renal cyst 05/12/2013   Schizophrenia ([place])    treated by Dr. Casimiro Needle   Schizophrenia [facility])    Seizures ([place])    last seizure 3 years ago, not on AED    Stroke ([place]) 07/08/11   "I've had mini strokes before; left side of face  is more down than right "   Syncope and collapse    One episode - 07/10/15   TIA (transient ischemic attack)     Past Surgical History:  Procedure Laterality Date   CATARACT EXTRACTION     COCHLEAR IMPLANT  2010   left   DILATION AND CURETTAGE OF UTERUS     HERNIA REPAIR     INGUINAL HERNIA REPAIR  1985   left   KIDNEY STONE SURGERY  ~ 2006   PARATHYROIDECTOMY     skin cancer removal     TONSILLECTOMY     "as a child"   tooth removal     WISDOM TOOTH EXTRACTION      Family History  Problem Relation Age of Onset   Heart attack Father 27       Living   Arthritis Father    Skin cancer Father    Hypertension Father    Hyperlipidemia Father    Leukemia Mother 8       Deceased   Alzheimer's disease Paternal Aunt        X2   Stomach cancer Paternal Aunt        x1   Arthritis/Rheumatoid Paternal Aunt        x1   Neuropathy  Brother        Peripheal   Fibromyalgia Sister    Neuropathy Sister    HIV Sister    Fibromyalgia Sister    Neuropathy Sister    Arthritis/Rheumatoid Sister    Neuropathy Sister    Diabetes Paternal Grandmother    Thyroid disease Sister    Neuropathy Sister     Social History   Socioeconomic History   Marital status: Married    Spouse name: [name]   Number of children: 3   Years of education: 2 yrs coll   Highest education level: Not on file  Occupational History   Occupation: Disabled  Tobacco Use   Smoking status: Former    Packs/day: 1.00    Years: 3.00    Pack years: 3.00    Types: Cigarettes    Quit date: 02/29/2004    Years since quitting: 16.5   Smokeless tobacco: Never  Vaping Use   Vaping Use: Never used  Substance and Sexual Activity   Alcohol use: Yes    Alcohol/week: 0.0 standard drinks    Comment: "glass of wine q once in awhile; not very often; do it on special occasion"   Drug use: No   Sexual activity: Never  Other Topics Concern   Not  on file  Social History Narrative   Lives at home with her husband.   Caffeine 6-7 cups caffeine daily   Left-handed.   Social Determinants of Health   Financial Resource Strain: Not on file  Food Insecurity: Not on file  Transportation Needs: Not on file  Physical Activity: Not on file  Stress: Not on file  Social Connections: Not on file  Intimate Partner Violence: Not on file    Outpatient Medications Prior to Visit  Medication Sig Dispense Refill   acetaminophen (TYLENOL) 500 MG tablet Take 500 mg by mouth every 8 (eight) hours as needed for moderate pain.     AIMOVIG 140 MG/ML SOAJ INJECT 140 MG UNDER THE SKIN ONCE EVERY 30 DAYS 1 mL 5   aspirin 81 MG EC tablet Take 81 mg by mouth daily.     azelastine (ASTELIN) 0.1 % nasal spray Place 1 spray into both nostrils 2 (two) times daily. Use in each nostril as directed 30 mL 6   B COMPLEX VITAMINS PO Take by mouth.     cloNIDine (CATAPRES) 0.1 MG tablet Take 1 tablet (0.1 mg total) by mouth as needed. Use as needed for blood pressure greater than 160/100 30 tablet 3   diclofenac Sodium (VOLTAREN) 1 % GEL Apply 2 g topically 4 (four) times daily. 100 g 1   felodipine (PLENDIL) 10 MG 24 hr tablet Take 1 tablet (10 mg total) by mouth daily. 90 tablet 3   fluticasone (FLONASE) 50 MCG/ACT nasal spray USE TWO SPRAY(S) IN EACH NOSTRIL ONCE DAILY 16 g 5   Fluticasone-Salmeterol (ADVAIR) 100-50 MCG/DOSE AEPB      gabapentin (NEURONTIN) 800 MG tablet Take 1 tablet (800 mg total) by mouth 3 (three) times daily. 270 tablet 1   levalbuterol (XOPENEX HFA) 45 MCG/ACT inhaler Inhale 2 puffs into the lungs every 8 (eight) hours as needed for wheezing. 1 each 3   losartan (COZAAR) 100 MG tablet TAKE ONE TABLET BY MOUTH DAILY 90 tablet 1   metoprolol succinate (TOPROL-XL) 25 MG 24 hr tablet TAKE ONE TABLET BY MOUTH DAILY 90 tablet 1   Multiple Vitamin (MULTIVITAMIN PO) Take by mouth.     omeprazole (PRILOSEC) 40 MG capsule  Take 1 capsule (40 mg total)  by mouth daily. 90 capsule 3   orphenadrine (NORFLEX) 100 MG tablet TAKE ONE TABLET BY MOUTH TWICE A DAY AS NEEDED FOR MUSCLE SPASMS 20 tablet 0   predniSONE (DELTASONE) 10 MG tablet Take 6 tablets (60 mg total) by mouth daily with breakfast for 2 days, THEN 5 tablets (50 mg total) daily with breakfast for 2 days, THEN 4 tablets (40 mg total) daily with breakfast for 2 days, THEN 3 tablets (30 mg total) daily with breakfast for 2 days, THEN 2 tablets (20 mg total) daily with breakfast for 2 days, THEN 1 tablet (10 mg total) daily with breakfast for 2 days. 42 tablet 0   rizatriptan (MAXALT) 10 MG tablet Take 1 tablet (10 mg total) by mouth as needed for migraine. May repeat in 2 hours if needed 10 tablet 0   rosuvastatin (CRESTOR) 20 MG tablet TAKE ONE TABLET BY MOUTH EVERY EVENING 90 tablet 2   temazepam (RESTORIL) 15 MG capsule      zolpidem (AMBIEN) 10 MG tablet Take 2 tablets by mouth at bedtime.      No facility-administered medications prior to visit.    Allergies  Allergen Reactions   Amlodipine Shortness Of Breath    Pt states she also had very bad pain    Diclofenac Nausea And Vomiting and Nausea Only   Sulfa Antibiotics Hives and Rash   Famciclovir Other (See Comments)   Metronidazole Nausea And Vomiting   Soma [Carisoprodol] Other (See Comments)    Moody and sleepy Cognitive Function, Daytime sleepiness   Acyclovir    Acyclovir And Related    Benadryl [Diphenhydramine Hcl]     Severe emotional reaction and doesn't work well with Pt. Past history   Cephalosporins Hives   Codeine Other (See Comments)    Makes patient feel odd ; "sometimes mild; sometimes severe reaction; mostly severe"   Cyclobenzaprine Other (See Comments)    Muscle Aches.   Cymbalta [Duloxetine Hcl]     Stomach upset   Diclofenac Sodium Nausea And Vomiting   Dicyclomine    Hydrocodone    Hydrocodone-Acetaminophen    Linzess [Linaclotide] Diarrhea   Nitrofurantoin Monohyd Macro     Felt funny    Suboxone [Buprenorphine Hcl-Naloxone Hcl] Nausea And Vomiting   Tizanidine     Other reaction(s): Other (See Comments) insomnia   Tramadol    Baclofen Nausea Only   Drug Ingredient [Ganciclovir] Rash   Meloxicam     Other reaction(s): Confusion   Sulfamethoxazole Rash    Review of Systems  Constitutional: Negative.   HENT: Negative.    Eyes: Negative.   Respiratory: Negative.    Cardiovascular: Negative.   Gastrointestinal: Negative.   Genitourinary: Negative.   Musculoskeletal:  Positive for arthralgias and back pain. Negative for gait problem, joint swelling, myalgias, neck pain and neck stiffness.  Skin: Negative.   Neurological: Negative.   Psychiatric/Behavioral: Negative.    All other systems reviewed and are negative.     Objective:    Physical Exam Vitals and nursing note reviewed.  Constitutional:      General: She is not in acute distress.    Appearance: Normal appearance. She is not ill-appearing, toxic-appearing or diaphoretic.  Cardiovascular:     Rate and Rhythm: Normal rate and regular rhythm.     Pulses: Normal pulses.     Heart sounds: Normal heart sounds. No murmur heard.   No friction rub. No gallop.  Pulmonary:  Effort: Pulmonary effort is normal. No respiratory distress.     Breath sounds: Normal breath sounds. No stridor. No wheezing, rhonchi or rales.  Chest:     Chest wall: No tenderness.  Musculoskeletal:        General: Swelling and tenderness present. No deformity or signs of injury. Normal range of motion.     Right lower leg: No edema.     Left lower leg: No edema.     Comments: Swelling and tenderness at multiple joints, worst at L ankle and R knee  Skin:    General: Skin is warm and dry.     Capillary Refill: Capillary refill takes less than 2 seconds.  Neurological:     General: No focal deficit present.     Mental Status: She is alert and oriented to person, place, and time. Mental status is at baseline.  Psychiatric:         Mood and Affect: Mood normal.        Behavior: Behavior normal.        Thought Content: Thought content normal.        Judgment: Judgment normal.    BP (!) 130/58   Pulse 89   Temp 98.2 F (36.8 C) (Temporal)   Resp 18   Ht 5' 3.5" (1.613 m)   Wt 169 lb (76.7 kg)   SpO2 99%   BMI 29.47 kg/m  Wt Readings from Last 3 Encounters:  09/16/20 169 lb (76.7 kg)  09/08/20 171 lb 12.8 oz (77.9 kg)  08/21/20 181 lb 3.2 oz (82.2 kg)    Health Maintenance Due  Topic Date Due   PAP SMEAR-Modifier  06/09/2017    There are no preventive care reminders to display for this patient.   Lab Results  Component Value Date   TSH 4.460 10/04/2019   Lab Results  Component Value Date   WBC 9.9 09/16/2020   HGB 13.3 09/16/2020   HCT 40.0 09/16/2020   MCV 86.7 09/16/2020   PLT 234.0 09/16/2020   Lab Results  Component Value Date   NA 141 09/16/2020   K 3.8 09/16/2020   CO2 26 09/16/2020   GLUCOSE 93 09/16/2020   BUN 23 09/16/2020   CREATININE 0.67 09/16/2020   BILITOT 0.5 09/16/2020   ALKPHOS 72 09/16/2020   AST 23 09/16/2020   ALT 24 09/16/2020   PROT 6.5 09/16/2020   ALBUMIN 4.4 09/16/2020   CALCIUM 9.6 09/16/2020   ANIONGAP 9 05/27/2020   GFR 92.99 09/16/2020   Lab Results  Component Value Date   CHOL 180 10/04/2019   Lab Results  Component Value Date   HDL 63 10/04/2019   Lab Results  Component Value Date   LDLCALC 106 (H) 10/04/2019   Lab Results  Component Value Date   TRIG 57 10/04/2019   Lab Results  Component Value Date   CHOLHDL 2.9 10/04/2019   Lab Results  Component Value Date   HGBA1C 6.1 09/16/2020       Assessment & Plan:   Problem List Items Addressed This Visit       Nervous and Auditory   Peripheral neuropathy   Relevant Medications   lidocaine (LIDODERM) 5 %   Capsaicin 0.1 % CREA   Other Relevant Orders   Uric acid (Completed)   Other Visit Diagnoses     Arthralgia, unspecified joint    -  Primary   Relevant Medications    lidocaine (LIDODERM) 5 %   Capsaicin 0.1 % CREA  Other Relevant Orders   Uric acid (Completed)   Prediabetes       Relevant Orders   CBC with Differential/Platelet (Completed)   Comprehensive metabolic panel (Completed)   Hemoglobin A1c (Completed)   Uric acid (Completed)        Meds ordered this encounter  Medications   lidocaine (LIDODERM) 5 %    Sig: Place 1 patch onto the skin daily. Remove & Discard patch within 12 hours or as directed by MD    Dispense:  30 patch    Refill:  0    Order Specific Question:   Supervising Provider    Answer:   Carlota Raspberry, JEFFREY R [[id number]]   Capsaicin 0.1 % CREA    Sig: Apply 1 application topically 2 (two) times daily as needed.    Dispense:  56.6 g    Refill:  4    Order Specific Question:   Supervising Provider    Answer:   Carlota Raspberry, JEFFREY R [[id number]]   PLAN Refill lidocaine patches as above. If PA required should be fairly straight forward as she has a well documented history if oral analgesic intolerance. Try topical capsaicin. Discussed risks and benefits. Pt will thoroughly wash hands after use. We have placed a referral to pain management as I am not sure what else we can offer for pain in our office.  Patient encouraged to call clinic with any questions, comments, or concerns.  I spent 36 minutes with this patient discussing pathophysiology of pain, MOA of pain medications, and potential etiologies and treatment for her pain.   Maximiano Coss, NP

## 2020-09-17 ENCOUNTER — Encounter: Payer: Self-pay | Admitting: Registered Nurse

## 2020-09-18 ENCOUNTER — Encounter: Payer: Self-pay | Admitting: Registered Nurse

## 2020-09-21 ENCOUNTER — Encounter: Payer: 59 | Admitting: Physical Therapy

## 2020-09-21 ENCOUNTER — Other Ambulatory Visit: Payer: Self-pay

## 2020-09-21 ENCOUNTER — Telehealth: Payer: Self-pay | Admitting: Neurology

## 2020-09-21 ENCOUNTER — Telehealth: Payer: Self-pay

## 2020-09-21 DIAGNOSIS — G609 Hereditary and idiopathic neuropathy, unspecified: Secondary | ICD-10-CM

## 2020-09-21 DIAGNOSIS — M2550 Pain in unspecified joint: Secondary | ICD-10-CM

## 2020-09-21 MED ORDER — LIDOCAINE 5 % EX PTCH
1.0000 | MEDICATED_PATCH | CUTANEOUS | 3 refills | Status: DC
Start: 2020-09-21 — End: 2021-09-29

## 2020-09-21 NOTE — Telephone Encounter (Signed)
Pt is wanting a nurse to call her back the patches that was prescribed [employer] that is not enough to cover what she needs.  Capsaicin 0.1 % CREA  burning her skin   Pt said the diclofenac Sodium (VOLTAREN) 1 % GEL that worked some   [facility] [id number] - [place], [place]  [address], [place] [address]   Call back [phone]

## 2020-09-21 NOTE — Telephone Encounter (Signed)
Pt called, informing Dr. [name] Cymbalta makes me sick. Would like a call from the nurse.

## 2020-09-21 NOTE — Telephone Encounter (Signed)
Called patient and advised her cymbalta is not on her med list. She stated it is. I informed her Dr [name] spoke with her 07/23/20 and removed it from her list d/t reports of nausea, vomiting. She stated he pharmacy keeps refilling it. I advised will call pharmacy and tell them to d/c. Patient verbalized understanding, appreciation. Called pharmacy, spoke with Mickel Baas and informed her Dr [name] discontinued cymbalta 07/23/20. Pharmacist verbalized understanding, appreciation.

## 2020-09-21 NOTE — Telephone Encounter (Signed)
LFD 09/16/20 #30 with no refills LOV 09/16/20 NOV none  Spoke with patient, per patient she has 6 areas where she is using these patches daily. Patient states you already know. Requesting more.

## 2020-09-21 NOTE — Telephone Encounter (Signed)
Called patient to try and clarify, she states she just needs more patches because there are 6 areas that she is using them on daily. Per patient "He knows the areas already" Will also send as a prescription request.

## 2020-09-23 ENCOUNTER — Encounter: Payer: Self-pay | Admitting: Registered Nurse

## 2020-09-23 ENCOUNTER — Other Ambulatory Visit: Payer: Self-pay

## 2020-09-23 ENCOUNTER — Ambulatory Visit (INDEPENDENT_AMBULATORY_CARE_PROVIDER_SITE_OTHER): Payer: 59 | Admitting: Neurology

## 2020-09-23 DIAGNOSIS — G43009 Migraine without aura, not intractable, without status migrainosus: Secondary | ICD-10-CM

## 2020-09-23 MED ORDER — MECLIZINE HCL 25 MG PO TABS: 25.0000 mg | ORAL_TABLET | Freq: Three times a day (TID) | ORAL | 3 refills | Status: AC | PRN

## 2020-09-23 MED ORDER — ERENUMAB-AOOE 140 MG/ML ~~LOC~~ SOAJ
140.0000 mg | Freq: Once | SUBCUTANEOUS | Status: AC
  Administered 2020-09-23: 140 mg via SUBCUTANEOUS

## 2020-09-23 NOTE — Telephone Encounter (Signed)
Pt came today for an injection visit. Pt requested a refill on Meclizine 25 mg PRN for dizziness. Sts she would like to for this to be called into the [facility] in [place]. Meclizine is not active on the pt's chart, will ask Dr. [name] if ok to refill.

## 2020-09-23 NOTE — Progress Notes (Signed)
Pt came to the office today for Aimovig injection. Pt brought medication with her sample from office was not used. Pt mentioned she has tried to inject twice at home now and has had trouble each time. Pt sts her hands are weak and cannot hold the injection in place. Pt sts she plans to report to our office monthly for her injetions now.  Injection was given in the posterior aspect of the left arm, pt tolerated well. During the injection visit, pt requested a refill on Meclizine 25 mg PRN for dizziness, pt claims this was prescribed in the past by Dr. [name], not active on med list.  Will send message to provider asking if ok to refill.

## 2020-09-28 ENCOUNTER — Encounter: Payer: 59 | Admitting: Physical Therapy

## 2020-09-28 ENCOUNTER — Encounter: Payer: Self-pay | Admitting: Registered Nurse

## 2020-09-30 ENCOUNTER — Encounter (HOSPITAL_COMMUNITY): Payer: Self-pay

## 2020-09-30 ENCOUNTER — Emergency Department (HOSPITAL_COMMUNITY)
Admission: EM | Admit: 2020-09-30 | Discharge: 2020-09-30 | Disposition: A | Discharge status: Home / Self Care | Payer: 59 | Attending: Emergency Medicine | Admitting: Emergency Medicine

## 2020-09-30 ENCOUNTER — Other Ambulatory Visit: Payer: Self-pay

## 2020-09-30 DIAGNOSIS — M791 Myalgia, unspecified site: Secondary | ICD-10-CM | POA: Diagnosis not present

## 2020-09-30 DIAGNOSIS — Z5321 Procedure and treatment not carried out due to patient leaving prior to being seen by health care provider: Secondary | ICD-10-CM | POA: Diagnosis not present

## 2020-09-30 NOTE — ED Triage Notes (Signed)
Pt BIB EMS from home. Pt endorses generalized nerve pain after a fall this morning. Pt was seen at pain clinic on Wednesday.

## 2020-09-30 NOTE — ED Notes (Signed)
Pt left with family. Pt seen standing and getting into car.

## 2020-10-06 ENCOUNTER — Other Ambulatory Visit: Payer: Self-pay | Admitting: Registered Nurse

## 2020-10-06 ENCOUNTER — Ambulatory Visit (INDEPENDENT_AMBULATORY_CARE_PROVIDER_SITE_OTHER): Payer: 59 | Admitting: Registered Nurse

## 2020-10-06 ENCOUNTER — Encounter: Payer: Self-pay | Admitting: Registered Nurse

## 2020-10-06 ENCOUNTER — Other Ambulatory Visit: Payer: Self-pay

## 2020-10-06 VITALS — BP 170/97 | HR 84 | Resp 18 | Ht 63.5 in

## 2020-10-06 DIAGNOSIS — M546 Pain in thoracic spine: Secondary | ICD-10-CM

## 2020-10-06 DIAGNOSIS — G43019 Migraine without aura, intractable, without status migrainosus: Secondary | ICD-10-CM

## 2020-10-06 DIAGNOSIS — M545 Low back pain, unspecified: Secondary | ICD-10-CM

## 2020-10-06 DIAGNOSIS — G8929 Other chronic pain: Secondary | ICD-10-CM

## 2020-10-06 MED ORDER — KETOROLAC TROMETHAMINE 60 MG/2ML IM SOLN
60.0000 mg | Freq: Once | INTRAMUSCULAR | Status: AC
  Administered 2020-10-06: 60 mg via INTRAMUSCULAR

## 2020-10-06 MED ORDER — METHYLPREDNISOLONE ACETATE 80 MG/ML IJ SUSP
80.0000 mg | Freq: Once | INTRAMUSCULAR | Status: AC
  Administered 2020-10-06: 80 mg via INTRAMUSCULAR

## 2020-10-06 MED ORDER — RIZATRIPTAN BENZOATE 10 MG PO TABS: 10.0000 mg | ORAL_TABLET | ORAL | 0 refills | Status: DC | PRN

## 2020-10-06 NOTE — Progress Notes (Signed)
Acute Office Visit  Subjective:    Patient ID: Jasmine [name], female    DOB: October 24, 1955, 65 y.o.   MRN: [id number]  Chief Complaint  Patient presents with   Hospitalization Follow-up    Patient states she is here for a hospital follow up    HPI Patient is in today for hospital follow up  Seen in ER last week for acute back pain that started during yard work Mid back - t spine, midline and bilateral. Radiates towards lumbar, sciatic pain to lower legs Has chronic back pain but this is far worse. Endorses "100/10 pain".   In ER given IM toradol with some relief Has been taking leftover oxycodone despite AE at home  Called Dr. [name], who recommended lyrica in place of gabapentin  Does need refill on Maxalt, has been on for some time, no issues.   Interested in pursuing pain management and PT  Past Medical History:  Diagnosis Date   Achilles tendinosis of left lower extremity 01/23/2017   Anxiety    Asthma    Benign paroxysmal positional vertigo 01/05/2016   Bilateral sensorineural hearing loss 12/22/2014   Blepharitis of upper and lower eyelids of both eyes 04/27/2016   Bradycardia 09/22/2014   Cervicogenic headache 08/24/2016   Chronic high back pain    CKD (chronic kidney disease) 12/08/2014   Cochlear implant in place    Colon polyp    Common migraine with intractable migraine 02/19/2020   Constipation    Diverticulitis    Diverticulosis    DJD (degenerative joint disease)    Double vision    Dry eye syndrome of both lacrimal glands 04/27/2016   Fibromyalgia    Heart murmur    High cholesterol    Hypercalcemia    Hypercholesterolemia    Hyperlipidemia 02/01/2016   Hypertension    Hypoglycemia    Insomnia 09/22/2010   Kidney stone    Meniscus tear    Right   Migraine 08/07/2015   Mild intermittent asthma without complication 99991111   Nephrolithiasis 03/17/2014   Nocturnal leg cramps 05/31/2019   Osteoporosis    Primary hyperparathyroidism ([place])  12/10/2014   PTSD (post-traumatic stress disorder)    Renal cyst 05/12/2013   Schizophrenia ([place])    treated by Dr. Casimiro Needle   Schizophrenia [facility])    Seizures ([place])    last seizure 3 years ago, not on AED   Stroke ([place]) 07/08/11   "I've had mini strokes before; left side of face  is more down than right "   Syncope and collapse    One episode - 07/10/15   TIA (transient ischemic attack)     Past Surgical History:  Procedure Laterality Date   CATARACT EXTRACTION     COCHLEAR IMPLANT  2010   left   DILATION AND CURETTAGE OF UTERUS     HERNIA REPAIR     INGUINAL HERNIA REPAIR  1985   left   KIDNEY STONE SURGERY  ~ 2006   PARATHYROIDECTOMY     skin cancer removal     TONSILLECTOMY     "as a child"   tooth removal     WISDOM TOOTH EXTRACTION      Family History  Problem Relation Age of Onset   Heart attack Father 54       Living   Arthritis Father    Skin cancer Father    Hypertension Father    Hyperlipidemia Father    Leukemia Mother 27  Deceased   Alzheimer's disease Paternal Aunt        X2   Stomach cancer Paternal Aunt        x1   Arthritis/Rheumatoid Paternal Aunt        x1   Neuropathy Brother        Peripheal   Fibromyalgia Sister    Neuropathy Sister    HIV Sister    Fibromyalgia Sister    Neuropathy Sister    Arthritis/Rheumatoid Sister    Neuropathy Sister    Diabetes Paternal Grandmother    Thyroid disease Sister    Neuropathy Sister     Social History   Socioeconomic History   Marital status: Married    Spouse name: [name]   Number of children: 3   Years of education: 2 yrs coll   Highest education level: Not on file  Occupational History   Occupation: Disabled  Tobacco Use   Smoking status: Former    Packs/day: 1.00    Years: 3.00    Pack years: 3.00    Types: Cigarettes    Quit date: 02/29/2004    Years since quitting: 16.6   Smokeless tobacco: Never  Vaping Use   Vaping Use: Never used  Substance and Sexual Activity    Alcohol use: Yes    Alcohol/week: 0.0 standard drinks    Comment: "glass of wine q once in awhile; not very often; do it on special occasion"   Drug use: No   Sexual activity: Never  Other Topics Concern   Not on file  Social History Narrative   Lives at home with her husband.   Caffeine 6-7 cups caffeine daily   Left-handed.   Social Determinants of Health   Financial Resource Strain: Not on file  Food Insecurity: Not on file  Transportation Needs: Not on file  Physical Activity: Not on file  Stress: Not on file  Social Connections: Not on file  Intimate Partner Violence: Not on file    Outpatient Medications Prior to Visit  Medication Sig Dispense Refill   acetaminophen (TYLENOL) 500 MG tablet Take 500 mg by mouth every 8 (eight) hours as needed for moderate pain.     AIMOVIG 140 MG/ML SOAJ INJECT 140 MG UNDER THE SKIN ONCE EVERY 30 DAYS 1 mL 5   aspirin 81 MG EC tablet Take 81 mg by mouth daily.     azelastine (ASTELIN) 0.1 % nasal spray Place 1 spray into both nostrils 2 (two) times daily. Use in each nostril as directed 30 mL 6   B COMPLEX VITAMINS PO Take by mouth.     Capsaicin 0.1 % CREA Apply 1 application topically 2 (two) times daily as needed. 56.6 g 4   cloNIDine (CATAPRES) 0.1 MG tablet Take 1 tablet (0.1 mg total) by mouth as needed. Use as needed for blood pressure greater than 160/100 30 tablet 3   diclofenac Sodium (VOLTAREN) 1 % GEL Apply 2 g topically 4 (four) times daily. 100 g 1   felodipine (PLENDIL) 10 MG 24 hr tablet Take 1 tablet (10 mg total) by mouth daily. 90 tablet 3   fluticasone (FLONASE) 50 MCG/ACT nasal spray USE TWO SPRAY(S) IN EACH NOSTRIL ONCE DAILY 16 g 5   Fluticasone-Salmeterol (ADVAIR) 100-50 MCG/DOSE AEPB      levalbuterol (XOPENEX HFA) 45 MCG/ACT inhaler Inhale 2 puffs into the lungs every 8 (eight) hours as needed for wheezing. 1 each 3   lidocaine (LIDODERM) 5 % Place 1 patch onto the  skin daily. Remove & Discard patch within 12 hours  or as directed by MD 180 patch 3   meclizine (ANTIVERT) 25 MG tablet Take 1 tablet (25 mg total) by mouth 3 (three) times daily as needed for dizziness. 30 tablet 3   metoprolol succinate (TOPROL-XL) 25 MG 24 hr tablet TAKE ONE TABLET BY MOUTH DAILY 90 tablet 1   Multiple Vitamin (MULTIVITAMIN PO) Take by mouth.     omeprazole (PRILOSEC) 40 MG capsule Take 1 capsule (40 mg total) by mouth daily. 90 capsule 3   rosuvastatin (CRESTOR) 20 MG tablet TAKE ONE TABLET BY MOUTH EVERY EVENING 90 tablet 2   temazepam (RESTORIL) 15 MG capsule      zolpidem (AMBIEN) 10 MG tablet Take 2 tablets by mouth at bedtime.      gabapentin (NEURONTIN) 800 MG tablet Take 1 tablet (800 mg total) by mouth 3 (three) times daily. 270 tablet 1   losartan (COZAAR) 100 MG tablet TAKE ONE TABLET BY MOUTH DAILY 90 tablet 1   orphenadrine (NORFLEX) 100 MG tablet TAKE ONE TABLET BY MOUTH TWICE A DAY AS NEEDED FOR MUSCLE SPASMS 20 tablet 0   rizatriptan (MAXALT) 10 MG tablet Take 1 tablet (10 mg total) by mouth as needed for migraine. May repeat in 2 hours if needed 10 tablet 0   No facility-administered medications prior to visit.    Allergies  Allergen Reactions   Amlodipine Shortness Of Breath    Pt states she also had very bad pain    Diclofenac Nausea And Vomiting and Nausea Only   Sulfa Antibiotics Hives and Rash   Famciclovir Other (See Comments)   Metronidazole Nausea And Vomiting   Soma [Carisoprodol] Other (See Comments)    Moody and sleepy Cognitive Function, Daytime sleepiness   Acyclovir    Acyclovir And Related    Benadryl [Diphenhydramine Hcl]     Severe emotional reaction and doesn't work well with Pt. Past history   Cephalosporins Hives   Codeine Other (See Comments)    Makes patient feel odd ; "sometimes mild; sometimes severe reaction; mostly severe"   Cyclobenzaprine Other (See Comments)    Muscle Aches.   Cymbalta [Duloxetine Hcl]     Stomach upset   Diclofenac Sodium Nausea And Vomiting    Dicyclomine    Hydrocodone    Hydrocodone-Acetaminophen    Linzess [Linaclotide] Diarrhea   Nitrofurantoin Monohyd Macro     Felt funny   Suboxone [Buprenorphine Hcl-Naloxone Hcl] Nausea And Vomiting   Tizanidine     Other reaction(s): Other (See Comments) insomnia   Tramadol    Baclofen Nausea Only   Drug Ingredient [Ganciclovir] Rash   Meloxicam     Other reaction(s): Confusion   Sulfamethoxazole Rash    Review of Systems     Objective:    Physical Exam  BP (!) 170/97   Pulse 84   Resp 18   Ht 5' 3.5" (1.613 m)   SpO2 98%   BMI 29.47 kg/m  Wt Readings from Last 3 Encounters:  09/16/20 169 lb (76.7 kg)  09/08/20 171 lb 12.8 oz (77.9 kg)  08/21/20 181 lb 3.2 oz (82.2 kg)    Health Maintenance Due  Topic Date Due   PAP SMEAR-Modifier  06/09/2017   INFLUENZA VACCINE  09/28/2020    There are no preventive care reminders to display for this patient.   Lab Results  Component Value Date   TSH 4.460 10/04/2019   Lab Results  Component Value Date  WBC 9.9 09/16/2020   HGB 13.3 09/16/2020   HCT 40.0 09/16/2020   MCV 86.7 09/16/2020   PLT 234.0 09/16/2020   Lab Results  Component Value Date   NA 141 09/16/2020   K 3.8 09/16/2020   CO2 26 09/16/2020   GLUCOSE 93 09/16/2020   BUN 23 09/16/2020   CREATININE 0.67 09/16/2020   BILITOT 0.5 09/16/2020   ALKPHOS 72 09/16/2020   AST 23 09/16/2020   ALT 24 09/16/2020   PROT 6.5 09/16/2020   ALBUMIN 4.4 09/16/2020   CALCIUM 9.6 09/16/2020   ANIONGAP 9 05/27/2020   GFR 92.99 09/16/2020   Lab Results  Component Value Date   CHOL 180 10/04/2019   Lab Results  Component Value Date   HDL 63 10/04/2019   Lab Results  Component Value Date   LDLCALC 106 (H) 10/04/2019   Lab Results  Component Value Date   TRIG 57 10/04/2019   Lab Results  Component Value Date   CHOLHDL 2.9 10/04/2019   Lab Results  Component Value Date   HGBA1C 6.1 09/16/2020       Assessment & Plan:   Problem List Items  Addressed This Visit       Cardiovascular and Mediastinum   Common migraine with intractable migraine   Other Visit Diagnoses     Acute midline thoracic back pain    -  Primary   Relevant Medications   ketorolac (TORADOL) injection 60 mg (Completed)   methylPREDNISolone acetate (DEPO-MEDROL) injection 80 mg (Completed)   Other Relevant Orders   Ambulatory referral to Physical Therapy   MR THORACIC SPINE W WO CONTRAST   MR Lumbar Spine W Wo Contrast        Meds ordered this encounter  Medications   DISCONTD: rizatriptan (MAXALT) 10 MG tablet    Sig: Take 1 tablet (10 mg total) by mouth as needed for migraine. May repeat in 2 hours if needed    Dispense:  10 tablet    Refill:  0    Order Specific Question:   Supervising Provider    Answer:   Carlota Raspberry, JEFFREY R [[id number]]   ketorolac (TORADOL) injection 60 mg   methylPREDNISolone acetate (DEPO-MEDROL) injection 80 mg   PLAN Msk pain with radicular aspect IM treatment as above Will pursue MRI lower back Will likely need higher level of care than I can provide - her history of multiple allergies and intolerances to analgesics presents a difficult case. Refill maxalt as above Patient encouraged to call clinic with any questions, comments, or concerns.   Maximiano Coss, NP

## 2020-10-06 NOTE — Patient Instructions (Addendum)
Jasmine [name] -  Great news  - Dr. Gillis Santa is interested in meeting you to discuss managing chronic pain including fibromyalgia, back pain, foot pain, and the chronic joint pain you are having. His phone number is: [phone]  I am continuing to get PT set up. Looks like podiatry has tried to call and left voicemail  Today have given toradol IM injection and depo medrol (antiinflammatory and steroid, respectively)  Thank you  Rich     If you have lab work done today you will be contacted with your lab results within the next 2 weeks.  If you have not heard from [place] then please contact us. The fastest way to get your results is to register for My Chart.   IF you received an x-ray today, you will receive an invoice from [facility]. Please contact [facility] at [phone] with questions or concerns regarding your invoice.   IF you received labwork today, you will receive an invoice from [place]. Please contact [facility] at [phone] with questions or concerns regarding your invoice.   Our billing staff will not be able to assist you with questions regarding bills from these companies.  You will be contacted with the lab results as soon as they are available. The fastest way to get your results is to activate your My Chart account. Instructions are located on the last page of this paperwork. If you have not heard from [place] regarding the results in 2 weeks, please contact this office.

## 2020-10-07 ENCOUNTER — Other Ambulatory Visit: Payer: Self-pay | Admitting: Registered Nurse

## 2020-10-07 DIAGNOSIS — I10 Essential (primary) hypertension: Secondary | ICD-10-CM

## 2020-10-08 ENCOUNTER — Other Ambulatory Visit: Payer: Self-pay | Admitting: Neurology

## 2020-10-08 ENCOUNTER — Encounter: Payer: Self-pay | Admitting: Registered Nurse

## 2020-10-08 MED ORDER — PREGABALIN 200 MG PO CAPS: 200.0000 mg | ORAL_CAPSULE | Freq: Three times a day (TID) | ORAL | 3 refills | Status: DC

## 2020-10-08 NOTE — Progress Notes (Signed)
Patient currently is on gabapentin 800 mg 3 times daily, she will be switched to Lyrica 200 mg 3 times daily to see if this is more effective for her pain control.

## 2020-10-08 NOTE — Telephone Encounter (Signed)
Linford Arnold [id number] - Physical Therapy - [facility] - this is where referral for PT needs to be sent.

## 2020-10-08 NOTE — Telephone Encounter (Signed)
Please Advise

## 2020-10-09 ENCOUNTER — Ambulatory Visit (INDEPENDENT_AMBULATORY_CARE_PROVIDER_SITE_OTHER): Payer: 59 | Admitting: Podiatry

## 2020-10-09 ENCOUNTER — Other Ambulatory Visit: Payer: Self-pay

## 2020-10-09 ENCOUNTER — Encounter: Payer: Self-pay | Admitting: Podiatry

## 2020-10-09 ENCOUNTER — Encounter: Payer: Self-pay | Admitting: Registered Nurse

## 2020-10-09 DIAGNOSIS — M25572 Pain in left ankle and joints of left foot: Secondary | ICD-10-CM

## 2020-10-09 DIAGNOSIS — G8929 Other chronic pain: Secondary | ICD-10-CM | POA: Diagnosis not present

## 2020-10-09 DIAGNOSIS — M1000 Idiopathic gout, unspecified site: Secondary | ICD-10-CM

## 2020-10-09 DIAGNOSIS — S96912S Strain of unspecified muscle and tendon at ankle and foot level, left foot, sequela: Secondary | ICD-10-CM | POA: Diagnosis not present

## 2020-10-09 LAB — SEDIMENTATION RATE: Sed Rate: 2 mm/h (ref 0–30)

## 2020-10-09 NOTE — Progress Notes (Signed)
Subjective:   Patient ID: Jasmine [name], female   DOB: 65 y.o.   MRN: [id number]   HPI 65 year old female presents the office today for concerns of left ankle pain which started on Father's Day 2022.  She states she was on a trail and she twisted her foot on mulch.  She had some pain immediately but the pain worsened and never improved.  She previously had an x-ray performed which was negative.  She thinks that she may have gout.  She never had gout but states that she is a family history of this.  She is tried Biofreeze patches.  She previous had blood work and uric acid levels normal.  She has neuropathic pain bilaterally which has been ongoing and not new.   Review of Systems  All other systems reviewed and are negative.  Past Medical History:  Diagnosis Date   Achilles tendinosis of left lower extremity 01/23/2017   Anxiety    Asthma    Benign paroxysmal positional vertigo 01/05/2016   Bilateral sensorineural hearing loss 12/22/2014   Blepharitis of upper and lower eyelids of both eyes 04/27/2016   Bradycardia 09/22/2014   Cervicogenic headache 08/24/2016   Chronic high back pain    CKD (chronic kidney disease) 12/08/2014   Cochlear implant in place    Colon polyp    Common migraine with intractable migraine 02/19/2020   Constipation    Diverticulitis    Diverticulosis    DJD (degenerative joint disease)    Double vision    Dry eye syndrome of both lacrimal glands 04/27/2016   Fibromyalgia    Heart murmur    High cholesterol    Hypercalcemia    Hypercholesterolemia    Hyperlipidemia 02/01/2016   Hypertension    Hypoglycemia    Insomnia 09/22/2010   Kidney stone    Meniscus tear    Right   Migraine 08/07/2015   Mild intermittent asthma without complication 99991111   Nephrolithiasis 03/17/2014   Nocturnal leg cramps 05/31/2019   Osteoporosis    Primary hyperparathyroidism ([place]) 12/10/2014   PTSD (post-traumatic stress disorder)    Renal cyst 05/12/2013    Schizophrenia ([place])    treated by Dr. Casimiro Needle   Schizophrenia [facility])    Seizures ([place])    last seizure 3 years ago, not on AED   Stroke ([place]) 07/08/11   "I've had mini strokes before; left side of face  is more down than right "   Syncope and collapse    One episode - 07/10/15   TIA (transient ischemic attack)     Past Surgical History:  Procedure Laterality Date   CATARACT EXTRACTION     COCHLEAR IMPLANT  2010   left   DILATION AND CURETTAGE OF UTERUS     HERNIA [place]   left   KIDNEY STONE SURGERY  ~ 2006   PARATHYROIDECTOMY     skin cancer removal     TONSILLECTOMY     "as a child"   tooth removal     WISDOM TOOTH EXTRACTION       Current Outpatient Medications:    azelastine (ASTELIN) 0.1 % nasal spray, Place into the nose., Disp: , Rfl:    hyoscyamine (LEVBID) 0.375 MG 12 hr tablet, hyoscyamine ER 0.375 mg tablet,extended release,12 hr, Disp: , Rfl:    lidocaine (XYLOCAINE) 5 % ointment, lidocaine 5 % topical ointment, Disp: , Rfl:    lubiprostone (AMITIZA) 8 MCG capsule, TAKE  ONE CAPSULE BY MOUTH TWICE A DAY WITH A MEAL, Disp: , Rfl:    acetaminophen (TYLENOL) 500 MG tablet, Take 500 mg by mouth every 8 (eight) hours as needed for moderate pain., Disp: , Rfl:    Acetaminophen-Codeine 300-30 MG tablet, acetaminophen 300 mg-codeine 30 mg tablet, Disp: , Rfl:    AIMOVIG 140 MG/ML SOAJ, INJECT 140 MG UNDER THE SKIN ONCE EVERY 30 DAYS, Disp: 1 mL, Rfl: 5   aspirin 81 MG EC tablet, Take 81 mg by mouth daily., Disp: , Rfl:    azelastine (ASTELIN) 0.1 % nasal spray, Place 1 spray into both nostrils 2 (two) times daily. Use in each nostril as directed, Disp: 30 mL, Rfl: 6   B COMPLEX VITAMINS PO, Take by mouth., Disp: , Rfl:    Capsaicin 0.1 % CREA, Apply 1 application topically 2 (two) times daily as needed., Disp: 56.6 g, Rfl: 4   celecoxib (CELEBREX) 100 MG capsule, celecoxib 100 mg capsule, Disp: , Rfl:    cloNIDine (CATAPRES) 0.1 MG tablet,  Take 1 tablet (0.1 mg total) by mouth as needed. Use as needed for blood pressure greater than 160/100, Disp: 30 tablet, Rfl: 3   diclofenac Sodium (VOLTAREN) 1 % GEL, Apply 2 g topically 4 (four) times daily., Disp: 100 g, Rfl: 1   felodipine (PLENDIL) 10 MG 24 hr tablet, Take 1 tablet (10 mg total) by mouth daily., Disp: 90 tablet, Rfl: 3   fluticasone (FLONASE) 50 MCG/ACT nasal spray, USE TWO SPRAY(S) IN EACH NOSTRIL ONCE DAILY, Disp: 16 g, Rfl: 5   Fluticasone-Salmeterol (ADVAIR) 100-50 MCG/DOSE AEPB, , Disp: , Rfl:    ibuprofen (ADVIL) 800 MG tablet, Take 800 mg by mouth every 6 (six) hours as needed., Disp: , Rfl:    levalbuterol (XOPENEX HFA) 45 MCG/ACT inhaler, Inhale 2 puffs into the lungs every 8 (eight) hours as needed for wheezing., Disp: 1 each, Rfl: 3   lidocaine (LIDODERM) 5 %, Place 1 patch onto the skin daily. Remove & Discard patch within 12 hours or as directed by MD, Disp: 180 patch, Rfl: 3   losartan (COZAAR) 100 MG tablet, TAKE ONE TABLET BY MOUTH DAILY, Disp: 30 tablet, Rfl: 3   meclizine (ANTIVERT) 25 MG tablet, Take 1 tablet (25 mg total) by mouth 3 (three) times daily as needed for dizziness., Disp: 30 tablet, Rfl: 3   metoprolol succinate (TOPROL-XL) 25 MG 24 hr tablet, TAKE ONE TABLET BY MOUTH DAILY, Disp: 90 tablet, Rfl: 1   Multiple Vitamin (MULTIVITAMIN PO), Take by mouth., Disp: , Rfl:    NUCYNTA 50 MG tablet, Take 50 mg by mouth daily as needed., Disp: , Rfl:    omeprazole (PRILOSEC) 40 MG capsule, Take 1 capsule (40 mg total) by mouth daily., Disp: 90 capsule, Rfl: 3   orphenadrine (NORFLEX) 100 MG tablet, TAKE ONE TABLET BY MOUTH TWICE A DAY AS NEEDED FOR MUSCLE SPASMS, Disp: 60 tablet, Rfl: 0   oxyCODONE (OXY IR/ROXICODONE) 5 MG immediate release tablet, Take 5 mg by mouth daily as needed., Disp: , Rfl:    predniSONE (STERAPRED UNI-PAK 21 TAB) 5 MG (21) TBPK tablet, Take by mouth., Disp: , Rfl:    pregabalin (LYRICA) 200 MG capsule, Take 1 capsule (200 mg total)  by mouth 3 (three) times daily., Disp: 90 capsule, Rfl: 3   rizatriptan (MAXALT) 10 MG tablet, TAKE ONE TABLET BY MOUTH AT ONSET OF HEADACHE; MAY REPEAT ONE TABLET IN 2 HOURS IF NEEDED., Disp: 10 tablet, Rfl: 0  rosuvastatin (CRESTOR) 20 MG tablet, TAKE ONE TABLET BY MOUTH EVERY EVENING, Disp: 90 tablet, Rfl: 2   temazepam (RESTORIL) 15 MG capsule, , Disp: , Rfl:    traZODone (DESYREL) 50 MG tablet, Take 50 mg by mouth at bedtime., Disp: , Rfl:    zolpidem (AMBIEN) 10 MG tablet, Take 2 tablets by mouth at bedtime. , Disp: , Rfl:   Allergies  Allergen Reactions   Amlodipine Shortness Of Breath    Pt states she also had very bad pain    Diclofenac Nausea And Vomiting and Nausea Only   Sulfa Antibiotics Hives and Rash   Famciclovir Other (See Comments)   Metronidazole Nausea And Vomiting   Soma [Carisoprodol] Other (See Comments)    Moody and sleepy Cognitive Function, Daytime sleepiness   Acyclovir    Acyclovir And Related    Benadryl [Diphenhydramine Hcl]     Severe emotional reaction and doesn't work well with Pt. Past history   Cephalosporins Hives   Codeine Other (See Comments)    Makes patient feel odd ; "sometimes mild; sometimes severe reaction; mostly severe"   Cyclobenzaprine Other (See Comments)    Muscle Aches.   Cymbalta [Duloxetine Hcl]     Stomach upset   Diclofenac Sodium Nausea And Vomiting   Dicyclomine    Hydrocodone    Hydrocodone-Acetaminophen    Linzess [Linaclotide] Diarrhea   Nitrofurantoin Monohyd Macro     Felt funny   Suboxone [Buprenorphine Hcl-Naloxone Hcl] Nausea And Vomiting   Tizanidine     Other reaction(s): Other (See Comments) insomnia   Tramadol    Baclofen Nausea Only   Drug Ingredient [Ganciclovir] Rash   Meloxicam     Other reaction(s): Confusion   Sulfamethoxazole Rash          Objective:  Physical Exam  General: AAO x3, NAD  Dermatological: Skin is warm, dry and supple bilateral.  There is no open sores identified  today.  Vascular: Dorsalis Pedis artery and Posterior Tibial artery pedal pulses are 2/4 bilateral with immedate capillary fill time. There is no pain with calf compression, swelling, warmth, erythema.   Neruologic: Sensation decreased.  Musculoskeletal: The majority tenderness is along the course of the posterior tibial, flexor tendons starting posterior to the medial malleolus curving towards the medial aspect the foot.  There is tenderness with light touch to the area.  There is localized edema and faint erythema.  There is no warmth.  No fluctuance or crepitation.  There is no area of pinpoint tenderness.  No pain to lateral ankle, proximal tib-fib or foot.  Muscular strength 5/5 in all groups tested bilateral.  Gait: Unassisted, Nonantalgic.       Assessment:   Right ankle tendinitis, rule out tear     Plan:  -Treatment options discussed including all alternatives, risks, and complications -Etiology of symptoms were discussed -Independent review the x-rays that she had previously.  Also previous blood work reviewed.  I discussed with her I think that majority of her symptoms are coming more from tendon issues given her injury.  I would order an MRI.  She still concerned about gout I will recheck blood work.  We will follow-up after the MRI or sooner if any issues are to arise.  Trula Slade DPM

## 2020-10-10 LAB — URIC ACID: Uric Acid, Serum: 4.2 mg/dL (ref 2.5–7.0)

## 2020-10-10 LAB — C-REACTIVE PROTEIN: CRP: 6.9 mg/L (ref <8.0)

## 2020-10-11 ENCOUNTER — Encounter: Payer: Self-pay | Admitting: Podiatry

## 2020-10-12 ENCOUNTER — Encounter: Payer: Self-pay | Admitting: Registered Nurse

## 2020-10-12 NOTE — Telephone Encounter (Signed)
Suggestions?

## 2020-10-14 ENCOUNTER — Telehealth: Payer: Self-pay | Admitting: Neurology

## 2020-10-14 NOTE — Telephone Encounter (Signed)
FYI Pt just wanted a thank you sent to Dr [name] re: her new medication, she has not been in pain since early August.  No call back requested

## 2020-10-14 NOTE — Telephone Encounter (Signed)
Events noted

## 2020-10-15 ENCOUNTER — Telehealth: Payer: Self-pay | Admitting: *Deleted

## 2020-10-15 NOTE — Telephone Encounter (Signed)
Called and got the automated services and it stated that an mri does not require authorization and called and spoke with [place] and they did ask if patient had any implants and I stated yes cochlear implant and they were checking to see about getting her scheduled. Lattie Haw

## 2020-10-16 ENCOUNTER — Encounter: Payer: Self-pay | Admitting: Registered Nurse

## 2020-10-20 ENCOUNTER — Other Ambulatory Visit: Payer: Self-pay | Admitting: Registered Nurse

## 2020-10-24 ENCOUNTER — Encounter: Payer: Self-pay | Admitting: Registered Nurse

## 2020-10-26 ENCOUNTER — Encounter: Payer: Self-pay | Admitting: Podiatry

## 2020-10-28 ENCOUNTER — Encounter: Payer: Self-pay | Admitting: Registered Nurse

## 2020-11-05 ENCOUNTER — Other Ambulatory Visit: Payer: Self-pay | Admitting: Registered Nurse

## 2020-11-05 DIAGNOSIS — G8929 Other chronic pain: Secondary | ICD-10-CM

## 2020-11-05 DIAGNOSIS — M545 Low back pain, unspecified: Secondary | ICD-10-CM

## 2020-11-05 NOTE — Telephone Encounter (Signed)
LFD 10/06/20 #60 with no refills LOV 10/06/20 NOV none

## 2020-11-09 ENCOUNTER — Encounter: Payer: Self-pay | Admitting: Podiatry

## 2020-11-10 ENCOUNTER — Other Ambulatory Visit (HOSPITAL_BASED_OUTPATIENT_CLINIC_OR_DEPARTMENT_OTHER): Payer: Self-pay | Admitting: Specialist

## 2020-11-10 DIAGNOSIS — M546 Pain in thoracic spine: Secondary | ICD-10-CM

## 2020-11-12 ENCOUNTER — Other Ambulatory Visit: Payer: Self-pay

## 2020-11-12 ENCOUNTER — Ambulatory Visit: Payer: 59 | Admitting: Neurology

## 2020-11-12 ENCOUNTER — Ambulatory Visit (HOSPITAL_BASED_OUTPATIENT_CLINIC_OR_DEPARTMENT_OTHER)
Admission: RE | Admit: 2020-11-12 | Discharge: 2020-11-12 | Disposition: A | Discharge status: Home / Self Care | Payer: 59 | Source: Ambulatory Visit | Attending: Specialist | Admitting: Specialist

## 2020-11-12 ENCOUNTER — Telehealth: Payer: Self-pay | Admitting: Podiatry

## 2020-11-12 DIAGNOSIS — M546 Pain in thoracic spine: Secondary | ICD-10-CM | POA: Diagnosis not present

## 2020-11-12 NOTE — Telephone Encounter (Signed)
Pt called and left message on my line stating she wanted a call back from Dr Jacqualyn Posey.  I returned call and left message that I did receive the call and will pass the message to Dr Jacqualyn Posey but he is in clinic today so it maybe later her gets back in touch with you. I also asked pt to maybe call and let us know what it is regarding so we could pass that information to Dr Jacqualyn Posey.

## 2020-11-16 ENCOUNTER — Encounter: Payer: Self-pay | Admitting: Neurology

## 2020-11-16 ENCOUNTER — Ambulatory Visit (INDEPENDENT_AMBULATORY_CARE_PROVIDER_SITE_OTHER): Payer: 59 | Admitting: Neurology

## 2020-11-16 ENCOUNTER — Telehealth: Payer: Self-pay | Admitting: Neurology

## 2020-11-16 VITALS — BP 146/84 | HR 72 | Ht 62.0 in | Wt 160.0 lb

## 2020-11-16 DIAGNOSIS — M797 Fibromyalgia: Secondary | ICD-10-CM

## 2020-11-16 DIAGNOSIS — G43019 Migraine without aura, intractable, without status migrainosus: Secondary | ICD-10-CM | POA: Diagnosis not present

## 2020-11-16 MED ORDER — RIZATRIPTAN BENZOATE 10 MG PO TABS: ORAL_TABLET | ORAL | 3 refills | Status: DC

## 2020-11-16 MED ORDER — NAPROXEN 500 MG PO TABS: 500.0000 mg | ORAL_TABLET | Freq: Two times a day (BID) | ORAL | 2 refills | Status: DC | PRN

## 2020-11-16 NOTE — Progress Notes (Signed)
Reason for visit: Fibromyalgia, migraine headache  Jasmine [name] is an 65 y.o. female  History of present illness:  Jasmine [name] is a 65 year old left-handed white female with a history of migraine headaches.  The patient is on Aimovig with some benefit.  She is having 1 or 2 headaches a week, about twice a month the headaches may be more incapacitating.  The patient is tolerating Aimovig well.  She has Maxalt, but she has run out of the medication.  This was helpful for her headache.  The patient has fibromyalgia, she was switched to Lyrica which seems to have worked much better for her fibromyalgia pain.  She is being followed through Dr. Tonita Cong from orthopedic surgery for back discomfort.  She has been referred to Dr. Nelva Bush after MRI evaluation of the back.  She apparently had an exacerbation of back pain after she lifted a 25 pound bag of mulch on 30 September 2020.  Past Medical History:  Diagnosis Date   Achilles tendinosis of left lower extremity 01/23/2017   Anxiety    Asthma    Benign paroxysmal positional vertigo 01/05/2016   Bilateral sensorineural hearing loss 12/22/2014   Blepharitis of upper and lower eyelids of both eyes 04/27/2016   Bradycardia 09/22/2014   Cervicogenic headache 08/24/2016   Chronic high back pain    CKD (chronic kidney disease) 12/08/2014   Cochlear implant in place    Colon polyp    Common migraine with intractable migraine 02/19/2020   Constipation    Diverticulitis    Diverticulosis    DJD (degenerative joint disease)    Double vision    Dry eye syndrome of both lacrimal glands 04/27/2016   Fibromyalgia    Heart murmur    High cholesterol    Hypercalcemia    Hypercholesterolemia    Hyperlipidemia 02/01/2016   Hypertension    Hypoglycemia    Insomnia 09/22/2010   Kidney stone    Meniscus tear    Right   Migraine 08/07/2015   Mild intermittent asthma without complication [id number]   Nephrolithiasis 03/17/2014   Nocturnal leg cramps 05/31/2019    Osteoporosis    Primary hyperparathyroidism ([place]) 12/10/2014   PTSD (post-traumatic stress disorder)    Renal cyst 05/12/2013   Schizophrenia ([place])    treated by Dr. Casimiro Needle   Schizophrenia [facility])    Seizures ([place])    last seizure 3 years ago, not on AED   Stroke ([place]) 07/08/11   "I've had mini strokes before; left side of face  is more down than right "   Syncope and collapse    One episode - 07/10/15   TIA (transient ischemic attack)     Past Surgical History:  Procedure Laterality Date   CATARACT EXTRACTION     COCHLEAR IMPLANT  2010   left   DILATION AND CURETTAGE OF UTERUS     HERNIA REPAIR     INGUINAL HERNIA REPAIR  1985   left   KIDNEY STONE SURGERY  ~ 2006   PARATHYROIDECTOMY     skin cancer removal     TONSILLECTOMY     "as a child"   tooth removal     WISDOM TOOTH EXTRACTION      Family History  Problem Relation Age of Onset   Heart attack Father 56       Living   Arthritis Father    Skin cancer Father    Hypertension Father    Hyperlipidemia Father    Leukemia  Mother 83       Deceased   Alzheimer's disease Paternal Aunt        X2   Stomach cancer Paternal Aunt        x1   Arthritis/Rheumatoid Paternal Aunt        x1   Neuropathy Brother        Peripheal   Fibromyalgia Sister    Neuropathy Sister    HIV Sister    Fibromyalgia Sister    Neuropathy Sister    Arthritis/Rheumatoid Sister    Neuropathy Sister    Diabetes Paternal Grandmother    Thyroid disease Sister    Neuropathy Sister     Social history:  reports that she quit smoking about 16 years ago. Her smoking use included cigarettes. She has a 3.00 pack-year smoking history. She has never used smokeless tobacco. She reports current alcohol use. She reports that she does not use drugs.    Allergies  Allergen Reactions   Amlodipine Shortness Of Breath    Pt states she also had very bad pain    Diclofenac Nausea And Vomiting and Nausea Only   Sulfa Antibiotics Hives and Rash    Famciclovir Other (See Comments)   Metronidazole Nausea And Vomiting   Soma [Carisoprodol] Other (See Comments)    Moody and sleepy Cognitive Function, Daytime sleepiness   Acyclovir    Acyclovir And Related    Benadryl [Diphenhydramine Hcl]     Severe emotional reaction and doesn't work well with Pt. Past history   Cephalosporins Hives   Codeine Other (See Comments)    Makes patient feel odd ; "sometimes mild; sometimes severe reaction; mostly severe"   Cyclobenzaprine Other (See Comments)    Muscle Aches.   Cymbalta [Duloxetine Hcl]     Stomach upset   Diclofenac Sodium Nausea And Vomiting   Dicyclomine    Hydrocodone    Hydrocodone-Acetaminophen    Linzess [Linaclotide] Diarrhea   Nitrofurantoin Monohyd Macro     Felt funny   Suboxone [Buprenorphine Hcl-Naloxone Hcl] Nausea And Vomiting   Tizanidine     Other reaction(s): Other (See Comments) insomnia   Tramadol    Baclofen Nausea Only   Drug Ingredient [Ganciclovir] Rash   Meloxicam     Other reaction(s): Confusion   Sulfamethoxazole Rash    Medications:  Prior to Admission medications   Medication Sig Start Date End Date Taking? Authorizing Provider  acetaminophen (TYLENOL) 500 MG tablet Take 500 mg by mouth every 8 (eight) hours as needed for moderate pain.   Yes [provider]  AIMOVIG 140 MG/ML SOAJ INJECT 140 MG UNDER THE SKIN ONCE EVERY 30 DAYS 06/04/20  Yes Kathrynn Ducking, MD  aspirin 81 MG EC tablet Take 81 mg by mouth daily.   Yes [provider]  azelastine (ASTELIN) 0.1 % nasal spray Place 1 spray into both nostrils 2 (two) times daily. Use in each nostril as directed 05/19/20  Yes Maximiano Coss, NP  azelastine (ASTELIN) 0.1 % nasal spray Place into the nose. 05/19/20  Yes [provider]  B COMPLEX VITAMINS PO Take by mouth.   Yes [provider]  Capsaicin 0.1 % CREA Apply 1 application topically 2 (two) times daily as needed. 09/16/20  Yes Maximiano Coss, NP   cloNIDine (CATAPRES) 0.1 MG tablet Take 1 tablet (0.1 mg total) by mouth as needed. Use as needed for blood pressure greater than 160/100 02/13/19  Yes Daune Perch, NP  diclofenac Sodium (VOLTAREN) 1 % GEL  Apply 2 g topically 4 (four) times daily. 08/21/20  Yes Maximiano Coss, NP  felodipine (PLENDIL) 10 MG 24 hr tablet Take 1 tablet (10 mg total) by mouth daily. 01/03/20  Yes Maximiano Coss, NP  fluticasone [facility]) 50 MCG/ACT nasal spray USE TWO SPRAY(S) IN EACH NOSTRIL ONCE DAILY 02/27/20  Yes Maximiano Coss, NP  Fluticasone-Salmeterol (ADVAIR) 100-50 MCG/DOSE AEPB    Yes [provider]  hyoscyamine (LEVBID) 0.375 MG 12 hr tablet hyoscyamine ER 0.375 mg tablet,extended release,12 hr 03/26/20  Yes [provider]  levalbuterol (XOPENEX HFA) 45 MCG/ACT inhaler Inhale 2 puffs into the lungs every 8 (eight) hours as needed for wheezing. 02/27/20  Yes Just, Laurita Quint, FNP  lidocaine (LIDODERM) 5 % Place 1 patch onto the skin daily. Remove & Discard patch within 12 hours or as directed by MD 09/21/20  Yes Maximiano Coss, NP  lidocaine (XYLOCAINE) 5 % ointment lidocaine 5 % topical ointment 01/03/20  Yes [provider]  losartan (COZAAR) 100 MG tablet TAKE ONE TABLET BY MOUTH DAILY 10/07/20  Yes Maximiano Coss, NP  lubiprostone (AMITIZA) 8 MCG capsule TAKE ONE CAPSULE BY MOUTH TWICE A DAY WITH A MEAL 09/07/20  Yes [provider]  meclizine (ANTIVERT) 25 MG tablet Take 1 tablet (25 mg total) by mouth 3 (three) times daily as needed for dizziness. 09/23/20  Yes Kathrynn Ducking, MD  metoprolol succinate (TOPROL-XL) 25 MG 24 hr tablet TAKE ONE TABLET BY MOUTH DAILY 08/05/20  Yes Maximiano Coss, NP  Multiple Vitamin (MULTIVITAMIN PO) Take by mouth.   Yes [provider]  omeprazole (PRILOSEC) 40 MG capsule Take 1 capsule (40 mg total) by mouth daily. 01/03/20  Yes Maximiano Coss, NP  orphenadrine (NORFLEX) 100 MG tablet TAKE ONE TABLET BY MOUTH TWICE A DAY AS  NEEDED FOR MUSCLE SPASMS 11/05/20  Yes Maximiano Coss, NP  oxyCODONE (OXY IR/ROXICODONE) 5 MG immediate release tablet Take 5 mg by mouth daily as needed. 08/26/20  Yes [provider]  pregabalin (LYRICA) 200 MG capsule Take 1 capsule (200 mg total) by mouth 3 (three) times daily. 10/08/20  Yes Kathrynn Ducking, MD  rosuvastatin (CRESTOR) 20 MG tablet TAKE ONE TABLET BY MOUTH EVERY EVENING 10/20/20  Yes Maximiano Coss, NP  temazepam (RESTORIL) 15 MG capsule 30 mg.   Yes [provider]  traZODone (DESYREL) 50 MG tablet Take 50 mg by mouth at bedtime. 09/19/20  Yes [provider]  zolpidem (AMBIEN) 10 MG tablet Take 2 tablets by mouth at bedtime.  04/06/16  Yes [provider]  Acetaminophen-Codeine 300-30 MG tablet acetaminophen 300 mg-codeine 30 mg tablet    [provider]  ibuprofen (ADVIL) 800 MG tablet Take 800 mg by mouth every 6 (six) hours as needed. 08/27/20   [provider]  NUCYNTA 50 MG tablet Take 50 mg by mouth daily as needed. 06/03/20   [provider]  rizatriptan ([place]) 10 MG tablet TAKE ONE TABLET BY MOUTH AT ONSET OF HEADACHE; MAY REPEAT ONE TABLET IN 2 HOURS IF NEEDED. 10/06/20   Maximiano Coss, NP    ROS:  Out of a complete 14 system review of symptoms, the patient complains only of the following symptoms, and all other reviewed systems are negative.  Back pain Headache Walking difficulty  Blood pressure (!) 146/84, pulse 72, height '5\' 2"'$  (1.575 m), weight 160 lb (72.6 kg), SpO2 97 %.  Physical Exam  General: The patient is alert and cooperative at the time of the examination.  Skin: No significant peripheral edema is noted.   Neurologic Exam  Mental status: The patient is alert and oriented x 3 at the time of the examination. The patient has apparent normal recent and remote memory, with an apparently normal attention span and concentration ability.   Cranial nerves: Facial symmetry is present. Speech  is normal, no aphasia or dysarthria is noted. Extraocular movements are full. Visual fields are full.  Motor: The patient has good strength in all 4 extremities.  Sensory examination: Soft touch sensation is symmetric on the face, arms, and legs.  Coordination: The patient has good finger-nose-finger and heel-to-shin bilaterally.  Gait and station: The patient has a somewhat wide-based gait, normally will use a walker for ambulation.  Tandem gait was not attempted.  Romberg is unsteady, the patient has a tendency to fall.  Reflexes: Deep tendon reflexes are symmetric.  Knee jerk and ankle jerk reflexes are somewhat brisk.   Carotid doppler 08/11/20:   Summary: Right Carotid: Velocities in the right ICA are consistent with a 1-39% stenosis.   Left Carotid: Velocities in the left ICA are consistent with a 1-39% stenosis.   Vertebrals:  Bilateral vertebral arteries demonstrate antegrade flow. Subclavians: Normal flow hemodynamics were seen in bilateral subclavian arteries.    EEG 08/05/20:  Impression: This is a normal EEG recording in the waking state. No evidence of ictal or interictal discharges are seen.   Assessment/Plan:  1.  Migraine headache  2.  Fibromyalgia  3.  Chronic low back pain  4.  Gait disorder  The patient will continue the Aimovig and Lyrica.  She seems to be doing well with this.  A prescription was sent in for the [place].  She will follow-up here in 6 months.  In the future, she can be followed through Dr. Jaynee Eagles.  Jill Alexanders MD 11/16/2020 12:16 PM  [facility] [address] [place], [place] [address]  Phone [phone] Fax [phone]

## 2020-11-16 NOTE — Telephone Encounter (Signed)
Pt states it looks like Dr [name] called in a Rx for Maxalt, this is something she is allergic to. Pt wants to make sure it was norflex that was called in for her.  Pt also wants to know if Dr [name] will take over her Rx for naproxen 500 mg.  Please call.

## 2020-11-16 NOTE — Telephone Encounter (Signed)
I called the patient.  The patient indicates that she is allergic to Maxalt, but this is not listed as an allergy and she has taken the medication before.  After discussion with her, she indicates that she has gained benefit from Maxalt in the past, she has the prescription called in so that she can use this.  She also wanted a prescription for naproxen, I sent the prescription in.

## 2020-11-18 ENCOUNTER — Ambulatory Visit (HOSPITAL_BASED_OUTPATIENT_CLINIC_OR_DEPARTMENT_OTHER): Payer: 59

## 2020-11-18 DIAGNOSIS — M5414 Radiculopathy, thoracic region: Secondary | ICD-10-CM | POA: Insufficient documentation

## 2020-11-23 ENCOUNTER — Telehealth: Payer: Self-pay | Admitting: Neurology

## 2020-11-23 NOTE — Telephone Encounter (Signed)
I received a written report from [facility] for MRI of the thoracic and lumbar spine done on 04 March 2017.   MRI thoracic 03/04/17:  Impression:  1.  No evidence of acute thoracic compression fracture. 2.  Multilevel thoracic degenerative disc disease with loss of disc space height at T5-6 through T9-10. 3.  Spinal canal: Mild prominence of the posterior epidural fat.  No high-grade spinal canal stenosis. 4.  Bimodal curvature of the thoracic spine, not fully evaluated by MRI. 5.  Trace bilateral pleural effusions.   MRI lumbar 02/11/20:  Impression:  1.  Mild left L2-3 neuroforaminal narrowing. 2.  Severe right L5-S1 facet arthrosis and hypertrophy without narrowing.  Prominence of epidural fat with severe  The second page of the report was not available to me.

## 2020-11-24 ENCOUNTER — Telehealth: Payer: Self-pay

## 2020-11-24 NOTE — Telephone Encounter (Signed)
I submitted a PA request for Aimovig on CMM, Key: [id number].  Awaiting determination from [facility]

## 2020-11-25 DIAGNOSIS — R87619 Unspecified abnormal cytological findings in specimens from cervix uteri: Secondary | ICD-10-CM | POA: Insufficient documentation

## 2020-11-25 NOTE — Telephone Encounter (Signed)
Aimovig Inj 140mg /Ml, use as directed (1 per month), is approved for 12 months through 09/ 27/2023. Approval letter faxed to pharmacy.

## 2020-11-26 ENCOUNTER — Ambulatory Visit: Payer: 59 | Admitting: Family Medicine

## 2020-11-27 ENCOUNTER — Other Ambulatory Visit: Payer: Self-pay | Admitting: Neurology

## 2020-12-07 DIAGNOSIS — J4541 Moderate persistent asthma with (acute) exacerbation: Secondary | ICD-10-CM | POA: Insufficient documentation

## 2020-12-21 NOTE — Progress Notes (Signed)
Established Patient Office Visit  Subjective:  Patient ID: Jasmine [name], female    DOB: December 31, 1955  Age: 65 y.o. MRN: [id number]  CC:  Chief Complaint  Patient presents with   Hospitalization Follow-up    Pt here to follow up ED visit, pt reports having done CT scan Also needs handicap place card renewed     HPI Jasmine [name] presents for ER follow up  Presented to [facility] on 05/27/20 with L side chest pain and weakness. EKG showed borderline repolarization abnormality, otherwise no remarkable changes.  Troponins negative x 2 CT head unremarkable. Neurologically intact.  Advised to follow up with myself and neurologist Dr. [name] within 1 week.  Today notes she feels back towards baseline. No ongoing chest pain or weakness. Stable at this time Has contacted Dr. [name]' office for sooner appt.  Otherwise no acute concerns  No medication refills needed at this time.  Past Medical History:  Diagnosis Date   Achilles tendinosis of left lower extremity 01/23/2017   Anxiety    Asthma    Benign paroxysmal positional vertigo 01/05/2016   Bilateral sensorineural hearing loss 12/22/2014   Blepharitis of upper and lower eyelids of both eyes 04/27/2016   Bradycardia 09/22/2014   Cervicogenic headache 08/24/2016   Chronic high back pain    CKD (chronic kidney disease) 12/08/2014   Cochlear implant in place    Colon polyp    Common migraine with intractable migraine 02/19/2020   Constipation    Diverticulitis    Diverticulosis    DJD (degenerative joint disease)    Double vision    Dry eye syndrome of both lacrimal glands 04/27/2016   Fibromyalgia    Heart murmur    High cholesterol    Hypercalcemia    Hypercholesterolemia    Hyperlipidemia 02/01/2016   Hypertension    Hypoglycemia    Insomnia 09/22/2010   Kidney stone    Meniscus tear    Right   Migraine 08/07/2015   Mild intermittent asthma without complication 51/70/0174   Nephrolithiasis  03/17/2014   Nocturnal leg cramps 05/31/2019   Osteoporosis    Primary hyperparathyroidism ([place]) 12/10/2014   PTSD (post-traumatic stress disorder)    Renal cyst 05/12/2013   Schizophrenia ([place])    treated by Dr. Casimiro Needle   Schizophrenia [facility])    Seizures ([place])    last seizure 3 years ago, not on AED   Stroke ([place]) 07/08/11   "I've had mini strokes before; left side of face  is more down than right "   Syncope and collapse    One episode - 07/10/15   TIA (transient ischemic attack)     Past Surgical History:  Procedure Laterality Date   CATARACT EXTRACTION     COCHLEAR IMPLANT  2010   left   DILATION AND CURETTAGE OF UTERUS     HERNIA REPAIR     INGUINAL HERNIA REPAIR  1985   left   KIDNEY STONE SURGERY  ~ 2006   PARATHYROIDECTOMY     skin cancer removal     TONSILLECTOMY     "as a child"   tooth removal     WISDOM TOOTH EXTRACTION      Family History  Problem Relation Age of Onset   Heart attack Father 76       Living   Arthritis Father    Skin cancer Father    Hypertension Father    Hyperlipidemia Father    Leukemia Mother 44  Deceased   Alzheimer's disease Paternal Aunt        X2   Stomach cancer Paternal Aunt        x1   Arthritis/Rheumatoid Paternal Aunt        x1   Neuropathy Brother        Peripheal   Fibromyalgia Sister    Neuropathy Sister    HIV Sister    Fibromyalgia Sister    Neuropathy Sister    Arthritis/Rheumatoid Sister    Neuropathy Sister    Diabetes Paternal Grandmother    Thyroid disease Sister    Neuropathy Sister     Social History   Socioeconomic History   Marital status: Married    Spouse name: [name]   Number of children: 3   Years of education: 2 yrs coll   Highest education level: Not on file  Occupational History   Occupation: Disabled  Tobacco Use   Smoking status: Former    Packs/day: 1.00    Years: 3.00    Pack years: 3.00    Types: Cigarettes    Quit date: 02/29/2004    Years since quitting: 16.8    Smokeless tobacco: Never  Vaping Use   Vaping Use: Never used  Substance and Sexual Activity   Alcohol use: Yes    Alcohol/week: 0.0 standard drinks    Comment: "glass of wine q once in awhile; not very often; do it on special occasion"   Drug use: No   Sexual activity: Never  Other Topics Concern   Not on file  Social History Narrative   Lives at home with her husband.   Caffeine 6-7 cups caffeine daily   Left-handed.   Social Determinants of Health   Financial Resource Strain: Not on file  Food Insecurity: Not on file  Transportation Needs: Not on file  Physical Activity: Not on file  Stress: Not on file  Social Connections: Not on file  Intimate Partner Violence: Not on file    Outpatient Medications Prior to Visit  Medication Sig Dispense Refill   acetaminophen (TYLENOL) 500 MG tablet Take 500 mg by mouth every 8 (eight) hours as needed for moderate pain.     aspirin 81 MG EC tablet Take 81 mg by mouth daily.     azelastine (ASTELIN) 0.1 % nasal spray Place 1 spray into both nostrils 2 (two) times daily. Use in each nostril as directed 30 mL 6   B COMPLEX VITAMINS PO Take by mouth.     cloNIDine (CATAPRES) 0.1 MG tablet Take 1 tablet (0.1 mg total) by mouth as needed. Use as needed for blood pressure greater than 160/100 30 tablet 3   felodipine (PLENDIL) 10 MG 24 hr tablet Take 1 tablet (10 mg total) by mouth daily. 90 tablet 3   fluticasone (FLONASE) 50 MCG/ACT nasal spray USE TWO SPRAY(S) IN EACH NOSTRIL ONCE DAILY 16 g 5   Fluticasone-Salmeterol (ADVAIR) 100-50 MCG/DOSE AEPB      levalbuterol (XOPENEX HFA) 45 MCG/ACT inhaler Inhale 2 puffs into the lungs every 8 (eight) hours as needed for wheezing. 1 each 3   Multiple Vitamin (MULTIVITAMIN PO) Take by mouth.     omeprazole (PRILOSEC) 40 MG capsule Take 1 capsule (40 mg total) by mouth daily. 90 capsule 3   temazepam (RESTORIL) 15 MG capsule 30 mg.     zolpidem (AMBIEN) 10 MG tablet Take 2 tablets by mouth at bedtime.       AIMOVIG 140 MG/ML SOAJ INJECT 140 MG  UNDER THE SKIN ONCE EVERY 30 DAYS 1 mL 5   albuterol (PROVENTIL) (2.5 MG/3ML) 0.083% nebulizer solution Take 3 mLs (2.5 mg total) by nebulization every 6 (six) hours as needed for wheezing or shortness of breath. 75 mL 0   albuterol (VENTOLIN HFA) 108 (90 Base) MCG/ACT inhaler Inhale 2 puffs into the lungs every 6 (six) hours as needed for wheezing or shortness of breath. 6.7 g 2   amoxicillin-clavulanate (AUGMENTIN) 875-125 MG tablet Take 1 tablet by mouth 2 (two) times daily. 14 tablet 0   clonazePAM (KLONOPIN) 1 MG tablet SMARTSIG:3 Tablet(s) By Mouth Every Other Day PRN     DULoxetine (CYMBALTA) 30 MG capsule One capsule daily for 2 weeks, then take one twice a day 60 capsule 3   gabapentin (NEURONTIN) 800 MG tablet Take 1 tablet (800 mg total) by mouth 3 (three) times daily. 270 tablet 1   LACTOBACILLUS RHAMNOSUS, GG, PO Take by mouth.      lidocaine (XYLOCAINE) 5 % ointment Apply 1 application topically as needed. 35.44 g 0   losartan (COZAAR) 100 MG tablet TAKE ONE TABLET BY MOUTH DAILY 90 tablet 1   metoprolol succinate (TOPROL-XL) 25 MG 24 hr tablet Take 1 tablet (25 mg total) by mouth daily. Take 25 mg by mouth daily. 90 tablet 3   orphenadrine (NORFLEX) 100 MG tablet Take 1 tablet (100 mg total) by mouth 2 (two) times daily as needed for muscle spasms. 180 tablet 1   predniSONE (DELTASONE) 20 MG tablet Take 1 tablet (20 mg total) by mouth daily with breakfast. (Patient not taking: Reported on 06/09/2020) 5 tablet 0   psyllium (METAMUCIL) 58.6 % packet Take 1 packet by mouth daily.     rosuvastatin (CRESTOR) 20 MG tablet TAKE ONE TABLET BY MOUTH EVERY EVENING 90 tablet 2   No facility-administered medications prior to visit.    Allergies  Allergen Reactions   Amlodipine Shortness Of Breath    Pt states she also had very bad pain    Diclofenac Nausea And Vomiting and Nausea Only   Sulfa Antibiotics Hives and Rash   Famciclovir Other (See  Comments)   Metronidazole Nausea And Vomiting   Soma [Carisoprodol] Other (See Comments)    Moody and sleepy Cognitive Function, Daytime sleepiness   Acyclovir    Acyclovir And Related    Benadryl [Diphenhydramine Hcl]     Severe emotional reaction and doesn't work well with Pt. Past history   Cephalosporins Hives   Codeine Other (See Comments)    Makes patient feel odd ; "sometimes mild; sometimes severe reaction; mostly severe"   Cyclobenzaprine Other (See Comments)    Muscle Aches.   Cymbalta [Duloxetine Hcl]     Stomach upset   Diclofenac Sodium Nausea And Vomiting   Dicyclomine    Hydrocodone    Hydrocodone-Acetaminophen    Linzess [Linaclotide] Diarrhea   Nitrofurantoin Monohyd Macro     Felt funny   Suboxone [Buprenorphine Hcl-Naloxone Hcl] Nausea And Vomiting   Tizanidine     Other reaction(s): Other (See Comments) insomnia   Tramadol    Baclofen Nausea Only   Drug Ingredient [Ganciclovir] Rash   Meloxicam     Other reaction(s): Confusion   Sulfamethoxazole Rash    ROS Review of Systems  Constitutional: Negative.   HENT: Negative.    Eyes: Negative.   Respiratory: Negative.    Cardiovascular: Negative.   Gastrointestinal: Negative.   Genitourinary: Negative.   Musculoskeletal: Negative.   Skin: Negative.   Neurological:  Negative.   Psychiatric/Behavioral: Negative.    All other systems reviewed and are negative.    Objective:    Physical Exam Vitals and nursing note reviewed.  Constitutional:      General: She is not in acute distress.    Appearance: Normal appearance. She is normal weight. She is not ill-appearing, toxic-appearing or diaphoretic.  Cardiovascular:     Rate and Rhythm: Normal rate and regular rhythm.     Heart sounds: Normal heart sounds. No murmur heard.   No friction rub. No gallop.  Pulmonary:     Effort: Pulmonary effort is normal. No respiratory distress.     Breath sounds: Normal breath sounds. No stridor. No wheezing,  rhonchi or rales.  Chest:     Chest wall: No tenderness.  Skin:    General: Skin is warm and dry.  Neurological:     General: No focal deficit present.     Mental Status: She is alert and oriented to person, place, and time. Mental status is at baseline.  Psychiatric:        Mood and Affect: Mood normal.        Behavior: Behavior normal.        Thought Content: Thought content normal.        Judgment: Judgment normal.    BP 138/68   Temp 98.3 F (36.8 C) (Temporal)   Resp 16   Ht 5\' 4"  (1.626 m)   Wt 180 lb 6.4 oz (81.8 kg)   BMI 30.97 kg/m  Wt Readings from Last 3 Encounters:  11/16/20 160 lb (72.6 kg)  09/16/20 169 lb (76.7 kg)  09/08/20 171 lb 12.8 oz (77.9 kg)     Health Maintenance Due  Topic Date Due   Zoster Vaccines- Shingrix (1 of 2) 04/02/1976   PAP SMEAR-Modifier  06/09/2017   COVID-19 Vaccine (2 - Janssen risk series) 07/01/2019   INFLUENZA VACCINE  09/28/2020    There are no preventive care reminders to display for this patient.  Lab Results  Component Value Date   TSH 4.460 10/04/2019   Lab Results  Component Value Date   WBC 9.9 09/16/2020   HGB 13.3 09/16/2020   HCT 40.0 09/16/2020   MCV 86.7 09/16/2020   PLT 234.0 09/16/2020   Lab Results  Component Value Date   NA 141 09/16/2020   K 3.8 09/16/2020   CO2 26 09/16/2020   GLUCOSE 93 09/16/2020   BUN 23 09/16/2020   CREATININE 0.67 09/16/2020   BILITOT 0.5 09/16/2020   ALKPHOS 72 09/16/2020   AST 23 09/16/2020   ALT 24 09/16/2020   PROT 6.5 09/16/2020   ALBUMIN 4.4 09/16/2020   CALCIUM 9.6 09/16/2020   ANIONGAP 9 05/27/2020   GFR 92.99 09/16/2020   Lab Results  Component Value Date   CHOL 180 10/04/2019   Lab Results  Component Value Date   HDL 63 10/04/2019   Lab Results  Component Value Date   LDLCALC 106 (H) 10/04/2019   Lab Results  Component Value Date   TRIG 57 10/04/2019   Lab Results  Component Value Date   CHOLHDL 2.9 10/04/2019   Lab Results  Component  Value Date   HGBA1C 6.1 09/16/2020      Assessment & Plan:   Problem List Items Addressed This Visit   None Visit Diagnoses     Hospital discharge follow-up    -  Primary       No orders of the defined types were placed  in this encounter.   Follow-up: No follow-ups on file.   PLAN Personally reviewed EKG and labs from hospital. Reviewed with patient Given her stability, will continue to follow up as planned. No labs today I do encourage her to reach out to Dr. [name] for an earlier visit Patient encouraged to call clinic with any questions, comments, or concerns.  Maximiano Coss, NP

## 2020-12-31 NOTE — Progress Notes (Signed)
Established Patient Office Visit  Subjective:  Patient ID: Jasmine [name], female    DOB: September 09, 1955  Age: 65 y.o. MRN: [id number]  CC:  Chief Complaint  Patient presents with   Ear Pain    Patient states she has been having some ear pa    HPI Jasmine [name] presents for ear pain  Left ear Onset 1 week ago Worsening Baseline hearing loss unchanged No drainage Notes some upper respiratory congestion No other symptoms at this time.   Past Medical History:  Diagnosis Date   Achilles tendinosis of left lower extremity 01/23/2017   Anxiety    Asthma    Benign paroxysmal positional vertigo 01/05/2016   Bilateral sensorineural hearing loss 12/22/2014   Blepharitis of upper and lower eyelids of both eyes 04/27/2016   Bradycardia 09/22/2014   Cervicogenic headache 08/24/2016   Chronic high back pain    CKD (chronic kidney disease) 12/08/2014   Cochlear implant in place    Colon polyp    Common migraine with intractable migraine 02/19/2020   Constipation    Diverticulitis    Diverticulosis    DJD (degenerative joint disease)    Double vision    Dry eye syndrome of both lacrimal glands 04/27/2016   Fibromyalgia    Heart murmur    High cholesterol    Hypercalcemia    Hypercholesterolemia    Hyperlipidemia 02/01/2016   Hypertension    Hypoglycemia    Insomnia 09/22/2010   Kidney stone    Meniscus tear    Right   Migraine 08/07/2015   Mild intermittent asthma without complication 70/35/0093   Nephrolithiasis 03/17/2014   Nocturnal leg cramps 05/31/2019   Osteoporosis    Primary hyperparathyroidism ([place]) 12/10/2014   PTSD (post-traumatic stress disorder)    Renal cyst 05/12/2013   Schizophrenia ([place])    treated by Dr. Casimiro Needle   Schizophrenia [facility])    Seizures ([place])    last seizure 3 years ago, not on AED   Stroke ([place]) 07/08/11   "I've had mini strokes before; left side of face  is more down than right "   Syncope and collapse    One episode - 07/10/15   TIA  (transient ischemic attack)     Past Surgical History:  Procedure Laterality Date   CATARACT EXTRACTION     COCHLEAR IMPLANT  2010   left   DILATION AND CURETTAGE OF UTERUS     HERNIA REPAIR     INGUINAL HERNIA REPAIR  1985   left   KIDNEY STONE SURGERY  ~ 2006   PARATHYROIDECTOMY     skin cancer removal     TONSILLECTOMY     "as a child"   tooth removal     WISDOM TOOTH EXTRACTION      Family History  Problem Relation Age of Onset   Heart attack Father 73       Living   Arthritis Father    Skin cancer Father    Hypertension Father    Hyperlipidemia Father    Leukemia Mother 74       Deceased   Alzheimer's disease Paternal Aunt        X2   Stomach cancer Paternal Aunt        x1   Arthritis/Rheumatoid Paternal Aunt        x1   Neuropathy Brother        Peripheal   Fibromyalgia Sister    Neuropathy Sister    HIV Sister  Fibromyalgia Sister    Neuropathy Sister    Arthritis/Rheumatoid Sister    Neuropathy Sister    Diabetes Paternal Grandmother    Thyroid disease Sister    Neuropathy Sister     Social History   Socioeconomic History   Marital status: Married    Spouse name: [name]   Number of children: 3   Years of education: 2 yrs coll   Highest education level: Not on file  Occupational History   Occupation: Disabled  Tobacco Use   Smoking status: Former    Packs/day: 1.00    Years: 3.00    Pack years: 3.00    Types: Cigarettes    Quit date: 02/29/2004    Years since quitting: 16.8   Smokeless tobacco: Never  Vaping Use   Vaping Use: Never used  Substance and Sexual Activity   Alcohol use: Yes    Alcohol/week: 0.0 standard drinks    Comment: "glass of wine q once in awhile; not very often; do it on special occasion"   Drug use: No   Sexual activity: Never  Other Topics Concern   Not on file  Social History Narrative   Lives at home with her husband.   Caffeine 6-7 cups caffeine daily   Left-handed.   Social Determinants of Health    Financial Resource Strain: Not on file  Food Insecurity: Not on file  Transportation Needs: Not on file  Physical Activity: Not on file  Stress: Not on file  Social Connections: Not on file  Intimate Partner Violence: Not on file    Outpatient Medications Prior to Visit  Medication Sig Dispense Refill   acetaminophen (TYLENOL) 500 MG tablet Take 500 mg by mouth every 8 (eight) hours as needed for moderate pain.     aspirin 81 MG EC tablet Take 81 mg by mouth daily.     B COMPLEX VITAMINS PO Take by mouth.     cloNIDine (CATAPRES) 0.1 MG tablet Take 1 tablet (0.1 mg total) by mouth as needed. Use as needed for blood pressure greater than 160/100 30 tablet 3   felodipine (PLENDIL) 10 MG 24 hr tablet Take 1 tablet (10 mg total) by mouth daily. 90 tablet 3   fluticasone (FLONASE) 50 MCG/ACT nasal spray USE TWO SPRAY(S) IN EACH NOSTRIL ONCE DAILY 16 g 5   Fluticasone-Salmeterol (ADVAIR) 100-50 MCG/DOSE AEPB      levalbuterol (XOPENEX HFA) 45 MCG/ACT inhaler Inhale 2 puffs into the lungs every 8 (eight) hours as needed for wheezing. 1 each 3   Multiple Vitamin (MULTIVITAMIN PO) Take by mouth.     omeprazole (PRILOSEC) 40 MG capsule Take 1 capsule (40 mg total) by mouth daily. 90 capsule 3   temazepam (RESTORIL) 15 MG capsule 30 mg.     zolpidem (AMBIEN) 10 MG tablet Take 2 tablets by mouth at bedtime.      AIMOVIG 140 MG/ML SOAJ INJECT 140MG  UNDER THE SKIN EVERY 30 DAYS 1 mL 5   albuterol (PROVENTIL) (2.5 MG/3ML) 0.083% nebulizer solution Take 3 mLs (2.5 mg total) by nebulization every 6 (six) hours as needed for wheezing or shortness of breath. 75 mL 0   albuterol (VENTOLIN HFA) 108 (90 Base) MCG/ACT inhaler Inhale 2 puffs into the lungs every 6 (six) hours as needed for wheezing or shortness of breath. 6.7 g 2   amoxicillin-clavulanate (AUGMENTIN) 875-125 MG tablet Take 1 tablet by mouth 2 (two) times daily. 14 tablet 0   azelastine (ASTELIN) 0.1 % nasal spray Place  1 spray into both  nostrils 2 (two) times daily. Use in each nostril as directed 30 mL 6   azithromycin (ZITHROMAX) 250 MG tablet Take 2 tabs on first day. Then take 1 tab daily. Finish entire supply. 6 tablet 0   clonazePAM (KLONOPIN) 1 MG tablet SMARTSIG:3 Tablet(s) By Mouth Every Other Day PRN     DULoxetine (CYMBALTA) 30 MG capsule One capsule daily for 2 weeks, then take one twice a day 60 capsule 3   gabapentin (NEURONTIN) 800 MG tablet Take 1 tablet (800 mg total) by mouth 3 (three) times daily. 270 tablet 1   LACTOBACILLUS RHAMNOSUS, GG, PO Take by mouth.      lidocaine (XYLOCAINE) 5 % ointment Apply 1 application topically as needed. 35.44 g 0   losartan (COZAAR) 100 MG tablet TAKE ONE TABLET BY MOUTH DAILY 90 tablet 1   metoprolol succinate (TOPROL-XL) 25 MG 24 hr tablet Take 1 tablet (25 mg total) by mouth daily. Take 25 mg by mouth daily. 90 tablet 3   orphenadrine (NORFLEX) 100 MG tablet Take 1 tablet (100 mg total) by mouth 2 (two) times daily as needed for muscle spasms. 180 tablet 1   predniSONE (DELTASONE) 20 MG tablet Take 1 tablet (20 mg total) by mouth daily with breakfast. 4 tablet 0   psyllium (METAMUCIL) 58.6 % packet Take 1 packet by mouth daily.     rosuvastatin (CRESTOR) 20 MG tablet TAKE ONE TABLET BY MOUTH EVERY EVENING 90 tablet 2   No facility-administered medications prior to visit.    Allergies  Allergen Reactions   Amlodipine Shortness Of Breath    Pt states she also had very bad pain    Diclofenac Nausea And Vomiting and Nausea Only   Sulfa Antibiotics Hives and Rash   Famciclovir Other (See Comments)   Metronidazole Nausea And Vomiting   Soma [Carisoprodol] Other (See Comments)    Moody and sleepy Cognitive Function, Daytime sleepiness   Acyclovir    Acyclovir And Related    Benadryl [Diphenhydramine Hcl]     Severe emotional reaction and doesn't work well with Pt. Past history   Cephalosporins Hives   Codeine Other (See Comments)    Makes patient feel odd ;  "sometimes mild; sometimes severe reaction; mostly severe"   Cyclobenzaprine Other (See Comments)    Muscle Aches.   Cymbalta [Duloxetine Hcl]     Stomach upset   Diclofenac Sodium Nausea And Vomiting   Dicyclomine    Hydrocodone    Hydrocodone-Acetaminophen    Linzess [Linaclotide] Diarrhea   Nitrofurantoin Monohyd Macro     Felt funny   Suboxone [Buprenorphine Hcl-Naloxone Hcl] Nausea And Vomiting   Tizanidine     Other reaction(s): Other (See Comments) insomnia   Tramadol    Baclofen Nausea Only   Drug Ingredient [Ganciclovir] Rash   Meloxicam     Other reaction(s): Confusion   Sulfamethoxazole Rash    ROS Review of Systems  Constitutional: Negative.   HENT:  Positive for congestion, ear pain and hearing loss (baseline). Negative for dental problem, drooling, ear discharge, facial swelling, mouth sores, nosebleeds, postnasal drip, rhinorrhea, sinus pressure, sinus pain, sneezing, sore throat, tinnitus, trouble swallowing and voice change.   Eyes: Negative.   Respiratory: Negative.    Cardiovascular: Negative.   Gastrointestinal: Negative.   Genitourinary: Negative.   Musculoskeletal: Negative.   Skin: Negative.   Neurological: Negative.   Psychiatric/Behavioral: Negative.    All other systems reviewed and are negative.    Objective:  Physical Exam Vitals and nursing note reviewed.  Constitutional:      General: She is not in acute distress.    Appearance: Normal appearance. She is normal weight. She is not ill-appearing, toxic-appearing or diaphoretic.  HENT:     Right Ear: Tympanic membrane, ear canal and external ear normal. Decreased hearing noted. No tenderness.     Left Ear: Ear canal and external ear normal. Decreased hearing noted. Tenderness present. A middle ear effusion is present.  Cardiovascular:     Rate and Rhythm: Normal rate and regular rhythm.     Heart sounds: Normal heart sounds. No murmur heard.   No friction rub. No gallop.  Pulmonary:      Effort: Pulmonary effort is normal. No respiratory distress.     Breath sounds: Normal breath sounds. No stridor. No wheezing, rhonchi or rales.  Chest:     Chest wall: No tenderness.  Skin:    General: Skin is warm and dry.  Neurological:     General: No focal deficit present.     Mental Status: She is alert and oriented to person, place, and time. Mental status is at baseline.  Psychiatric:        Mood and Affect: Mood normal.        Behavior: Behavior normal.        Thought Content: Thought content normal.        Judgment: Judgment normal.    BP 138/77   Pulse 74   Temp 98 F (36.7 C) (Temporal)   Resp 18   Ht 5\' 4"  (1.626 m)   Wt 180 lb (81.6 kg)   SpO2 99%   BMI 30.90 kg/m  Wt Readings from Last 3 Encounters:  11/16/20 160 lb (72.6 kg)  09/16/20 169 lb (76.7 kg)  09/08/20 171 lb 12.8 oz (77.9 kg)     Health Maintenance Due  Topic Date Due   PAP SMEAR-Modifier  06/09/2017   COVID-19 Vaccine (2 - [place] risk series) 07/01/2019   INFLUENZA VACCINE  09/28/2020    There are no preventive care reminders to display for this patient.  Lab Results  Component Value Date   TSH 4.460 10/04/2019   Lab Results  Component Value Date   WBC 9.9 09/16/2020   HGB 13.3 09/16/2020   HCT 40.0 09/16/2020   MCV 86.7 09/16/2020   PLT 234.0 09/16/2020   Lab Results  Component Value Date   NA 141 09/16/2020   K 3.8 09/16/2020   CO2 26 09/16/2020   GLUCOSE 93 09/16/2020   BUN 23 09/16/2020   CREATININE 0.67 09/16/2020   BILITOT 0.5 09/16/2020   ALKPHOS 72 09/16/2020   AST 23 09/16/2020   ALT 24 09/16/2020   PROT 6.5 09/16/2020   ALBUMIN 4.4 09/16/2020   CALCIUM 9.6 09/16/2020   ANIONGAP 9 05/27/2020   GFR 92.99 09/16/2020   Lab Results  Component Value Date   CHOL 180 10/04/2019   Lab Results  Component Value Date   HDL 63 10/04/2019   Lab Results  Component Value Date   LDLCALC 106 (H) 10/04/2019   Lab Results  Component Value Date   TRIG 57  10/04/2019   Lab Results  Component Value Date   CHOLHDL 2.9 10/04/2019   Lab Results  Component Value Date   HGBA1C 6.1 09/16/2020      Assessment & Plan:   Problem List Items Addressed This Visit       Respiratory   Perennial allergic rhinitis with  seasonal variation   Relevant Medications   azelastine (ASTELIN) 0.1 % nasal spray   Other Visit Diagnoses     Acute suppurative otitis media of left ear without spontaneous rupture of tympanic membrane, recurrence not specified    -  Primary       Meds ordered this encounter  Medications   DISCONTD: azithromycin (ZITHROMAX) 250 MG tablet    Sig: Take 2 tabs on first day. Then take 1 tab daily. Finish entire supply.    Dispense:  6 tablet    Refill:  0    Order Specific Question:   Supervising Provider    Answer:   Carlota Raspberry, JEFFREY R [[id number]]   DISCONTD: predniSONE (DELTASONE) 20 MG tablet    Sig: Take 1 tablet (20 mg total) by mouth daily with breakfast.    Dispense:  5 tablet    Refill:  0    Order Specific Question:   Supervising Provider    Answer:   Carlota Raspberry, JEFFREY R [[id number]]   azelastine (ASTELIN) 0.1 % nasal spray    Sig: Place 1 spray into both nostrils 2 (two) times daily. Use in each nostril as directed    Dispense:  30 mL    Refill:  6    Order Specific Question:   Supervising Provider    Answer:   Carlota Raspberry, JEFFREY R [[id number]]    Follow-up: No follow-ups on file.   PLAN Aom. Tx with z pack and prednisone as above. Astelin for congestion Return if worsening or failing to improve. Can consider ENT referral if persistent. Patient encouraged to call clinic with any questions, comments, or concerns.  Maximiano Coss, NP

## 2021-01-02 ENCOUNTER — Other Ambulatory Visit: Payer: Self-pay | Admitting: Registered Nurse

## 2021-01-08 ENCOUNTER — Other Ambulatory Visit: Payer: Self-pay | Admitting: Neurology

## 2021-01-08 NOTE — Telephone Encounter (Signed)
Pt called wanting to know if she can get back on her Gabapentin and off of the Lyrica because the Lyrica makes her to emotional. Please advise.

## 2021-01-11 MED ORDER — GABAPENTIN 800 MG PO TABS: 800.0000 mg | ORAL_TABLET | Freq: Three times a day (TID) | ORAL | 1 refills | Status: DC

## 2021-01-11 NOTE — Telephone Encounter (Signed)
I spoke to the patient. She has been using pregabalin 200mg , one capsule TID for fibromyalgia symptoms since August 2022. She feels the medication is making her emotional. She is requesting to go back on the previous dose of gabapentin 800mg , one capsule TID. She has been under the care of Dr. [name] and Butler Denmark, NP. Request will be sent to work-in MD for review.

## 2021-01-11 NOTE — Telephone Encounter (Signed)
I returned the call to the patient. She is going to reach out to her PCP for a referral to a physician that will treat her fibromyalgia. In the meantime, a 90-day x 1 rx of gabapentin has been sent to the pharmacy to prevent disruption to her treatment. She will let us know if she wishes to continue her care her for migraines.

## 2021-01-11 NOTE — Telephone Encounter (Signed)
Left message for patient to return the call.

## 2021-01-13 ENCOUNTER — Encounter: Payer: Self-pay | Admitting: Neurology

## 2021-01-13 ENCOUNTER — Other Ambulatory Visit: Payer: Self-pay | Admitting: *Deleted

## 2021-01-13 NOTE — Telephone Encounter (Signed)
Please Advise

## 2021-01-13 NOTE — Telephone Encounter (Signed)
I spoke to the patient. She tried going back to gabapentin 800mg , one tab TID with a previous prescription on hand. The medication did not help her pain at all. She had a rough couple of days. Last night, she stopped gabapentin and restarted pregabalin 200mg  which works much better. She would like to continue it at 200mg , one capsule TID. I called the pharmacy and voided the gabapentin prescription sent in by Dr. Jaynee Eagles on 01/11/21. It was never filled. She will need refills of the pregabalin. 90-day x 1 rx sent to pharmacy by work-in MD. She verbalized understanding our office will no longer treat her fibromyalgia. I instructed her to contact her PCP today for further care to discuss her needs (may need a referral to pain management or rheumatology). She agreed to this plan.

## 2021-01-13 NOTE — Addendum Note (Signed)
Addended by: Noberto Retort C on: 01/13/2021 03:28 PM   Modules accepted: Orders

## 2021-01-13 NOTE — Telephone Encounter (Signed)
Mychart message received today from patient:  I decided to go back on Lryica it does sound much better for my back. I think what I experienced I lost very close friend why I was sad. Jasmine [name] it part of process. Let me know will that work? I will give it back to pharmacist. I am glad I did that. I was miserable on Gabepentin.   Left patient a message to return my call.

## 2021-01-14 MED ORDER — PREGABALIN 200 MG PO CAPS: 200.0000 mg | ORAL_CAPSULE | Freq: Three times a day (TID) | ORAL | 5 refills | Status: AC

## 2021-01-19 ENCOUNTER — Other Ambulatory Visit: Payer: Self-pay | Admitting: Registered Nurse

## 2021-01-19 DIAGNOSIS — I10 Essential (primary) hypertension: Secondary | ICD-10-CM

## 2021-01-20 ENCOUNTER — Other Ambulatory Visit: Payer: Self-pay | Admitting: Registered Nurse

## 2021-01-20 DIAGNOSIS — K219 Gastro-esophageal reflux disease without esophagitis: Secondary | ICD-10-CM

## 2021-02-18 DIAGNOSIS — M7061 Trochanteric bursitis, right hip: Secondary | ICD-10-CM | POA: Insufficient documentation

## 2021-03-18 ENCOUNTER — Telehealth: Payer: Self-pay | Admitting: Neurology

## 2021-03-18 DIAGNOSIS — G43019 Migraine without aura, intractable, without status migrainosus: Secondary | ICD-10-CM

## 2021-03-18 MED ORDER — RIZATRIPTAN BENZOATE 10 MG PO TABS: ORAL_TABLET | ORAL | 3 refills | Status: DC

## 2021-03-18 NOTE — Telephone Encounter (Signed)
Pt has been complaint with f/u and has an upcoming appt with sara in March. Refill has been sent.

## 2021-03-18 NOTE — Telephone Encounter (Signed)
Pt is requesting a refill for rizatriptan (MAXALT) 10 MG tablet.  Pharmacy: [facility] [id number]

## 2021-04-17 NOTE — Progress Notes (Signed)
Telemedicine Encounter- SOAP NOTE Established Patient  This telephone encounter was conducted with the patient's (or proxy's) verbal consent via audio telecommunications: yes/no: Yes Patient was instructed to have this encounter in a suitably private space; and to only have persons present to whom they give permission to participate. In addition, patient identity was confirmed by use of name plus two identifiers (DOB and address).  I discussed the limitations, risks, security and privacy concerns of performing an evaluation and management service by telephone and the availability of in person appointments. I also discussed with the patient that there may be a patient responsible charge related to this service. The patient expressed understanding and agreed to proceed.  I spent a total of 11 minutes talking with the patient or their proxy.  Patient at home Provider in office  Participants: Kathrin Ruddy, NP and Elissa Lovett  Chief Complaint  Patient presents with   Cough    Patient states she has been having she having some coughing for about 3 or 4 days and has been having some drainage and wants to make sure its not an infection.    Subjective   Jasmine [name] is a 66 y.o. established patient. Telephone visit today for cough  HPI Onset 4 days ago Worsening Pnd, chest congestion. Has taken OTC meds without relief. Sick contact - husband with similar symptoms.   Patient Active Problem List   Diagnosis Date Noted   Peripheral neuropathy 09/16/2020   Common migraine with intractable migraine 02/19/2020   Nocturnal leg cramps 05/31/2019   Anorgasmia of female 05/22/2018   Abnormal weight loss 05/22/2018   Degeneration of spine 05/22/2018   Depressive disorder 05/22/2018   Diverticulitis 05/22/2018   Achilles tendinosis of left lower extremity 01/23/2017   Cervicogenic headache 08/24/2016   Pain of toe of left foot 05/19/2016   Left shoulder pain 05/11/2016   Dry eye  syndrome of both lacrimal glands 04/27/2016   Hyperlipidemia 02/01/2016   Benign paroxysmal positional vertigo 01/05/2016   Fibromyalgia 10/31/2015   Migraine 08/07/2015   Neck pain 08/07/2015   Myalgia and myositis 03/04/2015   Bilateral sensorineural hearing loss 12/22/2014   DJD (degenerative joint disease) of knee 12/09/2014   Essential hypertension 12/08/2014   Bradycardia 09/22/2014   Hypercalcemia 09/22/2014   Osteoporosis 12/23/2013   DJD (degenerative joint disease) 08/19/2013   Renal cyst 05/12/2013   Obesity 09/19/2012   Chronic low back pain 11/30/2011   Insomnia 09/22/2010   Perennial allergic rhinitis with seasonal variation 09/22/2010   Mild intermittent asthma without complication 93/26/7124   Hypercholesteremia 09/22/2010    Past Medical History:  Diagnosis Date   Achilles tendinosis of left lower extremity 01/23/2017   Anxiety    Asthma    Benign paroxysmal positional vertigo 01/05/2016   Bilateral sensorineural hearing loss 12/22/2014   Blepharitis of upper and lower eyelids of both eyes 04/27/2016   Bradycardia 09/22/2014   Cervicogenic headache 08/24/2016   Chronic high back pain    CKD (chronic kidney disease) 12/08/2014   Cochlear implant in place    Colon polyp    Common migraine with intractable migraine 02/19/2020   Constipation    Diverticulitis    Diverticulosis    DJD (degenerative joint disease)    Double vision    Dry eye syndrome of both lacrimal glands 04/27/2016   Fibromyalgia    Heart murmur    High cholesterol    Hypercalcemia    Hypercholesterolemia    Hyperlipidemia 02/01/2016  Hypertension    Hypoglycemia    Insomnia 09/22/2010   Kidney stone    Meniscus tear    Right   Migraine 08/07/2015   Mild intermittent asthma without complication 42/35/3614   Nephrolithiasis 03/17/2014   Nocturnal leg cramps 05/31/2019   Osteoporosis    Primary hyperparathyroidism ([place]) 12/10/2014   PTSD (post-traumatic stress disorder)    Renal  cyst 05/12/2013   Schizophrenia ([place])    treated by Dr. Casimiro Needle   Schizophrenia [facility])    Seizures ([place])    last seizure 3 years ago, not on AED   Stroke ([place]) 07/08/11   "I've had mini strokes before; left side of face  is more down than right "   Syncope and collapse    One episode - 07/10/15   TIA (transient ischemic attack)     Current Outpatient Medications  Medication Sig Dispense Refill   acetaminophen (TYLENOL) 500 MG tablet Take 500 mg by mouth every 8 (eight) hours as needed for moderate pain.     aspirin 81 MG EC tablet Take 81 mg by mouth daily.     B COMPLEX VITAMINS PO Take by mouth.     cloNIDine (CATAPRES) 0.1 MG tablet Take 1 tablet (0.1 mg total) by mouth as needed. Use as needed for blood pressure greater than 160/100 30 tablet 3   felodipine (PLENDIL) 10 MG 24 hr tablet Take 1 tablet (10 mg total) by mouth daily. 90 tablet 3   fluticasone (FLONASE) 50 MCG/ACT nasal spray USE TWO SPRAY(S) IN EACH NOSTRIL ONCE DAILY 16 g 5   Fluticasone-Salmeterol (ADVAIR) 100-50 MCG/DOSE AEPB      levalbuterol (XOPENEX HFA) 45 MCG/ACT inhaler Inhale 2 puffs into the lungs every 8 (eight) hours as needed for wheezing. 1 each 3   Multiple Vitamin (MULTIVITAMIN PO) Take by mouth.     omeprazole (PRILOSEC) 40 MG capsule Take 1 capsule (40 mg total) by mouth daily. 90 capsule 3   temazepam (RESTORIL) 15 MG capsule 30 mg.     zolpidem (AMBIEN) 10 MG tablet Take 2 tablets by mouth at bedtime.      AIMOVIG 140 MG/ML SOAJ INJECT UNDER THE SKIN ONCE EVERY 30 DAYS 1 mL 5   azelastine (ASTELIN) 0.1 % nasal spray Place 1 spray into both nostrils 2 (two) times daily. Use in each nostril as directed 30 mL 6   azelastine (ASTELIN) 0.1 % nasal spray Place into the nose.     Capsaicin 0.1 % CREA Apply 1 application topically 2 (two) times daily as needed. 56.6 g 4   diclofenac Sodium (VOLTAREN) 1 % GEL Apply 2 g topically 4 (four) times daily. 100 g 1   hyoscyamine (LEVBID) 0.375 MG 12 hr tablet  hyoscyamine ER 0.375 mg tablet,extended release,12 hr     lidocaine (LIDODERM) 5 % Place 1 patch onto the skin daily. Remove & Discard patch within 12 hours or as directed by MD 180 patch 3   lidocaine (XYLOCAINE) 5 % ointment lidocaine 5 % topical ointment     losartan (COZAAR) 100 MG tablet TAKE ONE TABLET BY MOUTH DAILY 30 tablet 3   lubiprostone (AMITIZA) 8 MCG capsule TAKE ONE CAPSULE BY MOUTH TWICE A DAY WITH A MEAL     meclizine (ANTIVERT) 25 MG tablet Take 1 tablet (25 mg total) by mouth 3 (three) times daily as needed for dizziness. 30 tablet 3   metoprolol succinate (TOPROL-XL) 25 MG 24 hr tablet TAKE ONE TABLET BY MOUTH DAILY 90 tablet  1   naproxen (NAPROSYN) 500 MG tablet Take 1 tablet (500 mg total) by mouth every 12 (twelve) hours as needed. 60 tablet 2   orphenadrine (NORFLEX) 100 MG tablet TAKE ONE TABLET BY MOUTH TWICE A DAY AS NEEDED FOR MUSCLE SPASMS 60 tablet 0   oxyCODONE (OXY IR/ROXICODONE) 5 MG immediate release tablet Take 5 mg by mouth daily as needed.     pregabalin (LYRICA) 200 MG capsule Take 1 capsule (200 mg total) by mouth 3 (three) times daily. 90 capsule 5   rizatriptan (MAXALT) 10 MG tablet Take one tablet up to three times a day if needed 10 tablet 3   rosuvastatin (CRESTOR) 20 MG tablet TAKE ONE TABLET BY MOUTH EVERY EVENING 30 tablet 1   traZODone (DESYREL) 50 MG tablet Take 50 mg by mouth at bedtime.     No current facility-administered medications for this visit.    Allergies  Allergen Reactions   Amlodipine Shortness Of Breath    Pt states she also had very bad pain    Diclofenac Nausea And Vomiting and Nausea Only   Sulfa Antibiotics Hives and Rash   Famciclovir Other (See Comments)   Metronidazole Nausea And Vomiting   Soma [Carisoprodol] Other (See Comments)    Moody and sleepy Cognitive Function, Daytime sleepiness   Acyclovir    Acyclovir And Related    Benadryl [Diphenhydramine Hcl]     Severe emotional reaction and doesn't work well with  Pt. Past history   Cephalosporins Hives   Codeine Other (See Comments)    Makes patient feel odd ; "sometimes mild; sometimes severe reaction; mostly severe"   Cyclobenzaprine Other (See Comments)    Muscle Aches.   Cymbalta [Duloxetine Hcl]     Stomach upset   Diclofenac Sodium Nausea And Vomiting   Dicyclomine    Hydrocodone    Hydrocodone-Acetaminophen    Linzess [Linaclotide] Diarrhea   Nitrofurantoin Monohyd Macro     Felt funny   Suboxone [Buprenorphine Hcl-Naloxone Hcl] Nausea And Vomiting   Tizanidine     Other reaction(s): Other (See Comments) insomnia   Tramadol    Baclofen Nausea Only   Drug Ingredient [Ganciclovir] Rash   Meloxicam     Other reaction(s): Confusion   Sulfamethoxazole Rash    Social History   Socioeconomic History   Marital status: Married    Spouse name: [name]   Number of children: 3   Years of education: 2 yrs coll   Highest education level: Not on file  Occupational History   Occupation: Disabled  Tobacco Use   Smoking status: Former    Packs/day: 1.00    Years: 3.00    Pack years: 3.00    Types: Cigarettes    Quit date: 02/29/2004    Years since quitting: 17.1   Smokeless tobacco: Never  Vaping Use   Vaping Use: Never used  Substance and Sexual Activity   Alcohol use: Yes    Alcohol/week: 0.0 standard drinks    Comment: "glass of wine q once in awhile; not very often; do it on special occasion"   Drug use: No   Sexual activity: Never  Other Topics Concern   Not on file  Social History Narrative   Lives at home with her husband.   Caffeine 6-7 cups caffeine daily   Left-handed.   Social Determinants of Health   Financial Resource Strain: Not on file  Food Insecurity: Not on file  Transportation Needs: Not on file  Physical Activity: Not on  file  Stress: Not on file  Social Connections: Not on file  Intimate Partner Violence: Not on file    ROS Per hpi  Objective   Vitals as reported by the patient: There were no  vitals filed for this visit.  Jasmine [name] was seen today for cough.  Diagnoses and all orders for this visit:  Lower respiratory infection -     Discontinue: amoxicillin-clavulanate (AUGMENTIN) 875-125 MG tablet; Take 1 tablet by mouth 2 (two) times daily. -     Discontinue: predniSONE (DELTASONE) 20 MG tablet; Take 1 tablet (20 mg total) by mouth daily with breakfast.   PLAN Long history of bacterial infections. Tx with augmentin and prednisone Continue OTC meds Rest and hydrate Patient encouraged to call clinic with any questions, comments, or concerns.    I discussed the assessment and treatment plan with the patient. The patient was provided an opportunity to ask questions and all were answered. The patient agreed with the plan and demonstrated an understanding of the instructions.   The patient was advised to call back or seek an in-person evaluation if the symptoms worsen or if the condition fails to improve as anticipated.  I provided 11 minutes of non-face-to-face time during this encounter.  Maximiano Coss, NP  Primary Care at [facility]

## 2021-05-19 ENCOUNTER — Ambulatory Visit (INDEPENDENT_AMBULATORY_CARE_PROVIDER_SITE_OTHER): Payer: 59 | Admitting: Neurology

## 2021-05-19 ENCOUNTER — Encounter: Payer: Self-pay | Admitting: Neurology

## 2021-05-19 DIAGNOSIS — K5900 Constipation, unspecified: Secondary | ICD-10-CM | POA: Insufficient documentation

## 2021-05-19 DIAGNOSIS — G43019 Migraine without aura, intractable, without status migrainosus: Secondary | ICD-10-CM | POA: Diagnosis not present

## 2021-05-19 DIAGNOSIS — J45991 Cough variant asthma: Secondary | ICD-10-CM | POA: Insufficient documentation

## 2021-05-19 DIAGNOSIS — Z860100 Personal history of colon polyps, unspecified: Secondary | ICD-10-CM | POA: Insufficient documentation

## 2021-05-19 DIAGNOSIS — R112 Nausea with vomiting, unspecified: Secondary | ICD-10-CM | POA: Insufficient documentation

## 2021-05-19 DIAGNOSIS — E559 Vitamin D deficiency, unspecified: Secondary | ICD-10-CM | POA: Insufficient documentation

## 2021-05-19 DIAGNOSIS — J309 Allergic rhinitis, unspecified: Secondary | ICD-10-CM | POA: Insufficient documentation

## 2021-05-19 DIAGNOSIS — R1013 Epigastric pain: Secondary | ICD-10-CM | POA: Insufficient documentation

## 2021-05-19 DIAGNOSIS — K219 Gastro-esophageal reflux disease without esophagitis: Secondary | ICD-10-CM | POA: Insufficient documentation

## 2021-05-19 MED ORDER — RIZATRIPTAN BENZOATE 10 MG PO TABS: ORAL_TABLET | ORAL | 11 refills | Status: DC

## 2021-05-19 MED ORDER — AIMOVIG 140 MG/ML ~~LOC~~ SOAJ: SUBCUTANEOUS | 11 refills | Status: DC

## 2021-05-19 NOTE — Progress Notes (Signed)
? ?PATIENT: Jasmine [name] ?DOB: 05-07-1955 ? ?REASON FOR VISIT: Follow up for migraines ?HISTORY FROM: Patient, husband ?PRIMARY NEUROLOGIST: Dr. Jaynee Eagles  ? ?ASSESSMENT AND PLAN ?66 y.o. year old female  ? ?1.  Migraine headache ?2.  Fibromyalgia ?3.  Chronic low back pain ?4.  Gait disorder ? ?-Currently, migraines are under good control, she is pleased ?-Continue Aimovig 140 mg monthly injection for migraine prevention ?-Continue Maxalt 10 mg as needed for acute headache ?-Will continue seeing orthopedics for chronic back issues ?-PCP will prescribe Lyrica for fibromyalgia going forward per patient, she does see [facility], but doesn't take any medications they prescribe ?-Follow-up here in 1 year or sooner if needed for migraine management, will be followed by Dr. Jaynee Eagles in the future ? ?HISTORY OF PRESENT ILLNESS: ?Today 05/19/21 ?Jasmine [name] here today for follow-up. Has about 1-2 migraines a week, on Aimovig 140 mg monthly, tolerates well. Takes maxalt for acute headache, works well. Takes the Lyrica up to 200 mg 3 times daily for fibro pain, it helps. Goes to [facility] when injection of low back is needed. Vertigo flares intermittently. Takes meclizine with good benefit. Brought her own cushions for the chair. Sees [facility], but claims not taking any "strong" medications. PCP will prescribe Lyrica for her. On prednisone for sinus infection currently. Has left cochlear implant. ? ?HISTORY  ?11/16/2020 Dr. [name]: Jasmine [name] is a 66 year old left-handed white female with a history of migraine headaches.  The patient is on Aimovig with some benefit.  She is having 1 or 2 headaches a week, about twice a month the headaches may be more incapacitating.  The patient is tolerating Aimovig well.  She has Maxalt, but she has run out of the medication.  This was helpful for her headache.  The patient has fibromyalgia, she was switched to Lyrica which seems to have worked much better for her fibromyalgia  pain.  She is being followed through Dr. Tonita Cong from orthopedic surgery for back discomfort.  She has been referred to Dr. Nelva Bush after MRI evaluation of the back.  She apparently had an exacerbation of back pain after she lifted a 25 pound bag of mulch on 30 September 2020. ? ?REVIEW OF SYSTEMS: Out of a complete 14 system review of symptoms, the patient complains only of the following symptoms, and all other reviewed systems are negative. ? ?See HPI ? ?ALLERGIES: ?Allergies  ?Allergen Reactions  ? Amlodipine Shortness Of Breath  ?  Pt states she also had very bad pain   ? Diclofenac Nausea And Vomiting and Nausea Only  ? Sulfa Antibiotics Hives and Rash  ? Famciclovir Other (See Comments)  ? Metronidazole Nausea And Vomiting  ? Soma [Carisoprodol] Other (See Comments)  ?  Moody and sleepy ?Cognitive Function, Daytime sleepiness  ? Acyclovir   ? Acyclovir And Related   ? Benadryl [Diphenhydramine Hcl]   ?  Severe emotional reaction and doesn't work well with Pt. Past history  ? Cephalosporins Hives  ? Codeine Other (See Comments)  ?  Makes patient feel odd ; "sometimes mild; sometimes severe reaction; mostly severe"  ? Cyclobenzaprine Other (See Comments)  ?  Muscle Aches.  ? Cymbalta [Duloxetine Hcl]   ?  Stomach upset  ? Diclofenac Sodium Nausea And Vomiting  ? Dicyclomine   ? Hydrocodone   ? Hydrocodone-Acetaminophen   ? Linzess [Linaclotide] Diarrhea  ? Nitrofurantoin Monohyd Macro   ?  Felt funny  ? Suboxone [Buprenorphine Hcl-Naloxone Hcl] Nausea And Vomiting  ?  Tizanidine   ?  Other reaction(s): Other (See Comments) ?insomnia  ? Tramadol   ? Baclofen Nausea Only  ? Drug Ingredient [Ganciclovir] Rash  ? Meloxicam   ?  Other reaction(s): Confusion  ? Sulfamethoxazole Rash  ? ? ?HOME MEDICATIONS: ?Outpatient Medications Prior to Visit  ?Medication Sig Dispense Refill  ? acetaminophen (TYLENOL) 500 MG tablet Take 500 mg by mouth every 8 (eight) hours as needed for moderate pain.    ? AIMOVIG 140 MG/ML SOAJ INJECT  UNDER THE SKIN ONCE EVERY 30 DAYS 1 mL 5  ? aspirin 81 MG EC tablet Take 81 mg by mouth daily.    ? azelastine (ASTELIN) 0.1 % nasal spray Place 1 spray into both nostrils 2 (two) times daily. Use in each nostril as directed 30 mL 6  ? azelastine (ASTELIN) 0.1 % nasal spray Place into the nose.    ? B COMPLEX VITAMINS PO Take by mouth.    ? cloNIDine (CATAPRES) 0.1 MG tablet Take 1 tablet (0.1 mg total) by mouth as needed. Use as needed for blood pressure greater than 160/100 30 tablet 3  ? diclofenac Sodium (VOLTAREN) 1 % GEL Apply 2 g topically 4 (four) times daily. 100 g 1  ? felodipine (PLENDIL) 10 MG 24 hr tablet Take 1 tablet (10 mg total) by mouth daily. 90 tablet 3  ? fluticasone (FLONASE) 50 MCG/ACT nasal spray USE TWO SPRAY(S) IN EACH NOSTRIL ONCE DAILY 16 g 5  ? Fluticasone-Salmeterol (ADVAIR) 100-50 MCG/DOSE AEPB     ? hyoscyamine (LEVBID) 0.375 MG 12 hr tablet hyoscyamine ER 0.375 mg tablet,extended release,12 hr    ? levalbuterol (XOPENEX HFA) 45 MCG/ACT inhaler Inhale 2 puffs into the lungs every 8 (eight) hours as needed for wheezing. 1 each 3  ? lidocaine (LIDODERM) 5 % Place 1 patch onto the skin daily. Remove & Discard patch within 12 hours or as directed by MD 180 patch 3  ? lidocaine (XYLOCAINE) 5 % ointment lidocaine 5 % topical ointment    ? losartan (COZAAR) 100 MG tablet TAKE ONE TABLET BY MOUTH DAILY 30 tablet 3  ? lubiprostone (AMITIZA) 8 MCG capsule TAKE ONE CAPSULE BY MOUTH TWICE A DAY WITH A MEAL    ? meclizine (ANTIVERT) 25 MG tablet Take 1 tablet (25 mg total) by mouth 3 (three) times daily as needed for dizziness. 30 tablet 3  ? Menthol (BIOFREEZE) 5 % PTCH Apply 1 patch topically as needed.    ? metoprolol succinate (TOPROL-XL) 25 MG 24 hr tablet TAKE ONE TABLET BY MOUTH DAILY 90 tablet 1  ? Multiple Vitamin (MULTIVITAMIN PO) Take by mouth.    ? naproxen (NAPROSYN) 500 MG tablet Take 1 tablet (500 mg total) by mouth every 12 (twelve) hours as needed. 60 tablet 2  ? omeprazole  (PRILOSEC) 40 MG capsule Take 1 capsule (40 mg total) by mouth daily. 90 capsule 3  ? orphenadrine (NORFLEX) 100 MG tablet TAKE ONE TABLET BY MOUTH TWICE A DAY AS NEEDED FOR MUSCLE SPASMS 60 tablet 0  ? oxyCODONE (OXY IR/ROXICODONE) 5 MG immediate release tablet Take 5 mg by mouth daily as needed.    ? pregabalin (LYRICA) 200 MG capsule Take 1 capsule (200 mg total) by mouth 3 (three) times daily. 90 capsule 5  ? rizatriptan (MAXALT) 10 MG tablet Take one tablet up to three times a day if needed 10 tablet 3  ? rosuvastatin (CRESTOR) 20 MG tablet TAKE ONE TABLET BY MOUTH EVERY EVENING 30 tablet 1  ?  temazepam (RESTORIL) 15 MG capsule 30 mg.    ? traZODone (DESYREL) 50 MG tablet Take 50 mg by mouth at bedtime.    ? zolpidem (AMBIEN) 10 MG tablet Take 2 tablets by mouth at bedtime.     ? Capsaicin 0.1 % CREA Apply 1 application topically 2 (two) times daily as needed. 56.6 g 4  ? ?No facility-administered medications prior to visit.  ? ? ?PAST MEDICAL HISTORY: ?Past Medical History:  ?Diagnosis Date  ? Achilles tendinosis of left lower extremity 01/23/2017  ? Anxiety   ? Asthma   ? Benign paroxysmal positional vertigo 01/05/2016  ? Bilateral sensorineural hearing loss 12/22/2014  ? Blepharitis of upper and lower eyelids of both eyes 04/27/2016  ? Bradycardia 09/22/2014  ? Cervicogenic headache 08/24/2016  ? Chronic high back pain   ? CKD (chronic kidney disease) 12/08/2014  ? Cochlear implant in place   ? Colon polyp   ? Common migraine with intractable migraine 02/19/2020  ? Constipation   ? Diverticulitis   ? Diverticulosis   ? DJD (degenerative joint disease)   ? Double vision   ? Dry eye syndrome of both lacrimal glands 04/27/2016  ? Fibromyalgia   ? Heart murmur   ? High cholesterol   ? Hypercalcemia   ? Hypercholesterolemia   ? Hyperlipidemia 02/01/2016  ? Hypertension   ? Hypoglycemia   ? Insomnia 09/22/2010  ? Kidney stone   ? Meniscus tear   ? Right  ? Migraine 08/07/2015  ? Mild intermittent asthma without  complication 03/00/9233  ? Nephrolithiasis 03/17/2014  ? Nocturnal leg cramps 05/31/2019  ? Osteoporosis   ? Primary hyperparathyroidism ([place]) 12/10/2014  ? PTSD (post-traumatic stress disorder)   ? Renal cyst 05/12/2013

## 2021-05-21 ENCOUNTER — Other Ambulatory Visit: Payer: Self-pay | Admitting: Registered Nurse

## 2021-05-21 DIAGNOSIS — J302 Other seasonal allergic rhinitis: Secondary | ICD-10-CM

## 2021-05-30 ENCOUNTER — Encounter: Payer: Self-pay | Admitting: Family Medicine

## 2021-06-03 ENCOUNTER — Other Ambulatory Visit: Payer: Self-pay | Admitting: Registered Nurse

## 2021-06-03 DIAGNOSIS — J302 Other seasonal allergic rhinitis: Secondary | ICD-10-CM

## 2021-08-04 ENCOUNTER — Other Ambulatory Visit: Payer: Self-pay | Admitting: Registered Nurse

## 2021-08-04 DIAGNOSIS — I10 Essential (primary) hypertension: Secondary | ICD-10-CM

## 2021-09-29 ENCOUNTER — Encounter: Payer: Self-pay | Admitting: Neurology

## 2021-09-29 ENCOUNTER — Ambulatory Visit (INDEPENDENT_AMBULATORY_CARE_PROVIDER_SITE_OTHER): Payer: 59 | Admitting: Neurology

## 2021-09-29 ENCOUNTER — Ambulatory Visit: Payer: 59 | Admitting: Neurology

## 2021-09-29 VITALS — BP 147/87 | HR 69 | Ht 62.0 in | Wt 165.0 lb

## 2021-09-29 DIAGNOSIS — H8110 Benign paroxysmal vertigo, unspecified ear: Secondary | ICD-10-CM

## 2021-09-29 DIAGNOSIS — R4189 Other symptoms and signs involving cognitive functions and awareness: Secondary | ICD-10-CM | POA: Diagnosis not present

## 2021-09-29 DIAGNOSIS — G43009 Migraine without aura, not intractable, without status migrainosus: Secondary | ICD-10-CM

## 2021-09-29 MED ORDER — NURTEC 75 MG PO TBDP
75.0000 mg | ORAL_TABLET | ORAL | 11 refills | Status: DC
Start: 2021-09-29 — End: 2021-10-07

## 2021-09-29 NOTE — Progress Notes (Signed)
PATIENT: Jasmine [name] DOB: 07-22-1955  REASON FOR VISIT: Follow up for migraines HISTORY FROM: Patient, husband PRIMARY NEUROLOGIST: Dr. Jaynee [name]   ASSESSMENT AND PLAN 66 y.o. year old female   1.  Migraine headache 2.  Fibromyalgia 3.  Chronic low back pain 4.  Gait disorder 5.  Vertigo 6.  Generalized weakness  -Here with multiple complaints today including: Migraine, vertigo, general weakness, visual blurring towards the end of the day, cognitive clouding, slow processing speed -She is already seeing ophthalmology, ENT, pain [name] -Stop Aimovig, try Nurtec for migraine preventative, developed needle fear  -Continue Maxalt as needed for acute headache, discussed potential for rebound headache, not to treat headache more than 2 to 3 days a week -Check MRI of the brain rule out stroke, other acute abnormality, she does have cochlear implant that is reportedly MRI compatible, on exam she has some left-sided weakness to her arm and leg, but I am not convinced it is organic  -She has 28 medication allergies/interaction -Dr. Jannifer [name] has assigned Dr. Jaynee [name] as primary, I will see what MRI shows, right now I scheduled 4 months with Dr. Jaynee [name] but we can adjust this  HISTORY OF PRESENT ILLNESS: Today 09/29/21 Here today with her husband. Seen for sooner appointment. Claims referred from cardiologist who she was seeing for CP for vertigo. Vertigo started 18 weeks ago. Feels the room is spinning when she bends over and gets weak. Had cardiac stress test was normal reportedly. Felt CP was related to BP flu cations. Remains on Aimovig 140 mg monthly injection. Last injection was in June, prior to that missed several months. Her husband has fear of injecting his wife. Has had few migraines, not sure how many. Takes Maxalt, uses all 10 tablets monthly. It does help. Headache is to the top right vertex. Feels vision toward end of the day is blurry, has seen [facility] doctor. Most times has bad headache,  sensitive to light, sound, nausea. Concerned with functioning of the brain, hard time concentrating, focusing. Feels like she is weak. Feels these symptoms over the last 18 weeks. No longer on oxycodone. Towards the end of the day her speech is slurred. PCP gives Lyrica for fibro. Is most concerned with the functioning of her brain, something not right neurologically. Has left cochlear implant. No falls. 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 Her speech is slurred at times. She brought 3 cushions to sit on.  She and her husband are poor historians. She cancelled this appointment, then arrived 2 minutes before appointment time to be seen.   Update 05/19/21 SS: Jasmine [name] here today for follow-up. Has about 1-2 migraines a week, on Aimovig 140 mg monthly, tolerates well. Takes maxalt for acute headache, works well. Takes the Lyrica up to 200 mg 3 times daily for fibro pain, it helps. Goes to [facility] when injection of low back is needed. Vertigo flares intermittently. Takes meclizine with good benefit. Brought her own cushions for the chair. Sees Jasmine [name], but claims not taking any "strong" medications. PCP will prescribe Lyrica for her. On prednisone for sinus infection currently. Has left cochlear implant.  HISTORY  11/16/2020 Dr. Jannifer [name]: [name] is a 66 year old left-handed white female with a history of migraine headaches.  The patient is on Aimovig with some benefit.  She is having 1 or 2 headaches a week, about twice a month the headaches may be more incapacitating.  The patient is tolerating Aimovig well.  She has Maxalt, but  she has run out of the medication.  This was helpful for her headache.  The patient has fibromyalgia, she was switched to Lyrica which seems to have worked much better for her fibromyalgia pain.  She is being followed through Dr. Tonita [name] from orthopedic surgery for back discomfort.  She has been referred to Dr. Nelva [name] after MRI evaluation of the back.  She apparently  had an exacerbation of back pain after she lifted a 25 pound bag of mulch on 30 September 2020.  REVIEW OF SYSTEMS: Out of a complete 14 system review of symptoms, the patient complains only of the following symptoms, and all other reviewed systems are negative.  See HPI  ALLERGIES: Allergies  Allergen Reactions   Amlodipine Shortness Of Breath    Pt states she also had very bad pain    Clonidine Palpitations   Diclofenac Nausea And Vomiting and Nausea Only   Hydrochlorothiazide Shortness Of Breath   Sulfa Antibiotics Hives and Rash   Famciclovir Other (See Comments)   Metronidazole Nausea And Vomiting   Soma [Carisoprodol] Other (See Comments)    Moody and sleepy Cognitive Function, Daytime sleepiness   Acyclovir    Acyclovir And Related    Benadryl [Diphenhydramine Hcl]     Severe emotional reaction and doesn't work well with Pt. Past history   Cephalosporins Hives   Codeine Other (See Comments)    Makes patient feel odd ; "sometimes mild; sometimes severe reaction; mostly severe"   Cyclobenzaprine Other (See Comments)    Muscle Aches.   Cymbalta [Duloxetine Hcl]     Stomach upset   Diclofenac Sodium Nausea And Vomiting   Dicyclomine    Hydrocodone    Hydrocodone-Acetaminophen    Linzess [Linaclotide] Diarrhea   Nitrofurantoin Monohyd Macro     Felt funny   Suboxone [Buprenorphine Hcl-Naloxone Hcl] Nausea And Vomiting   Tizanidine     Other reaction(s): Other (See Comments) insomnia   Tramadol    Baclofen Nausea Only   Drug Ingredient [Ganciclovir] Rash   Meloxicam     Other reaction(s): Confusion   Sulfamethoxazole Rash    HOME MEDICATIONS: Outpatient Medications Prior to Visit  Medication Sig Dispense Refill   azelastine (ASTELIN) 0.1 % nasal spray Place 1 spray into both nostrils 2 (two) times daily. Use in each nostril as directed 30 mL 6   azelastine (ASTELIN) 0.1 % nasal spray Place into the nose.     B COMPLEX VITAMINS PO Take by mouth.     diclofenac  Sodium (VOLTAREN) 1 % GEL Apply 2 g topically 4 (four) times daily. 100 g 1   Erenumab-aooe (AIMOVIG) 140 MG/ML SOAJ INJECT UNDER THE SKIN ONCE EVERY 30 DAYS 1 mL 11   felodipine (PLENDIL) 10 MG 24 hr tablet Take 1 tablet (10 mg total) by mouth daily. 90 tablet 3   fluticasone (FLONASE) 50 MCG/ACT nasal spray SPRAY TWO SPRAYS IN EACH NOSTRIL ONCE DAILY 16 mL 0   Fluticasone-Salmeterol (ADVAIR) 100-50 MCG/DOSE AEPB      levalbuterol (XOPENEX HFA) 45 MCG/ACT inhaler Inhale 2 puffs into the lungs every 8 (eight) hours as needed for wheezing. 1 each 3   losartan (COZAAR) 100 MG tablet TAKE ONE TABLET BY MOUTH DAILY (Patient taking differently: 50 mg.) 30 tablet 3   lubiprostone (AMITIZA) 8 MCG capsule TAKE ONE CAPSULE BY MOUTH TWICE A DAY WITH A MEAL     meclizine (ANTIVERT) 25 MG tablet Take 1 tablet (25 mg total) by mouth 3 (three) times daily as  needed for dizziness. 30 tablet 3   Menthol (BIOFREEZE) 5 % PTCH Apply 1 patch topically as needed.     metoprolol succinate (TOPROL-XL) 25 MG 24 hr tablet TAKE ONE TABLET BY MOUTH DAILY (Patient taking differently: 12.5 mg.) 90 tablet 1   Multiple Vitamin (MULTIVITAMIN PO) Take by mouth.     naproxen (NAPROSYN) 500 MG tablet Take 1 tablet (500 mg total) by mouth every 12 (twelve) hours as needed. 60 tablet 2   omeprazole (PRILOSEC) 40 MG capsule Take 1 capsule (40 mg total) by mouth daily. 90 capsule 3   orphenadrine (NORFLEX) 100 MG tablet TAKE ONE TABLET BY MOUTH TWICE A DAY AS NEEDED FOR MUSCLE SPASMS 60 tablet 0   oxyCODONE (OXY IR/ROXICODONE) 5 MG immediate release tablet Take 5 mg by mouth daily as needed.     pregabalin (LYRICA) 200 MG capsule Take 1 capsule (200 mg total) by mouth 3 (three) times daily. 90 capsule 5   rizatriptan (MAXALT) 10 MG tablet Take one tablet up to three times a day if needed 10 tablet 11   rosuvastatin (CRESTOR) 20 MG tablet TAKE ONE TABLET BY MOUTH EVERY EVENING 30 tablet 1   temazepam (RESTORIL) 15 MG capsule 30 mg.      traZODone (DESYREL) 50 MG tablet Take 50 mg by mouth at bedtime.     zolpidem (AMBIEN) 10 MG tablet Take 2 tablets by mouth at bedtime.      acetaminophen (TYLENOL) 500 MG tablet Take 500 mg by mouth every 8 (eight) hours as needed for moderate pain.     aspirin 81 MG EC tablet Take 81 mg by mouth daily.     cloNIDine (CATAPRES) 0.1 MG tablet Take 1 tablet (0.1 mg total) by mouth as needed. Use as needed for blood pressure greater than 160/100 30 tablet 3   hyoscyamine (LEVBID) 0.375 MG 12 hr tablet hyoscyamine ER 0.375 mg tablet,extended release,12 hr     lidocaine (LIDODERM) 5 % Place 1 patch onto the skin daily. Remove & Discard patch within 12 hours or as directed by MD 180 patch 3   lidocaine (XYLOCAINE) 5 % ointment lidocaine 5 % topical ointment     No facility-administered medications prior to visit.    PAST MEDICAL HISTORY: Past Medical History:  Diagnosis Date   Achilles tendinosis of left lower extremity 01/23/2017   Anxiety    Asthma    Benign paroxysmal positional vertigo 01/05/2016   Bilateral sensorineural hearing loss 12/22/2014   Blepharitis of upper and lower eyelids of both eyes 04/27/2016   Bradycardia 09/22/2014   Cervicogenic headache 08/24/2016   Chronic high back pain    CKD (chronic kidney disease) 12/08/2014   Cochlear implant in place    Colon polyp    Common migraine with intractable migraine 02/19/2020   Constipation    Diverticulitis    Diverticulosis    DJD (degenerative joint disease)    Double vision    Dry eye syndrome of both lacrimal glands 04/27/2016   Fibromyalgia    Heart murmur    High cholesterol    Hypercalcemia    Hypercholesterolemia    Hyperlipidemia 02/01/2016   Hypertension    Hypoglycemia    Insomnia 09/22/2010   Kidney stone    Meniscus tear    Right   Migraine 08/07/2015   Mild intermittent asthma without complication 46/27/0350   Nephrolithiasis 03/17/2014   Nocturnal leg cramps 05/31/2019   Osteoporosis    Primary  hyperparathyroidism ([place]) 12/10/2014  PTSD (post-traumatic stress disorder)    Renal cyst 05/12/2013   Schizophrenia ([place])    treated by Dr. Casimiro Needle   Schizophrenia [facility])    Seizures ([place])    last seizure 3 years ago, not on AED   Stroke ([place]) 07/08/11   "I've had mini strokes before; left side of face  is more down than right "   Syncope and collapse    One episode - 07/10/15   TIA (transient ischemic attack)     PAST SURGICAL HISTORY: Past Surgical History:  Procedure Laterality Date   CATARACT EXTRACTION     COCHLEAR IMPLANT  2010   left   DILATION AND CURETTAGE OF UTERUS     HERNIA REPAIR     INGUINAL HERNIA REPAIR  1985   left   KIDNEY STONE SURGERY  ~ 2006   PARATHYROIDECTOMY     skin cancer removal     TONSILLECTOMY     "as a child"   tooth removal     WISDOM TOOTH EXTRACTION      FAMILY HISTORY: Family History  Problem Relation Age of Onset   Heart attack Father 25       Living   Arthritis Father    Skin cancer Father    Hypertension Father    Hyperlipidemia Father    Leukemia Mother 6       Deceased   Alzheimer's disease Paternal Aunt        X2   Stomach cancer Paternal Aunt        x1   Arthritis/Rheumatoid Paternal Aunt        x1   Neuropathy Brother        Peripheal   Fibromyalgia Sister    Neuropathy Sister    HIV Sister    Fibromyalgia Sister    Neuropathy Sister    Arthritis/Rheumatoid Sister    Neuropathy Sister    Diabetes Paternal Grandmother    Thyroid disease Sister    Neuropathy Sister     SOCIAL HISTORY: Social History   Socioeconomic History   Marital status: Married    Spouse name: [name]   Number of children: 3   Years of education: 2 yrs coll   Highest education level: Not on file  Occupational History   Occupation: Disabled  Tobacco Use   Smoking status: Former    Packs/day: 1.00    Years: 3.00    Total pack years: 3.00    Types: Cigarettes    Quit date: 02/29/2004    Years since quitting: 17.5   Smokeless  tobacco: Never  Vaping Use   Vaping Use: Never used  Substance and Sexual Activity   Alcohol use: Yes    Alcohol/week: 0.0 standard drinks of alcohol    Comment: "glass of wine q once in awhile; not very often; do it on special occasion"   Drug use: No   Sexual activity: Never  Other Topics Concern   Not on file  Social History Narrative   Lives at home with her husband.   Caffeine 6-7 cups caffeine daily   Left-handed.   Social Determinants of Health   Financial Resource Strain: Not on file  Food Insecurity: Not on file  Transportation Needs: Not on file  Physical Activity: Not on file  Stress: Not on file  Social Connections: Not on file  Intimate Partner Violence: Not on file    PHYSICAL EXAM  Vitals:   09/29/21 1511  BP: (!) 147/87  Pulse: 69  Weight: 165 lb (74.8 kg)  Height: '5\' 2"'$  (1.575 m)   Body mass index is 30.18 kg/m.  Generalized: Well developed, in no acute distress  Neurological examination  Mentation: Alert oriented to time, place, is a poor historian, thoughts are scattered, follows commands, speech is intermittently slurred Cranial nerve II-XII: Pupils were equal round reactive to light. Extraocular movements were full but with looking to the left medially, wants her eyes want to close, visual field were full on confrontational test. Facial sensation and strength were normal. Head turning and shoulder shrug  were normal and symmetric. Motor: left sided weakness arm and leg that is giveaway  Sensory: Sensory testing is intact to soft touch on all 4 extremities. No evidence of extinction is noted.  Coordination: Cerebellar testing reveals good finger-nose-finger and heel-to-shin bilaterally.  Gait and station: Gait is slightly wide-based, is independent and steady Reflexes: Deep tendon reflexes are symmetric but slightly increased  DIAGNOSTIC DATA (LABS, IMAGING, TESTING) - I reviewed patient records, labs, notes, testing and imaging myself where  available.  Lab Results  Component Value Date   WBC 9.9 09/16/2020   HGB 13.3 09/16/2020   HCT 40.0 09/16/2020   MCV 86.7 09/16/2020   PLT 234.0 09/16/2020      Component Value Date/Time   NA 141 09/16/2020 1353   NA 145 (H) 03/25/2020 1204   K 3.8 09/16/2020 1353   CL 104 09/16/2020 1353   CO2 26 09/16/2020 1353   GLUCOSE 93 09/16/2020 1353   BUN 23 09/16/2020 1353   BUN 15 03/25/2020 1204   CREATININE 0.67 09/16/2020 1353   CALCIUM 9.6 09/16/2020 1353   PROT 6.5 09/16/2020 1353   PROT 6.7 03/25/2020 1204   ALBUMIN 4.4 09/16/2020 1353   ALBUMIN 4.4 03/25/2020 1204   AST 23 09/16/2020 1353   ALT 24 09/16/2020 1353   ALKPHOS 72 09/16/2020 1353   BILITOT 0.5 09/16/2020 1353   BILITOT 0.4 03/25/2020 1204   GFRNONAA >60 05/27/2020 1829   GFRAA 108 03/25/2020 1204   Lab Results  Component Value Date   CHOL 180 10/04/2019   HDL 63 10/04/2019   LDLCALC 106 (H) 10/04/2019   TRIG 57 10/04/2019   CHOLHDL 2.9 10/04/2019   Lab Results  Component Value Date   HGBA1C 6.1 09/16/2020   Lab Results  Component Value Date   VITAMINB12 1,246 (H) 10/04/2019   Lab Results  Component Value Date   TSH 4.460 10/04/2019   Evangeline Dakin, DNP  [facility] [address] [place], [place] [address] [phone]

## 2021-09-29 NOTE — Patient Instructions (Addendum)
Check MRI of the brain  Stop Aimovig, try Nurtec for headache prevention, taking 1 tablet every other day  Use Maxalt only for severe headaches, only treat headaches no more than 2-3 days a week

## 2021-09-30 ENCOUNTER — Telehealth: Payer: Self-pay

## 2021-09-30 NOTE — Telephone Encounter (Signed)
A PA has been started on CMM for Nurtec 75 MG tablet. Awaiting approval.  (Key #: [id number])

## 2021-10-04 NOTE — Telephone Encounter (Signed)
PA was denied. After review, there was a clinical error in documentation for one of the questions. I was able to speak with a representative who was able to assist in correcting the information. The PA is under review again. Ref #: [id number]. Fax #: [phone].

## 2021-10-05 ENCOUNTER — Telehealth: Payer: Self-pay | Admitting: Neurology

## 2021-10-05 NOTE — Telephone Encounter (Signed)
[facility] NPR case #[id number] sent to [facility] for cochlear implant

## 2021-10-06 NOTE — Telephone Encounter (Signed)
PA for nurtec has ben approved.  PA case # [id number] # approved for 30 day # 18.  Approval effective from 10/04/2021-10/05/2022.

## 2021-10-06 NOTE — Telephone Encounter (Signed)
Attempted to call patient's husband, [name] (as per [facility]). VM not set up.

## 2021-10-06 NOTE — Telephone Encounter (Signed)
I attempted to call the patient to inform her of the PA approval. The primary phone number is no longer being used by the patient. I attempted to call the patient's husband, Jasmine [name] (as per [facility]). VM is not set up. WCB.

## 2021-10-07 ENCOUNTER — Telehealth: Payer: Self-pay | Admitting: Neurology

## 2021-10-07 MED ORDER — AIMOVIG 140 MG/ML ~~LOC~~ SOAJ: 140.0000 mg | SUBCUTANEOUS | 11 refills | Status: DC

## 2021-10-07 NOTE — Telephone Encounter (Signed)
The patient was notified of the approval for Nurtec. She decided she would rather stay on Aimovig '140mg'$ . She is concerned about the side effects of Nurtec. Per vo by Judson Roch, okay to d/c Nurtec rx and resend Aimovig '140mg'$ . Pharmacy will be updated.

## 2021-10-07 NOTE — Telephone Encounter (Signed)
Pt is calling and said she wants to stay on the [place], Pt said her insurance would not pay for the Nurtec. Pt said she will call back when she needs a refill.

## 2021-10-14 ENCOUNTER — Other Ambulatory Visit (HOSPITAL_COMMUNITY): Payer: Self-pay | Admitting: Neurology

## 2021-10-14 DIAGNOSIS — G43009 Migraine without aura, not intractable, without status migrainosus: Secondary | ICD-10-CM

## 2021-11-08 ENCOUNTER — Ambulatory Visit (HOSPITAL_COMMUNITY)
Admission: RE | Admit: 2021-11-08 | Discharge: 2021-11-08 | Disposition: A | Discharge status: Home / Self Care | Payer: 59 | Source: Ambulatory Visit | Attending: Neurology | Admitting: Neurology

## 2021-11-08 ENCOUNTER — Ambulatory Visit (HOSPITAL_COMMUNITY): Admission: RE | Admit: 2021-11-08 | Payer: 59 | Source: Ambulatory Visit

## 2021-11-08 DIAGNOSIS — G43009 Migraine without aura, not intractable, without status migrainosus: Secondary | ICD-10-CM | POA: Diagnosis present

## 2021-11-10 ENCOUNTER — Encounter: Payer: Self-pay | Admitting: Neurology

## 2022-02-02 ENCOUNTER — Ambulatory Visit (INDEPENDENT_AMBULATORY_CARE_PROVIDER_SITE_OTHER): Payer: 59 | Admitting: Neurology

## 2022-02-02 ENCOUNTER — Encounter: Payer: Self-pay | Admitting: Neurology

## 2022-02-02 ENCOUNTER — Telehealth: Payer: Self-pay | Admitting: Neurology

## 2022-02-02 VITALS — BP 130/77 | HR 70 | Ht 63.0 in | Wt 168.8 lb

## 2022-02-02 DIAGNOSIS — G43009 Migraine without aura, not intractable, without status migrainosus: Secondary | ICD-10-CM

## 2022-02-02 NOTE — Telephone Encounter (Signed)
Patient needs to be seen in one year just for migraine management. She is doing well, I saw her today, please cancel her March appointment with Judson Roch. Also change her appointment in 01/2023 from myself to sarah slack thanks

## 2022-02-02 NOTE — Progress Notes (Signed)
PATIENT: Jasmine [name] DOB: 12-Jun-1955  REASON FOR VISIT: Follow up for migraines HISTORY FROM: Patient, husband PRIMARY NEUROLOGIST: Dr. Jaynee [name]     HISTORY OF PRESENT ILLNESS:  Update 02/02/2022: Here for review of MRi brain which was unremarkable. She has a Persistent left mastoid effusion. Otherwise, no abnormality identified. I explained this to husband and patient., reviewed anatomy, causes, answered all questions She can follow up with ENT for this. Otherwise she is doing well on her migraine management, will refill meds.    Patient complains of symptoms per HPI as well as the following symptoms: cochlear implant . Pertinent negatives and positives per HPI. All others negative    [name] 09/29/2021 Here [name] with her husband. Seen for sooner appointment. Claims referred from cardiologist who she was seeing for CP for vertigo. Vertigo started 18 weeks ago. Feels the room is spinning when she bends over and gets weak. Had cardiac stress test was normal reportedly. Felt CP was related to BP flu cations. Remains on Aimovig 140 mg monthly injection. Last injection was in June, prior to that missed several months. Her husband has fear of injecting his wife. Has had few migraines, not sure how many. Takes Maxalt, uses all 10 tablets monthly. It does help. Headache is to the top right vertex. Feels vision toward end of the day is blurry, has seen [facility] doctor. Most times has bad headache, sensitive to light, sound, nausea. Concerned with functioning of the brain, hard time concentrating, focusing. Feels like she is weak. Feels these symptoms over the last 18 weeks. No longer on oxycodone. Towards the end of the day her speech is slurred. PCP gives Lyrica for fibro. Is most concerned with the functioning of her brain, something not right neurologically. Has left cochlear implant. No falls. 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 Her speech is slurred at times. She brought 3 cushions to sit  on.  She and her husband are poor historians. She cancelled this appointment, then arrived 2 minutes before appointment time to be seen.   Update 05/19/21 SS: Jasmine [name] for follow-up. Has about 1-2 migraines a week, on Aimovig 140 mg monthly, tolerates well. Takes maxalt for acute headache, works well. Takes the Lyrica up to 200 mg 3 times daily for fibro pain, it helps. Goes to [facility] when injection of low back is needed. Vertigo flares intermittently. Takes meclizine with good benefit. Brought her own cushions for the chair. Sees [facility], but claims not taking any "strong" medications. PCP will prescribe Lyrica for her. On prednisone for sinus infection currently. Has left cochlear implant.  HISTORY  11/16/2020 Dr. [name]: [name] is a 66 year old left-handed white female with a history of migraine headaches.  The patient is on Aimovig with some benefit.  She is having 1 or 2 headaches a week, about twice a month the headaches may be more incapacitating.  The patient is tolerating Aimovig well.  She has Maxalt, but she has run out of the medication.  This was helpful for her headache.  The patient has fibromyalgia, she was switched to Lyrica which seems to have worked much better for her fibromyalgia pain.  She is being followed through Dr. Tonita Cong from orthopedic surgery for back discomfort.  She has been referred to Dr. Nelva Bush after MRI evaluation of the back.  She apparently had an exacerbation of back pain after she lifted a 25 pound bag of mulch on 30 September 2020.  REVIEW OF SYSTEMS:  Out of a complete 14 system review of symptoms, the patient complains only of the following symptoms, and all other reviewed systems are negative.  See HPI  ALLERGIES: Allergies  Allergen Reactions   Amlodipine Shortness Of Breath    Pt states she also had very bad pain    Clonidine Palpitations   Diclofenac Nausea And Vomiting and Nausea Only   Hydrochlorothiazide Shortness Of Breath    Sulfa Antibiotics Hives and Rash   Famciclovir Other (See Comments)   Metronidazole Nausea And Vomiting   Soma [Carisoprodol] Other (See Comments)    Moody and sleepy Cognitive Function, Daytime sleepiness   Acyclovir    Acyclovir And Related    Benadryl [Diphenhydramine Hcl]     Severe emotional reaction and doesn't work well with Pt. Past history   Cephalosporins Hives   Codeine Other (See Comments)    Makes patient feel odd ; "sometimes mild; sometimes severe reaction; mostly severe"   Cyclobenzaprine Other (See Comments)    Muscle Aches.   Cymbalta [Duloxetine Hcl]     Stomach upset   Diclofenac Sodium Nausea And Vomiting   Dicyclomine    Hydrocodone    Hydrocodone-Acetaminophen    Linzess [Linaclotide] Diarrhea   Nitrofurantoin Monohyd Macro     Felt funny   Suboxone [Buprenorphine Hcl-Naloxone Hcl] Nausea And Vomiting   Tizanidine     Other reaction(s): Other (See Comments) insomnia   Tramadol    Baclofen Nausea Only   Drug Ingredient [Ganciclovir] Rash   Meloxicam     Other reaction(s): Confusion   Sulfamethoxazole Rash    HOME MEDICATIONS: Outpatient Medications Prior to Visit  Medication Sig Dispense Refill   azelastine (ASTELIN) 0.1 % nasal spray Place 1 spray into both nostrils 2 (two) times daily. Use in each nostril as directed 30 mL 6   azelastine (ASTELIN) 0.1 % nasal spray Place into the nose.     B COMPLEX VITAMINS PO Take by mouth.     diclofenac Sodium (VOLTAREN) 1 % GEL Apply 2 g topically 4 (four) times daily. 100 g 1   Erenumab-aooe (AIMOVIG) 140 MG/ML SOAJ Inject 140 mg into the skin every 30 (thirty) days. 1 mL 11   felodipine (PLENDIL) 10 MG 24 hr tablet Take 1 tablet (10 mg total) by mouth daily. 90 tablet 3   fluticasone (FLONASE) 50 MCG/ACT nasal spray SPRAY TWO SPRAYS IN EACH NOSTRIL ONCE DAILY 16 mL 0   Fluticasone-Salmeterol (ADVAIR) 100-50 MCG/DOSE AEPB      levalbuterol (XOPENEX HFA) 45 MCG/ACT inhaler Inhale 2 puffs into the lungs every  8 (eight) hours as needed for wheezing. 1 each 3   losartan (COZAAR) 100 MG tablet TAKE ONE TABLET BY MOUTH DAILY (Patient taking differently: 50 mg.) 30 tablet 3   lubiprostone (AMITIZA) 8 MCG capsule TAKE ONE CAPSULE BY MOUTH TWICE A DAY WITH A MEAL     meclizine (ANTIVERT) 25 MG tablet Take 1 tablet (25 mg total) by mouth 3 (three) times daily as needed for dizziness. 30 tablet 3   Menthol (BIOFREEZE) 5 % PTCH Apply 1 patch topically as needed.     metoprolol succinate (TOPROL-XL) 25 MG 24 hr tablet TAKE ONE TABLET BY MOUTH DAILY (Patient taking differently: 12.5 mg.) 90 tablet 1   Multiple Vitamin (MULTIVITAMIN PO) Take by mouth.     naproxen (NAPROSYN) 500 MG tablet Take 1 tablet (500 mg total) by mouth every 12 (twelve) hours as needed. 60 tablet 2   omeprazole (PRILOSEC) 40 MG capsule  Take 1 capsule (40 mg total) by mouth daily. 90 capsule 3   orphenadrine (NORFLEX) 100 MG tablet TAKE ONE TABLET BY MOUTH TWICE A DAY AS NEEDED FOR MUSCLE SPASMS 60 tablet 0   oxyCODONE (OXY IR/ROXICODONE) 5 MG immediate release tablet Take 5 mg by mouth daily as needed.     pregabalin (LYRICA) 200 MG capsule Take 1 capsule (200 mg total) by mouth 3 (three) times daily. 90 capsule 5   rizatriptan (MAXALT) 10 MG tablet Take one tablet up to three times a day if needed 10 tablet 11   rosuvastatin (CRESTOR) 20 MG tablet TAKE ONE TABLET BY MOUTH EVERY EVENING 30 tablet 1   temazepam (RESTORIL) 15 MG capsule 30 mg.     traZODone (DESYREL) 50 MG tablet Take 50 mg by mouth at bedtime.     zolpidem (AMBIEN) 10 MG tablet Take 2 tablets by mouth at bedtime.      No facility-administered medications prior to visit.    PAST MEDICAL HISTORY: Past Medical History:  Diagnosis Date   Achilles tendinosis of left lower extremity 01/23/2017   Anxiety    Asthma    Benign paroxysmal positional vertigo 01/05/2016   Bilateral sensorineural hearing loss 12/22/2014   Blepharitis of upper and lower eyelids of both eyes  04/27/2016   Bradycardia 09/22/2014   Cervicogenic headache 08/24/2016   Chronic high back pain    CKD (chronic kidney disease) 12/08/2014   Cochlear implant in place    Colon polyp    Common migraine with intractable migraine 02/19/2020   Constipation    Diverticulitis    Diverticulosis    DJD (degenerative joint disease)    Double vision    Dry eye syndrome of both lacrimal glands 04/27/2016   Fibromyalgia    Heart murmur    High cholesterol    Hypercalcemia    Hypercholesterolemia    Hyperlipidemia 02/01/2016   Hypertension    Hypoglycemia    Insomnia 09/22/2010   Kidney stone    Meniscus tear    Right   Migraine 08/07/2015   Mild intermittent asthma without complication 07/37/1062   Nephrolithiasis 03/17/2014   Nocturnal leg cramps 05/31/2019   Osteoporosis    Primary hyperparathyroidism ([place]) 12/10/2014   PTSD (post-traumatic stress disorder)    Renal cyst 05/12/2013   Schizophrenia ([place])    treated by Dr. Casimiro Needle   Schizophrenia [facility])    Seizures ([place])    last seizure 3 years ago, not on AED   Stroke ([place]) 07/08/11   "I've had mini strokes before; left side of face  is more down than right "   Syncope and collapse    One episode - 07/10/15   TIA (transient ischemic attack)     PAST SURGICAL HISTORY: Past Surgical History:  Procedure Laterality Date   CATARACT EXTRACTION     COCHLEAR IMPLANT  2010   left   DILATION AND CURETTAGE OF UTERUS     HERNIA REPAIR     INGUINAL HERNIA REPAIR  1985   left   KIDNEY STONE SURGERY  ~ 2006   PARATHYROIDECTOMY     skin cancer removal     TONSILLECTOMY     "as a child"   tooth removal     WISDOM TOOTH EXTRACTION      FAMILY HISTORY: Family History  Problem Relation Age of Onset   Leukemia Mother 97       Deceased   Heart attack Father 33  Living   Arthritis Father    Skin cancer Father    Hypertension Father    Hyperlipidemia Father    Fibromyalgia Sister    Neuropathy Sister    Headache Sister    HIV  Sister    Fibromyalgia Sister    Neuropathy Sister    Headache Sister    Arthritis/Rheumatoid Sister    Neuropathy Sister    Thyroid disease Sister    Neuropathy Sister    Neuropathy Brother        Peripheal   Alzheimer's disease Paternal Aunt        X2   Stomach cancer Paternal Aunt        x1   Arthritis/Rheumatoid Paternal Aunt        x1   Diabetes Paternal Grandmother     SOCIAL HISTORY: Social History   Socioeconomic History   Marital status: Married    Spouse name: [name]   Number of children: 3   Years of education: 2 yrs coll   Highest education level: Not on file  Occupational History   Occupation: Disabled  Tobacco Use   Smoking status: Former    Packs/day: 1.00    Years: 3.00    Total pack years: 3.00    Types: Cigarettes    Quit date: 02/29/2004    Years since quitting: 17.9   Smokeless tobacco: Never  Vaping Use   Vaping Use: Never used  Substance and Sexual Activity   Alcohol use: Not Currently    Comment: "glass of wine q once in awhile; not very often; do it on special occasion"   Drug use: No   Sexual activity: Never  Other Topics Concern   Not on file  Social History Narrative   Lives at home with her husband.   Caffeine 6-7 cups caffeine daily   Left-handed.   Social Determinants of Health   Financial Resource Strain: Not on file  Food Insecurity: Not on file  Transportation Needs: Not on file  Physical Activity: Not on file  Stress: Not on file  Social Connections: Not on file  Intimate Partner Violence: Not on file    PHYSICAL EXAM  Vitals:   02/02/22 1053  BP: 130/77  Pulse: 70  Weight: 168 lb 12.8 oz (76.6 kg)  Height: '5\' 3"'$  (1.6 m)    Body mass index is 29.9 kg/m.  Physical exam: Exam: Gen: NAD, conversant      CV: No apparent palpitations or chest pain or SOB. VS: Breathing at a normal rate. Weight appears obese. Not febrile. Eyes: Conjunctivae clear without exudates or hemorrhage  Neuro: Detailed Neurologic  Exam  Speech:    Speech is normal; fluent and spontaneous with normal comprehension.  Cognition:    The patient is oriented to person, place, and time;     recent and remote memory intact;     language fluent;     normal attention, concentration,     fund of knowledge Cranial Nerves:    The pupils are equal, round, and reactive to light. Visual fields appear full. Extraocular movements are intact.  The face is symmetric. Voice is normal.   Coordination:    No dysmetria or ataxia noted  Gait:    Normal native gait  Motor Observation:   no involuntary movements noted. Tone:    Appears normal  Posture:    Posture is normal. normal erect    Strength:    Strength is anti-gravity and symmetric in the upper and lower  limbs.       DIAGNOSTIC DATA (LABS, IMAGING, TESTING) - I reviewed patient records, labs, notes, testing and imaging myself where available.  Lab Results  Component Value Date   WBC 9.9 09/16/2020   HGB 13.3 09/16/2020   HCT 40.0 09/16/2020   MCV 86.7 09/16/2020   PLT 234.0 09/16/2020      Component Value Date/Time   NA 141 09/16/2020 1353   NA 145 (H) 03/25/2020 1204   K 3.8 09/16/2020 1353   CL 104 09/16/2020 1353   CO2 26 09/16/2020 1353   GLUCOSE 93 09/16/2020 1353   BUN 23 09/16/2020 1353   BUN 15 03/25/2020 1204   CREATININE 0.67 09/16/2020 1353   CALCIUM 9.6 09/16/2020 1353   PROT 6.5 09/16/2020 1353   PROT 6.7 03/25/2020 1204   ALBUMIN 4.4 09/16/2020 1353   ALBUMIN 4.4 03/25/2020 1204   AST 23 09/16/2020 1353   ALT 24 09/16/2020 1353   ALKPHOS 72 09/16/2020 1353   BILITOT 0.5 09/16/2020 1353   BILITOT 0.4 03/25/2020 1204   GFRNONAA >60 05/27/2020 1829   GFRAA 108 03/25/2020 1204   Lab Results  Component Value Date   CHOL 180 10/04/2019   HDL 63 10/04/2019   LDLCALC 106 (H) 10/04/2019   TRIG 57 10/04/2019   CHOLHDL 2.9 10/04/2019   Lab Results  Component Value Date   HGBA1C 6.1 09/16/2020   Lab Results  Component Value Date    VITAMINB12 1,246 (H) 10/04/2019   Lab Results  Component Value Date   TSH 4.460 10/04/2019    ASSESSMENT AND PLAN 66 y.o. year old female   1.  Migraine headache - Update 02/02/2022: Here for review of MRi brain which was unremarkable. She has a Persistent left mastoid effusion. Otherwise, no abnormality identified. I explained this to husband and patient. She can follow up with ENT for this. Otherwise she is doing well on her migraine management, will refill meds.   We will be managing patient for her migraines only going forward. She has been thoroughly evaluated by neurology, she is already seeing ophthalmology, ENT, pain clinic and pcp  -Continue current migraine management, doing well, f/u one year -Continue Maxalt as needed for acute headache, discussed potential for rebound headache, not to treat headache more than 2 to 3 days a week -She has 28 medication allergies/interaction  Fibromyalgia - RETURN TO PAIN MANAGEMENT AND PCP Chronic low back pain - RETURN TO PAIN MANAGEMENT AND PCP  Sarina Ill, MD [facility] [address], [place], [place] [address] [phone]  I spent 30 minutes of face-to-face and non-face-to-face time with patient on the  1. Migraine without aura and without status migrainosus, not intractable    diagnosis.  This included previsit chart review, lab review, study review, order entry, electronic health record documentation, patient education on the different diagnostic and therapeutic options, counseling and coordination of care, risks and benefits of management, compliance, or risk factor reduction

## 2022-02-02 NOTE — Patient Instructions (Addendum)
F/u one year for migraine management

## 2022-02-03 NOTE — Telephone Encounter (Signed)
Unable to LVM, VM box full. Sent mychart msg informing pt of appointment changes

## 2022-04-12 ENCOUNTER — Telehealth: Payer: Self-pay | Admitting: Neurology

## 2022-04-12 NOTE — Telephone Encounter (Signed)
I called pt at her #.  I could not connect.

## 2022-04-12 NOTE — Telephone Encounter (Signed)
Pt called wanting to discuss BOTOX injections.

## 2022-04-13 NOTE — Telephone Encounter (Signed)
I called pt at her # for 2nd attempt.  I could not connect either.   Sent [employer].

## 2022-05-24 ENCOUNTER — Ambulatory Visit: Payer: 59 | Admitting: Neurology

## 2022-06-08 ENCOUNTER — Telehealth: Payer: Self-pay | Admitting: Neurology

## 2022-06-08 NOTE — Telephone Encounter (Signed)
Pt called needing to speak to the RN regarding numbness she is experiencing on her face. Please advise.

## 2022-06-08 NOTE — Telephone Encounter (Signed)
I called pt.  She asked for SS/ NP.  I relayed message was sent to [place] since Dr. Lucia Gaskins saw her last for migraines.  Pt has seen ENT, she has a L cochlear implant from her last MRI.  She c/o of constant numbness in L side of head for the last 3.5 weeks.  No pain, just numbness.  (From L cheek to L side face).  She mentioned that she rolled off bed and landed on elbow, she did not hit her head.  She had seen ENT, and they referred her to neuro for eval.  I relayed this a new problem our office policy to have referral sent to [place] by ENT and we are happy to see (MD) to see initially then possible go back to NP afterwards per MD.  She verbalized understanding.

## 2022-09-28 ENCOUNTER — Other Ambulatory Visit: Payer: Self-pay

## 2022-09-28 DIAGNOSIS — G43019 Migraine without aura, intractable, without status migrainosus: Secondary | ICD-10-CM

## 2022-09-28 MED ORDER — RIZATRIPTAN BENZOATE 10 MG PO TABS
ORAL_TABLET | ORAL | 11 refills | Status: DC
Start: 2022-09-28 — End: 2023-05-10

## 2022-10-21 ENCOUNTER — Other Ambulatory Visit: Payer: Self-pay

## 2022-10-21 ENCOUNTER — Emergency Department (HOSPITAL_BASED_OUTPATIENT_CLINIC_OR_DEPARTMENT_OTHER)
Admission: EM | Admit: 2022-10-21 | Discharge: 2022-10-21 | Disposition: A | Discharge status: Home / Self Care | Payer: 59 | Source: Home / Self Care | Attending: Emergency Medicine | Admitting: Emergency Medicine

## 2022-10-21 ENCOUNTER — Emergency Department (HOSPITAL_BASED_OUTPATIENT_CLINIC_OR_DEPARTMENT_OTHER): Payer: 59

## 2022-10-21 ENCOUNTER — Encounter (HOSPITAL_BASED_OUTPATIENT_CLINIC_OR_DEPARTMENT_OTHER): Payer: Self-pay | Admitting: Emergency Medicine

## 2022-10-21 DIAGNOSIS — S20212A Contusion of left front wall of thorax, initial encounter: Secondary | ICD-10-CM

## 2022-10-21 DIAGNOSIS — M25522 Pain in left elbow: Secondary | ICD-10-CM | POA: Diagnosis not present

## 2022-10-21 DIAGNOSIS — M546 Pain in thoracic spine: Secondary | ICD-10-CM | POA: Insufficient documentation

## 2022-10-21 DIAGNOSIS — N189 Chronic kidney disease, unspecified: Secondary | ICD-10-CM | POA: Diagnosis not present

## 2022-10-21 DIAGNOSIS — W108XXA Fall (on) (from) other stairs and steps, initial encounter: Secondary | ICD-10-CM | POA: Insufficient documentation

## 2022-10-21 DIAGNOSIS — Z79899 Other long term (current) drug therapy: Secondary | ICD-10-CM | POA: Diagnosis not present

## 2022-10-21 DIAGNOSIS — Y92009 Unspecified place in unspecified non-institutional (private) residence as the place of occurrence of the external cause: Secondary | ICD-10-CM | POA: Insufficient documentation

## 2022-10-21 DIAGNOSIS — S20302A Unspecified superficial injuries of left front wall of thorax, initial encounter: Secondary | ICD-10-CM | POA: Diagnosis present

## 2022-10-21 DIAGNOSIS — Y9301 Activity, walking, marching and hiking: Secondary | ICD-10-CM | POA: Diagnosis not present

## 2022-10-21 MED ORDER — MORPHINE SULFATE (PF) 2 MG/ML IV SOLN
2.0000 mg | Freq: Once | INTRAVENOUS | Status: AC
  Administered 2022-10-21: 2 mg via INTRAMUSCULAR
  Filled 2022-10-21: qty 1

## 2022-10-21 NOTE — ED Notes (Signed)
Patient transported to X-ray 

## 2022-10-21 NOTE — Discharge Instructions (Addendum)
As discussed, your imaging shows no acute fractures. You most likely have a rib contusion causing your pain. Alternate between Tylenol and Ibuprofen every 4-6 hours as needed for pain.  Please follow up with your PCP in 3-5 days for reevaluation of your symptoms.  Get help right away if you: Have difficulty breathing or shortness of breath. Develop a continual cough, or you cough up thick or bloody mucus from your lungs (sputum). Feel nauseous or you vomit. Have pain in your abdomen.

## 2022-10-21 NOTE — ED Provider Notes (Signed)
[facility] Provider Note   CSN: [id number] Arrival date & time: 10/21/22  1549     History  Chief Complaint  Patient presents with   Flank Pain    Jasmine [name] is a 67 y.o. female with a history of stroke, seizures, chronic kidney disease, and schizophrenia who presents to the ED today after a fall.  Patient reports she was walking down steps she missed the last one and fell forward onto the ground.  She reports that she landed on the left side of the chest but denies hitting her head or loss of consciousness.  She is not on any blood thinners.  Additionally, patient has pain to the lateral upper back and left elbow.  Patient took both Ibuprofen and Lyrica at home, without any relief of symptoms.  Denies any other complaints or concerns at this time.    Home Medications Prior to Admission medications   Medication Sig Start Date End Date Taking? Authorizing Provider  azelastine (ASTELIN) 0.1 % nasal spray Place 1 spray into both nostrils 2 (two) times daily. Use in each nostril as directed 05/19/20   Janeece Agee, NP  azelastine (ASTELIN) 0.1 % nasal spray Place into the nose. 05/19/20   [provider]  B COMPLEX VITAMINS PO Take by mouth.    [provider]  diclofenac Sodium (VOLTAREN) 1 % GEL Apply 2 g topically 4 (four) times daily. 08/21/20   Janeece Agee, NP  Erenumab-aooe (AIMOVIG) 140 MG/ML SOAJ Inject 140 mg into the skin every 30 (thirty) days. 10/07/21   Glean Salvo, NP  felodipine (PLENDIL) 10 MG 24 hr tablet Take 1 tablet (10 mg total) by mouth daily. 01/03/20   Janeece Agee, NP  fluticasone Aleda Grana) 50 MCG/ACT nasal spray SPRAY TWO SPRAYS IN EACH NOSTRIL ONCE DAILY 06/03/21   Janeece Agee, NP  Fluticasone-Salmeterol (ADVAIR) 100-50 MCG/DOSE AEPB     [provider]  levalbuterol Pauline Aus HFA) 45 MCG/ACT inhaler Inhale 2 puffs into the lungs every 8 (eight) hours as needed for wheezing.  02/27/20   Just, Azalee Course, FNP  losartan (COZAAR) 100 MG tablet TAKE ONE TABLET BY MOUTH DAILY Patient taking differently: 50 mg. 10/07/20   Janeece Agee, NP  lubiprostone (AMITIZA) 8 MCG capsule TAKE ONE CAPSULE BY MOUTH TWICE A DAY WITH A MEAL 09/07/20   [provider]  meclizine (ANTIVERT) 25 MG tablet Take 1 tablet (25 mg total) by mouth 3 (three) times daily as needed for dizziness. 09/23/20   York Spaniel, MD  Menthol (BIOFREEZE) 5 % [facility] Apply 1 patch topically as needed.    [provider]  metoprolol succinate (TOPROL-XL) 25 MG 24 hr tablet TAKE ONE TABLET BY MOUTH DAILY Patient taking differently: 12.5 mg. 08/05/20   Janeece Agee, NP  Multiple Vitamin (MULTIVITAMIN PO) Take by mouth.    [provider]  naproxen (NAPROSYN) 500 MG tablet Take 1 tablet (500 mg total) by mouth every 12 (twelve) hours as needed. 11/16/20   York Spaniel, MD  omeprazole (PRILOSEC) 40 MG capsule Take 1 capsule (40 mg total) by mouth daily. 01/03/20   Janeece Agee, NP  orphenadrine (NORFLEX) 100 MG tablet TAKE ONE TABLET BY MOUTH TWICE A DAY AS NEEDED FOR MUSCLE SPASMS 11/05/20   Janeece Agee, NP  oxyCODONE (OXY IR/ROXICODONE) 5 MG immediate release tablet Take 5 mg by mouth daily as needed. 08/26/20   [provider]  pregabalin (LYRICA) 200 MG capsule Take  1 capsule (200 mg total) by mouth 3 (three) times daily. 01/14/21   Levert Feinstein, MD  rizatriptan (MAXALT) 10 MG tablet Take one tablet up to three times a day if needed 09/28/22   Glean Salvo, NP  rosuvastatin (CRESTOR) 20 MG tablet TAKE ONE TABLET BY MOUTH EVERY EVENING 10/20/20   Janeece Agee, NP  temazepam (RESTORIL) 15 MG capsule 30 mg.    [provider]  traZODone (DESYREL) 50 MG tablet Take 50 mg by mouth at bedtime. 09/19/20   [provider]  zolpidem (AMBIEN) 10 MG tablet Take 2 tablets by mouth at bedtime.  04/06/16   [provider]      Allergies    Amlodipine,  Clonidine, Diclofenac, Hydrochlorothiazide, Sulfa antibiotics, Famciclovir, Metronidazole, Soma [carisoprodol], Acyclovir, Acyclovir and related, Benadryl [diphenhydramine hcl], Cephalosporins, Codeine, Cyclobenzaprine, Cymbalta [duloxetine hcl], Diclofenac sodium, Dicyclomine, Hydrocodone, Hydrocodone-acetaminophen, Linzess [linaclotide], Nitrofurantoin monohyd macro, Suboxone [buprenorphine hcl-naloxone hcl], Tizanidine, Tramadol, Baclofen, Drug ingredient [ganciclovir], Meloxicam, and Sulfamethoxazole    Review of Systems   Review of Systems  Musculoskeletal:        Rib pain  All other systems reviewed and are negative.   Physical Exam Updated Vital Signs BP (!) 158/91 (BP Location: Right Arm)   Pulse 89   Temp 98.1 F (36.7 C) (Oral)   Resp 16   Ht 5\' 3"  (1.6 m)   Wt 79.8 kg   SpO2 99%   BMI 31.18 kg/m  Physical Exam Vitals and nursing note reviewed.  Constitutional:      Appearance: Normal appearance.  HENT:     Head: Normocephalic and atraumatic.     Mouth/Throat:     Mouth: Mucous membranes are moist.  Eyes:     Conjunctiva/sclera: Conjunctivae normal.     Pupils: Pupils are equal, round, and reactive to light.  Cardiovascular:     Rate and Rhythm: Normal rate and regular rhythm.     Pulses: Normal pulses.     Heart sounds: Normal heart sounds.  Pulmonary:     Effort: Pulmonary effort is normal.     Breath sounds: Normal breath sounds.  Abdominal:     Palpations: Abdomen is soft.     Tenderness: There is no abdominal tenderness.  Musculoskeletal:        General: Tenderness present.     Cervical back: Normal range of motion and neck supple. No rigidity or tenderness.     Comments: Tenderness to palpation of the left lateral chest wall beneath the breast as well as the left posterior ribs, just distal to the scapula. She maintains full range of motion of the left upper extremity with tenderness to the olecranon process. Strength and sensation intact. Palpable radial  pulses.  Skin:    General: Skin is warm and dry.     Findings: No bruising, erythema or rash.  Neurological:     General: No focal deficit present.     Mental Status: She is alert.     Sensory: No sensory deficit.     Motor: No weakness.  Psychiatric:        Mood and Affect: Mood normal.        Behavior: Behavior normal.     ED Results / Procedures / Treatments   Labs (all labs ordered are listed, but only abnormal results are displayed) Labs Reviewed - No data to display  EKG None  Radiology DG Ribs Unilateral Left  Result Date: 10/21/2022 CLINICAL DATA:  Fall today.  Left rib and  chest pain. EXAM: LEFT RIBS - 2 VIEW COMPARISON:  None Available. FINDINGS: No fracture or other bone lesions are seen involving the ribs. IMPRESSION: Negative. Electronically Signed   By: Danae Orleans M.D.   On: 10/21/2022 18:57   DG ELBOW COMPLETE LEFT (3+VIEW)  Result Date: 10/21/2022 CLINICAL DATA:  Fall today.  Left elbow pain. EXAM: LEFT ELBOW - COMPLETE 3+ VIEW COMPARISON:  None Available. FINDINGS: Normal bone mineralization. Minimal degenerative spurring at the lateral tip of the radial head on lateral view in the volar tip of the radial head on lateral view. No elbow joint effusion. No acute fracture is seen. No dislocation. Joint spaces are preserved. IMPRESSION: 1. No acute fracture. 2. Minimal degenerative spurring at the peripheral radial head. Electronically Signed   By: Neita Garnet M.D.   On: 10/21/2022 17:41   DG Chest 2 View  Result Date: 10/21/2022 CLINICAL DATA:  Fall today. Left side and flank pain. Left elbow pain. EXAM: CHEST - 2 VIEW COMPARISON:  Chest radiographs 05/27/2020 FINDINGS: Cardiac silhouette and mediastinal contours are within normal limits. The lungs are clear. No pleural effusion or pneumothorax. Mild dextrocurvature of the midthoracic spine and mild levocurvature of the thoracolumbar junction. Mild multilevel degenerative disc changes of the thoracic spine. No  acute displaced left-sided rib fracture is seen. IMPRESSION: 1. No acute cardiopulmonary disease process. 2. No acute displaced left-sided rib fracture is seen. Electronically Signed   By: Neita Garnet M.D.   On: 10/21/2022 17:39    Procedures Procedures: not indicated.   Medications Ordered in ED Medications  morphine (PF) 2 MG/ML injection 2 mg (2 mg Intramuscular Given 10/21/22 1624)    ED Course/ Medical Decision Making/ A&P                                 Medical Decision Making Amount and/or Complexity of Data Reviewed Radiology: ordered.  Risk Prescription drug management.   This patient presents to the ED for concern of rib pain, this involves an extensive number of treatment options, and is a complaint that carries with it a high risk of complications and morbidity.   Differential diagnosis includes: fracture, dislocation, contusion, muscle strain, etc.   Comorbidities  See HPI above   Additional History  Additional history obtained from patient's previous records.   Imaging Studies  I ordered imaging studies including left elbow, chest, and left ribs x-rays  I independently visualized and interpreted imaging which showed: no acute osseous abnormalities I agree with the radiologist interpretation   Problem List / ED Course / Critical Interventions / Medication Management  Left sided chest pain underneath the breast I ordered medications including: Morphine for pain  Reevaluation of the patient after these medicines showed that the patient improved I have reviewed the patients home medicines and have made adjustments as needed   Social Determinants of Health  Tobacco use   Test / Admission - Considered  Discussed results with patient. She is hemodynamically stable and safe for discharge home. Return precautions provided.       Final Clinical Impression(s) / ED Diagnoses Final diagnoses:  Rib contusion, left, initial encounter    Rx /  DC Orders ED Discharge Orders     None         Maxwell Marion, PA-C 10/21/22 Dorthey Sawyer, MD 10/21/22 2028

## 2022-10-21 NOTE — ED Triage Notes (Signed)
Pt POV - reports falling forward and landed on left chest/side appx 1400 today.    C/o L side pain.    Speaking in clear sentences. Denies blood thinners. Denies head injury.   Took ibuprofen and lyrica.

## 2022-10-25 ENCOUNTER — Encounter (INDEPENDENT_AMBULATORY_CARE_PROVIDER_SITE_OTHER): Payer: Self-pay

## 2022-10-29 ENCOUNTER — Other Ambulatory Visit: Payer: Self-pay | Admitting: Neurology

## 2023-01-19 ENCOUNTER — Ambulatory Visit: Payer: 59 | Admitting: Neurology

## 2023-01-20 ENCOUNTER — Encounter (HOSPITAL_BASED_OUTPATIENT_CLINIC_OR_DEPARTMENT_OTHER): Payer: Self-pay

## 2023-01-20 ENCOUNTER — Emergency Department (HOSPITAL_BASED_OUTPATIENT_CLINIC_OR_DEPARTMENT_OTHER)
Admission: EM | Admit: 2023-01-20 | Discharge: 2023-01-20 | Disposition: A | Discharge status: Home / Self Care | Payer: 59 | Attending: Emergency Medicine | Admitting: Emergency Medicine

## 2023-01-20 ENCOUNTER — Other Ambulatory Visit: Payer: Self-pay

## 2023-01-20 ENCOUNTER — Emergency Department (HOSPITAL_BASED_OUTPATIENT_CLINIC_OR_DEPARTMENT_OTHER): Payer: 59

## 2023-01-20 DIAGNOSIS — W108XXA Fall (on) (from) other stairs and steps, initial encounter: Secondary | ICD-10-CM | POA: Diagnosis not present

## 2023-01-20 DIAGNOSIS — M940 Chondrocostal junction syndrome [Tietze]: Secondary | ICD-10-CM | POA: Diagnosis not present

## 2023-01-20 DIAGNOSIS — W19XXXA Unspecified fall, initial encounter: Secondary | ICD-10-CM

## 2023-01-20 DIAGNOSIS — R0781 Pleurodynia: Secondary | ICD-10-CM | POA: Diagnosis present

## 2023-01-20 MED ORDER — KETOROLAC TROMETHAMINE 15 MG/ML IJ SOLN
15.0000 mg | Freq: Once | INTRAMUSCULAR | Status: AC
  Administered 2023-01-20: 15 mg via INTRAMUSCULAR
  Filled 2023-01-20: qty 1

## 2023-01-20 MED ORDER — LIDOCAINE 5 % EX PTCH
1.0000 | MEDICATED_PATCH | CUTANEOUS | Status: DC
  Administered 2023-01-20: 1 via TRANSDERMAL
  Filled 2023-01-20: qty 1

## 2023-01-20 MED ORDER — ACETAMINOPHEN 325 MG PO TABS: 650.0000 mg | ORAL_TABLET | Freq: Four times a day (QID) | ORAL | 0 refills | Status: AC | PRN

## 2023-01-20 MED ORDER — OXYCODONE HCL 5 MG PO TABS
5.0000 mg | ORAL_TABLET | Freq: Three times a day (TID) | ORAL | 0 refills | Status: AC | PRN
Start: 2023-01-20 — End: 2023-01-23

## 2023-01-20 MED ORDER — LIDOCAINE 5 % EX PTCH: 1.0000 | MEDICATED_PATCH | CUTANEOUS | 0 refills | Status: DC

## 2023-01-20 NOTE — ED Provider Notes (Addendum)
[facility] Provider Note   CSN: [id number] Arrival date & time: 01/20/23  0272     History  Chief Complaint  Patient presents with   Rib Injury    Jasmine [name] is a 67 y.o. female.  Patient is a 67 year old female presenting from home after fall that occurred 2 days ago with residual left-sided rib pain.  Patient states she was walking down steps outside when she twisted and fell landing on her left side.  She admits to left-sided rib pain on the lateral and anterior chest wall, with breathing.  Denies any head trauma, blood thinner use, or residual nausea/vomiting/neurological deficits.  Denies skin wounds or bleeding.  The history is provided by the patient. No language interpreter was used.       Home Medications Prior to Admission medications   Medication Sig Start Date End Date Taking? Authorizing Provider  acetaminophen (TYLENOL) 325 MG tablet Take 2 tablets (650 mg total) by mouth every 6 (six) hours as needed for mild pain (pain score 1-3). 01/20/23  Yes Edwin Dada P, DO  lidocaine (LIDODERM) 5 % Place 1 patch onto the skin daily. Remove & Discard patch within 12 hours or as directed by MD 01/20/23  Yes Edwin Dada P, DO  oxyCODONE (ROXICODONE) 5 MG immediate release tablet Take 1 tablet (5 mg total) by mouth every 8 (eight) hours as needed for up to 3 days for severe pain (pain score 7-10). 01/20/23 01/23/23 Yes Edwin Dada P, DO  AIMOVIG 140 MG/ML SOAJ INJECT 140MG  UNDER THE SKIN EVERY 30 DAYS 11/01/22   Glean Salvo, NP  azelastine (ASTELIN) 0.1 % nasal spray Place 1 spray into both nostrils 2 (two) times daily. Use in each nostril as directed 05/19/20   Janeece Agee, NP  azelastine (ASTELIN) 0.1 % nasal spray Place into the nose. 05/19/20   [provider]  B COMPLEX VITAMINS PO Take by mouth.    [provider]  diclofenac Sodium (VOLTAREN) 1 % GEL Apply 2 g topically 4 (four) times daily. 08/21/20    Janeece Agee, NP  felodipine (PLENDIL) 10 MG 24 hr tablet Take 1 tablet (10 mg total) by mouth daily. 01/03/20   Janeece Agee, NP  fluticasone Aleda Grana) 50 MCG/ACT nasal spray SPRAY TWO SPRAYS IN EACH NOSTRIL ONCE DAILY 06/03/21   Janeece Agee, NP  Fluticasone-Salmeterol (ADVAIR) 100-50 MCG/DOSE AEPB     [provider]  levalbuterol Pauline Aus HFA) 45 MCG/ACT inhaler Inhale 2 puffs into the lungs every 8 (eight) hours as needed for wheezing. 02/27/20   Just, Azalee Course, FNP  losartan (COZAAR) 100 MG tablet TAKE ONE TABLET BY MOUTH DAILY Patient taking differently: 50 mg. 10/07/20   Janeece Agee, NP  lubiprostone (AMITIZA) 8 MCG capsule TAKE ONE CAPSULE BY MOUTH TWICE A DAY WITH A MEAL 09/07/20   [provider]  meclizine (ANTIVERT) 25 MG tablet Take 1 tablet (25 mg total) by mouth 3 (three) times daily as needed for dizziness. 09/23/20   York Spaniel, MD  Menthol (BIOFREEZE) 5 % [facility] Apply 1 patch topically as needed.    [provider]  metoprolol succinate (TOPROL-XL) 25 MG 24 hr tablet TAKE ONE TABLET BY MOUTH DAILY Patient taking differently: 12.5 mg. 08/05/20   Janeece Agee, NP  Multiple Vitamin (MULTIVITAMIN PO) Take by mouth.    [provider]  naproxen (NAPROSYN) 500 MG tablet Take 1 tablet (500 mg total) by mouth every 12 (twelve) hours  as needed. 11/16/20   York Spaniel, MD  omeprazole (PRILOSEC) 40 MG capsule Take 1 capsule (40 mg total) by mouth daily. 01/03/20   Janeece Agee, NP  orphenadrine (NORFLEX) 100 MG tablet TAKE ONE TABLET BY MOUTH TWICE A DAY AS NEEDED FOR MUSCLE SPASMS 11/05/20   Janeece Agee, NP  pregabalin (LYRICA) 200 MG capsule Take 1 capsule (200 mg total) by mouth 3 (three) times daily. 01/14/21   Levert Feinstein, MD  rizatriptan (MAXALT) 10 MG tablet Take one tablet up to three times a day if needed 09/28/22   Glean Salvo, NP  rosuvastatin (CRESTOR) 20 MG tablet TAKE ONE TABLET BY MOUTH EVERY EVENING 10/20/20    Janeece Agee, NP  temazepam (RESTORIL) 15 MG capsule 30 mg.    [provider]  traZODone (DESYREL) 50 MG tablet Take 50 mg by mouth at bedtime. 09/19/20   [provider]  zolpidem (AMBIEN) 10 MG tablet Take 2 tablets by mouth at bedtime.  04/06/16   [provider]      Allergies    Amlodipine, Clonidine, Diclofenac, Hydrochlorothiazide, Sulfa antibiotics, Famciclovir, Metronidazole, Soma [carisoprodol], Acyclovir, Acyclovir and related, Benadryl [diphenhydramine hcl], Cephalosporins, Codeine, Cyclobenzaprine, Cymbalta [duloxetine hcl], Diclofenac sodium, Dicyclomine, Hydrocodone, Hydrocodone-acetaminophen, Linzess [linaclotide], Nitrofurantoin monohyd macro, Suboxone [buprenorphine hcl-naloxone hcl], Tizanidine, Tramadol, Baclofen, Drug ingredient [ganciclovir], Meloxicam, and Sulfamethoxazole    Review of Systems   Review of Systems  Constitutional:  Negative for chills and fever.  HENT:  Negative for ear pain and sore throat.   Eyes:  Negative for pain and visual disturbance.  Respiratory:  Negative for cough and shortness of breath.   Cardiovascular:  Positive for chest pain. Negative for palpitations.  Gastrointestinal:  Negative for abdominal pain and vomiting.  Genitourinary:  Negative for dysuria and hematuria.  Musculoskeletal:  Negative for arthralgias and back pain.  Skin:  Negative for color change and rash.  Neurological:  Negative for seizures and syncope.  All other systems reviewed and are negative.   Physical Exam Updated Vital Signs BP (!) 166/77   Pulse 85   Temp 98 F (36.7 C)   Resp 18   Wt 78.5 kg   SpO2 96%   BMI 30.65 kg/m  Physical Exam Chest:     Chest wall: Tenderness present.    Skin:    Findings: Ecchymosis, lesion and rash present.     ED Results / Procedures / Treatments   Labs (all labs ordered are listed, but only abnormal results are displayed) Labs Reviewed - No data to  display  EKG None  Radiology DG Ribs Unilateral W/Chest Left  Result Date: 01/20/2023 CLINICAL DATA:  Fall onto left side EXAM: LEFT RIBS AND CHEST - 3 VIEW COMPARISON:  None Available. FINDINGS: No fracture or other bone lesions are seen involving the ribs. There is no evidence of pneumothorax or pleural effusion. Left basilar patchy opacities. Heart size and mediastinal contours are within normal limits. IMPRESSION: 1. No displaced rib fractures. 2. Left basilar patchy opacities, likely atelectasis. Electronically Signed   By: Agustin Cree M.D.   On: 01/20/2023 10:18    Procedures Procedures    Medications Ordered in ED Medications  lidocaine (LIDODERM) 5 % 1 patch (1 patch Transdermal Patch Applied 01/20/23 0949)  ketorolac (TORADOL) 15 MG/ML injection 15 mg (15 mg Intramuscular Given 01/20/23 1610)    ED Course/ Medical Decision Making/ A&P  Medical Decision Making Amount and/or Complexity of Data Reviewed Radiology: ordered.  Risk OTC drugs. Prescription drug management.   64:49 AM  67 year old female presenting from home after fall that occurred 2 days ago with residual left-sided rib pain.  Patient is alert oriented x 3, no acute distress, afebrile, stable vital signs.  Equal bilateral breath sounds no adventitious lung sounds.  No cardiac murmurs.  Tenderness palpation of ribs 7 and 8 on the left anterior lateral side.  No ecchymosis, hematomas, crepitus, or rashes.  Symptoms likely secondary to costochondritis versus rib fracture.  Dedicated left rib series demonstrate no fractures.  Patient offered CT however told that it would likely not change her management plan.  Patient declining CT at this time.  Sent home with incentive spirometry and medication for pain control.  No respiratory distress.  No hypoxia.  No pulmonary contusion or hemothorax on chest x-ray.  Patient in no distress and overall condition improved here in the ED. Detailed  discussions were had with the patient regarding current findings, and need for close f/u with PCP or on call doctor. The patient has been instructed to return immediately if the symptoms worsen in any way for re-evaluation. Patient verbalized understanding and is in agreement with current care plan. All questions answered prior to discharge.         Final Clinical Impression(s) / ED Diagnoses Final diagnoses:  Costochondritis  Fall, initial encounter    Rx / DC Orders ED Discharge Orders          Ordered    oxyCODONE (ROXICODONE) 5 MG immediate release tablet  Every 8 hours PRN        01/20/23 1107    acetaminophen (TYLENOL) 325 MG tablet  Every 6 hours PRN        01/20/23 1107    lidocaine (LIDODERM) 5 %  Every 24 hours        01/20/23 1107              Franne Forts, DO 01/20/23 1107    Franne Forts, DO 01/20/23 1108

## 2023-01-20 NOTE — ED Triage Notes (Signed)
Pt reports knee gave out causing her to fall on left side. 2 days ago. Pain left side. Pain with breathing. Denies Sob

## 2023-01-20 NOTE — ED Notes (Signed)
Instructed patient with IS. Stated she was in a good amount of pain, but was about to do [phone]. Encouraged her to use it throughout the day.

## 2023-02-06 NOTE — Progress Notes (Unsigned)
Jasmine [name]: Jasmine Jasmine [name] Jasmine [name] DOB: [birth year]  REASON FOR VISIT: Follow up for migraines HISTORY FROM: Jasmine [name], husband PRIMARY NEUROLOGIST: Dr. Lucia Gaskins   ASSESSMENT AND PLAN 67 y.o. year old female   1.  Migraine headache 2.  Fibromyalgia 3.  Chronic low back pain 4.  Gait disorder 5.  Vertigo 6.  Generalized weakness  -Here with multiple complaints today including: Migraine, vertigo, general weakness, visual blurring towards Jasmine Jasmine [name] end of Jasmine Jasmine [name] day, cognitive clouding, slow processing speed -She is already seeing ophthalmology, ENT, pain clinic -Stop Aimovig, try Nurtec for migraine preventative, developed needle fear  -Continue Maxalt as needed for acute headache, discussed potential for rebound headache, not to treat headache more than 2 to 3 days a week -Check MRI of Jasmine Jasmine [name] brain rule out stroke, other acute abnormality, she does have cochlear implant that is reportedly MRI compatible, on exam she has some left-sided weakness to her arm and leg, but I am not convinced it is organic  -She has 28 medication allergies/interaction -Dr. Anne Hahn has assigned Dr. Lucia Gaskins as primary, I will see what MRI shows, right now I scheduled 4 months with Dr. Lucia Gaskins but we can adjust this  HISTORY OF PRESENT ILLNESS: Today 02/06/23   Dr. Lucia Gaskins Update 02/02/2022: Here for review of MRi brain which was unremarkable. She has a Persistent left mastoid effusion. Otherwise, no abnormality identified. I explained this to husband and Jasmine [name]., reviewed anatomy, causes, answered all questions She can follow up with ENT for this. Otherwise she is doing well on her migraine management, will refill meds.   Update 09/29/21 SS: Here today with her husband. Seen for sooner appointment. Claims referred from cardiologist who she was seeing for CP for vertigo. Vertigo started 18 weeks ago. Feels Jasmine Jasmine [name] room is spinning when she bends over and gets weak. Had cardiac stress test was normal reportedly. Felt CP was related to BP flu cations.  Remains on Aimovig 140 mg monthly injection. Last injection was in June, prior to that missed several months. Her husband has fear of injecting his wife. Has had few migraines, not sure how many. Takes Maxalt, uses all 10 tablets monthly. It does help. Headache is to Jasmine Jasmine [name] top right vertex. Feels vision toward end of Jasmine Jasmine [name] day is blurry, has seen [facility]. Jasmine [name] times has bad headache, sensitive to light, sound, nausea. Concerned with functioning of Jasmine Jasmine [name] brain, hard time concentrating, focusing. Feels like she is weak. Feels these symptoms over Jasmine Jasmine [name] last 18 weeks. No longer on oxycodone. Towards Jasmine Jasmine [name] end of Jasmine Jasmine [name] day her speech is slurred. PCP gives Lyrica for fibro. Is Jasmine [name] concerned with Jasmine Jasmine [name] functioning of her brain, something not right neurologically. Has left cochlear implant. No falls. 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 Her speech is slurred at times. She brought 3 cushions to sit on.  She and her husband are poor historians. She cancelled this appointment, then arrived 2 minutes before appointment time to be seen.   Update 05/19/21 SS: Jasmine Jasmine [name] Jasmine [name] here today for follow-up. Has about 1-2 migraines a week, on Aimovig 140 mg monthly, tolerates well. Takes maxalt for acute headache, works well. Takes Jasmine Jasmine [name] Lyrica up to 200 mg 3 times daily for fibro pain, it helps. Goes to [facility] when injection of low back is needed. Vertigo flares intermittently. Takes meclizine with good benefit. Brought her own cushions for Jasmine Jasmine [name] chair. Sees [facility], but claims not taking any "strong" medications. PCP will prescribe Lyrica for her. On prednisone for sinus infection currently. Has  left cochlear implant.  HISTORY  11/16/2020 Dr. Anne Hahn: Jasmine Jasmine [name] Jasmine [name] is a 67 year old left-handed white female with a history of migraine headaches.  Jasmine Jasmine [name] Jasmine [name] is on Aimovig with some benefit.  She is having 1 or 2 headaches a week, about twice a month Jasmine Jasmine [name] headaches may be more incapacitating.  Jasmine Jasmine [name] Jasmine [name] is tolerating Aimovig  well.  She has Maxalt, but she has run out of Jasmine Jasmine [name] medication.  This was helpful for her headache.  Jasmine Jasmine [name] Jasmine [name] has fibromyalgia, she was switched to Lyrica which seems to have worked much better for her fibromyalgia pain.  She is being followed through Dr. Shelle Iron from orthopedic surgery for back discomfort.  She has been referred to Dr. Ethelene Hal after MRI evaluation of Jasmine Jasmine [name] back.  She apparently had an exacerbation of back pain after she lifted a 25 pound bag of mulch on 30 September 2020.  REVIEW OF SYSTEMS: Out of a complete 14 system review of symptoms, Jasmine Jasmine [name] Jasmine [name] complains only of Jasmine Jasmine [name] following symptoms, and all other reviewed systems are negative.  See HPI  ALLERGIES: Allergies  Allergen Reactions   Amlodipine Shortness Of Breath    Pt states she also had very bad pain    Clonidine Palpitations   Diclofenac Nausea And Vomiting and Nausea Only   Hydrochlorothiazide Shortness Of Breath   Sulfa Antibiotics Hives and Rash   Famciclovir Other (See Comments)   Metronidazole Nausea And Vomiting   Soma [Carisoprodol] Other (See Comments)    Moody and sleepy Cognitive Function, Daytime sleepiness   Acyclovir    Acyclovir And Related    Benadryl [Diphenhydramine Hcl]     Severe emotional reaction and doesn't work well with Pt. Past history   Cephalosporins Hives   Codeine Other (See Comments)    Makes Jasmine [name] feel odd ; "sometimes mild; sometimes severe reaction; mostly severe"   Cyclobenzaprine Other (See Comments)    Muscle Aches.   Cymbalta [Duloxetine Hcl]     Stomach upset   Diclofenac Sodium Nausea And Vomiting   Dicyclomine    Hydrocodone    Hydrocodone-Acetaminophen    Linzess [Linaclotide] Diarrhea   Nitrofurantoin Monohyd Macro     Felt funny   Suboxone [Buprenorphine Hcl-Naloxone Hcl] Nausea And Vomiting   Tizanidine     Other reaction(s): Other (See Comments) insomnia   Tramadol    Baclofen Nausea Only   Drug Ingredient [Ganciclovir] Rash   Meloxicam     Other  reaction(s): Confusion   Sulfamethoxazole Rash    HOME MEDICATIONS: Outpatient Medications Prior to Visit  Medication Sig Dispense Refill   acetaminophen (TYLENOL) 325 MG tablet Take 2 tablets (650 mg total) by mouth every 6 (six) hours as needed for mild pain (pain score 1-3). 30 tablet 0   AIMOVIG 140 MG/ML SOAJ INJECT 140MG  UNDER Jasmine Jasmine [name] SKIN EVERY 30 DAYS 1 mL 11   azelastine (ASTELIN) 0.1 % nasal spray Place 1 spray into both nostrils 2 (two) times daily. Use in each nostril as directed 30 mL 6   azelastine (ASTELIN) 0.1 % nasal spray Place into Jasmine Jasmine [name] nose.     B COMPLEX VITAMINS PO Take by mouth.     diclofenac Sodium (VOLTAREN) 1 % GEL Apply 2 g topically 4 (four) times daily. 100 g 1   felodipine (PLENDIL) 10 MG 24 hr tablet Take 1 tablet (10 mg total) by mouth daily. 90 tablet 3   fluticasone (FLONASE) 50 MCG/ACT nasal spray SPRAY TWO SPRAYS IN EACH NOSTRIL ONCE DAILY 16 mL 0   Fluticasone-Salmeterol (  ADVAIR) 100-50 MCG/DOSE AEPB      levalbuterol (XOPENEX HFA) 45 MCG/ACT inhaler Inhale 2 puffs into Jasmine Jasmine [name] lungs every 8 (eight) hours as needed for wheezing. 1 each 3   lidocaine (LIDODERM) 5 % Place 1 patch onto Jasmine Jasmine [name] skin daily. Remove & Discard patch within 12 hours or as directed by MD 30 patch 0   losartan (COZAAR) 100 MG tablet TAKE ONE TABLET BY MOUTH DAILY (Jasmine [name] taking differently: 50 mg.) 30 tablet 3   lubiprostone (AMITIZA) 8 MCG capsule TAKE ONE CAPSULE BY MOUTH TWICE A DAY WITH A MEAL     meclizine (ANTIVERT) 25 MG tablet Take 1 tablet (25 mg total) by mouth 3 (three) times daily as needed for dizziness. 30 tablet 3   Menthol (BIOFREEZE) 5 % PTCH Apply 1 patch topically as needed.     metoprolol succinate (TOPROL-XL) 25 MG 24 hr tablet TAKE ONE TABLET BY MOUTH DAILY (Jasmine [name] taking differently: 12.5 mg.) 90 tablet 1   Multiple Vitamin (MULTIVITAMIN PO) Take by mouth.     naproxen (NAPROSYN) 500 MG tablet Take 1 tablet (500 mg total) by mouth every 12 (twelve) hours as needed. 60  tablet 2   omeprazole (PRILOSEC) 40 MG capsule Take 1 capsule (40 mg total) by mouth daily. 90 capsule 3   orphenadrine (NORFLEX) 100 MG tablet TAKE ONE TABLET BY MOUTH TWICE A DAY AS NEEDED FOR MUSCLE SPASMS 60 tablet 0   pregabalin (LYRICA) 200 MG capsule Take 1 capsule (200 mg total) by mouth 3 (three) times daily. 90 capsule 5   rizatriptan (MAXALT) 10 MG tablet Take one tablet up to three times a day if needed 10 tablet 11   rosuvastatin (CRESTOR) 20 MG tablet TAKE ONE TABLET BY MOUTH EVERY EVENING 30 tablet 1   temazepam (RESTORIL) 15 MG capsule 30 mg.     traZODone (DESYREL) 50 MG tablet Take 50 mg by mouth at bedtime.     zolpidem (AMBIEN) 10 MG tablet Take 2 tablets by mouth at bedtime.      No facility-administered medications prior to visit.    PAST MEDICAL HISTORY: Past Medical History:  Diagnosis Date   Achilles tendinosis of left lower extremity 01/23/2017   Anxiety    Asthma    Benign paroxysmal positional vertigo 01/05/2016   Bilateral sensorineural hearing loss 12/22/2014   Blepharitis of upper and lower eyelids of both eyes 04/27/2016   Bradycardia 09/22/2014   Cervicogenic headache 08/24/2016   Chronic high back pain    CKD (chronic kidney disease) 12/08/2014   Cochlear implant in place    Colon polyp    Common migraine with intractable migraine 02/19/2020   Constipation    Diverticulitis    Diverticulosis    DJD (degenerative joint disease)    Double vision    Dry eye syndrome of both lacrimal glands 04/27/2016   Fibromyalgia    Heart murmur    High cholesterol    Hypercalcemia    Hypercholesterolemia    Hyperlipidemia 02/01/2016   Hypertension    Hypoglycemia    Insomnia 09/22/2010   Kidney stone    Meniscus tear    Right   Migraine 08/07/2015   Mild intermittent asthma without complication 09/22/2010   Nephrolithiasis 03/17/2014   Nocturnal leg cramps 05/31/2019   Osteoporosis    Primary hyperparathyroidism (HCC) 12/10/2014   PTSD (post-traumatic  stress disorder)    Renal cyst 05/12/2013   Schizophrenia (HCC)    treated by Dr. Donell Beers   Schizophrenia [facility])  Seizures (HCC)    last seizure 3 years ago, not on AED   Stroke (HCC) 07/08/11   "I've had mini strokes before; left side of face  is more down than right "   Syncope and collapse    One episode - 07/10/15   TIA (transient ischemic attack)     PAST SURGICAL HISTORY: Past Surgical History:  Procedure Laterality Date   CATARACT EXTRACTION     COCHLEAR IMPLANT  2010   left   DILATION AND CURETTAGE OF UTERUS     HERNIA REPAIR     INGUINAL HERNIA REPAIR  1985   left   KIDNEY STONE SURGERY  ~ 2006   PARATHYROIDECTOMY     skin cancer removal     TONSILLECTOMY     "as a child"   tooth removal     WISDOM TOOTH EXTRACTION      FAMILY HISTORY: Family History  Problem Relation Age of Onset   Leukemia Mother 33       Deceased   Heart attack Father 14       Living   Arthritis Father    Skin cancer Father    Hypertension Father    Hyperlipidemia Father    Fibromyalgia Sister    Neuropathy Sister    Headache Sister    HIV Sister    Fibromyalgia Sister    Neuropathy Sister    Headache Sister    Arthritis/Rheumatoid Sister    Neuropathy Sister    Thyroid disease Sister    Neuropathy Sister    Neuropathy Brother        Peripheal   Alzheimer's disease Paternal Aunt        X2   Stomach cancer Paternal Aunt        x1   Arthritis/Rheumatoid Paternal Aunt        x1   Diabetes Paternal Grandmother     SOCIAL HISTORY: Social History   Socioeconomic History   Marital status: Married    Spouse name: Alinda Money   Number of children: 3   Years of education: 2 yrs coll   Highest education level: Not on file  Occupational History   Occupation: Disabled  Tobacco Use   Smoking status: Former    Current packs/day: 0.00    Average packs/day: 1 pack/day for 3.0 years (3.0 ttl pk-yrs)    Types: Cigarettes    Start date: 02/28/2001    Quit date: 02/29/2004    Years since  quitting: 18.9   Smokeless tobacco: Never  Vaping Use   Vaping status: Never Used  Substance and Sexual Activity   Alcohol use: Not Currently    Comment: "glass of wine q once in awhile; not very often; do it on special occasion"   Drug use: No   Sexual activity: Never  Other Topics Concern   Not on file  Social History Narrative   Lives at home with her husband.   Caffeine 6-7 cups caffeine daily   Left-handed.   Social Determinants of Health   Financial Resource Strain: Low Risk  (11/16/2022)   Received from [facility]   Overall Financial Resource Strain (CARDIA)    Difficulty of Paying Living Expenses: Not very hard  Food Insecurity: Food Insecurity Present (11/16/2022)   Received from [facility]   Hunger Vital Sign    Worried About Running Out of Food in Jasmine Jasmine [name] Last Year: Sometimes true    Ran Out of Food in Jasmine Jasmine [name] Last Year: Sometimes true  Transportation  Needs: No Transportation Needs (11/16/2022)   Received from [facility] - Transportation    Lack of Transportation (Medical): No    Lack of Transportation (Non-Medical): No  Physical Activity: Sufficiently Active (11/16/2022)   Received from [facility]   Exercise Vital Sign    Days of Exercise per Week: 3 days    Minutes of Exercise per Session: 120 min  Stress: Stress Concern Present (11/16/2022)   Received from [facility] - Occupational Stress Questionnaire    Feeling of Stress : Rather much  Social Connections: Socially Integrated (11/16/2022)   Received from [facility]   Social Network    How would you rate your social network (family, work, friends)?: Good participation with social networks  Intimate Partner Violence: Not At Risk (11/16/2022)   Received from [facility]   HITS    Over Jasmine Jasmine [name] last 12 months how often did your partner physically hurt you?: Never    Over Jasmine Jasmine [name] last 12 months how often did your partner insult you or talk down to you?: Never     Over Jasmine Jasmine [name] last 12 months how often did your partner threaten you with physical harm?: Never    Over Jasmine Jasmine [name] last 12 months how often did your partner scream or curse at you?: Sometimes    PHYSICAL EXAM  There were no vitals filed for this visit.  There is no height or weight on file to calculate BMI.  Generalized: Well developed, in no acute distress  Neurological examination  Mentation: Alert oriented to time, place, is a poor historian, thoughts are scattered, follows commands, speech is intermittently slurred Cranial nerve II-XII: Pupils were equal round reactive to light. Extraocular movements were full but with looking to Jasmine Jasmine [name] left medially, wants her eyes want to close, visual field were full on confrontational test. Facial sensation and strength were normal. Head turning and shoulder shrug  were normal and symmetric. Motor: left sided weakness arm and leg that is giveaway  Sensory: Sensory testing is intact to soft touch on all 4 extremities. No evidence of extinction is noted.  Coordination: Cerebellar testing reveals good finger-nose-finger and heel-to-shin bilaterally.  Gait and station: Gait is slightly wide-based, is independent and steady Reflexes: Deep tendon reflexes are symmetric but slightly increased  DIAGNOSTIC DATA (LABS, IMAGING, TESTING) - I reviewed Jasmine [name] records, labs, notes, testing and imaging myself where available.  Lab Results  Component Value Date   WBC 9.9 09/16/2020   HGB 13.3 09/16/2020   HCT 40.0 09/16/2020   MCV 86.7 09/16/2020   PLT 234.0 09/16/2020      Component Value Date/Time   NA 141 09/16/2020 1353   NA 145 (H) 03/25/2020 1204   K 3.8 09/16/2020 1353   CL 104 09/16/2020 1353   CO2 26 09/16/2020 1353   GLUCOSE 93 09/16/2020 1353   BUN 23 09/16/2020 1353   BUN 15 03/25/2020 1204   CREATININE 0.67 09/16/2020 1353   CALCIUM 9.6 09/16/2020 1353   PROT 6.5 09/16/2020 1353   PROT 6.7 03/25/2020 1204   ALBUMIN 4.4 09/16/2020 1353    ALBUMIN 4.4 03/25/2020 1204   AST 23 09/16/2020 1353   ALT 24 09/16/2020 1353   ALKPHOS 72 09/16/2020 1353   BILITOT 0.5 09/16/2020 1353   BILITOT 0.4 03/25/2020 1204   GFRNONAA >60 05/27/2020 1829   GFRAA 108 03/25/2020 1204   Lab Results  Component Value Date   CHOL 180 10/04/2019   HDL 63  10/04/2019   LDLCALC 106 (H) 10/04/2019   TRIG 57 10/04/2019   CHOLHDL 2.9 10/04/2019   Lab Results  Component Value Date   HGBA1C 6.1 09/16/2020   Lab Results  Component Value Date   VITAMINB12 1,246 (H) 10/04/2019   Lab Results  Component Value Date   TSH 4.460 10/04/2019   Otila Kluver, DNP  [facility] [address] [place], [place] [address] [phone]

## 2023-02-07 ENCOUNTER — Telehealth: Payer: Self-pay | Admitting: Neurology

## 2023-02-07 ENCOUNTER — Ambulatory Visit: Payer: 59 | Admitting: Neurology

## 2023-02-07 NOTE — Telephone Encounter (Signed)
Pt called to r/s appointment with wait list

## 2023-02-07 NOTE — Telephone Encounter (Signed)
Due to having to take a family member to the hospital pt had to cancel today's appointment

## 2023-02-08 ENCOUNTER — Ambulatory Visit: Payer: 59 | Admitting: Neurology

## 2023-02-25 ENCOUNTER — Encounter (HOSPITAL_BASED_OUTPATIENT_CLINIC_OR_DEPARTMENT_OTHER): Payer: Self-pay

## 2023-02-25 ENCOUNTER — Emergency Department (HOSPITAL_BASED_OUTPATIENT_CLINIC_OR_DEPARTMENT_OTHER)
Admission: EM | Admit: 2023-02-25 | Discharge: 2023-02-25 | Disposition: A | Discharge status: Home / Self Care | Payer: 59 | Attending: Emergency Medicine | Admitting: Emergency Medicine

## 2023-02-25 ENCOUNTER — Other Ambulatory Visit: Payer: Self-pay

## 2023-02-25 ENCOUNTER — Emergency Department (HOSPITAL_BASED_OUTPATIENT_CLINIC_OR_DEPARTMENT_OTHER): Payer: 59

## 2023-02-25 DIAGNOSIS — M549 Dorsalgia, unspecified: Secondary | ICD-10-CM | POA: Diagnosis present

## 2023-02-25 DIAGNOSIS — M546 Pain in thoracic spine: Secondary | ICD-10-CM | POA: Insufficient documentation

## 2023-02-25 DIAGNOSIS — S2232XA Fracture of one rib, left side, initial encounter for closed fracture: Secondary | ICD-10-CM | POA: Diagnosis not present

## 2023-02-25 DIAGNOSIS — W19XXXA Unspecified fall, initial encounter: Secondary | ICD-10-CM | POA: Diagnosis not present

## 2023-02-25 DIAGNOSIS — G8929 Other chronic pain: Secondary | ICD-10-CM | POA: Insufficient documentation

## 2023-02-25 DIAGNOSIS — I10 Essential (primary) hypertension: Secondary | ICD-10-CM | POA: Insufficient documentation

## 2023-02-25 DIAGNOSIS — S32020A Wedge compression fracture of second lumbar vertebra, initial encounter for closed fracture: Secondary | ICD-10-CM | POA: Diagnosis not present

## 2023-02-25 DIAGNOSIS — Z79899 Other long term (current) drug therapy: Secondary | ICD-10-CM | POA: Diagnosis not present

## 2023-02-25 LAB — BASIC METABOLIC PANEL
Anion gap: 9 (ref 5–15)
BUN: 22 mg/dL (ref 8–23)
CO2: 23 mmol/L (ref 22–32)
Calcium: 9.2 mg/dL (ref 8.9–10.3)
Chloride: 104 mmol/L (ref 98–111)
Creatinine, Ser: 0.72 mg/dL (ref 0.44–1.00)
GFR, Estimated: >60 mL/min (ref >60)
Glucose, Bld: 102 mg/dL — ABNORMAL HIGH (ref 70–99)
Potassium: 4 mmol/L (ref 3.5–5.1)
Sodium: 136 mmol/L (ref 135–145)

## 2023-02-25 LAB — CBC
HCT: 41.8 % (ref 36.0–46.0)
Hemoglobin: 13.6 g/dL (ref 12.0–15.0)
MCH: 28.6 pg (ref 26.0–34.0)
MCHC: 32.5 g/dL (ref 30.0–36.0)
MCV: 87.8 fL (ref 80.0–100.0)
Platelets: 255 10*3/uL (ref 150–400)
RBC: 4.76 MIL/uL (ref 3.87–5.11)
RDW: 15.3 % (ref 11.5–15.5)
WBC: 10.1 10*3/uL (ref 4.0–10.5)
nRBC: 0 % (ref 0.0–0.2)

## 2023-02-25 MED ORDER — IOHEXOL 300 MG/ML  SOLN
75.0000 mL | Freq: Once | INTRAMUSCULAR | Status: AC | PRN
  Administered 2023-02-25: 75 mL via INTRAVENOUS

## 2023-02-25 MED ORDER — OXYCODONE-ACETAMINOPHEN 5-325 MG PO TABS
1.0000 | ORAL_TABLET | Freq: Once | ORAL | Status: AC
  Administered 2023-02-25: 1 via ORAL
  Filled 2023-02-25: qty 1

## 2023-02-25 MED ORDER — KETOROLAC TROMETHAMINE 15 MG/ML IJ SOLN
15.0000 mg | Freq: Once | INTRAMUSCULAR | Status: AC
  Administered 2023-02-25: 15 mg via INTRAMUSCULAR
  Filled 2023-02-25: qty 1

## 2023-02-25 MED ORDER — OXYCODONE HCL 5 MG PO TABS: 5.0000 mg | ORAL_TABLET | Freq: Four times a day (QID) | ORAL | 0 refills | Status: AC | PRN

## 2023-02-25 MED ORDER — LIDOCAINE 5 % EX PTCH: 1.0000 | MEDICATED_PATCH | CUTANEOUS | 0 refills | Status: DC

## 2023-02-25 NOTE — ED Provider Notes (Cosign Needed)
Patient with fall about 1 month ago, slow worsening of pain. Pt able to ambulate well.   Pending CT scans  Physical Exam  BP (!) 120/58   Pulse 63   Temp 98.2 F (36.8 C)   Resp 16   Ht 5\' 3"  (1.6 m)   Wt 78 kg   SpO2 97%   BMI 30.46 kg/m   Physical Exam Vitals and nursing note reviewed.  Constitutional:      Appearance: Normal appearance.     Comments: Moves around in bed without difficulty  HENT:     Head: Atraumatic.  Pulmonary:     Effort: Pulmonary effort is normal.  Neurological:     General: No focal deficit present.     Mental Status: She is alert.  Psychiatric:        Mood and Affect: Mood normal.        Behavior: Behavior normal.     Procedures  Procedures  ED Course / MDM    Medical Decision Making Amount and/or Complexity of Data Reviewed Labs: ordered. Radiology: ordered.  Risk Prescription drug management.   I reviewed CT images with the patient including her subacute fracture of the left anterior sixth rib, and age-indeterminate compression fractures of L2 and L6.  These are likely not new as her fall was over a month ago and she reports previous knowledge of compression fractures.  She is able to move around comfortably in bed, but reports management of pain at home with Advil, Tylenol, and Lyrica is not helping much.  Will prescribe short course of oxycodone for breakthrough pain for her fractures.  I discussed that she needs to follow-up with the spine office for further management of her compression fractures if she is having ongoing pain.  She was seeing a spine specialist, but states they are out on maternity leave and will not be back for a while.  She requests information for another office.  I have provided this in her discharge paperwork.  Patient stable and appropriate for discharge home this time.  Strict return precautions given.       Arabella Merles, PA-C 02/25/23 2056

## 2023-02-25 NOTE — ED Triage Notes (Signed)
The patient is having increased back pain. No new injury.

## 2023-02-25 NOTE — Discharge Instructions (Addendum)
You were seen in the emergency room for back pain.    Your CT scan did not show any acute abnormalities of the chest, abdomen, or pelvis.  The CT scan did show a subacute nondisplaced fracture of the left anterior sixth rib.  It also showed compression fractures of L2 and L6.  Please follow-up with the spine office listed below for further management of your compression fractures.   At home, you may try Voltaren gel or lidocaine patches to help with pain.  I have prescribed lidocaine patches for you to use at home.  You may use up to 800mg  ibuprofen every 8 hours as needed for pain.  Do not exceed 2.4g of ibuprofen per day.  You may take up to 1000mg  of tylenol every 6 hours as needed for pain.  Do not take more then 4g per day.  You have been prescribed Oxycodone-this is a narcotic/controlled substance medication that has potential addicting qualities.  You may take 1 tablet every 6 hours as needed for severe pain.  Do not drive or operate heavy machinery when taking this medicine as it can be sedating. Do not drink alcohol or take other sedating medications when taking this medicine for safety reasons.  Keep this out of reach of small children.     Return to the ER for any other new or concerning symptoms.

## 2023-02-25 NOTE — ED Provider Notes (Signed)
[place] [facility] AT [facility] Provider Note   CSN: [id number] Arrival date & time: 02/25/23  1223     History  Chief Complaint  Patient presents with   Back Pain    Jasmine [name] is a 67 y.o. female patient with past medical history of schizophrenia, seizures, hyperlipidemia hypertension chronic back pain presenting to emergency room with complaint of back pain patient reports her back pain started 2 months ago after a fall and has gradually worsened.  Patient reports she fell primarily on the left side of her ribs.  Now localizing pain to mid thoracic spine radiating along left posterior lower ribs.  Patient reports pain with walking, pain with taking a deep breath and pain with lying down flat.  Has been trying Tylenol ibuprofen over-the-counter which has not helped symptoms.  Was reportedly sent here for CT imaging has already had normal x-rays.  Denies chest pain shortness of breath abdominal pain nausea vomiting diarrhea. No new falls or injury.   Back Pain      Home Medications Prior to Admission medications   Medication Sig Start Date End Date Taking? Authorizing Provider  acetaminophen (TYLENOL) 325 MG tablet Take 2 tablets (650 mg total) by mouth every 6 (six) hours as needed for mild pain (pain score 1-3). 01/20/23   Franne Forts, DO  AIMOVIG 140 MG/ML SOAJ INJECT 140MG  UNDER THE SKIN EVERY 30 DAYS 11/01/22   Glean Salvo, NP  azelastine (ASTELIN) 0.1 % nasal spray Place 1 spray into both nostrils 2 (two) times daily. Use in each nostril as directed 05/19/20   Janeece Agee, NP  azelastine (ASTELIN) 0.1 % nasal spray Place into the nose. 05/19/20   [provider]  B COMPLEX VITAMINS PO Take by mouth.    [provider]  diclofenac Sodium (VOLTAREN) 1 % GEL Apply 2 g topically 4 (four) times daily. 08/21/20   Janeece Agee, NP  felodipine (PLENDIL) 10 MG 24 hr tablet Take 1 tablet (10 mg total) by mouth daily. 01/03/20    Janeece Agee, NP  fluticasone Aleda Grana) 50 MCG/ACT nasal spray SPRAY TWO SPRAYS IN EACH NOSTRIL ONCE DAILY 06/03/21   Janeece Agee, NP  Fluticasone-Salmeterol (ADVAIR) 100-50 MCG/DOSE AEPB     [provider]  levalbuterol Pauline Aus HFA) 45 MCG/ACT inhaler Inhale 2 puffs into the lungs every 8 (eight) hours as needed for wheezing. 02/27/20   Just, Azalee Course, FNP  lidocaine (LIDODERM) 5 % Place 1 patch onto the skin daily. Remove & Discard patch within 12 hours or as directed by MD 01/20/23   Edwin Dada P, DO  losartan (COZAAR) 100 MG tablet TAKE ONE TABLET BY MOUTH DAILY Patient taking differently: 50 mg. 10/07/20   Janeece Agee, NP  lubiprostone (AMITIZA) 8 MCG capsule TAKE ONE CAPSULE BY MOUTH TWICE A DAY WITH A MEAL 09/07/20   [provider]  meclizine (ANTIVERT) 25 MG tablet Take 1 tablet (25 mg total) by mouth 3 (three) times daily as needed for dizziness. 09/23/20   York Spaniel, MD  Menthol (BIOFREEZE) 5 % [facility] Apply 1 patch topically as needed.    [provider]  metoprolol succinate (TOPROL-XL) 25 MG 24 hr tablet TAKE ONE TABLET BY MOUTH DAILY Patient taking differently: 12.5 mg. 08/05/20   Janeece Agee, NP  Multiple Vitamin (MULTIVITAMIN PO) Take by mouth.    [provider]  naproxen (NAPROSYN) 500 MG tablet Take 1 tablet (500 mg total) by mouth every 12 (twelve)  hours as needed. 11/16/20   York Spaniel, MD  omeprazole (PRILOSEC) 40 MG capsule Take 1 capsule (40 mg total) by mouth daily. 01/03/20   Janeece Agee, NP  orphenadrine (NORFLEX) 100 MG tablet TAKE ONE TABLET BY MOUTH TWICE A DAY AS NEEDED FOR MUSCLE SPASMS 11/05/20   Janeece Agee, NP  pregabalin (LYRICA) 200 MG capsule Take 1 capsule (200 mg total) by mouth 3 (three) times daily. 01/14/21   Levert Feinstein, MD  rizatriptan (MAXALT) 10 MG tablet Take one tablet up to three times a day if needed 09/28/22   Glean Salvo, NP  rosuvastatin (CRESTOR) 20 MG tablet TAKE ONE TABLET BY  MOUTH EVERY EVENING 10/20/20   Janeece Agee, NP  temazepam (RESTORIL) 15 MG capsule 30 mg.    [provider]  traZODone (DESYREL) 50 MG tablet Take 50 mg by mouth at bedtime. 09/19/20   [provider]  zolpidem (AMBIEN) 10 MG tablet Take 2 tablets by mouth at bedtime.  04/06/16   [provider]      Allergies    Amlodipine, Clonidine, Diclofenac, Hydrochlorothiazide, Sulfa antibiotics, Famciclovir, Metronidazole, Soma [carisoprodol], Acyclovir, Acyclovir and related, Benadryl [diphenhydramine hcl], Cephalosporins, Codeine, Cyclobenzaprine, Cymbalta [duloxetine hcl], Diclofenac sodium, Dicyclomine, Hydrocodone, Hydrocodone-acetaminophen, Linzess [linaclotide], Nitrofurantoin monohyd macro, Suboxone [buprenorphine hcl-naloxone hcl], Tizanidine, Tramadol, Baclofen, Drug ingredient [ganciclovir], Meloxicam, and Sulfamethoxazole    Review of Systems   Review of Systems  Musculoskeletal:  Positive for back pain.    Physical Exam Updated Vital Signs BP (!) 154/80   Pulse 81   Temp 98.4 F (36.9 C) (Oral)   Resp 18   Ht 5\' 3"  (1.6 m)   Wt 78 kg   SpO2 96%   BMI 30.46 kg/m  Physical Exam Vitals and nursing note reviewed.  Constitutional:      General: She is not in acute distress.    Appearance: She is not toxic-appearing.  HENT:     Head: Normocephalic and atraumatic.  Eyes:     General: No scleral icterus.    Conjunctiva/sclera: Conjunctivae normal.  Cardiovascular:     Rate and Rhythm: Normal rate and regular rhythm.     Pulses: Normal pulses.     Heart sounds: Normal heart sounds.  Pulmonary:     Effort: Pulmonary effort is normal. No respiratory distress.     Breath sounds: Normal breath sounds.  Abdominal:     General: Abdomen is flat. Bowel sounds are normal.     Palpations: Abdomen is soft.     Tenderness: There is no abdominal tenderness.  Musculoskeletal:     Right lower leg: No edema.     Left lower leg: No edema.     Comments:  Tenderness to palpation over lower thoracic spine, posterior lateral left ribs.  No deformity.  No tenderness to anterior ribs  Skin:    General: Skin is warm and dry.     Findings: No lesion.  Neurological:     General: No focal deficit present.     Mental Status: She is alert and oriented to person, place, and time. Mental status is at baseline.     ED Results / Procedures / Treatments   Labs (all labs ordered are listed, but only abnormal results are displayed) Labs Reviewed  BASIC METABOLIC PANEL - Abnormal; Notable for the following components:      Result Value   Glucose, Bld 102 (*)    All other components within normal limits  CBC    EKG  None  Radiology No results found.  Procedures Procedures    Medications Ordered in ED Medications  ketorolac (TORADOL) 15 MG/ML injection 15 mg (15 mg Intramuscular Given 02/25/23 1557)  oxyCODONE-acetaminophen (PERCOCET/ROXICET) 5-325 MG per tablet 1 tablet (1 tablet Oral Given 02/25/23 1556)    ED Course/ Medical Decision Making/ A&P                                 Medical Decision Making Amount and/or Complexity of Data Reviewed Labs: ordered. Radiology: ordered.  Risk Prescription drug management.   [name] 67 y.o. presented today for back pain. Working Ddx: MSK in nature, fracture, epidural hematoma/abscess, cauda equina syndrome, spinal stenosis, spinal malignancy, discitis, spinal infection, spondylitises/ spondylosis, conus medullaris, DDD of the back.  R/o DDx: Cauda equina syndrome and additional dx are less likely than current impression due to history of present illness, physical exam, labs/imaging findings. No focal neurological deficits, no loss of bowel or bladder control.  Denies fever, night sweats, weight loss, h/o cancer, IVDU.  PMHX: schizophrenia, seizures, hyperlipidemia hypertension chronic back pain    Review of prior external notes: Reviewed visit 10/21/2022, 01/20/2023  Labs:  Labs  obtained for imaging purposes.  CBC, BMP  Imaging: CT chest abdomen pelvis thoracic and lumbar - PENDING   Problem List / ED Course / Critical interventions / Medication management  Reporting to emergency room with thoracic back pain radiating to left side of ribs.  Patient has extreme tenderness to palpation over posterior left ribs as well as thoracic spine.  Patient has not had any recent trauma injury or falls however did have an incident 2 months ago.  It is unclear whether this is acute worsening of her chronic back pain versus if this is related to fall 2 months ago at which time she was having documented anterior rib pain.  Patient is ambulatory well-appearing has stable vitals.  Obtaining imaging to rule out traumatic injury. Patient became quite irritated with staff/ provider about not having this ordered earlier, expressed several times that she feels her symptoms are getting worse and that we are dismissing her.  I ordered medication including Toradol, Norco  Reevaluation of the patient after these medicines showed that the patient improved Patients vitals assessed. Upon arrival patient is hemodynamically stable.  I have reviewed the patients home medicines and have made adjustments as needed  Consult: None    Plan:  Sign off to [facility], dispo pending imaging.  I anticipate patient will be stable for d/c and follow up with ortho OP. Will send with patient management.          Final Clinical Impression(s) / ED Diagnoses Final diagnoses:  Chronic left-sided thoracic back pain    Rx / DC Orders ED Discharge Orders     None         Smitty Knudsen, PA-C 02/25/23 2035

## 2023-03-16 ENCOUNTER — Emergency Department (HOSPITAL_BASED_OUTPATIENT_CLINIC_OR_DEPARTMENT_OTHER): Payer: 59

## 2023-03-16 ENCOUNTER — Emergency Department (HOSPITAL_BASED_OUTPATIENT_CLINIC_OR_DEPARTMENT_OTHER)
Admission: EM | Admit: 2023-03-16 | Discharge: 2023-03-16 | Disposition: A | Discharge status: Home / Self Care | Payer: 59

## 2023-03-16 ENCOUNTER — Other Ambulatory Visit: Payer: Self-pay

## 2023-03-16 DIAGNOSIS — W182XXA Fall in (into) shower or empty bathtub, initial encounter: Secondary | ICD-10-CM | POA: Diagnosis not present

## 2023-03-16 DIAGNOSIS — S29001A Unspecified injury of muscle and tendon of front wall of thorax, initial encounter: Secondary | ICD-10-CM | POA: Diagnosis present

## 2023-03-16 DIAGNOSIS — S2232XA Fracture of one rib, left side, initial encounter for closed fracture: Secondary | ICD-10-CM | POA: Diagnosis not present

## 2023-03-16 MED ORDER — LIDOCAINE 5 % EX PTCH
1.0000 | MEDICATED_PATCH | CUTANEOUS | Status: DC
  Administered 2023-03-16: 1 via TRANSDERMAL
  Filled 2023-03-16: qty 1

## 2023-03-16 MED ORDER — NAPROXEN 500 MG PO TABS: 500.0000 mg | ORAL_TABLET | Freq: Two times a day (BID) | ORAL | 0 refills | Status: AC | PRN

## 2023-03-16 MED ORDER — OXYCODONE HCL 5 MG PO TABS: 5.0000 mg | ORAL_TABLET | Freq: Four times a day (QID) | ORAL | 0 refills | Status: AC | PRN

## 2023-03-16 MED ORDER — LIDOCAINE 5 % EX PTCH: 1.0000 | MEDICATED_PATCH | CUTANEOUS | 0 refills | Status: AC

## 2023-03-16 MED ORDER — LIDOCAINE 5 % EX PTCH: 1.0000 | MEDICATED_PATCH | CUTANEOUS | 0 refills | Status: DC

## 2023-03-16 NOTE — Discharge Instructions (Signed)
Please read and follow all provided instructions.  Your diagnoses today include:  1. Closed fracture of one rib of left side, initial encounter     Tests performed today include: Rib and chest x-ray: Shows likely new seventh rib fracture, not seen on previous imaging Vital signs. See below for your results today.   Medications prescribed:  Naproxen - anti-inflammatory pain medication Do not exceed 500mg  naproxen every 12 hours, take with food  You have been prescribed an anti-inflammatory medication or NSAID. Take with food. Take smallest effective dose for the shortest duration needed for your pain. Stop taking if you experience stomach pain or vomiting.   Oxycodone - narcotic pain medication  DO NOT drive or perform any activities that require you to be awake and alert because this medicine can make you drowsy.   This medication can lead to falls.  Please take the lowest dose needed under supervision.  Only take with severe pain.  Take any prescribed medications only as directed.  Home care instructions:  Follow any educational materials contained in this packet.  Please use naproxen and Lidoderm patches for pain.  Only use the stronger pain medication if you have severe pain.  BE VERY CAREFUL not to take multiple medicines containing Tylenol (also called acetaminophen). Doing so can lead to an overdose which can damage your liver and cause liver failure and possibly death.   Follow-up instructions: Please follow-up with your primary care provider in the next 7 days for further evaluation of your symptoms.   Return instructions:  Please return to the Emergency Department if you experience worsening symptoms.  Please return if you have any other emergent concerns.  Additional Information:  Your vital signs today were: BP 134/79   Pulse 82   Temp 97.8 F (36.6 C) (Oral)   Resp 18   Ht 5\' 3"  (1.6 m)   Wt 78 kg   SpO2 98%   BMI 30.46 kg/m  If your blood pressure (BP)  was elevated above 135/85 this visit, please have this repeated by your doctor within one month. --------------

## 2023-03-16 NOTE — ED Triage Notes (Signed)
Pt states she fell a few days ago in the bathtub. Pt states that she is having left sided rib pain. Pt is hard of hearing

## 2023-03-16 NOTE — ED Provider Notes (Signed)
[place] [facility] AT [facility] Provider Note   CSN: [id number] Arrival date & time: 03/16/23  1226     History  Chief Complaint  Patient presents with   Chest Pain    Jasmine [name] is a 68 y.o. female.  Patient presents to the ED for evaluation of left-sided rib pain.  Patient had a fall in December and sustained spinal compression fractures, T6 and L2 (chronic) compression fracture and was also found to have subacute 6th rib fracture on the left.  She had follow-up with PCP on 1/7 for this.  Patient states that a couple of days later, perhaps 4 days later, she fell again in her bathtub.  She landed on her left side.  She has had continued pain since that time.  No difficulty breathing.  She was given treatment for sinusitis at her PCP visit.  She denies new head or neck injury.  She denies new extremity injury.       Home Medications Prior to Admission medications   Medication Sig Start Date End Date Taking? Authorizing Provider  acetaminophen (TYLENOL) 325 MG tablet Take 2 tablets (650 mg total) by mouth every 6 (six) hours as needed for mild pain (pain score 1-3). 01/20/23   Franne Forts, DO  AIMOVIG 140 MG/ML SOAJ INJECT 140MG  UNDER THE SKIN EVERY 30 DAYS 11/01/22   Glean Salvo, NP  azelastine (ASTELIN) 0.1 % nasal spray Place 1 spray into both nostrils 2 (two) times daily. Use in each nostril as directed 05/19/20   Janeece Agee, NP  azelastine (ASTELIN) 0.1 % nasal spray Place into the nose. 05/19/20   [provider]  B COMPLEX VITAMINS PO Take by mouth.    [provider]  diclofenac Sodium (VOLTAREN) 1 % GEL Apply 2 g topically 4 (four) times daily. 08/21/20   Janeece Agee, NP  felodipine (PLENDIL) 10 MG 24 hr tablet Take 1 tablet (10 mg total) by mouth daily. 01/03/20   Janeece Agee, NP  fluticasone Aleda Grana) 50 MCG/ACT nasal spray SPRAY TWO SPRAYS IN EACH NOSTRIL ONCE DAILY 06/03/21   Janeece Agee, NP   Fluticasone-Salmeterol (ADVAIR) 100-50 MCG/DOSE AEPB     [provider]  levalbuterol Pauline Aus HFA) 45 MCG/ACT inhaler Inhale 2 puffs into the lungs every 8 (eight) hours as needed for wheezing. 02/27/20   Just, Azalee Course, FNP  lidocaine (LIDODERM) 5 % Place 1 patch onto the skin daily. Remove & Discard patch within 12 hours or as directed by MD 02/25/23   Arabella Merles, PA-C  losartan (COZAAR) 100 MG tablet TAKE ONE TABLET BY MOUTH DAILY Patient taking differently: 50 mg. 10/07/20   Janeece Agee, NP  lubiprostone (AMITIZA) 8 MCG capsule TAKE ONE CAPSULE BY MOUTH TWICE A DAY WITH A MEAL 09/07/20   [provider]  meclizine (ANTIVERT) 25 MG tablet Take 1 tablet (25 mg total) by mouth 3 (three) times daily as needed for dizziness. 09/23/20   York Spaniel, MD  Menthol (BIOFREEZE) 5 % [facility] Apply 1 patch topically as needed.    [provider]  metoprolol succinate (TOPROL-XL) 25 MG 24 hr tablet TAKE ONE TABLET BY MOUTH DAILY Patient taking differently: 12.5 mg. 08/05/20   Janeece Agee, NP  Multiple Vitamin (MULTIVITAMIN PO) Take by mouth.    [provider]  naproxen (NAPROSYN) 500 MG tablet Take 1 tablet (500 mg total) by mouth every 12 (twelve) hours as needed. 11/16/20   York Spaniel, MD  omeprazole (  PRILOSEC) 40 MG capsule Take 1 capsule (40 mg total) by mouth daily. 01/03/20   Janeece Agee, NP  orphenadrine (NORFLEX) 100 MG tablet TAKE ONE TABLET BY MOUTH TWICE A DAY AS NEEDED FOR MUSCLE SPASMS 11/05/20   Janeece Agee, NP  pregabalin (LYRICA) 200 MG capsule Take 1 capsule (200 mg total) by mouth 3 (three) times daily. 01/14/21   Levert Feinstein, MD  rizatriptan (MAXALT) 10 MG tablet Take one tablet up to three times a day if needed 09/28/22   Glean Salvo, NP  rosuvastatin (CRESTOR) 20 MG tablet TAKE ONE TABLET BY MOUTH EVERY EVENING 10/20/20   Janeece Agee, NP  temazepam (RESTORIL) 15 MG capsule 30 mg.    [provider]  traZODone  (DESYREL) 50 MG tablet Take 50 mg by mouth at bedtime. 09/19/20   [provider]  zolpidem (AMBIEN) 10 MG tablet Take 2 tablets by mouth at bedtime.  04/06/16   [provider]      Allergies    Amlodipine, Clonidine, Diclofenac, Hydrochlorothiazide, Sulfa antibiotics, Famciclovir, Metronidazole, Soma [carisoprodol], Acyclovir, Acyclovir and related, Benadryl [diphenhydramine hcl], Cephalosporins, Codeine, Cyclobenzaprine, Cymbalta [duloxetine hcl], Diclofenac sodium, Dicyclomine, Hydrocodone, Hydrocodone-acetaminophen, Linzess [linaclotide], Nitrofurantoin monohyd macro, Suboxone [buprenorphine hcl-naloxone hcl], Tizanidine, Tramadol, Baclofen, Drug ingredient [ganciclovir], Meloxicam, and Sulfamethoxazole    Review of Systems   Review of Systems  Physical Exam Updated Vital Signs BP 134/79   Pulse 82   Temp 97.8 F (36.6 C) (Oral)   Resp 18   Ht 5\' 3"  (1.6 m)   Wt 78 kg   SpO2 98%   BMI 30.46 kg/m    Physical Exam Vitals and nursing note reviewed.  Constitutional:      Appearance: She is well-developed.  HENT:     Head: Normocephalic and atraumatic.  Eyes:     Conjunctiva/sclera: Conjunctivae normal.  Pulmonary:     Effort: No respiratory distress.  Chest:     Chest wall: Tenderness present. No deformity.     Comments: Patient is tender over the left posterior ribs, close to the border with the thoracic spine.  No ecchymosis noted.  No deformities palpated.  Patient winces when I press in this area. Musculoskeletal:     Cervical back: Normal range of motion and neck supple.  Skin:    General: Skin is warm and dry.  Neurological:     Mental Status: She is alert.     ED Results / Procedures / Treatments   Labs (all labs ordered are listed, but only abnormal results are displayed) Labs Reviewed - No data to display  EKG None  Radiology DG Ribs Unilateral W/Chest Left Result Date: 03/16/2023 CLINICAL DATA:  No compression fractures and rib  fractures from falling last month. Patient reports falling 4-5 days ago. Left posterior chest pain. EXAM: LEFT RIBS AND CHEST - 3+ VIEW COMPARISON:  Chest CT 02/25/2023.  Chest radiographs 01/20/2023. FINDINGS: The heart size and mediastinal contours are stable. The lungs are clear. There is no pleural effusion or pneumothorax. There are mildly displaced fractures of the left 6th and 7th ribs laterally. The 7th rib fracture was not clearly seen previously and may be acute. IMPRESSION: Mildly displaced fractures of the left 6th and 7th ribs laterally. The 7th rib fracture was not clearly seen previously and may be acute. No pneumothorax or pleural effusion. Electronically Signed   By: Carey Bullocks M.D.   On: 03/16/2023 16:18    Procedures Procedures    Medications Ordered in ED Medications -  No data to display  ED Course/ Medical Decision Making/ A&P    Patient seen and examined. History obtained directly from patient.   Labs/EKG: None ordered  Imaging: Rib/chest x-rays  Medications/Fluids: None ordered  Most recent vital signs reviewed and are as follows: BP 134/79   Pulse 82   Temp 97.8 F (36.6 C) (Oral)   Resp 18   Ht 5\' 3"  (1.6 m)   Wt 78 kg   SpO2 98%   BMI 30.46 kg/m   Initial impression: Chest wall contusion on background of subacute to chronic pain from compression fractures in the spine and previous rib fracture.  4:47 PM Reassessment performed. Patient appears stable.  She is up ambulating in the room.  Imaging personally visualized and interpreted including: X-ray shows likely new seventh rib fracture.  Reviewed pertinent lab work and imaging with patient at bedside. Questions answered.   Most current vital signs reviewed and are as follows: BP 134/79   Pulse 82   Temp 97.8 F (36.6 C) (Oral)   Resp 18   Ht 5\' 3"  (1.6 m)   Wt 78 kg   SpO2 98%   BMI 30.46 kg/m   Plan: Discharge to home.   Prescriptions written for: Naproxen/Lidoderm patch.  # 5  tablets oxycodone 5 mg for more severe breakthrough pain.  Other home care instructions discussed: Discussed at length, precautions with stronger pain medication, concern for fall risk.  ED return instructions discussed: New or worsening symptoms  Follow-up instructions discussed: Patient encouraged to follow-up with their PCP in 7 days.                                   Medical Decision Making Amount and/or Complexity of Data Reviewed Radiology: ordered.  Risk Prescription drug management.   Patient presents with worsening left-sided posterior rib pain after fall.  She has had injuries from recent falls.  Lungs are clear, no signs of pneumonia.  Main issue currently is pain.  Will attempt to control symptoms with NSAIDs and topical medications.  Patient given a small amount of oxycodone, 5 tablets, to use for breakthrough pain as she does have a painful injury.  Fall risk discussed at length.  Advised to use appropriate precautions.  Advised PCP follow-up, it sounds like they have been considering physical therapy.  Patient is a little reluctant to do this currently.        Final Clinical Impression(s) / ED Diagnoses Final diagnoses:  Closed fracture of one rib of left side, initial encounter    Rx / DC Orders ED Discharge Orders          Ordered    oxyCODONE (OXY IR/ROXICODONE) 5 MG immediate release tablet  Every 6 hours PRN        03/16/23 1645    naproxen (NAPROSYN) 500 MG tablet  Every 12 hours PRN        03/16/23 1645    lidocaine (LIDODERM) 5 %  Every 24 hours        03/16/23 1645    lidocaine (LIDODERM) 5 %  Every 24 hours,   Status:  Discontinued        03/16/23 1645              Renne Crigler, PA-C 03/16/23 1650    Durwin Glaze, MD 03/17/23 [phone]

## 2023-04-11 ENCOUNTER — Encounter: Payer: Self-pay | Admitting: Neurology

## 2023-05-04 ENCOUNTER — Telehealth: Payer: Self-pay

## 2023-05-04 NOTE — Telephone Encounter (Signed)
 Called pt and let her know how to do a MyChart Visit in her MyChart. Pt thanked me for the call, and has understanding on how to log on for her Appointment on 05/10/23 @ 4:00pm.

## 2023-05-10 ENCOUNTER — Telehealth: Admitting: Neurology

## 2023-05-10 DIAGNOSIS — G43019 Migraine without aura, intractable, without status migrainosus: Secondary | ICD-10-CM | POA: Diagnosis not present

## 2023-05-10 MED ORDER — AIMOVIG 140 MG/ML ~~LOC~~ SOAJ: 140.0000 mg | SUBCUTANEOUS | 11 refills | Status: AC

## 2023-05-10 MED ORDER — RIZATRIPTAN BENZOATE 10 MG PO TABS
ORAL_TABLET | ORAL | 11 refills | Status: AC
Start: 2023-05-10 — End: ?

## 2023-05-10 NOTE — Progress Notes (Signed)
 PATIENT: Jasmine [name] DOB: 11-08-1955  REASON FOR VISIT: Follow up for migraines HISTORY FROM: Patient PRIMARY NEUROLOGIST: Dr. Lucia [name]   HISTORY OF PRESENT ILLNESS:  Virtual Visit via Video Note  I connected with Jasmine [name] on 05/10/23 at  4:00 PM EDT by a video enabled telemedicine application and verified that I am speaking with the correct person using two identifiers.  Location: Patient: in the car Provider: in the office    I discussed the limitations of evaluation and management by telemedicine and the availability of in person appointments. The patient expressed understanding and agreed to proceed.  Update 05/10/23 SS: Had L2 compression fracture, T6 compression fracture.  On Lyrica, plan for T6 and T7 kyphoplasty.  When she last saw Dr. Lucia [name] on Maxalt and Aimovig 140. Her migraines are doing much better. Needs refill of Maxalt. Hasn't needed this month. On average 2-3 a month. Tolerating Aimovig well. Has pain specialist and close follow up with primary care. She seems to be doing well.  She asked if the persistent left mastoid effusion could be contributing to her balance. Dr. Lucia [name] had recommended following up with ENT. She agrees doing well overall.   Update 02/02/2022: Here for review of MRi brain which was unremarkable. She has a Persistent left mastoid effusion. Otherwise, no abnormality identified. I explained this to husband and patient., reviewed anatomy, causes, answered all questions She can follow up with ENT for this. Otherwise she is doing well on her migraine management, will refill meds.   Patient complains of symptoms per HPI as well as the following symptoms: cochlear implant . Pertinent negatives and positives per HPI. All others negative  Today 09/29/2021 Here today with her husband. Seen for sooner appointment. Claims referred from cardiologist who she was seeing for CP for vertigo. Vertigo started 18 weeks ago. Feels the room is spinning when she bends over  and gets weak. Had cardiac stress test was normal reportedly. Felt CP was related to BP flu cations. Remains on Aimovig 140 mg monthly injection. Last injection was in June, prior to that missed several months. Her husband has fear of injecting his wife. Has had few migraines, not sure how many. Takes Maxalt, uses all 10 tablets monthly. It does help. Headache is to the top right vertex. Feels vision toward end of the day is blurry, has seen [facility] doctor. Most times has bad headache, sensitive to light, sound, nausea. Concerned with functioning of the brain, hard time concentrating, focusing. Feels like she is weak. Feels these symptoms over the last 18 weeks. No longer on oxycodone. Towards the end of the day her speech is slurred. PCP gives Lyrica for fibro. Is most concerned with the functioning of her brain, something not right neurologically. Has left cochlear implant. No falls. 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 Her speech is slurred at times. She brought 3 cushions to sit on.  She and her husband are poor historians. She cancelled this appointment, then arrived 2 minutes before appointment time to be seen.   Update 05/19/21 SS: Jasmine [name] here today for follow-up. Has about 1-2 migraines a week, on Aimovig 140 mg monthly, tolerates well. Takes maxalt for acute headache, works well. Takes the Lyrica up to 200 mg 3 times daily for fibro pain, it helps. Goes to [facility] when injection of low back is needed. Vertigo flares intermittently. Takes meclizine with good benefit. Brought her own cushions for the chair. Sees Jasmine [name], but claims not  taking any "strong" medications. PCP will prescribe Lyrica for her. On prednisone for sinus infection currently. Has left cochlear implant.  HISTORY  11/16/2020 Jasmine [name]: Jasmine [name] is a 68 year old left-handed white female with a history of migraine headaches.  The patient is on Aimovig with some benefit.  She is having 1 or 2 headaches a  week, about twice a month the headaches may be more incapacitating.  The patient is tolerating Aimovig well.  She has Maxalt, but she has run out of the medication.  This was helpful for her headache.  The patient has fibromyalgia, she was switched to Lyrica which seems to have worked much better for her fibromyalgia pain.  She is being followed through Dr. Shelle Iron from orthopedic surgery for back discomfort.  She has been referred to Dr. Ethelene Hal after MRI evaluation of the back.  She apparently had an exacerbation of back pain after she lifted a 25 pound bag of mulch on 30 September 2020.  REVIEW OF SYSTEMS: Out of a complete 14 system review of symptoms, the patient complains only of the following symptoms, and all other reviewed systems are negative.  See HPI  ALLERGIES: Allergies  Allergen Reactions   Amlodipine Shortness Of Breath    Pt states she also had very bad pain    Clonidine Palpitations   Diclofenac Nausea And Vomiting and Nausea Only   Hydrochlorothiazide Shortness Of Breath   Sulfa Antibiotics Hives and Rash   Famciclovir Other (See Comments)   Metronidazole Nausea And Vomiting   Soma [Carisoprodol] Other (See Comments)    Moody and sleepy Cognitive Function, Daytime sleepiness   Acyclovir    Acyclovir And Related    Benadryl [Diphenhydramine Hcl]     Severe emotional reaction and doesn't work well with Pt. Past history   Cephalosporins Hives   Codeine Other (See Comments)    Makes patient feel odd ; "sometimes mild; sometimes severe reaction; mostly severe"   Cyclobenzaprine Other (See Comments)    Muscle Aches.   Cymbalta [Duloxetine Hcl]     Stomach upset   Diclofenac Sodium Nausea And Vomiting   Dicyclomine    Hydrocodone    Hydrocodone-Acetaminophen    Linzess [Linaclotide] Diarrhea   Nitrofurantoin Monohyd Macro     Felt funny   Suboxone [Buprenorphine Hcl-Naloxone Hcl] Nausea And Vomiting   Tizanidine     Other reaction(s): Other (See Comments) insomnia    Tramadol    Baclofen Nausea Only   Drug Ingredient [Ganciclovir] Rash   Meloxicam     Other reaction(s): Confusion   Sulfamethoxazole Rash    HOME MEDICATIONS: Outpatient Medications Prior to Visit  Medication Sig Dispense Refill   acetaminophen (TYLENOL) 325 MG tablet Take 2 tablets (650 mg total) by mouth every 6 (six) hours as needed for mild pain (pain score 1-3). 30 tablet 0   AIMOVIG 140 MG/ML SOAJ INJECT 140MG  UNDER THE SKIN EVERY 30 DAYS 1 mL 11   azelastine (ASTELIN) 0.1 % nasal spray Place 1 spray into both nostrils 2 (two) times daily. Use in each nostril as directed 30 mL 6   azelastine (ASTELIN) 0.1 % nasal spray Place into the nose.     B COMPLEX VITAMINS PO Take by mouth.     diclofenac Sodium (VOLTAREN) 1 % GEL Apply 2 g topically 4 (four) times daily. 100 g 1   felodipine (PLENDIL) 10 MG 24 hr tablet Take 1 tablet (10 mg total) by mouth daily. 90 tablet 3   fluticasone (FLONASE) 50  MCG/ACT nasal spray SPRAY TWO SPRAYS IN EACH NOSTRIL ONCE DAILY 16 mL 0   Fluticasone-Salmeterol (ADVAIR) 100-50 MCG/DOSE AEPB      levalbuterol (XOPENEX HFA) 45 MCG/ACT inhaler Inhale 2 puffs into the lungs every 8 (eight) hours as needed for wheezing. 1 each 3   lidocaine (LIDODERM) 5 % Place 1 patch onto the skin daily. Remove & Discard patch within 12 hours or as directed by MD 30 patch 0   losartan (COZAAR) 100 MG tablet TAKE ONE TABLET BY MOUTH DAILY (Patient taking differently: 50 mg.) 30 tablet 3   lubiprostone (AMITIZA) 8 MCG capsule TAKE ONE CAPSULE BY MOUTH TWICE A DAY WITH A MEAL     meclizine (ANTIVERT) 25 MG tablet Take 1 tablet (25 mg total) by mouth 3 (three) times daily as needed for dizziness. 30 tablet 3   Menthol (BIOFREEZE) 5 % PTCH Apply 1 patch topically as needed.     metoprolol succinate (TOPROL-XL) 25 MG 24 hr tablet TAKE ONE TABLET BY MOUTH DAILY (Patient taking differently: 12.5 mg.) 90 tablet 1   Multiple Vitamin (MULTIVITAMIN PO) Take by mouth.     naproxen  (NAPROSYN) 500 MG tablet Take 1 tablet (500 mg total) by mouth every 12 (twelve) hours as needed. 30 tablet 0   omeprazole (PRILOSEC) 40 MG capsule Take 1 capsule (40 mg total) by mouth daily. 90 capsule 3   orphenadrine (NORFLEX) 100 MG tablet TAKE ONE TABLET BY MOUTH TWICE A DAY AS NEEDED FOR MUSCLE SPASMS 60 tablet 0   oxyCODONE (OXY IR/ROXICODONE) 5 MG immediate release tablet Take 1 tablet (5 mg total) by mouth every 6 (six) hours as needed for severe pain (pain score 7-10). 5 tablet 0   pregabalin (LYRICA) 200 MG capsule Take 1 capsule (200 mg total) by mouth 3 (three) times daily. 90 capsule 5   rizatriptan (MAXALT) 10 MG tablet Take one tablet up to three times a day if needed 10 tablet 11   rosuvastatin (CRESTOR) 20 MG tablet TAKE ONE TABLET BY MOUTH EVERY EVENING 30 tablet 1   temazepam (RESTORIL) 15 MG capsule 30 mg.     traZODone (DESYREL) 50 MG tablet Take 50 mg by mouth at bedtime.     zolpidem (AMBIEN) 10 MG tablet Take 2 tablets by mouth at bedtime.      No facility-administered medications prior to visit.    PAST MEDICAL HISTORY: Past Medical History:  Diagnosis Date   Achilles tendinosis of left lower extremity 01/23/2017   Anxiety    Asthma    Benign paroxysmal positional vertigo 01/05/2016   Bilateral sensorineural hearing loss 12/22/2014   Blepharitis of upper and lower eyelids of both eyes 04/27/2016   Bradycardia 09/22/2014   Cervicogenic headache 08/24/2016   Chronic high back pain    CKD (chronic kidney disease) 12/08/2014   Cochlear implant in place    Colon polyp    Common migraine with intractable migraine 02/19/2020   Constipation    Diverticulitis    Diverticulosis    DJD (degenerative joint disease)    Double vision    Dry eye syndrome of both lacrimal glands 04/27/2016   Fibromyalgia    Heart murmur    High cholesterol    Hypercalcemia    Hypercholesterolemia    Hyperlipidemia 02/01/2016   Hypertension    Hypoglycemia    Insomnia 09/22/2010    Kidney stone    Meniscus tear    Right   Migraine 08/07/2015   Mild intermittent asthma without  complication 09/22/2010   Nephrolithiasis 03/17/2014   Nocturnal leg cramps 05/31/2019   Osteoporosis    Primary hyperparathyroidism (HCC) 12/10/2014   PTSD (post-traumatic stress disorder)    Renal cyst 05/12/2013   Schizophrenia (HCC)    treated by Dr. Donell Beers   Schizophrenia [facility])    Seizures (HCC)    last seizure 3 years ago, not on AED   Stroke (HCC) 07/08/11   "I've had mini strokes before; left side of face  is more down than right "   Syncope and collapse    One episode - 07/10/15   TIA (transient ischemic attack)     PAST SURGICAL HISTORY: Past Surgical History:  Procedure Laterality Date   CATARACT EXTRACTION     COCHLEAR IMPLANT  2010   left   DILATION AND CURETTAGE OF UTERUS     HERNIA REPAIR     INGUINAL HERNIA REPAIR  1985   left   KIDNEY STONE SURGERY  ~ 2006   PARATHYROIDECTOMY     skin cancer removal     TONSILLECTOMY     "as a child"   tooth removal     WISDOM TOOTH EXTRACTION      FAMILY HISTORY: Family History  Problem Relation Age of Onset   Leukemia Mother 43       Deceased   Heart attack Father 65       Living   Arthritis Father    Skin cancer Father    Hypertension Father    Hyperlipidemia Father    Fibromyalgia Sister    Neuropathy Sister    Headache Sister    HIV Sister    Fibromyalgia Sister    Neuropathy Sister    Headache Sister    Arthritis/Rheumatoid Sister    Neuropathy Sister    Thyroid disease Sister    Neuropathy Sister    Neuropathy Brother        Peripheal   Alzheimer's disease Paternal Aunt        X2   Stomach cancer Paternal Aunt        x1   Arthritis/Rheumatoid Paternal Aunt        x1   Diabetes Paternal Grandmother     SOCIAL HISTORY: Social History   Socioeconomic History   Marital status: Married    Spouse name: Alinda Money   Number of children: 3   Years of education: 2 yrs coll   Highest education level:  Not on file  Occupational History   Occupation: Disabled  Tobacco Use   Smoking status: Former    Current packs/day: 0.00    Average packs/day: 1 pack/day for 3.0 years (3.0 ttl pk-yrs)    Types: Cigarettes    Start date: 02/28/2001    Quit date: 02/29/2004    Years since quitting: 19.2   Smokeless tobacco: Never  Vaping Use   Vaping status: Never Used  Substance and Sexual Activity   Alcohol use: Not Currently    Comment: "glass of wine q once in awhile; not very often; do it on special occasion"   Drug use: No   Sexual activity: Never  Other Topics Concern   Not on file  Social History Narrative   Lives at home with her husband.   Caffeine 6-7 cups caffeine daily   Left-handed.   Social Drivers of Corporate investment banker Strain: Low Risk  (03/07/2023)   Received from [facility]   Overall Financial Resource Strain (CARDIA)    Difficulty of Paying Living  Expenses: Not very hard  Food Insecurity: No Food Insecurity (03/07/2023)   Received from [facility]   Hunger Vital Sign    Worried About Running Out of Food in the Last Year: Never true    Ran Out of Food in the Last Year: Never true  Transportation Needs: No Transportation Needs (03/07/2023)   Received from [facility] - Transportation    Lack of Transportation (Medical): No    Lack of Transportation (Non-Medical): No  Physical Activity: Sufficiently Active (11/16/2022)   Received from [facility]   Exercise Vital Sign    Days of Exercise per Week: 3 days    Minutes of Exercise per Session: 120 min  Stress: Stress Concern Present (11/16/2022)   Received from [facility] - Occupational Stress Questionnaire    Feeling of Stress : Rather much  Social Connections: Socially Integrated (11/16/2022)   Received from [facility]   Social Network    How would you rate your social network (family, work, friends)?: Good participation with social networks  Intimate  Partner Violence: Not At Risk (11/16/2022)   Received from [facility]   HITS    Over the last 12 months how often did your partner physically hurt you?: Never    Over the last 12 months how often did your partner insult you or talk down to you?: Never    Over the last 12 months how often did your partner threaten you with physical harm?: Never    Over the last 12 months how often did your partner scream or curse at you?: Sometimes    PHYSICAL EXAM  There were no vitals filed for this visit.  Via video visit, is alert and oriented, speech is clear and concise, seated in her car   DIAGNOSTIC DATA (LABS, IMAGING, TESTING) - I reviewed patient records, labs, notes, testing and imaging myself where available.  Lab Results  Component Value Date   WBC 10.1 02/25/2023   HGB 13.6 02/25/2023   HCT 41.8 02/25/2023   MCV 87.8 02/25/2023   PLT 255 02/25/2023      Component Value Date/Time   NA 136 02/25/2023 1645   NA 145 (H) 03/25/2020 1204   K 4.0 02/25/2023 1645   CL 104 02/25/2023 1645   CO2 23 02/25/2023 1645   GLUCOSE 102 (H) 02/25/2023 1645   BUN 22 02/25/2023 1645   BUN 15 03/25/2020 1204   CREATININE 0.72 02/25/2023 1645   CALCIUM 9.2 02/25/2023 1645   PROT 6.5 09/16/2020 1353   PROT 6.7 03/25/2020 1204   ALBUMIN 4.4 09/16/2020 1353   ALBUMIN 4.4 03/25/2020 1204   AST 23 09/16/2020 1353   ALT 24 09/16/2020 1353   ALKPHOS 72 09/16/2020 1353   BILITOT 0.5 09/16/2020 1353   BILITOT 0.4 03/25/2020 1204   GFRNONAA >60 02/25/2023 1645   GFRAA 108 03/25/2020 1204   Lab Results  Component Value Date   CHOL 180 10/04/2019   HDL 63 10/04/2019   LDLCALC 106 (H) 10/04/2019   TRIG 57 10/04/2019   CHOLHDL 2.9 10/04/2019   Lab Results  Component Value Date   HGBA1C 6.1 09/16/2020   Lab Results  Component Value Date   VITAMINB12 1,246 (H) 10/04/2019   Lab Results  Component Value Date   TSH 4.460 10/04/2019    ASSESSMENT AND PLAN 68 y.o. year old female   1.   Migraine headache  -Under very good control -Continue Aimovig 140  mg monthly injection for migraine prevention -Continue Maxalt 10 mg as needed for acute migraine -Can follow-up with ENT regarding persistent left mastoid effusion -Has close follow-up with pain management, primary care -Follow-up here in 1 year for migraine management -She has 28 medication allergies/interaction  Fibromyalgia - RETURN TO PAIN MANAGEMENT AND PCP Chronic low back pain - RETURN TO PAIN MANAGEMENT AND PCP  Otila Kluver, DNP  [facility] [address] [place], [place] [address] [phone]

## 2023-05-10 NOTE — Patient Instructions (Signed)
 Great to see you today.  I am glad your migraines are doing well!!  I will refill your Aimovig and Maxalt.  Please call if migraines increase.  Thanks!

## 2023-08-20 ENCOUNTER — Other Ambulatory Visit: Payer: Self-pay

## 2023-08-20 ENCOUNTER — Emergency Department (HOSPITAL_BASED_OUTPATIENT_CLINIC_OR_DEPARTMENT_OTHER): Admission: EM | Admit: 2023-08-20 | Discharge: 2023-08-20 | Disposition: A | Discharge status: Home / Self Care

## 2023-08-20 ENCOUNTER — Emergency Department (HOSPITAL_BASED_OUTPATIENT_CLINIC_OR_DEPARTMENT_OTHER)

## 2023-08-20 ENCOUNTER — Encounter (HOSPITAL_BASED_OUTPATIENT_CLINIC_OR_DEPARTMENT_OTHER): Payer: Self-pay | Admitting: Emergency Medicine

## 2023-08-20 DIAGNOSIS — Z7951 Long term (current) use of inhaled steroids: Secondary | ICD-10-CM | POA: Insufficient documentation

## 2023-08-20 DIAGNOSIS — Z79899 Other long term (current) drug therapy: Secondary | ICD-10-CM | POA: Diagnosis not present

## 2023-08-20 DIAGNOSIS — J45909 Unspecified asthma, uncomplicated: Secondary | ICD-10-CM | POA: Diagnosis not present

## 2023-08-20 DIAGNOSIS — I10 Essential (primary) hypertension: Secondary | ICD-10-CM | POA: Diagnosis not present

## 2023-08-20 DIAGNOSIS — R509 Fever, unspecified: Secondary | ICD-10-CM | POA: Diagnosis present

## 2023-08-20 DIAGNOSIS — U071 COVID-19: Secondary | ICD-10-CM | POA: Diagnosis not present

## 2023-08-20 DIAGNOSIS — N3001 Acute cystitis with hematuria: Secondary | ICD-10-CM | POA: Insufficient documentation

## 2023-08-20 LAB — PROTIME-INR
INR: 1 (ref 0.8–1.2)
Prothrombin Time: 13.9 s (ref 11.4–15.2)

## 2023-08-20 LAB — CBC WITH DIFFERENTIAL/PLATELET
Abs Immature Granulocytes: 0.02 10*3/uL (ref 0.00–0.07)
Basophils Absolute: 0 10*3/uL (ref 0.0–0.1)
Basophils Relative: 0 %
Eosinophils Absolute: 0 10*3/uL (ref 0.0–0.5)
Eosinophils Relative: 0 %
HCT: 42.4 % (ref 36.0–46.0)
Hemoglobin: 13.8 g/dL (ref 12.0–15.0)
Immature Granulocytes: 0 %
Lymphocytes Relative: 30 %
Lymphs Abs: 2.9 10*3/uL (ref 0.7–4.0)
MCH: 27.8 pg (ref 26.0–34.0)
MCHC: 32.5 g/dL (ref 30.0–36.0)
MCV: 85.5 fL (ref 80.0–100.0)
Monocytes Absolute: 0.7 10*3/uL (ref 0.1–1.0)
Monocytes Relative: 7 %
Neutro Abs: 6 10*3/uL (ref 1.7–7.7)
Neutrophils Relative %: 63 %
Platelets: 219 10*3/uL (ref 150–400)
RBC: 4.96 MIL/uL (ref 3.87–5.11)
RDW: 14.4 % (ref 11.5–15.5)
WBC: 9.7 10*3/uL (ref 4.0–10.5)
nRBC: 0 % (ref 0.0–0.2)

## 2023-08-20 LAB — URINALYSIS, W/ REFLEX TO CULTURE (INFECTION SUSPECTED)
Bilirubin Urine: NEGATIVE
Glucose, UA: NEGATIVE mg/dL
Ketones, ur: NEGATIVE mg/dL
Nitrite: POSITIVE — AB
Protein, ur: 30 mg/dL — AB
Specific Gravity, Urine: 1.02 (ref 1.005–1.030)
pH: 6 (ref 5.0–8.0)

## 2023-08-20 LAB — COMPREHENSIVE METABOLIC PANEL WITH GFR
ALT: 22 U/L (ref 0–44)
AST: 26 U/L (ref 15–41)
Albumin: 4.5 g/dL (ref 3.5–5.0)
Alkaline Phosphatase: 91 U/L (ref 38–126)
Anion gap: 12 (ref 5–15)
BUN: 12 mg/dL (ref 8–23)
CO2: 24 mmol/L (ref 22–32)
Calcium: 9.3 mg/dL (ref 8.9–10.3)
Chloride: 103 mmol/L (ref 98–111)
Creatinine, Ser: 0.62 mg/dL (ref 0.44–1.00)
GFR, Estimated: >60 mL/min (ref >60)
Glucose, Bld: 105 mg/dL — ABNORMAL HIGH (ref 70–99)
Potassium: 3.6 mmol/L (ref 3.5–5.1)
Sodium: 139 mmol/L (ref 135–145)
Total Bilirubin: 0.5 mg/dL (ref 0.0–1.2)
Total Protein: 7.4 g/dL (ref 6.5–8.1)

## 2023-08-20 LAB — RESP PANEL BY RT-PCR (RSV, FLU A&B, COVID)  RVPGX2
Influenza A by PCR: NEGATIVE
Influenza B by PCR: NEGATIVE
Resp Syncytial Virus by PCR: NEGATIVE
SARS Coronavirus 2 by RT PCR: POSITIVE — AB

## 2023-08-20 LAB — LACTIC ACID, PLASMA: Lactic Acid, Venous: 0.9 mmol/L (ref 0.5–1.9)

## 2023-08-20 MED ORDER — ALBUTEROL SULFATE HFA 108 (90 BASE) MCG/ACT IN AERS
1.0000 | INHALATION_SPRAY | Freq: Once | RESPIRATORY_TRACT | Status: AC
  Administered 2023-08-20: 2 via RESPIRATORY_TRACT
  Filled 2023-08-20: qty 6.7

## 2023-08-20 MED ORDER — CIPROFLOXACIN IN D5W 400 MG/200ML IV SOLN
400.0000 mg | Freq: Once | INTRAVENOUS | Status: AC
  Administered 2023-08-20: 400 mg via INTRAVENOUS
  Filled 2023-08-20: qty 200

## 2023-08-20 MED ORDER — FLUTICASONE PROPIONATE 50 MCG/ACT NA SUSP: 1.0000 | Freq: Every day | NASAL | 0 refills | Status: AC

## 2023-08-20 MED ORDER — DEXAMETHASONE SODIUM PHOSPHATE 10 MG/ML IJ SOLN
10.0000 mg | Freq: Once | INTRAMUSCULAR | Status: AC
  Administered 2023-08-20: 10 mg via INTRAVENOUS
  Filled 2023-08-20: qty 1

## 2023-08-20 MED ORDER — ONDANSETRON 4 MG PO TBDP
4.0000 mg | ORAL_TABLET | Freq: Three times a day (TID) | ORAL | 0 refills | Status: DC | PRN
Start: 2023-08-20 — End: 2023-08-20

## 2023-08-20 MED ORDER — IPRATROPIUM-ALBUTEROL 0.5-2.5 (3) MG/3ML IN SOLN
3.0000 mL | Freq: Once | RESPIRATORY_TRACT | Status: AC
  Administered 2023-08-20: 3 mL via RESPIRATORY_TRACT
  Filled 2023-08-20: qty 3

## 2023-08-20 MED ORDER — FLUTICASONE PROPIONATE 50 MCG/ACT NA SUSP
1.0000 | Freq: Every day | NASAL | 0 refills | Status: DC
Start: 2023-08-20 — End: 2023-08-20

## 2023-08-20 MED ORDER — CIPROFLOXACIN HCL 500 MG PO TABS: 500.0000 mg | ORAL_TABLET | Freq: Two times a day (BID) | ORAL | 0 refills | Status: AC

## 2023-08-20 MED ORDER — CIPROFLOXACIN HCL 500 MG PO TABS
500.0000 mg | ORAL_TABLET | Freq: Two times a day (BID) | ORAL | 0 refills | Status: DC
Start: 2023-08-20 — End: 2023-08-20

## 2023-08-20 MED ORDER — BENZONATATE 100 MG PO CAPS
100.0000 mg | ORAL_CAPSULE | Freq: Three times a day (TID) | ORAL | 0 refills | Status: DC
Start: 2023-08-20 — End: 2023-08-20

## 2023-08-20 MED ORDER — ACETAMINOPHEN 500 MG PO TABS
1000.0000 mg | ORAL_TABLET | Freq: Once | ORAL | Status: AC
  Administered 2023-08-20: 1000 mg via ORAL
  Filled 2023-08-20: qty 2

## 2023-08-20 MED ORDER — ONDANSETRON 4 MG PO TBDP: 4.0000 mg | ORAL_TABLET | Freq: Three times a day (TID) | ORAL | 0 refills | Status: AC | PRN

## 2023-08-20 MED ORDER — SODIUM CHLORIDE 0.9 % IV BOLUS
500.0000 mL | Freq: Once | INTRAVENOUS | Status: AC
  Administered 2023-08-20: 500 mL via INTRAVENOUS

## 2023-08-20 MED ORDER — BENZONATATE 100 MG PO CAPS: 100.0000 mg | ORAL_CAPSULE | Freq: Three times a day (TID) | ORAL | 0 refills | Status: AC

## 2023-08-20 NOTE — ED Provider Notes (Signed)
 [place] [facility] AT [facility] Provider Note   CSN: [id number] Arrival date & time: 08/20/23  1614     Patient presents with: Cough and Fever   Jasmine [name] is a 68 y.o. female.  With past medical history of schizophrenia, seizure disorder, asthma cannot hypertension, hyperlipidemia, complex migraines presents to emergency room with complaint of 5 days of cough shortness of breath and fever.  Reports that over the last 2 days her symptoms have been worse.  She notes productive cough.  Notes she is only short of breath when she is walking around.  Denies orthopnea.  Denies swelling in feet and ankle.  Of note patient was diagnosed with UTI on 08/14/23 with primary care and started on amoxicillin .  She took 3 pills of amoxicillin  but due to her viral symptoms stopped taking it, no abx for last 3 days.     Cough Associated symptoms: fever   Fever Associated symptoms: cough and dysuria        Prior to Admission medications   Medication Sig Start Date End Date Taking? Authorizing Provider  acetaminophen  (TYLENOL ) 325 MG tablet Take 2 tablets (650 mg total) by mouth every 6 (six) hours as needed for mild pain (pain score 1-3). 01/20/23   Gray, Alicia P, DO  azelastine  (ASTELIN ) 0.1 % nasal spray Place 1 spray into both nostrils 2 (two) times daily. Use in each nostril as directed 05/19/20   Kip Ade, NP  azelastine  (ASTELIN ) 0.1 % nasal spray Place into the nose. 05/19/20   [provider]  B COMPLEX VITAMINS PO Take by mouth.    [provider]  diclofenac  Sodium (VOLTAREN ) 1 % GEL Apply 2 g topically 4 (four) times daily. 08/21/20   Kip Ade, NP  Erenumab -aooe (AIMOVIG ) 140 MG/ML SOAJ Inject 140 mg into the skin every 30 (thirty) days. 05/10/23   Gayland Lauraine PARAS, NP  felodipine  (PLENDIL ) 10 MG 24 hr tablet Take 1 tablet (10 mg total) by mouth daily. 01/03/20   Kip Ade, NP  fluticasone  (FLONASE ) 50 MCG/ACT nasal spray SPRAY TWO  SPRAYS IN EACH NOSTRIL ONCE DAILY 06/03/21   Kip Ade, NP  Fluticasone -Salmeterol (ADVAIR) 100-50 MCG/DOSE AEPB     [provider]  levalbuterol  (XOPENEX  HFA) 45 MCG/ACT inhaler Inhale 2 puffs into the lungs every 8 (eight) hours as needed for wheezing. 02/27/20   Just, Kelsea J, FNP  lidocaine  (LIDODERM ) 5 % Place 1 patch onto the skin daily. Remove & Discard patch within 12 hours or as directed by MD 03/16/23   Desiderio Chew, PA-C  losartan  (COZAAR ) 100 MG tablet TAKE ONE TABLET BY MOUTH DAILY Patient taking differently: 50 mg. 10/07/20   Kip Ade, NP  lubiprostone (AMITIZA) 8 MCG capsule TAKE ONE CAPSULE BY MOUTH TWICE A DAY WITH A MEAL 09/07/20   [provider]  meclizine  (ANTIVERT ) 25 MG tablet Take 1 tablet (25 mg total) by mouth 3 (three) times daily as needed for dizziness. 09/23/20   Jenel Carlin POUR, MD  Menthol (BIOFREEZE) 5 % [facility] Apply 1 patch topically as needed.    [provider]  metoprolol  succinate (TOPROL -XL) 25 MG 24 hr tablet TAKE ONE TABLET BY MOUTH DAILY Patient taking differently: 12.5 mg. 08/05/20   Kip Ade, NP  Multiple Vitamin (MULTIVITAMIN PO) Take by mouth.    [provider]  naproxen  (NAPROSYN ) 500 MG tablet Take 1 tablet (500 mg total) by mouth every 12 (twelve) hours as needed. 03/16/23   Geiple,  Fonda, PA-C  omeprazole  (PRILOSEC) 40 MG capsule Take 1 capsule (40 mg total) by mouth daily. 01/03/20   Kip Ade, NP  orphenadrine  (NORFLEX ) 100 MG tablet TAKE ONE TABLET BY MOUTH TWICE A DAY AS NEEDED FOR MUSCLE SPASMS 11/05/20   Kip Ade, NP  oxyCODONE  (OXY IR/ROXICODONE ) 5 MG immediate release tablet Take 1 tablet (5 mg total) by mouth every 6 (six) hours as needed for severe pain (pain score 7-10). 03/16/23   Geiple, Joshua, PA-C  pregabalin  (LYRICA ) 200 MG capsule Take 1 capsule (200 mg total) by mouth 3 (three) times daily. 01/14/21   Onita Duos, MD  rizatriptan  (MAXALT ) 10 MG tablet Take one tablet up  to three times a day if needed 05/10/23   Gayland Lauraine PARAS, NP  rosuvastatin  (CRESTOR ) 20 MG tablet TAKE ONE TABLET BY MOUTH EVERY EVENING 10/20/20   Kip Ade, NP  temazepam  (RESTORIL ) 15 MG capsule 30 mg.    [provider]  traZODone (DESYREL) 50 MG tablet Take 50 mg by mouth at bedtime. 09/19/20   [provider]  zolpidem  (AMBIEN ) 10 MG tablet Take 2 tablets by mouth at bedtime.  04/06/16   [provider]    Allergies: Amlodipine, Clonidine , Diclofenac , Hydrochlorothiazide, Sulfa antibiotics, Famciclovir, Metronidazole, Soma  [carisoprodol ], Acyclovir, Acyclovir and related, Benadryl [diphenhydramine hcl], Cephalosporins, Codeine, Cyclobenzaprine , Cymbalta  [duloxetine  hcl], Diclofenac  sodium, Dicyclomine, Hydrocodone , Hydrocodone -acetaminophen , Linzess [linaclotide], Nitrofurantoin monohyd macro, Suboxone [buprenorphine hcl-naloxone hcl], Tizanidine, Tramadol , Baclofen, Drug ingredient [ganciclovir], Meloxicam, and Sulfamethoxazole    Review of Systems  Constitutional:  Positive for fever.  Respiratory:  Positive for cough.   Genitourinary:  Positive for dysuria, frequency and urgency. Negative for flank pain, vaginal discharge and vaginal pain.    Updated Vital Signs BP 125/66 (BP Location: Right Arm)   Pulse (!) 107   Temp (!) 101.9 F (38.8 C)   Resp 18   Ht 5' 3 (1.6 m)   Wt 76.7 kg   SpO2 96%   BMI 29.94 kg/m   Physical Exam Vitals and nursing note reviewed.  Constitutional:      General: She is not in acute distress.    Appearance: She is not toxic-appearing.  HENT:     Head: Normocephalic and atraumatic.     Nose: Congestion and rhinorrhea present.   Eyes:     General: No scleral icterus.    Conjunctiva/sclera: Conjunctivae normal.    Cardiovascular:     Rate and Rhythm: Normal rate and regular rhythm.     Pulses: Normal pulses.     Heart sounds: Normal heart sounds.  Pulmonary:     Effort: Pulmonary effort is normal. No respiratory  distress.     Breath sounds: Rhonchi present.  Abdominal:     General: Abdomen is flat. Bowel sounds are normal. There is no distension.     Palpations: Abdomen is soft. There is no mass.     Tenderness: There is no abdominal tenderness. There is no right CVA tenderness or left CVA tenderness.   Musculoskeletal:     Right lower leg: No edema.     Left lower leg: No edema.   Skin:    General: Skin is warm and dry.     Findings: No lesion.   Neurological:     General: No focal deficit present.     Mental Status: She is alert and oriented to person, place, and time. Mental status is at baseline.     (all labs ordered are listed, but only abnormal results are  displayed) Labs Reviewed  RESP PANEL BY RT-PCR (RSV, FLU A&B, COVID)  RVPGX2 - Abnormal; Notable for the following components:      Result Value   SARS Coronavirus 2 by RT PCR POSITIVE (*)    All other components within normal limits  COMPREHENSIVE METABOLIC PANEL WITH GFR - Abnormal; Notable for the following components:   Glucose, Bld 105 (*)    All other components within normal limits  URINALYSIS, W/ REFLEX TO CULTURE (INFECTION SUSPECTED) - Abnormal; Notable for the following components:   APPearance CLOUDY (*)    Hgb urine dipstick TRACE (*)    Protein, ur 30 (*)    Nitrite POSITIVE (*)    Leukocytes,Ua SMALL (*)    Bacteria, UA MANY (*)    All other components within normal limits  CULTURE, BLOOD (ROUTINE X 2)  CULTURE, BLOOD (ROUTINE X 2)  URINE CULTURE  LACTIC ACID, PLASMA  CBC WITH DIFFERENTIAL/PLATELET  PROTIME-INR  LACTIC ACID, PLASMA    EKG: None  Radiology: No results found.   Procedures   Medications Ordered in the ED  ipratropium-albuterol  (DUONEB) 0.5-2.5 (3) MG/3ML nebulizer solution 3 mL (has no administration in time range)  ciprofloxacin  (CIPRO ) IVPB 400 mg (has no administration in time range)  sodium chloride  0.9 % bolus 500 mL (500 mLs Intravenous New Bag/Given 08/20/23 1731)   acetaminophen  (TYLENOL ) tablet 1,000 mg (1,000 mg Oral Given 08/20/23 1704)    Clinical Course as of 08/20/23 1857  Sun Aug 20, 2023  1747 Reassessed patient is overall feeling better.  Complains primarily of nasal congestion. Will PO challenge.  [JB]    Clinical Course User Index [JB] Gualberto Wahlen, Warren SAILOR, PA-C                                 Medical Decision Making Amount and/or Complexity of Data Reviewed Labs: ordered. Radiology: ordered.  Risk OTC drugs. Prescription drug management.   This patient presents to the ED for concern of flulike symptoms, this involves an extensive number of treatment options, and is a complaint that carries with it a high risk of complications and morbidity.  The differential diagnosis includes upper respiratory tract infection, asthma exacerbation, CHF, PE, pneumonia, dehydration, electrolyte abnormality, UTI, STD   Co morbidities that complicate the patient evaluation  Schizophrenia, seizure disorder, asthma cannot hypertension, hyperlipidemia, complex migraines   Additional history obtained:  Additional history obtained from office visit 08/14/2023 shows UTI report, abx Cipro  susceptible    Lab Tests:  I personally interpreted labs.  The pertinent results include:   CBC with no leukocytosis.  No significant anemia. CMP with no electrolyte abnormality.  Normal kidney and liver function. Lactate 0.9 PT/INR within normal limits UA is positive for nitrites leukocytes with many bacteria.  Will send urine for culture Blood cultures pending.    Imaging Studies ordered:  I ordered imaging studies including chest x-ray I independently visualized and interpreted imaging which showed no acute findings. I agree with the radiologist interpretation   Cardiac Monitoring: / EKG:  The patient was maintained on a cardiac monitor.     Problem List / ED Course / Critical interventions / Medication management  Patient reports to emergency room  with complaint of flulike symptoms also noting dysuria and urinary frequency.  Flulike symptoms started 5 days ago.  Was diagnosed with UTI 6 days ago.  She has not been compliant with antibiotics.  She has reportedly less  oral intake due to some associated nausea.  Her lungs are clear to auscultation bilaterally with no sign of respiratory distress.  No sign of fluid overload on exam.  She does have fever which I suspect is due to her positive COVID test and viral symptoms.  Mildly tachycardic at 107 with arrival but did improve after Tylenol  and normal saline.   Chest x-ray without evidence of pneumonia this feels fever cough shortness of breath is most likely secondary to viral illness.  Shortness of breath improved after DuoNeb.  She does have UTI but has not been compliant with medications.  She has no flank pain or suprapubic pain on exam.  She has no CVA tenderness. Tolerates oral intake. Feel OP Abx for UTI and close f/u with PCP.  I ordered medication including DuoNeb, Cipro , Tylenol  for fever Reevaluation of the patient after these medicines showed that the patient improved I have reviewed the patients home medicines and have made adjustments as needed   Plan  F/u w/ PCP in 2-3d to ensure resolution of sx.  Patient was given return precautions. Patient stable for discharge at this time.  Patient educated on sx/dx and verbalized understanding of plan. Return to ER w/ new or worsening sx.       Final diagnoses:  COVID  Acute cystitis with hematuria    ED Discharge Orders          Ordered    ciprofloxacin  (CIPRO ) 500 MG tablet  Every 12 hours        08/20/23 1743    ondansetron  (ZOFRAN -ODT) 4 MG disintegrating tablet  Every 8 hours PRN        08/20/23 1743    benzonatate  (TESSALON ) 100 MG capsule  Every 8 hours        08/20/23 1743    fluticasone  (FLONASE ) 50 MCG/ACT nasal spray  Daily        08/20/23 1820               Shermon Warren SAILOR, PA-C 08/20/23 1857    Ula Prentice SAUNDERS, MD 08/20/23 2324

## 2023-08-20 NOTE — ED Triage Notes (Signed)
 Pt c/o cough, SHOB, fever, decreased appetite x 3d

## 2023-08-20 NOTE — Discharge Instructions (Addendum)
 You have COVID which is a viral infection.  Make sure you are staying well-hydrated with primarily water but you can alternate Pedialyte and Gatorade drink.  Use Tylenol  1000 mg every 8 hours as well as ibuprofen  every 8 hours for fever and muscle aches.  Take Zofran  as needed for nausea vomiting.  You still have urinary tract infection which I suspect is because you have not been able to take your antibiotic.  However I would like you to discontinue this and start the Cipro  which have sent to your pharmacy.  Make sure you are taking this medicine as prescribed.  Follow-up with primary care.  Return to ER with new or worsening symptoms.

## 2023-08-20 NOTE — ED Notes (Addendum)
 Pt is eating and drinking well at discharge.   Pt provided discharge instructions and prescription information. Pt was given the opportunity to ask questions and questions were answered.

## 2023-08-22 LAB — URINE CULTURE: Culture: >=100000 — AB

## 2023-08-23 ENCOUNTER — Telehealth (HOSPITAL_BASED_OUTPATIENT_CLINIC_OR_DEPARTMENT_OTHER): Payer: Self-pay

## 2023-08-23 NOTE — Telephone Encounter (Signed)
 Post ED Visit - Positive Culture Follow-up  Culture report reviewed by antimicrobial stewardship pharmacist: [facility] [x]  [place], [place].D. []  Venetia Gully, Pharm.D., BCPS AQ-ID []  Garrel Crews, Pharm.D., BCPS []  Almarie Lunger, [address].D., BCPS []  [place], [place].D., BCPS, AAHIVP []  Rosaline Bihari, Pharm.D., BCPS, AAHIVP []  Vernell Meier, PharmD, BCPS []  Latanya Hint, PharmD, BCPS []  Donald Medley, PharmD, BCPS []  Rocky Bold, PharmD []  Dorothyann Alert, PharmD, BCPS []  Morene Babe, PharmD  [facility] []  Rosaline Edison, PharmD []  Romona Bliss, PharmD []  Dolphus Roller, PharmD []  Veva Seip, Rph []  Vernell Daunt) Leonce, PharmD []  Eva Allis, PharmD []  Rosaline Millet, PharmD []  Iantha Batch, PharmD []  Arvin Gauss, PharmD []  Wanda Hasting, PharmD []  Ronal Rav, PharmD []  Rocky Slade, PharmD []  Bard Jeans, PharmD   Positive urine culture Treated with Ciprofloxacin , organism sensitive to the same and no further patient follow-up is required at this time.  Jasmine [name] 08/23/2023, 10:18 AM

## 2023-08-25 LAB — CULTURE, BLOOD (ROUTINE X 2)
Culture: NO GROWTH
Culture: NO GROWTH
Special Requests: ADEQUATE

## 2023-08-28 ENCOUNTER — Emergency Department (HOSPITAL_BASED_OUTPATIENT_CLINIC_OR_DEPARTMENT_OTHER)

## 2023-08-28 ENCOUNTER — Other Ambulatory Visit: Payer: Self-pay

## 2023-08-28 ENCOUNTER — Encounter (HOSPITAL_BASED_OUTPATIENT_CLINIC_OR_DEPARTMENT_OTHER): Payer: Self-pay | Admitting: Emergency Medicine

## 2023-08-28 ENCOUNTER — Emergency Department (HOSPITAL_BASED_OUTPATIENT_CLINIC_OR_DEPARTMENT_OTHER)
Admission: EM | Admit: 2023-08-28 | Discharge: 2023-08-28 | Disposition: A | Discharge status: Home / Self Care | Attending: Emergency Medicine | Admitting: Emergency Medicine

## 2023-08-28 DIAGNOSIS — Z87891 Personal history of nicotine dependence: Secondary | ICD-10-CM | POA: Diagnosis not present

## 2023-08-28 DIAGNOSIS — R0789 Other chest pain: Secondary | ICD-10-CM | POA: Diagnosis present

## 2023-08-28 DIAGNOSIS — R7989 Other specified abnormal findings of blood chemistry: Secondary | ICD-10-CM | POA: Insufficient documentation

## 2023-08-28 DIAGNOSIS — R079 Chest pain, unspecified: Secondary | ICD-10-CM

## 2023-08-28 DIAGNOSIS — S22000A Wedge compression fracture of unspecified thoracic vertebra, initial encounter for closed fracture: Secondary | ICD-10-CM

## 2023-08-28 LAB — BASIC METABOLIC PANEL WITH GFR
Anion gap: 13 (ref 5–15)
BUN: 9 mg/dL (ref 8–23)
CO2: 22 mmol/L (ref 22–32)
Calcium: 9.3 mg/dL (ref 8.9–10.3)
Chloride: 108 mmol/L (ref 98–111)
Creatinine, Ser: 0.6 mg/dL (ref 0.44–1.00)
GFR, Estimated: >60 mL/min (ref >60)
Glucose, Bld: 150 mg/dL — ABNORMAL HIGH (ref 70–99)
Potassium: 3.6 mmol/L (ref 3.5–5.1)
Sodium: 143 mmol/L (ref 135–145)

## 2023-08-28 LAB — CBC
HCT: 43.1 % (ref 36.0–46.0)
Hemoglobin: 14.2 g/dL (ref 12.0–15.0)
MCH: 27.8 pg (ref 26.0–34.0)
MCHC: 32.9 g/dL (ref 30.0–36.0)
MCV: 84.5 fL (ref 80.0–100.0)
Platelets: 278 10*3/uL (ref 150–400)
RBC: 5.1 MIL/uL (ref 3.87–5.11)
RDW: 14.5 % (ref 11.5–15.5)
WBC: 11.2 10*3/uL — ABNORMAL HIGH (ref 4.0–10.5)
nRBC: 0 % (ref 0.0–0.2)

## 2023-08-28 LAB — TROPONIN T, HIGH SENSITIVITY
Troponin T High Sensitivity: <15 ng/L (ref <19)
Troponin T High Sensitivity: <15 ng/L (ref <19)

## 2023-08-28 LAB — D-DIMER, QUANTITATIVE: D-Dimer, Quant: 0.59 ug{FEU}/mL — ABNORMAL HIGH (ref 0.00–0.50)

## 2023-08-28 MED ORDER — IOHEXOL 350 MG/ML SOLN
75.0000 mL | Freq: Once | INTRAVENOUS | Status: AC | PRN
  Administered 2023-08-28: 75 mL via INTRAVENOUS

## 2023-08-28 MED ORDER — METHOCARBAMOL 500 MG PO TABS: 500.0000 mg | ORAL_TABLET | Freq: Two times a day (BID) | ORAL | 0 refills | Status: DC | PRN

## 2023-08-28 MED ORDER — IPRATROPIUM-ALBUTEROL 0.5-2.5 (3) MG/3ML IN SOLN
3.0000 mL | Freq: Once | RESPIRATORY_TRACT | Status: AC
  Administered 2023-08-28: 3 mL via RESPIRATORY_TRACT
  Filled 2023-08-28: qty 3

## 2023-08-28 MED ORDER — OMEPRAZOLE 40 MG PO CPDR: 40.0000 mg | DELAYED_RELEASE_CAPSULE | Freq: Every day | ORAL | 0 refills | Status: AC

## 2023-08-28 NOTE — ED Provider Notes (Signed)
 [facility] Provider Note   CSN: [id number] Arrival date & time: 08/28/23  1413     Patient presents with: Chest Pain   Jasmine [name] is a 68 y.o. female.   68 year old female presenting with burning chest pain.  Patient was diagnosed with COVID 6/22, for the last 4 days describes a burning sensation in her chest when taking a deep breath.  Her other COVID symptoms including cough/fever have resolved, but this burning sensation persist.  She describes burning with taking deep breath, bending over, and with exertion.  Denies shortness of breath, nausea/vomiting, abdominal pain.   Chest Pain      Prior to Admission medications   Medication Sig Start Date End Date Taking? Authorizing Provider  acetaminophen  (TYLENOL ) 325 MG tablet Take 2 tablets (650 mg total) by mouth every 6 (six) hours as needed for mild pain (pain score 1-3). 01/20/23   Elnor Hila P, DO  azelastine  (ASTELIN ) 0.1 % nasal spray Place 1 spray into both nostrils 2 (two) times daily. Use in each nostril as directed 05/19/20   Kip Ade, NP  azelastine  (ASTELIN ) 0.1 % nasal spray Place into the nose. 05/19/20   [provider]  B COMPLEX VITAMINS PO Take by mouth.    [provider]  benzonatate  (TESSALON ) 100 MG capsule Take 1 capsule (100 mg total) by mouth every 8 (eight) hours. 08/20/23   Barrett, Warren SAILOR, PA-C  ciprofloxacin  (CIPRO ) 500 MG tablet Take 1 tablet (500 mg total) by mouth every 12 (twelve) hours. 08/20/23   Barrett, Jamie N, PA-C  diclofenac  Sodium (VOLTAREN ) 1 % GEL Apply 2 g topically 4 (four) times daily. 08/21/20   Kip Ade, NP  Erenumab -aooe (AIMOVIG ) 140 MG/ML SOAJ Inject 140 mg into the skin every 30 (thirty) days. 05/10/23   Gayland Lauraine PARAS, NP  felodipine  (PLENDIL ) 10 MG 24 hr tablet Take 1 tablet (10 mg total) by mouth daily. 01/03/20   Kip Ade, NP  fluticasone  (FLONASE ) 50 MCG/ACT nasal spray Place 1 spray into  both nostrils daily. 08/20/23   Barrett, Warren SAILOR, PA-C  Fluticasone -Salmeterol (ADVAIR) 100-50 MCG/DOSE AEPB     [provider]  levalbuterol  (XOPENEX  HFA) 45 MCG/ACT inhaler Inhale 2 puffs into the lungs every 8 (eight) hours as needed for wheezing. 02/27/20   Just, Kelsea J, FNP  lidocaine  (LIDODERM ) 5 % Place 1 patch onto the skin daily. Remove & Discard patch within 12 hours or as directed by MD 03/16/23   Desiderio Chew, PA-C  losartan  (COZAAR ) 100 MG tablet TAKE ONE TABLET BY MOUTH DAILY Patient taking differently: 50 mg. 10/07/20   Kip Ade, NP  lubiprostone (AMITIZA) 8 MCG capsule TAKE ONE CAPSULE BY MOUTH TWICE A DAY WITH A MEAL 09/07/20   [provider]  meclizine  (ANTIVERT ) 25 MG tablet Take 1 tablet (25 mg total) by mouth 3 (three) times daily as needed for dizziness. 09/23/20   Jenel Carlin POUR, MD  Menthol (BIOFREEZE) 5 % [facility] Apply 1 patch topically as needed.    [provider]  metoprolol  succinate (TOPROL -XL) 25 MG 24 hr tablet TAKE ONE TABLET BY MOUTH DAILY Patient taking differently: 12.5 mg. 08/05/20   Kip Ade, NP  Multiple Vitamin (MULTIVITAMIN PO) Take by mouth.    [provider]  naproxen  (NAPROSYN ) 500 MG tablet Take 1 tablet (500 mg total) by mouth every 12 (twelve) hours as needed. 03/16/23   Desiderio Chew, PA-C  omeprazole  (PRILOSEC) 40 MG capsule  Take 1 capsule (40 mg total) by mouth daily. 01/03/20   Kip Ade, NP  ondansetron  (ZOFRAN -ODT) 4 MG disintegrating tablet Take 1 tablet (4 mg total) by mouth every 8 (eight) hours as needed for nausea or vomiting. 08/20/23   Barrett, Jamie N, PA-C  orphenadrine  (NORFLEX ) 100 MG tablet TAKE ONE TABLET BY MOUTH TWICE A DAY AS NEEDED FOR MUSCLE SPASMS 11/05/20   Kip Ade, NP  oxyCODONE  (OXY IR/ROXICODONE ) 5 MG immediate release tablet Take 1 tablet (5 mg total) by mouth every 6 (six) hours as needed for severe pain (pain score 7-10). 03/16/23   Geiple, Joshua, PA-C   pregabalin  (LYRICA ) 200 MG capsule Take 1 capsule (200 mg total) by mouth 3 (three) times daily. 01/14/21   Onita Duos, MD  rizatriptan  (MAXALT ) 10 MG tablet Take one tablet up to three times a day if needed 05/10/23   Gayland Lauraine PARAS, NP  rosuvastatin  (CRESTOR ) 20 MG tablet TAKE ONE TABLET BY MOUTH EVERY EVENING 10/20/20   Kip Ade, NP  temazepam  (RESTORIL ) 15 MG capsule 30 mg.    [provider]  traZODone (DESYREL) 50 MG tablet Take 50 mg by mouth at bedtime. 09/19/20   [provider]  zolpidem  (AMBIEN ) 10 MG tablet Take 2 tablets by mouth at bedtime.  04/06/16   [provider]    Allergies: Amlodipine, Clonidine , Diclofenac , Hydrochlorothiazide, Sulfa antibiotics, Famciclovir, Metronidazole, Soma  [carisoprodol ], Acyclovir, Acyclovir and related, Benadryl [diphenhydramine hcl], Cephalosporins, Codeine, Cyclobenzaprine , Cymbalta  [duloxetine  hcl], Diclofenac  sodium, Dicyclomine, Hydrocodone , Hydrocodone -acetaminophen , Linzess [linaclotide], Nitrofurantoin monohyd macro, Suboxone [buprenorphine hcl-naloxone hcl], Tizanidine, Tramadol , Baclofen, Drug ingredient [ganciclovir], Meloxicam, and Sulfamethoxazole    Review of Systems  Cardiovascular:  Positive for chest pain.    Updated Vital Signs  Vitals:   08/28/23 1630 08/28/23 1645 08/28/23 1700 08/28/23 1809  BP: 125/61 132/66 (!) 140/68 135/65  Pulse: 70 64 67 70  Resp:   18 17  Temp:    98.4 F (36.9 C)  TempSrc:    Oral  SpO2: 98% 97% 98% 98%  Weight:      Height:         Physical Exam Vitals and nursing note reviewed.  HENT:     Head: Normocephalic.   Eyes:     Extraocular Movements: Extraocular movements intact.    Cardiovascular:     Rate and Rhythm: Normal rate and regular rhythm.     Heart sounds: Normal heart sounds.  Pulmonary:     Effort: Pulmonary effort is normal.     Breath sounds: Wheezing (expiratory, RUL) present.   Musculoskeletal:     Cervical back: Normal range of  motion.     Right lower leg: No edema.     Left lower leg: No edema.     Comments: Moves all extremities spontaneously without difficulty   Skin:    General: Skin is warm and dry.   Neurological:     Mental Status: She is alert and oriented to person, place, and time.     (all labs ordered are listed, but only abnormal results are displayed) Labs Reviewed  BASIC METABOLIC PANEL WITH GFR - Abnormal; Notable for the following components:      Result Value   Glucose, Bld 150 (*)    All other components within normal limits  CBC - Abnormal; Notable for the following components:   WBC 11.2 (*)    All other components within normal limits  D-DIMER, QUANTITATIVE - Abnormal; Notable for the following components:   D-Dimer,  Quant 0.59 (*)    All other components within normal limits  TROPONIN T, HIGH SENSITIVITY  TROPONIN T, HIGH SENSITIVITY    EKG: EKG Interpretation Date/Time:  Monday August 28 2023 14:25:59 EDT Ventricular Rate:  87 PR Interval:  149 QRS Duration:  93 QT Interval:  366 QTC Calculation: 441 R Axis:   31  Text Interpretation: Sinus rhythm Borderline T abnormalities, anterior leads Confirmed by Darra Chew [phone]) on 08/28/2023 3:52:52 PM  Radiology: CT Angio Chest PE W and/or Wo Contrast Result Date: 08/28/2023 CLINICAL DATA:  Burning sensation while breathing x4 days. EXAM: CT ANGIOGRAPHY CHEST WITH CONTRAST TECHNIQUE: Multidetector CT imaging of the chest was performed using the standard protocol during bolus administration of intravenous contrast. Multiplanar CT image reconstructions and MIPs were obtained to evaluate the vascular anatomy. RADIATION DOSE REDUCTION: This exam was performed according to the departmental dose-optimization program which includes automated exposure control, adjustment of the mA and/or kV according to patient size and/or use of iterative reconstruction technique. CONTRAST:  75mL OMNIPAQUE  IOHEXOL  350 MG/ML SOLN COMPARISON:  February 25, 2023 FINDINGS: Cardiovascular: There is mild calcification of the aortic arch, without evidence of aortic aneurysm. Satisfactory opacification of the pulmonary arteries to the segmental level. No evidence of pulmonary embolism. Normal heart size. No pericardial effusion. Mediastinum/Nodes: No enlarged mediastinal, hilar, or axillary lymph nodes. Thyroid  gland, trachea, and esophagus demonstrate no significant findings. Lungs/Pleura: There is mild lingular and posterior bibasilar scarring and/or atelectasis. No acute infiltrate, pleural effusion or pneumothorax is identified. Upper Abdomen: No acute abnormality. Musculoskeletal: Compression fracture deformities of the T6, T7 and T9 vertebral bodies are seen. While these are of indeterminate age, the represent a new finding when compared to the prior study. Review of the MIP images confirms the above findings. IMPRESSION: 1. No evidence of pulmonary embolism or other acute intrathoracic process. 2. Compression fracture deformities of the T6, T7 and T9 vertebral bodies, of indeterminate age. 3. Aortic atherosclerosis. Electronically Signed   By: Suzen Dials M.D.   On: 08/28/2023 18:09   DG Chest 2 View Result Date: 08/28/2023 CLINICAL DATA:  Chest pain. EXAM: CHEST - 2 VIEW COMPARISON:  Chest radiograph dated 08/20/2023. FINDINGS: The heart size and mediastinal contours are within normal limits. Both lungs are clear. The visualized skeletal structures are unremarkable. IMPRESSION: No active cardiopulmonary disease. Electronically Signed   By: Vanetta Chou M.D.   On: 08/28/2023 15:15     Procedures   Medications Ordered in the ED  ipratropium-albuterol  (DUONEB) 0.5-2.5 (3) MG/3ML nebulizer solution 3 mL (3 mLs Nebulization Given 08/28/23 1601)  iohexol  (OMNIPAQUE ) 350 MG/ML injection 75 mL (75 mLs Intravenous Contrast Given 08/28/23 1719)                                    Medical Decision Making This patient presents to the ED for concern of  burning chest pain, this involves an extensive number of treatment options, and is a complaint that carries with it a high risk of complications and morbidity.  The differential diagnosis includes ACS, GERD, PE, pneumonia, post-COVID symptoms.    Co morbidities that complicate the patient evaluation  Recent COVID infection   Additional history obtained:  Additional history obtained from record review External records from outside source obtained and reviewed including prior emergency department note   Lab Tests:  I Ordered, and personally interpreted labs.  The pertinent results include: CBC notable  for mild leukocytosis of 11.2, BMP unremarkable.  Initial troponin within normal limits, delta troponin remains unchanged.  D-dimer elevated at 0.59   Imaging Studies ordered:  I ordered imaging studies including chest x-ray, CT PE study I independently visualized and interpreted imaging which showed  - CXR: No active cardiopulmonary disease. - CTA PE study: 1. No evidence of pulmonary embolism or other acute intrathoracic process. 2. Compression fracture deformities of the T6, T7 and T9 vertebral bodies, of indeterminate age. 3. Aortic atherosclerosis.  I agree with the radiologist interpretation   Cardiac Monitoring: / EKG:  The patient was maintained on a cardiac monitor.  I personally viewed and interpreted the cardiac monitored which showed an underlying rhythm of: NSR   Problem List / ED Course / Critical interventions / Medication management  I ordered medication including DuoNeb  for wheezing  Reevaluation of the patient after these medicines showed that the patient improved I have reviewed the patients home medicines and have made adjustments as needed   Social Determinants of Health:  Former tobacco use, housing instability   Test / Admission - Considered:  Physical exam notable for mild expiratory wheeze in the right upper lobe, otherwise unremarkable exam.   Wheezing did improve following DuoNeb, however patient's symptoms persist.  Patient's D-dimer was elevated at 0.59, this is below the age-adjusted D-dimer cutoff given her age however given patient's symptoms I did elect to proceed with CTA PE study.  See above for interpretation.  Notable findings on CT imaging include multiple compression fractions of thoracic vertebrae, however upon chart review this is not a new finding and patient has been seen and evaluated for this issue previously.  No pulmonary embolism noted on CTA, I have low suspicion that pulmonary embolism is contributing to patient's symptoms.  I suspect that patient's pain is likely musculoskeletal in nature, I will prescribe Robaxin  to be used as needed for muscle spasms, I advised patient that this medication may cause drowsiness/fatigue and should be used only on an as-needed basis and preferably prior to bedtime.  I encouraged the patient to continue Tylenol  as needed for pain and albuterol  as needed for wheezing.  I will also prescribe omeprazole , as this burning sensation may be secondary to GERD, she has been prescribed this medication in the past but reports that she has not taken it in a long time.  I advised patient to follow-up with her primary care provider if her symptoms persist.  She is appropriate discharge at this time, return precautions discussed.    Amount and/or Complexity of Data Reviewed Labs: ordered. Radiology: ordered.  Risk Prescription drug management.        Final diagnoses:  Chest pain, unspecified type  Elevated d-dimer    ED Discharge Orders          Ordered    omeprazole  (PRILOSEC) 40 MG capsule  Daily        08/28/23 1824    methocarbamol  (ROBAXIN ) 500 MG tablet  2 times daily PRN        08/28/23 1824               Glendia Rocky SAILOR, PA-C 08/28/23 1834    Long, Joshua G, MD 08/28/23 1946

## 2023-08-28 NOTE — ED Notes (Signed)
 Patient transported to CT

## 2023-08-28 NOTE — ED Notes (Signed)
 Patient returned from CT

## 2023-08-28 NOTE — ED Triage Notes (Signed)
 Pt POV c/o burning sensation while breathing x4 days, progressively worsening.   Recently tested + for COVID 6/22.  Denies fever.   Denies smoking, copd.

## 2023-08-28 NOTE — Discharge Instructions (Addendum)
 Follow-up with your primary care provider if your symptoms persist, return to the emergency department if your symptoms worsen.  Start Robaxin , take 1 tablet by mouth twice daily as needed for chest discomfort.  You may experience fatigue/drowsiness while taking Robaxin , use only as needed and preferably at night prior to bedtime.  Continue Tylenol  as needed for pain.  Start omeprazole , 1 tablet by mouth daily.

## 2023-08-29 ENCOUNTER — Telehealth (HOSPITAL_BASED_OUTPATIENT_CLINIC_OR_DEPARTMENT_OTHER): Payer: Self-pay | Admitting: Emergency Medicine

## 2023-08-29 ENCOUNTER — Telehealth: Payer: Self-pay | Admitting: Neurology

## 2023-08-29 MED ORDER — ORPHENADRINE CITRATE ER 100 MG PO TB12: 100.0000 mg | ORAL_TABLET | Freq: Two times a day (BID) | ORAL | 0 refills | Status: AC

## 2023-08-29 NOTE — Telephone Encounter (Signed)
 Actually patient does not need this appointment this week. She was seen in March 2025 and has an 1 year follow up scheduled. Okay to cancel this week visit. Thanks

## 2023-08-29 NOTE — Telephone Encounter (Signed)
 Pt called wanting to know if her upcoming appt can be done as a mychart visit.

## 2023-08-29 NOTE — Telephone Encounter (Signed)
 Patient went to the pharmacy and realized that the medicine sent for her she does not react well with.  She was asking for a change of her prescription.

## 2023-08-30 NOTE — Telephone Encounter (Signed)
 Lmtrc 1st attempt

## 2023-08-31 ENCOUNTER — Telehealth: Payer: Self-pay

## 2023-08-31 ENCOUNTER — Ambulatory Visit: Payer: 59 | Admitting: Neurology

## 2023-08-31 NOTE — Telephone Encounter (Signed)
 Call to patient, she is aware appt is not needed today and in agreement

## 2023-10-05 ENCOUNTER — Emergency Department (HOSPITAL_BASED_OUTPATIENT_CLINIC_OR_DEPARTMENT_OTHER)

## 2023-10-05 ENCOUNTER — Emergency Department (HOSPITAL_BASED_OUTPATIENT_CLINIC_OR_DEPARTMENT_OTHER)
Admission: EM | Admit: 2023-10-05 | Discharge: 2023-10-05 | Disposition: A | Discharge status: Home / Self Care | Source: Ambulatory Visit

## 2023-10-05 ENCOUNTER — Encounter (HOSPITAL_BASED_OUTPATIENT_CLINIC_OR_DEPARTMENT_OTHER): Payer: Self-pay

## 2023-10-05 ENCOUNTER — Other Ambulatory Visit: Payer: Self-pay

## 2023-10-05 DIAGNOSIS — Z8673 Personal history of transient ischemic attack (TIA), and cerebral infarction without residual deficits: Secondary | ICD-10-CM | POA: Diagnosis not present

## 2023-10-05 DIAGNOSIS — R079 Chest pain, unspecified: Secondary | ICD-10-CM | POA: Diagnosis present

## 2023-10-05 DIAGNOSIS — Z79899 Other long term (current) drug therapy: Secondary | ICD-10-CM | POA: Diagnosis not present

## 2023-10-05 DIAGNOSIS — R911 Solitary pulmonary nodule: Secondary | ICD-10-CM | POA: Diagnosis not present

## 2023-10-05 DIAGNOSIS — I10 Essential (primary) hypertension: Secondary | ICD-10-CM | POA: Diagnosis not present

## 2023-10-05 LAB — D-DIMER, QUANTITATIVE: D-Dimer, Quant: 0.43 ug{FEU}/mL (ref 0.00–0.50)

## 2023-10-05 LAB — CBC
HCT: 44.8 % (ref 36.0–46.0)
Hemoglobin: 14.6 g/dL (ref 12.0–15.0)
MCH: 27.9 pg (ref 26.0–34.0)
MCHC: 32.6 g/dL (ref 30.0–36.0)
MCV: 85.7 fL (ref 80.0–100.0)
Platelets: 240 K/uL (ref 150–400)
RBC: 5.23 MIL/uL — ABNORMAL HIGH (ref 3.87–5.11)
RDW: 14.2 % (ref 11.5–15.5)
WBC: 6.4 K/uL (ref 4.0–10.5)
nRBC: 0 % (ref 0.0–0.2)

## 2023-10-05 LAB — BASIC METABOLIC PANEL WITH GFR
Anion gap: 13 (ref 5–15)
BUN: 14 mg/dL (ref 8–23)
CO2: 23 mmol/L (ref 22–32)
Calcium: 9.6 mg/dL (ref 8.9–10.3)
Chloride: 105 mmol/L (ref 98–111)
Creatinine, Ser: 0.53 mg/dL (ref 0.44–1.00)
GFR, Estimated: >60 mL/min (ref >60)
Glucose, Bld: 104 mg/dL — ABNORMAL HIGH (ref 70–99)
Potassium: 4 mmol/L (ref 3.5–5.1)
Sodium: 141 mmol/L (ref 135–145)

## 2023-10-05 LAB — TROPONIN T, HIGH SENSITIVITY: Troponin T High Sensitivity: <15 ng/L (ref <19)

## 2023-10-05 MED ORDER — KETOROLAC TROMETHAMINE 10 MG PO TABS: 10.0000 mg | ORAL_TABLET | Freq: Four times a day (QID) | ORAL | 0 refills | Status: AC | PRN

## 2023-10-05 MED ORDER — OXYCODONE-ACETAMINOPHEN 5-325 MG PO TABS
1.0000 | ORAL_TABLET | Freq: Four times a day (QID) | ORAL | 0 refills | Status: AC | PRN
Start: 2023-10-05 — End: ?

## 2023-10-05 MED ORDER — IOHEXOL 350 MG/ML SOLN
100.0000 mL | Freq: Once | INTRAVENOUS | Status: AC | PRN
  Administered 2023-10-05: 80 mL via INTRAVENOUS

## 2023-10-05 MED ORDER — KETOROLAC TROMETHAMINE 15 MG/ML IJ SOLN
15.0000 mg | Freq: Once | INTRAMUSCULAR | Status: AC
  Administered 2023-10-05: 15 mg via INTRAVENOUS
  Filled 2023-10-05: qty 1

## 2023-10-05 MED ORDER — MORPHINE SULFATE (PF) 4 MG/ML IV SOLN
4.0000 mg | Freq: Once | INTRAVENOUS | Status: AC
  Administered 2023-10-05: 4 mg via INTRAVENOUS
  Filled 2023-10-05: qty 1

## 2023-10-05 MED ORDER — ONDANSETRON 4 MG PO TBDP: 4.0000 mg | ORAL_TABLET | Freq: Three times a day (TID) | ORAL | 0 refills | Status: AC | PRN

## 2023-10-05 MED ORDER — ONDANSETRON HCL 4 MG/2ML IJ SOLN
4.0000 mg | Freq: Once | INTRAMUSCULAR | Status: AC
  Administered 2023-10-05: 4 mg via INTRAVENOUS
  Filled 2023-10-05: qty 2

## 2023-10-05 NOTE — Discharge Instructions (Addendum)
 Your CT scan and other workup today was reassuring.  Please take the Toradol  as needed for pain.  If you are still having pain is okay to take the Percocet.  Do not drive or drink alcohol while taking the Percocet as it may make you drowsy.  I have also provided you a prescription for Zofran  to take as needed.  Please follow-up with your doctor as you did have a lung nodule on your CT scan.  They may want to repeat a CT scan at some point in the future to monitor this.  Return to the emergency department for worsening symptoms.

## 2023-10-05 NOTE — ED Triage Notes (Signed)
 Reports int. CP w/SOB x4-5days, referred to ED by cardiologist.

## 2023-10-05 NOTE — ED Provider Notes (Signed)
 [place] [facility] AT [facility] Provider Note   CSN: [id number] Arrival date & time: 10/05/23  1014     Patient presents with: Chest Pain   Jasmine [name] is a 68 y.o. female.   68 year old female with past medical history of hypertension, hyperlipidemia, and TIA in the past presenting to the emergency department today with chest pain.  The patient states she is have been having left-sided chest pain now over the past few days.  Reports that it is worse with movement or with exertion.  The patient denies any associated nausea or diaphoresis.  Reports the pain is sharp.  The pain is nonpleuritic.  She denies any hemoptysis.  Denies any leg pain or swelling.  She denies a history of DVT or pulmonary embolism, recent surgeries, recent travel.  She came to the emergency department today after calling her cardiologist to referred her to the emergency department for further evaluation.   Chest Pain      Prior to Admission medications   Medication Sig Start Date End Date Taking? Authorizing Provider  ketorolac  (TORADOL ) 10 MG tablet Take 1 tablet (10 mg total) by mouth every 6 (six) hours as needed. 10/05/23  Yes Ula Prentice SAUNDERS, MD  ondansetron  (ZOFRAN -ODT) 4 MG disintegrating tablet Take 1 tablet (4 mg total) by mouth every 8 (eight) hours as needed for nausea or vomiting. 10/05/23  Yes Ula Prentice SAUNDERS, MD  oxyCODONE -acetaminophen  (PERCOCET/ROXICET) 5-325 MG tablet Take 1 tablet by mouth every 6 (six) hours as needed for severe pain (pain score 7-10). 10/05/23  Yes Ula Prentice SAUNDERS, MD  acetaminophen  (TYLENOL ) 325 MG tablet Take 2 tablets (650 mg total) by mouth every 6 (six) hours as needed for mild pain (pain score 1-3). 01/20/23   Elnor Hila P, DO  azelastine  (ASTELIN ) 0.1 % nasal spray Place 1 spray into both nostrils 2 (two) times daily. Use in each nostril as directed 05/19/20   Kip Ade, NP  azelastine  (ASTELIN ) 0.1 % nasal spray Place into the nose. 05/19/20    [provider]  B COMPLEX VITAMINS PO Take by mouth.    [provider]  benzonatate  (TESSALON ) 100 MG capsule Take 1 capsule (100 mg total) by mouth every 8 (eight) hours. 08/20/23   Barrett, Jamie N, PA-C  ciprofloxacin  (CIPRO ) 500 MG tablet Take 1 tablet (500 mg total) by mouth every 12 (twelve) hours. 08/20/23   Barrett, Jamie N, PA-C  diclofenac  Sodium (VOLTAREN ) 1 % GEL Apply 2 g topically 4 (four) times daily. 08/21/20   Kip Ade, NP  Erenumab -aooe (AIMOVIG ) 140 MG/ML SOAJ Inject 140 mg into the skin every 30 (thirty) days. 05/10/23   Gayland Lauraine PARAS, NP  felodipine  (PLENDIL ) 10 MG 24 hr tablet Take 1 tablet (10 mg total) by mouth daily. 01/03/20   Kip Ade, NP  fluticasone  (FLONASE ) 50 MCG/ACT nasal spray Place 1 spray into both nostrils daily. 08/20/23   Barrett, Jamie N, PA-C  Fluticasone -Salmeterol (ADVAIR) 100-50 MCG/DOSE AEPB     [provider]  levalbuterol  (XOPENEX  HFA) 45 MCG/ACT inhaler Inhale 2 puffs into the lungs every 8 (eight) hours as needed for wheezing. 02/27/20   Just, Kelsea J, FNP  lidocaine  (LIDODERM ) 5 % Place 1 patch onto the skin daily. Remove & Discard patch within 12 hours or as directed by MD 03/16/23   Desiderio Chew, PA-C  losartan  (COZAAR ) 100 MG tablet TAKE ONE TABLET BY MOUTH DAILY Patient taking differently: 50 mg. 10/07/20   Kip Ade,  NP  lubiprostone (AMITIZA) 8 MCG capsule TAKE ONE CAPSULE BY MOUTH TWICE A DAY WITH A MEAL 09/07/20   [provider]  meclizine  (ANTIVERT ) 25 MG tablet Take 1 tablet (25 mg total) by mouth 3 (three) times daily as needed for dizziness. 09/23/20   Jenel Carlin POUR, MD  Menthol (BIOFREEZE) 5 % [facility] Apply 1 patch topically as needed.    [provider]  metoprolol  succinate (TOPROL -XL) 25 MG 24 hr tablet TAKE ONE TABLET BY MOUTH DAILY Patient taking differently: 12.5 mg. 08/05/20   Kip Ade, NP  Multiple Vitamin (MULTIVITAMIN PO) Take by mouth.    [provider]  naproxen  (NAPROSYN ) 500 MG tablet Take 1 tablet (500 mg total) by mouth every 12 (twelve) hours as needed. 03/16/23   Desiderio Chew, PA-C  omeprazole  (PRILOSEC) 40 MG capsule Take 1 capsule (40 mg total) by mouth daily. 08/28/23   Glendia Rocky SAILOR, PA-C  ondansetron  (ZOFRAN -ODT) 4 MG disintegrating tablet Take 1 tablet (4 mg total) by mouth every 8 (eight) hours as needed for nausea or vomiting. 08/20/23   Barrett, Jamie N, PA-C  orphenadrine  (NORFLEX ) 100 MG tablet Take 1 tablet (100 mg total) by mouth 2 (two) times daily. 08/29/23   Emil Share, DO  oxyCODONE  (OXY IR/ROXICODONE ) 5 MG immediate release tablet Take 1 tablet (5 mg total) by mouth every 6 (six) hours as needed for severe pain (pain score 7-10). 03/16/23   Geiple, Joshua, PA-C  pregabalin  (LYRICA ) 200 MG capsule Take 1 capsule (200 mg total) by mouth 3 (three) times daily. 01/14/21   Onita Duos, MD  rizatriptan  (MAXALT ) 10 MG tablet Take one tablet up to three times a day if needed 05/10/23   Gayland Lauraine PARAS, NP  rosuvastatin  (CRESTOR ) 20 MG tablet TAKE ONE TABLET BY MOUTH EVERY EVENING 10/20/20   Kip Ade, NP  temazepam  (RESTORIL ) 15 MG capsule 30 mg.    [provider]  traZODone (DESYREL) 50 MG tablet Take 50 mg by mouth at bedtime. 09/19/20   [provider]  zolpidem  (AMBIEN ) 10 MG tablet Take 2 tablets by mouth at bedtime.  04/06/16   [provider]    Allergies: Amlodipine, Clonidine , Diclofenac , Hydrochlorothiazide, Sulfa antibiotics, Famciclovir, Metronidazole, Soma  [carisoprodol ], Acyclovir, Acyclovir and related, Benadryl [diphenhydramine hcl], Cephalosporins, Codeine, Cyclobenzaprine , Cymbalta  [duloxetine  hcl], Diclofenac  sodium, Dicyclomine, Hydrocodone , Hydrocodone -acetaminophen , Linzess [linaclotide], Nitrofurantoin monohyd macro, Suboxone [buprenorphine hcl-naloxone hcl], Tizanidine, Tramadol , Baclofen, Drug ingredient [ganciclovir], Meloxicam, and Sulfamethoxazole    Review of Systems   Cardiovascular:  Positive for chest pain.  All other systems reviewed and are negative.   Updated Vital Signs BP 133/73   Pulse 65   Temp 98.8 F (37.1 C) (Oral)   Resp 15   Ht 5' 2 (1.575 m)   Wt 77.3 kg   SpO2 92%   BMI 31.18 kg/m   Physical Exam Vitals and nursing note reviewed.   Gen: NAD Eyes: PERRL, EOMI HEENT: no oropharyngeal swelling Neck: trachea midline Resp: clear to auscultation bilaterally Card: RRR, no murmurs, rubs, or gallops, the patient does have tenderness over the sternum when placing the stethoscope on her sternum and applying minimal pressure Abd: nontender, nondistended Extremities: no calf tenderness, no edema Vascular: 2+ radial pulses bilaterally, 2+ DP pulses bilaterally Skin: no rashes Psyc: acting appropriately   (all labs ordered are listed, but only abnormal results are displayed) Labs Reviewed  BASIC METABOLIC PANEL WITH GFR - Abnormal; Notable for the following components:      Result Value  Glucose, Bld 104 (*)    All other components within normal limits  CBC - Abnormal; Notable for the following components:   RBC 5.23 (*)    All other components within normal limits  D-DIMER, QUANTITATIVE  TROPONIN T, HIGH SENSITIVITY    EKG: EKG Interpretation Date/Time:  Thursday October 05 2023 10:30:58 EDT Ventricular Rate:  77 PR Interval:  159 QRS Duration:  91 QT Interval:  391 QTC Calculation: 443 R Axis:   45  Text Interpretation: Sinus rhythm Confirmed by Ula Barter [phone]) on 10/05/2023 11:43:08 AM  Radiology: CT Angio Chest Aorta W and/or Wo Contrast Result Date: 10/05/2023 CLINICAL DATA:  Chest pain.  Acute aortic syndrome suspected. EXAM: CT ANGIOGRAPHY CHEST WITH CONTRAST TECHNIQUE: Multidetector CT imaging of the chest was performed using the standard protocol during bolus administration of intravenous contrast. Multiplanar CT image reconstructions and MIPs were obtained to evaluate the vascular anatomy. RADIATION DOSE  REDUCTION: This exam was performed according to the departmental dose-optimization program which includes automated exposure control, adjustment of the mA and/or kV according to patient size and/or use of iterative reconstruction technique. CONTRAST:  80mL OMNIPAQUE  IOHEXOL  350 MG/ML SOLN COMPARISON:  Chest radiograph dated 10/05/2023 and CT dated 08/28/2023. FINDINGS: Cardiovascular: There is no cardiomegaly or pericardial effusion. Mild atherosclerotic calcification of the thoracic aorta. No aneurysmal dilatation or dissection. The origins of the great vessels of the aortic arch are patent. No pulmonary artery embolus identified. Mediastinum/Nodes: Mildly enlarged subcarinal lymph node measures 10 mm short axis. Top-normal right hilar lymph nodes. The esophagus is grossly unremarkable. No mediastinal fluid collection. Lungs/Pleura: Small evidence atelectasis/scarring in the lingula. There is a 3 mm left lower lobe nodule along the fissure (64/12). No pleural effusion pneumothorax. The central airways are patent. Upper Abdomen: No acute abnormality. Musculoskeletal: Degenerative changes of the spine. Similar appearance of compression fractures of the midthoracic spine predominantly involving T6 and T9 with anterior wedging. There is disc desiccation and vacuum phenomena, progressed since the prior CT. No new osseous pathology. Review of the MIP images confirms the above findings. IMPRESSION: 1. No acute intrathoracic pathology. No aortic dissection or aneurysm. No pulmonary artery embolus. 2. A 3 mm left lower lobe nodule. No follow-up needed if patient is low-risk.This recommendation follows the consensus statement: Guidelines for Management of Incidental Pulmonary Nodules Detected on CT Images: From the Fleischner Society 2017; Radiology 2017; 284:228-243. 3. Similar appearance of compression fractures of the midthoracic spine predominantly involving T6 and T9 with anterior wedging. 4.  Aortic Atherosclerosis  (ICD10-I70.0). Electronically Signed   By: Vanetta Chou M.D.   On: 10/05/2023 14:56   DG Chest 2 View Result Date: 10/05/2023 CLINICAL DATA:  Chest pain. EXAM: CHEST - 2 VIEW COMPARISON:  08/28/2023 FINDINGS: The heart size and mediastinal contours are within normal limits. Both lungs are clear. Several midthoracic vertebral body compression fracture deformities are again seen. IMPRESSION: No active cardiopulmonary disease. Electronically Signed   By: Norleen DELENA Kil M.D.   On: 10/05/2023 12:11     Procedures   Medications Ordered in the ED  morphine  (PF) 4 MG/ML injection 4 mg (4 mg Intravenous Given 10/05/23 1119)  ondansetron  (ZOFRAN ) injection 4 mg (4 mg Intravenous Given 10/05/23 1120)  ketorolac  (TORADOL ) 15 MG/ML injection 15 mg (15 mg Intravenous Given 10/05/23 1301)  iohexol  (OMNIPAQUE ) 350 MG/ML injection 100 mL (80 mLs Intravenous Contrast Given 10/05/23 1424)  Medical Decision Making 68 year old female with past medical history of hypertension, hyperlipidemia, and TIA in the past presenting to the emergency department today with left-sided chest pain.  I will further evaluate patient here with basic lab as well as an EKG, chest x-ray, troponin to evaluate for pulmonary edema, pulmonary infiltrates, or pneumothorax well as an EKG and troponin to evaluate for ACS, myocarditis, or pericarditis.  I will give patient morphine  and Zofran  for her symptoms.  May be due to costochondritis which would be a diagnosis of exclusion.  With reassuring vital signs with no tachycardia and SpO2 of 96% no reports of shortness of breath or pleuritic pain and no findings concerning for DVT suspicion for pulmonary embolism is low at this time.  I will reevaluate for ultimate disposition.  The patient's initial troponin and D-dimer were negative.  On reassessment the patient was still having pain.  She is given Toradol .  CT angiogram is ordered to eval for aortic pathology.  This  was negative.  There was a pulmonary nodule.  Patient is encouraged to follow-up with her primary care provider regarding this.  She is discharged with return precautions with cardiology follow-up.  Amount and/or Complexity of Data Reviewed Labs: ordered. Radiology: ordered.  Risk Prescription drug management.        Final diagnoses:  Pulmonary nodule  Chest pain, unspecified type    ED Discharge Orders          Ordered    ketorolac  (TORADOL ) 10 MG tablet  Every 6 hours PRN        10/05/23 1505    oxyCODONE -acetaminophen  (PERCOCET/ROXICET) 5-325 MG tablet  Every 6 hours PRN        10/05/23 1505    ondansetron  (ZOFRAN -ODT) 4 MG disintegrating tablet  Every 8 hours PRN        10/05/23 1505    Ambulatory referral to Cardiology       Comments: If you have not heard from the Cardiology office within the next 72 hours please call [phone].   10/05/23 1505               Ula Prentice SAUNDERS, MD 10/05/23 [phone]

## 2024-05-15 ENCOUNTER — Telehealth: Admitting: Neurology
